# Patient Record
Sex: Male | Born: 1940
Health system: Southern US, Community
[De-identification: ages and names within clinical notes are randomized; demographics above are authoritative.]

## PROBLEM LIST (undated history)

## (undated) DIAGNOSIS — I2510 Atherosclerotic heart disease of native coronary artery without angina pectoris: Secondary | ICD-10-CM

## (undated) DIAGNOSIS — I8390 Asymptomatic varicose veins of unspecified lower extremity: Principal | ICD-10-CM

## (undated) DIAGNOSIS — N189 Chronic kidney disease, unspecified: Secondary | ICD-10-CM

## (undated) DIAGNOSIS — H9190 Unspecified hearing loss, unspecified ear: Secondary | ICD-10-CM

## (undated) DIAGNOSIS — M1990 Unspecified osteoarthritis, unspecified site: Secondary | ICD-10-CM

## (undated) DIAGNOSIS — K219 Gastro-esophageal reflux disease without esophagitis: Secondary | ICD-10-CM

## (undated) DIAGNOSIS — E785 Hyperlipidemia, unspecified: Secondary | ICD-10-CM

## (undated) DIAGNOSIS — I10 Essential (primary) hypertension: Secondary | ICD-10-CM

## (undated) DIAGNOSIS — B029 Zoster without complications: Secondary | ICD-10-CM

## (undated) DIAGNOSIS — I4891 Unspecified atrial fibrillation: Secondary | ICD-10-CM

## (undated) DIAGNOSIS — H269 Unspecified cataract: Secondary | ICD-10-CM

## (undated) DIAGNOSIS — C61 Malignant neoplasm of prostate: Secondary | ICD-10-CM

## (undated) DIAGNOSIS — Z87442 Personal history of urinary calculi: Secondary | ICD-10-CM

## (undated) DIAGNOSIS — Z951 Presence of aortocoronary bypass graft: Secondary | ICD-10-CM

## (undated) DIAGNOSIS — H409 Unspecified glaucoma: Secondary | ICD-10-CM

## (undated) DIAGNOSIS — Z972 Presence of dental prosthetic device (complete) (partial): Secondary | ICD-10-CM

## (undated) HISTORY — PX: COLONOSCOPY: SHX174

## (undated) HISTORY — PX: OTHER SURGICAL HISTORY: SHX169

## (undated) HISTORY — PX: TONSILLECTOMY: SUR1361

## (undated) HISTORY — PX: CATARACT EXTRACTION: SUR2

## (undated) HISTORY — PX: WISDOM TOOTH EXTRACTION: SHX21

## (undated) HISTORY — PX: PROSTATECTOMY: SHX69

## (undated) HISTORY — DX: Presence of aortocoronary bypass graft: Z95.1

## (undated) HISTORY — DX: Asymptomatic varicose veins of unspecified lower extremity: I83.90

---

## 1997-12-05 ENCOUNTER — Encounter: Admission: RE | Admit: 1997-12-05 | Discharge: 1998-03-05 | Payer: Self-pay | Admitting: *Deleted

## 1997-12-08 ENCOUNTER — Encounter: Admission: RE | Admit: 1997-12-08 | Discharge: 1998-03-08 | Payer: Self-pay | Admitting: *Deleted

## 1998-09-29 ENCOUNTER — Other Ambulatory Visit: Admission: RE | Admit: 1998-09-29 | Discharge: 1998-09-29 | Payer: Self-pay | Admitting: Urology

## 1998-11-27 ENCOUNTER — Encounter: Payer: Self-pay | Admitting: Urology

## 1998-11-29 ENCOUNTER — Inpatient Hospital Stay (HOSPITAL_COMMUNITY): Admission: RE | Admit: 1998-11-29 | Discharge: 1998-12-02 | Payer: Self-pay | Admitting: Urology

## 1999-11-16 ENCOUNTER — Encounter: Payer: Self-pay | Admitting: Family Medicine

## 1999-11-16 ENCOUNTER — Encounter: Admission: RE | Admit: 1999-11-16 | Discharge: 1999-11-16 | Payer: Self-pay | Admitting: Family Medicine

## 2001-07-28 ENCOUNTER — Ambulatory Visit (HOSPITAL_COMMUNITY): Admission: RE | Admit: 2001-07-28 | Discharge: 2001-07-29 | Payer: Self-pay | Admitting: Ophthalmology

## 2001-07-28 ENCOUNTER — Encounter: Payer: Self-pay | Admitting: Ophthalmology

## 2001-09-07 ENCOUNTER — Encounter: Payer: Self-pay | Admitting: Family Medicine

## 2001-09-07 ENCOUNTER — Encounter: Admission: RE | Admit: 2001-09-07 | Discharge: 2001-09-07 | Payer: Self-pay | Admitting: Family Medicine

## 2001-10-07 ENCOUNTER — Ambulatory Visit (HOSPITAL_BASED_OUTPATIENT_CLINIC_OR_DEPARTMENT_OTHER): Admission: RE | Admit: 2001-10-07 | Discharge: 2001-10-07 | Payer: Self-pay | Admitting: *Deleted

## 2003-12-29 ENCOUNTER — Encounter: Admission: RE | Admit: 2003-12-29 | Discharge: 2003-12-29 | Payer: Self-pay | Admitting: Occupational Medicine

## 2004-01-20 ENCOUNTER — Ambulatory Visit (HOSPITAL_COMMUNITY): Admission: RE | Admit: 2004-01-20 | Discharge: 2004-01-20 | Payer: Self-pay | Admitting: Ophthalmology

## 2005-05-24 ENCOUNTER — Encounter: Admission: RE | Admit: 2005-05-24 | Discharge: 2005-05-24 | Payer: Self-pay | Admitting: Family Medicine

## 2005-06-13 ENCOUNTER — Emergency Department (HOSPITAL_COMMUNITY): Admission: EM | Admit: 2005-06-13 | Discharge: 2005-06-14 | Payer: Self-pay | Admitting: Emergency Medicine

## 2013-09-30 ENCOUNTER — Encounter (HOSPITAL_COMMUNITY): Payer: Self-pay | Admitting: Emergency Medicine

## 2013-09-30 ENCOUNTER — Emergency Department (HOSPITAL_COMMUNITY): Payer: Medicare Other

## 2013-09-30 ENCOUNTER — Observation Stay (HOSPITAL_COMMUNITY)
Admission: EM | Admit: 2013-09-30 | Discharge: 2013-10-01 | Disposition: A | Discharge status: Home / Self Care | Payer: Medicare Other | Attending: Internal Medicine | Admitting: Internal Medicine

## 2013-09-30 DIAGNOSIS — B029 Zoster without complications: Secondary | ICD-10-CM | POA: Insufficient documentation

## 2013-09-30 DIAGNOSIS — K219 Gastro-esophageal reflux disease without esophagitis: Secondary | ICD-10-CM | POA: Diagnosis present

## 2013-09-30 DIAGNOSIS — Z7982 Long term (current) use of aspirin: Secondary | ICD-10-CM | POA: Insufficient documentation

## 2013-09-30 DIAGNOSIS — H409 Unspecified glaucoma: Secondary | ICD-10-CM | POA: Insufficient documentation

## 2013-09-30 DIAGNOSIS — H269 Unspecified cataract: Secondary | ICD-10-CM | POA: Diagnosis not present

## 2013-09-30 DIAGNOSIS — Z79899 Other long term (current) drug therapy: Secondary | ICD-10-CM | POA: Diagnosis not present

## 2013-09-30 DIAGNOSIS — E785 Hyperlipidemia, unspecified: Secondary | ICD-10-CM | POA: Diagnosis not present

## 2013-09-30 DIAGNOSIS — I10 Essential (primary) hypertension: Secondary | ICD-10-CM | POA: Diagnosis present

## 2013-09-30 DIAGNOSIS — R55 Syncope and collapse: Principal | ICD-10-CM | POA: Diagnosis present

## 2013-09-30 DIAGNOSIS — IMO0002 Reserved for concepts with insufficient information to code with codable children: Secondary | ICD-10-CM | POA: Insufficient documentation

## 2013-09-30 HISTORY — DX: Zoster without complications: B02.9

## 2013-09-30 HISTORY — DX: Unspecified cataract: H26.9

## 2013-09-30 HISTORY — DX: Hyperlipidemia, unspecified: E78.5

## 2013-09-30 HISTORY — DX: Gastro-esophageal reflux disease without esophagitis: K21.9

## 2013-09-30 HISTORY — DX: Essential (primary) hypertension: I10

## 2013-09-30 HISTORY — DX: Unspecified glaucoma: H40.9

## 2013-09-30 LAB — CBC WITH DIFFERENTIAL/PLATELET
BASOS ABS: 0 10*3/uL (ref 0.0–0.1)
Basophils Relative: 0 % (ref 0–1)
EOS ABS: 0.2 10*3/uL (ref 0.0–0.7)
EOS PCT: 1 % (ref 0–5)
HEMATOCRIT: 44.5 % (ref 39.0–52.0)
HEMOGLOBIN: 15.7 g/dL (ref 13.0–17.0)
LYMPHS PCT: 8 % — AB (ref 12–46)
Lymphs Abs: 1.1 10*3/uL (ref 0.7–4.0)
MCH: 33.1 pg (ref 26.0–34.0)
MCHC: 35.3 g/dL (ref 30.0–36.0)
MCV: 93.9 fL (ref 78.0–100.0)
MONO ABS: 0.8 10*3/uL (ref 0.1–1.0)
MONOS PCT: 6 % (ref 3–12)
NEUTROS ABS: 10.9 10*3/uL — AB (ref 1.7–7.7)
NEUTROS PCT: 85 % — AB (ref 43–77)
Platelets: 226 10*3/uL (ref 150–400)
RBC: 4.74 MIL/uL (ref 4.22–5.81)
RDW: 12.5 % (ref 11.5–15.5)
WBC: 12.9 10*3/uL — ABNORMAL HIGH (ref 4.0–10.5)

## 2013-09-30 LAB — COMPREHENSIVE METABOLIC PANEL
ALK PHOS: 74 U/L (ref 39–117)
ALT: 16 U/L (ref 0–53)
ANION GAP: 15 (ref 5–15)
AST: 27 U/L (ref 0–37)
Albumin: 4 g/dL (ref 3.5–5.2)
BILIRUBIN TOTAL: 0.6 mg/dL (ref 0.3–1.2)
BUN: 27 mg/dL — ABNORMAL HIGH (ref 6–23)
CALCIUM: 9.4 mg/dL (ref 8.4–10.5)
CHLORIDE: 105 meq/L (ref 96–112)
CO2: 22 mEq/L (ref 19–32)
CREATININE: 0.9 mg/dL (ref 0.50–1.35)
GFR calc Af Amer: >90 mL/min (ref >90)
GFR calc non Af Amer: 82 mL/min — ABNORMAL LOW (ref >90)
Glucose, Bld: 106 mg/dL — ABNORMAL HIGH (ref 70–99)
Potassium: 4.1 mEq/L (ref 3.7–5.3)
SODIUM: 142 meq/L (ref 137–147)
Total Protein: 7.1 g/dL (ref 6.0–8.3)

## 2013-09-30 LAB — URINE MICROSCOPIC-ADD ON

## 2013-09-30 LAB — URINALYSIS, ROUTINE W REFLEX MICROSCOPIC
GLUCOSE, UA: NEGATIVE mg/dL
Hgb urine dipstick: NEGATIVE
Ketones, ur: 15 mg/dL — AB
LEUKOCYTES UA: NEGATIVE
Nitrite: NEGATIVE
PH: 6 (ref 5.0–8.0)
Protein, ur: 100 mg/dL — AB
SPECIFIC GRAVITY, URINE: 1.022 (ref 1.005–1.030)
Urobilinogen, UA: 1 mg/dL (ref 0.0–1.0)

## 2013-09-30 LAB — PROTIME-INR
INR: 0.97 (ref 0.00–1.49)
Prothrombin Time: 12.9 seconds (ref 11.6–15.2)

## 2013-09-30 LAB — TROPONIN I

## 2013-09-30 MED ORDER — DORZOLAMIDE HCL-TIMOLOL MAL 2-0.5 % OP SOLN
1.0000 [drp] | Freq: Every day | OPHTHALMIC | Status: DC
  Administered 2013-10-01: 1 [drp] via OPHTHALMIC
  Filled 2013-09-30: qty 10

## 2013-09-30 MED ORDER — PANTOPRAZOLE SODIUM 40 MG PO TBEC
40.0000 mg | DELAYED_RELEASE_TABLET | Freq: Every day | ORAL | Status: DC
  Administered 2013-10-01: 40 mg via ORAL

## 2013-09-30 MED ORDER — ACETAMINOPHEN 650 MG RE SUPP: 650.0000 mg | Freq: Four times a day (QID) | RECTAL | Status: DC | PRN

## 2013-09-30 MED ORDER — ACETAMINOPHEN 325 MG PO TABS
650.0000 mg | ORAL_TABLET | Freq: Four times a day (QID) | ORAL | Status: DC | PRN
  Administered 2013-10-01: 650 mg via ORAL
  Filled 2013-09-30: qty 2

## 2013-09-30 MED ORDER — SIMVASTATIN 20 MG PO TABS
20.0000 mg | ORAL_TABLET | Freq: Every day | ORAL | Status: DC
  Administered 2013-09-30: 20 mg via ORAL
  Filled 2013-09-30 (×2): qty 1

## 2013-09-30 MED ORDER — ALUM & MAG HYDROXIDE-SIMETH 200-200-20 MG/5ML PO SUSP: 30.0000 mL | Freq: Four times a day (QID) | ORAL | Status: DC | PRN

## 2013-09-30 MED ORDER — ASPIRIN EC 81 MG PO TBEC
81.0000 mg | DELAYED_RELEASE_TABLET | Freq: Every day | ORAL | Status: DC
  Administered 2013-10-01: 81 mg via ORAL
  Filled 2013-09-30: qty 1

## 2013-09-30 MED ORDER — SODIUM CHLORIDE 0.9 % IJ SOLN
3.0000 mL | Freq: Two times a day (BID) | INTRAMUSCULAR | Status: DC
Start: 2013-09-30 — End: 2013-10-01
  Administered 2013-10-01: 3 mL via INTRAVENOUS

## 2013-09-30 MED ORDER — ONDANSETRON HCL 4 MG/2ML IJ SOLN: 4.0000 mg | Freq: Four times a day (QID) | INTRAMUSCULAR | Status: DC | PRN

## 2013-09-30 MED ORDER — SODIUM CHLORIDE 0.9 % IV SOLN
INTRAVENOUS | Status: DC
  Administered 2013-09-30 – 2013-10-01 (×2): via INTRAVENOUS

## 2013-09-30 MED ORDER — ONDANSETRON HCL 4 MG PO TABS: 4.0000 mg | ORAL_TABLET | Freq: Four times a day (QID) | ORAL | Status: DC | PRN

## 2013-09-30 MED ORDER — SODIUM CHLORIDE 0.9 % IV BOLUS (SEPSIS): 500.0000 mL | Freq: Once | INTRAVENOUS | Status: DC

## 2013-09-30 MED ORDER — ACETAMINOPHEN 325 MG PO TABS
650.0000 mg | ORAL_TABLET | Freq: Once | ORAL | Status: AC
  Administered 2013-09-30: 650 mg via ORAL
  Filled 2013-09-30: qty 2

## 2013-09-30 MED ORDER — OXYBUTYNIN CHLORIDE ER 5 MG PO TB24
5.0000 mg | ORAL_TABLET | Freq: Every day | ORAL | Status: DC
  Administered 2013-09-30: 5 mg via ORAL
  Filled 2013-09-30 (×2): qty 1

## 2013-09-30 MED ORDER — ENOXAPARIN SODIUM 40 MG/0.4ML ~~LOC~~ SOLN
40.0000 mg | SUBCUTANEOUS | Status: DC
  Administered 2013-09-30: 40 mg via SUBCUTANEOUS
  Filled 2013-09-30 (×2): qty 0.4

## 2013-09-30 MED ORDER — PREDNISOLONE ACETATE 1 % OP SUSP
1.0000 [drp] | Freq: Every day | OPHTHALMIC | Status: DC
  Administered 2013-10-01: 1 [drp] via OPHTHALMIC
  Filled 2013-09-30: qty 1

## 2013-09-30 MED ORDER — ONDANSETRON HCL 4 MG/2ML IJ SOLN: 4.0000 mg | Freq: Three times a day (TID) | INTRAMUSCULAR | Status: DC | PRN

## 2013-09-30 NOTE — ED Notes (Signed)
PA at bedside.

## 2013-09-30 NOTE — ED Notes (Signed)
Pt presents with report of 2 syncopal episodes prior to arrival.  Pt reports he was standing at a graveside service, became lightheaded, diaphoretic and passed out.  Pt sat down after episode, but then had another syncopal episode.  Pt denies any shortness of breath or chest discomfort before either episode.

## 2013-09-30 NOTE — H&P (Signed)
[facility] History and Physical  [name] [name] RKY:[id number] DOB: 25-Dec-1940 DOA: 09/30/2013  Referring physician: EDP PCP: Serita Grammes   Chief Complaint: passed out  HPI: [name] [name] is a 73 y.o. male  With h/o HTN presents with syncopal episode x2 today. Was at a funeral of a close friend in the heat. Was standing for about 20 minutes, started feeling hot and light headed. Lost consciousness for a brief period, regained consciousness, then had another episode soon after.  No CP, palpitations, vomiting, diarrhea, fevers, chills dysuria. Appetite good.  Workup in ED unremarkable except BUN of 27. Orthostatics, UA, CT brain unremarkable. Feels slightly weak currently.   Review of Systems:  Systems reviewed. As above, otherwise negative.  Past Medical History  Diagnosis Date  . Hypertension   . Hyperlipidemia   . Shingles   . GERD (gastroesophageal reflux disease)   . Cataracts, bilateral   . Glaucoma    Past Surgical History  Procedure Laterality Date  . Cataract extraction    . Prostatectomy     Social History:  reports that he has never smoked. He does not have any smokeless tobacco history on file. He reports that he does not drink alcohol. His drug history is not on file.  No Known Allergies  Family History  Problem Relation Age of Onset  . Ovarian cancer Mother      Prior to Admission medications   Medication Sig Start Date End Date Taking? Authorizing Provider  acyclovir (ZOVIRAX) 800 MG tablet Take 400 mg by mouth 2 (two) times daily.   Yes Historical Provider, MD  amLODipine (NORVASC) 10 MG tablet Take 10 mg by mouth daily.   Yes Historical Provider, MD  aspirin EC 81 MG tablet Take 81 mg by mouth daily.   Yes Historical Provider, MD  dorzolamide-timolol (COSOPT) 22.3-6.8 MG/ML ophthalmic solution Place 1 drop into the left eye daily.   Yes Historical Provider, MD  esomeprazole (NEXIUM) 20 MG capsule Take 20 mg by mouth daily at 12 noon.   Yes  Historical Provider, MD  lisinopril (PRINIVIL,ZESTRIL) 40 MG tablet Take 40 mg by mouth daily.   Yes Historical Provider, MD  Multiple Vitamins-Minerals (MULTIVITAMIN WITH MINERALS) tablet Take 1 tablet by mouth daily.   Yes Historical Provider, MD  oxybutynin (DITROPAN-XL) 5 MG 24 hr tablet Take 5 mg by mouth at bedtime.   Yes Historical Provider, MD  prednisoLONE acetate (PRED FORTE) 1 % ophthalmic suspension Place 1 drop into the left eye daily.   Yes Historical Provider, MD  simvastatin (ZOCOR) 40 MG tablet Take 20 mg by mouth daily.   Yes Historical Provider, MD   Physical Exam: Filed Vitals:   09/30/13 1340 09/30/13 1430 09/30/13 1500 09/30/13 1600  BP: 122/81 119/83 133/92 125/85  Pulse: 62 63 72 65  Temp:      TempSrc:      Resp: 18 24 24 20   SpO2: 95% 90% 90% 95%    Wt Readings from Last 3 Encounters:  No data found for Wt  BP 135/85  Pulse 71  Temp(Src) 98 F (36.7 C) (Oral)  Resp 19  Ht 6' (1.829 m)  Wt 81.6 kg (179 lb 14.3 oz)  BMI 24.39 kg/m2  SpO2 98%  General Appearance:    Alert, cooperative, no distress, appears stated age  Head:    Normocephalic, without obvious abnormality, atraumatic  Eyes:    PERRL, conjunctiva/corneas clear, EOM's intact,          Nose:  Nares normal, septum midline, mucosa normal, no drainage   or sinus tenderness  Throat:   Lips, mucosa, and tongue normal; teeth and gums normal  Neck:   Supple, symmetrical, trachea midline, no adenopathy;       thyroid:  No enlargement/tenderness/nodules; no carotid   bruit or JVD  Back:     Symmetric, no curvature, ROM normal, no CVA tenderness  Lungs:     Clear to auscultation bilaterally, respirations unlabored  Chest wall:    No tenderness or deformity  Heart:    Regular rate and rhythm, S1 and S2 normal, no murmur, rub   or gallop  Abdomen:     Soft, non-tender, bowel sounds active all four quadrants,    no masses, no organomegaly  Genitalia:   deferred  Rectal:   deferred  Extremities:    Extremities normal, atraumatic, no cyanosis or edema  Pulses:   2+ and symmetric all extremities  Skin:   Skin color, texture, turgor normal, no rashes or lesions  Lymph nodes:   Cervical, supraclavicular, and axillary nodes normal  Neurologic:   CNII-XII intact. Normal strength, sensation and reflexes      throughout           Psychiatric: occasionally tearful Labs on Admission:  Basic Metabolic Panel:  Recent Labs Lab 09/30/13 1439  NA 142  K 4.1  CL 105  CO2 22  GLUCOSE 106*  BUN 27*  CREATININE 0.90  CALCIUM 9.4   Liver Function Tests:  Recent Labs Lab 09/30/13 1439  AST 27  ALT 16  ALKPHOS 74  BILITOT 0.6  PROT 7.1  ALBUMIN 4.0   No results found for this basename: LIPASE, AMYLASE,  in the last 168 hours No results found for this basename: AMMONIA,  in the last 168 hours CBC:  Recent Labs Lab 09/30/13 1439  WBC 12.9*  NEUTROABS 10.9*  HGB 15.7  HCT 44.5  MCV 93.9  PLT 226   Cardiac Enzymes:  Recent Labs Lab 09/30/13 1439  TROPONINI <0.30    BNP (last 3 results) No results found for this basename: PROBNP,  in the last 8760 hours CBG: No results found for this basename: GLUCAP,  in the last 168 hours  Radiological Exams on Admission: Dg Chest 2 View  09/30/2013   CLINICAL DATA:  Syncope x2.  EXAM: CHEST  2 VIEW  COMPARISON:  CT chest 07/24/2006.  PA and lateral chest 01/19/2004  FINDINGS: Asymmetric elevation of the left hemidiaphragm relative to the right is noted. Mild basilar atelectasis is seen. Lungs are otherwise clear. No pneumothorax or pleural effusion. Heart size normal.  IMPRESSION: No acute disease.   Electronically Signed   By: Inge Rise M.D.   On: 09/30/2013 13:21   Ct Head Wo Contrast  09/30/2013   CLINICAL DATA:  Syncope.  EXAM: CT HEAD WITHOUT CONTRAST  CT CERVICAL SPINE WITHOUT CONTRAST  TECHNIQUE: Multidetector CT imaging of the head and cervical spine was performed following the standard protocol without intravenous  contrast. Multiplanar CT image reconstructions of the cervical spine were also generated.  COMPARISON:  MRI 05/24/2005.  FINDINGS: CT HEAD FINDINGS  No intra-axial or extra-axial pathologic fluid or blood collection identified. No mass lesion. No hydrocephalus. Diffuse mild atrophy. White matter changes suggesting chronic ischemia noted. Left ocular prosthesis. No acute sinus disease. Mastoids are clear. No acute bony abnormality.  CT CERVICAL SPINE FINDINGS  No soft tissue swelling. Carotid atherosclerotic vascular disease. Biapical pulmonary pleural parenchymal thickening consistent with scarring.  Diffuse severe degenerative change. No evidence of fracture dislocation. Minimal C3 on C4 and C5 on C6 anterior subluxation noted. This is most likely degenerative. Ligamentous injury cannot be excluded.  IMPRESSION: 1. No acute intracranial abnormality. 2. Severe cervical spine degenerative change. No evidence of fracture dislocation. Minimal C3 on C4 and C5 on C6 anterior subluxation. This is most likely degenerative. Ligamentous injury cannot be excluded.   Electronically Signed   By: Marcello Moores  Register   On: 09/30/2013 13:25   Ct Cervical Spine Wo Contrast  09/30/2013   CLINICAL DATA:  Syncope.  EXAM: CT HEAD WITHOUT CONTRAST  CT CERVICAL SPINE WITHOUT CONTRAST  TECHNIQUE: Multidetector CT imaging of the head and cervical spine was performed following the standard protocol without intravenous contrast. Multiplanar CT image reconstructions of the cervical spine were also generated.  COMPARISON:  MRI 05/24/2005.  FINDINGS: CT HEAD FINDINGS  No intra-axial or extra-axial pathologic fluid or blood collection identified. No mass lesion. No hydrocephalus. Diffuse mild atrophy. White matter changes suggesting chronic ischemia noted. Left ocular prosthesis. No acute sinus disease. Mastoids are clear. No acute bony abnormality.  CT CERVICAL SPINE FINDINGS  No soft tissue swelling. Carotid atherosclerotic vascular disease.  Biapical pulmonary pleural parenchymal thickening consistent with scarring. Diffuse severe degenerative change. No evidence of fracture dislocation. Minimal C3 on C4 and C5 on C6 anterior subluxation noted. This is most likely degenerative. Ligamentous injury cannot be excluded.  IMPRESSION: 1. No acute intracranial abnormality. 2. Severe cervical spine degenerative change. No evidence of fracture dislocation. Minimal C3 on C4 and C5 on C6 anterior subluxation. This is most likely degenerative. Ligamentous injury cannot be excluded.   Electronically Signed   By: Marcello Moores  Register   On: 09/30/2013 13:25    EKG: Sinus rhythm LAD, consider left anterior fascicular block  Assessment/Plan Principal Problem:   Syncope: likely heat related vs vasovagal. Observation overnight on tele. IVF. Hold antihypertensives. Active Problems:   Essential hypertension, benign   GERD (gastroesophageal reflux disease)  Code Status: full  Time spent: 60 min  [facility] Pager [phone]

## 2013-09-30 NOTE — ED Notes (Signed)
MD at bedside. Admitting  

## 2013-09-30 NOTE — ED Provider Notes (Signed)
CSN: [id number]     Arrival date & time 09/30/13  1229 History   First MD Initiated Contact with Patient 09/30/13 1249     (Consider location/radiation/quality/duration/timing/severity/associated sxs/prior Treatment)  HPI  [name] [name] is a 73 y.o. male past medical history significant for hypertension hyperlipidemia complaining of 2 syncopal events at approximately 11:30 AM this morning. Patient was in a great side funeral service, he began to fill lightheaded and woozy he leaned against a great stone and when he was going from a sitting to standing position of the presyncopal sensation. He sat on top of the great stone and when he went to stand up again he fell backwards over the great stone hitting his head. There was no tonic-clonic movements, no loss of bowel or bladder control. Patient was helped to a CT scan he syncopized again. As per patient's wife he was dazed for a moment and then return to normal quickly. He has no history of seizure disorder. Patient thinks that he passed out because it was very hot at the surface. States he was in his normal state of health and had been eating and drinking well over the course of last day. He denies any fever, chills, chest pain, shortness of breath, abdominal pain, vomiting, change in bowel or bladder habits. Denies history of CHF or cardiac issues.  Past Medical History  Diagnosis Date  . Hypertension   . Hyperlipidemia   . Shingles    History reviewed. No pertinent past surgical history. History reviewed. No pertinent family history. History  Substance Use Topics  . Smoking status: Never Smoker   . Smokeless tobacco: Not on file  . Alcohol Use: No    Review of Systems  10 systems reviewed and found to be negative, except as noted in the HPI.   Allergies  Review of patient's allergies indicates no known allergies.  Home Medications   Prior to Admission medications   Medication Sig Start Date End Date Taking? Authorizing Provider   acyclovir (ZOVIRAX) 800 MG tablet Take 400 mg by mouth 2 (two) times daily.   Yes Historical Provider, MD  amLODipine (NORVASC) 10 MG tablet Take 10 mg by mouth daily.   Yes Historical Provider, MD  aspirin EC 81 MG tablet Take 81 mg by mouth daily.   Yes Historical Provider, MD  dorzolamide-timolol (COSOPT) 22.3-6.8 MG/ML ophthalmic solution Place 1 drop into the left eye daily.   Yes Historical Provider, MD  esomeprazole (NEXIUM) 20 MG capsule Take 20 mg by mouth daily at 12 noon.   Yes Historical Provider, MD  lisinopril (PRINIVIL,ZESTRIL) 40 MG tablet Take 40 mg by mouth daily.   Yes Historical Provider, MD  Multiple Vitamins-Minerals (MULTIVITAMIN WITH MINERALS) tablet Take 1 tablet by mouth daily.   Yes Historical Provider, MD  oxybutynin (DITROPAN-XL) 5 MG 24 hr tablet Take 5 mg by mouth at bedtime.   Yes Historical Provider, MD  prednisoLONE acetate (PRED FORTE) 1 % ophthalmic suspension Place 1 drop into the left eye daily.   Yes Historical Provider, MD  simvastatin (ZOCOR) 40 MG tablet Take 20 mg by mouth daily.   Yes Historical Provider, MD   BP 133/92  Pulse 72  Temp(Src) 97.6 F (36.4 C) (Oral)  Resp 24  SpO2 90% Physical Exam  Nursing note and vitals reviewed. Constitutional: He is oriented to person, place, and time. He appears well-developed and well-nourished. No distress.  HENT:  Head: Normocephalic and atraumatic.  Mouth/Throat: Oropharynx is clear and moist.  Faint  abrasion to right forehead.   No hemotympanum, battle signs or raccoon's eyes  No crepitance or tenderness to palpation along the orbital rim.  EOMI intact with no pain or diplopia  No abnormal otorrhea or rhinorrhea. Nasal septum midline.  No intraoral trauma.   Eyes: Conjunctivae and EOM are normal. Pupils are equal, round, and reactive to light.  Neck: Normal range of motion. Neck supple.  No midline C-spine  tenderness to palpation or step-offs appreciated. Patient has full range of motion  without pain.   Cardiovascular: Normal rate, regular rhythm and intact distal pulses.   No carotid bruits appreciated  Pulmonary/Chest: Effort normal and breath sounds normal. No stridor. No respiratory distress. He has no wheezes. He has no rales. He exhibits no tenderness.  No TTP or crepitance  Abdominal: Soft. Bowel sounds are normal. He exhibits no distension and no mass. There is no tenderness. There is no rebound and no guarding.  Musculoskeletal: Normal range of motion. He exhibits no edema and no tenderness.  Right Shoulder with no deformity. FROM to shoulder and elbow. No TTP of rotator cuff musculature. Drop arm negative. Neurovascularly intact  Pelvis stable. No deformity or TTP of major joints.     Neurological: He is alert and oriented to person, place, and time.  Strength 5/5 x4 extremities   Distal sensation intact  Skin: Skin is warm.  Psychiatric: He has a normal mood and affect.    ED Course  Procedures (including critical care time) Labs Review Labs Reviewed  CBC WITH DIFFERENTIAL - Abnormal; Notable for the following:    WBC 12.9 (*)    Neutrophils Relative % 85 (*)    Neutro Abs 10.9 (*)    Lymphocytes Relative 8 (*)    All other components within normal limits  COMPREHENSIVE METABOLIC PANEL - Abnormal; Notable for the following:    Glucose, Bld 106 (*)    BUN 27 (*)    GFR calc non Af Amer 82 (*)    All other components within normal limits  URINALYSIS, ROUTINE W REFLEX MICROSCOPIC - Abnormal; Notable for the following:    Color, Urine AMBER (*)    APPearance CLOUDY (*)    Bilirubin Urine SMALL (*)    Ketones, ur 15 (*)    Protein, ur 100 (*)    All other components within normal limits  URINE MICROSCOPIC-ADD ON - Abnormal; Notable for the following:    Bacteria, UA FEW (*)    All other components within normal limits  PROTIME-INR  TROPONIN I  POCT CBG (FASTING - GLUCOSE)-MANUAL ENTRY    Imaging Review Dg Chest 2 View  09/30/2013   CLINICAL  DATA:  Syncope x2.  EXAM: CHEST  2 VIEW  COMPARISON:  CT chest 07/24/2006.  PA and lateral chest 01/19/2004  FINDINGS: Asymmetric elevation of the left hemidiaphragm relative to the right is noted. Mild basilar atelectasis is seen. Lungs are otherwise clear. No pneumothorax or pleural effusion. Heart size normal.  IMPRESSION: No acute disease.   Electronically Signed   By: Inge Rise M.D.   On: 09/30/2013 13:21   Ct Head Wo Contrast  09/30/2013   CLINICAL DATA:  Syncope.  EXAM: CT HEAD WITHOUT CONTRAST  CT CERVICAL SPINE WITHOUT CONTRAST  TECHNIQUE: Multidetector CT imaging of the head and cervical spine was performed following the standard protocol without intravenous contrast. Multiplanar CT image reconstructions of the cervical spine were also generated.  COMPARISON:  MRI 05/24/2005.  FINDINGS: CT HEAD FINDINGS  No  intra-axial or extra-axial pathologic fluid or blood collection identified. No mass lesion. No hydrocephalus. Diffuse mild atrophy. White matter changes suggesting chronic ischemia noted. Left ocular prosthesis. No acute sinus disease. Mastoids are clear. No acute bony abnormality.  CT CERVICAL SPINE FINDINGS  No soft tissue swelling. Carotid atherosclerotic vascular disease. Biapical pulmonary pleural parenchymal thickening consistent with scarring. Diffuse severe degenerative change. No evidence of fracture dislocation. Minimal C3 on C4 and C5 on C6 anterior subluxation noted. This is most likely degenerative. Ligamentous injury cannot be excluded.  IMPRESSION: 1. No acute intracranial abnormality. 2. Severe cervical spine degenerative change. No evidence of fracture dislocation. Minimal C3 on C4 and C5 on C6 anterior subluxation. This is most likely degenerative. Ligamentous injury cannot be excluded.   Electronically Signed   By: Marcello Moores  Register   On: 09/30/2013 13:25   Ct Cervical Spine Wo Contrast  09/30/2013   CLINICAL DATA:  Syncope.  EXAM: CT HEAD WITHOUT CONTRAST  CT CERVICAL  SPINE WITHOUT CONTRAST  TECHNIQUE: Multidetector CT imaging of the head and cervical spine was performed following the standard protocol without intravenous contrast. Multiplanar CT image reconstructions of the cervical spine were also generated.  COMPARISON:  MRI 05/24/2005.  FINDINGS: CT HEAD FINDINGS  No intra-axial or extra-axial pathologic fluid or blood collection identified. No mass lesion. No hydrocephalus. Diffuse mild atrophy. White matter changes suggesting chronic ischemia noted. Left ocular prosthesis. No acute sinus disease. Mastoids are clear. No acute bony abnormality.  CT CERVICAL SPINE FINDINGS  No soft tissue swelling. Carotid atherosclerotic vascular disease. Biapical pulmonary pleural parenchymal thickening consistent with scarring. Diffuse severe degenerative change. No evidence of fracture dislocation. Minimal C3 on C4 and C5 on C6 anterior subluxation noted. This is most likely degenerative. Ligamentous injury cannot be excluded.  IMPRESSION: 1. No acute intracranial abnormality. 2. Severe cervical spine degenerative change. No evidence of fracture dislocation. Minimal C3 on C4 and C5 on C6 anterior subluxation. This is most likely degenerative. Ligamentous injury cannot be excluded.   Electronically Signed   By: Marcello Moores  Register   On: 09/30/2013 13:25     EKG Interpretation   Date/Time:  Thursday September 30 2013 12:41:33 EDT Ventricular Rate:  59 PR Interval:  193 QRS Duration: 102 QT Interval:  417 QTC Calculation: 413 R Axis:   -52 Text Interpretation:  Sinus rhythm LAD, consider left anterior fascicular  block compared to prior, axis slightly more left Confirmed by [facility]  MD,  TREY ([id number]) on 09/30/2013 3:53:54 PM      MDM   Final diagnoses:  Syncope and collapse    Filed Vitals:   09/30/13 1244 09/30/13 1340 09/30/13 1430 09/30/13 1500  BP: 113/73 122/81 119/83 133/92  Pulse: 60 62 63 72  Temp: 97.6 F (36.4 C)     TempSrc: Oral     Resp: 18 18 24 24    SpO2: 97% 95% 90% 90%    Medications  sodium chloride 0.9 % bolus 500 mL (not administered)  ondansetron (ZOFRAN) injection 4 mg (not administered)  acetaminophen (TYLENOL) tablet 650 mg (650 mg Oral Given 09/30/13 1508)    [name] [name] is a 73 y.o. male presenting with 2 syncopal events earlier in the day. One occurred while the patient was sitting down. Patient denies any cardiac history. EKG with left anterior fascicular block. Negative troponin, blood work with no gross abnormality.  Chest x-ray, head CT without acute abnormality. Cervical spine CT shows severe C-spine degenerative changes. He states that ligamentous injury cannot be  excluded however, patient has no midline tenderness palpation I doubt this is clinically relevant. During the patient's age, 2 episodes of syncope, one while sitting it is reasonable to admit him for observation. Discussed case with attending who is personally seen and evaluated the patient and agrees with plan. Case discussed with [facility] hospital[facility] Dr. Conley Canal who will admit to a telemetry bed.   Monico Blitz, PA-C 09/30/13 1620

## 2013-09-30 NOTE — ED Notes (Signed)
Denies chest pain 

## 2013-09-30 NOTE — ED Provider Notes (Signed)
Medical screening examination/treatment/procedure(s) were conducted as a shared visit with non-physician practitioner(s) and myself.  I personally evaluated the patient during the encounter.   EKG Interpretation   Date/Time:  Thursday September 30 2013 12:41:33 EDT Ventricular Rate:  59 PR Interval:  193 QRS Duration: 102 QT Interval:  417 QTC Calculation: 413 R Axis:   -52 Text Interpretation:  Sinus rhythm LAD, consider left anterior fascicular  block compared to prior, axis slightly more left Confirmed by [facility]  MD,  TREY ([id number]) on 09/30/2013 3:53:40 PM      73 year old male presenting with syncope. He was outside at a funeral, when he began feeling lightheaded. He went to stand next to a grave marker, but several minutes later he remained dizzy and actually passed out. He fell over the great but does not appear to have sustained any significant injuries. A few minutes later, while sitting in a chair, he passed out again for a brief period of time.  On exam, well appearing, nontoxic, not distressed, normal respiratory effort, normal perfusion, heart sounds normal, no murmurs rubs or gallops, lungs clear to auscultation bilaterally.  Plan admit for syncope workup.  Clinical Impression: 1. Syncope and collapse       Houston Siren III, MD 09/30/13 [phone]

## 2013-10-01 DIAGNOSIS — I10 Essential (primary) hypertension: Secondary | ICD-10-CM

## 2013-10-01 LAB — CBC
HEMATOCRIT: 37.9 % — AB (ref 39.0–52.0)
Hemoglobin: 13.4 g/dL (ref 13.0–17.0)
MCH: 32.8 pg (ref 26.0–34.0)
MCHC: 35.4 g/dL (ref 30.0–36.0)
MCV: 92.7 fL (ref 78.0–100.0)
PLATELETS: 207 10*3/uL (ref 150–400)
RBC: 4.09 MIL/uL — ABNORMAL LOW (ref 4.22–5.81)
RDW: 12.5 % (ref 11.5–15.5)
WBC: 7.3 10*3/uL (ref 4.0–10.5)

## 2013-10-01 LAB — GLUCOSE, CAPILLARY: Glucose-Capillary: 103 mg/dL — ABNORMAL HIGH (ref 70–99)

## 2013-10-01 MED ORDER — LISINOPRIL 40 MG PO TABS
40.0000 mg | ORAL_TABLET | Freq: Every day | ORAL | Status: DC
  Administered 2013-10-01: 40 mg via ORAL
  Filled 2013-10-01: qty 1

## 2013-10-01 NOTE — Progress Notes (Signed)
Utilization review completed.  

## 2013-10-01 NOTE — Discharge Summary (Signed)
Physician Discharge Summary  [name] [name] MGN:[id number] DOB: 07-06-1940 DOA: 09/30/2013  PCP: [name], MD  Admit date: 09/30/2013 Discharge date: 10/01/2013  Time spent: 35  minutes  Recommendations for Outpatient Follow-up:  1.  may need resumption of norvasc  Discharge Diagnoses:  Principal Problem:   Syncope Active Problems:   Essential hypertension, benign   GERD (gastroesophageal reflux disease)   Discharge Condition: improved  Diet recommendation: cardiac  Filed Weights   09/30/13 1718  Weight: 81.6 kg (179 lb 14.3 oz)    History of present illness:  [name] [name] is a 73 y.o. male  With h/o HTN presents with syncopal episode x2 today. Was at a funeral of a close friend in the heat. Was standing for about 20 minutes, started feeling hot and light headed. Lost consciousness for a brief period, regained consciousness, then had another episode soon after. No CP, palpitations, vomiting, diarrhea, fevers, chills dysuria. Appetite good. Workup in ED unremarkable except BUN of 27. Orthostatics, UA, CT brain unremarkable. Feels slightly weak currently   Hospital Course:  Syncope: likely heat related  -patient improved after hydration -tele ok  Procedures:    Consultations:    Discharge Exam: Filed Vitals:   10/01/13 0547  BP: 131/79  Pulse: 92  Temp: 98 F (36.7 C)  Resp: 20    General: up walking around, asking to go home Cardiovascular: rrr Respiratory: clear  Discharge Instructions You were cared for by a hospitalist during your hospital stay. If you have any questions about your discharge medications or the care you received while you were in the hospital after you are discharged, you can call the unit and asked to speak with the hospitalist on call if the hospitalist that took care of you is not available. Once you are discharged, your primary care physician will handle any further medical issues. Please note that NO REFILLS for any discharge  medications will be authorized once you are discharged, as it is imperative that you return to your primary care physician (or establish a relationship with a primary care physician if you do not have one) for your aftercare needs so that they can reassess your need for medications and monitor your lab values.  Discharge Instructions   Diet - low sodium heart healthy    Complete by:  As directed      Discharge instructions    Complete by:  As directed   Avoid being in the sun Drink plenty of fluids Hold norvasc until BP check by PCP-     Increase activity slowly    Complete by:  As directed           Current Discharge Medication List    CONTINUE these medications which have NOT CHANGED   Details  acyclovir (ZOVIRAX) 800 MG tablet Take 400 mg by mouth 2 (two) times daily.    aspirin EC 81 MG tablet Take 81 mg by mouth daily.    dorzolamide-timolol (COSOPT) 22.3-6.8 MG/ML ophthalmic solution Place 1 drop into the left eye daily.    esomeprazole (NEXIUM) 20 MG capsule Take 20 mg by mouth daily at 12 noon.    lisinopril (PRINIVIL,ZESTRIL) 40 MG tablet Take 40 mg by mouth daily.    Multiple Vitamins-Minerals (MULTIVITAMIN WITH MINERALS) tablet Take 1 tablet by mouth daily.    oxybutynin (DITROPAN-XL) 5 MG 24 hr tablet Take 5 mg by mouth at bedtime.    prednisoLONE acetate (PRED FORTE) 1 % ophthalmic suspension Place 1 drop into the left  eye daily.    simvastatin (ZOCOR) 40 MG tablet Take 20 mg by mouth daily.      STOP taking these medications     amLODipine (NORVASC) 10 MG tablet        No Known Allergies Follow-up Information   Follow up with SHAW,KIMBERLEE, MD In 1 week. (for BP check- may need resumption of norvasc)    Specialty:  Family Medicine   Contact information:   [address] [place] [address] [phone]        The results of significant diagnostics from this hospitalization (including imaging, microbiology, ancillary and  laboratory) are listed below for reference.    Significant Diagnostic Studies: Dg Chest 2 View  09/30/2013   CLINICAL DATA:  Syncope x2.  EXAM: CHEST  2 VIEW  COMPARISON:  CT chest 07/24/2006.  PA and lateral chest 01/19/2004  FINDINGS: Asymmetric elevation of the left hemidiaphragm relative to the right is noted. Mild basilar atelectasis is seen. Lungs are otherwise clear. No pneumothorax or pleural effusion. Heart size normal.  IMPRESSION: No acute disease.   Electronically Signed   By: Inge Rise M.D.   On: 09/30/2013 13:21   Ct Head Wo Contrast  09/30/2013   CLINICAL DATA:  Syncope.  EXAM: CT HEAD WITHOUT CONTRAST  CT CERVICAL SPINE WITHOUT CONTRAST  TECHNIQUE: Multidetector CT imaging of the head and cervical spine was performed following the standard protocol without intravenous contrast. Multiplanar CT image reconstructions of the cervical spine were also generated.  COMPARISON:  MRI 05/24/2005.  FINDINGS: CT HEAD FINDINGS  No intra-axial or extra-axial pathologic fluid or blood collection identified. No mass lesion. No hydrocephalus. Diffuse mild atrophy. White matter changes suggesting chronic ischemia noted. Left ocular prosthesis. No acute sinus disease. Mastoids are clear. No acute bony abnormality.  CT CERVICAL SPINE FINDINGS  No soft tissue swelling. Carotid atherosclerotic vascular disease. Biapical pulmonary pleural parenchymal thickening consistent with scarring. Diffuse severe degenerative change. No evidence of fracture dislocation. Minimal C3 on C4 and C5 on C6 anterior subluxation noted. This is most likely degenerative. Ligamentous injury cannot be excluded.  IMPRESSION: 1. No acute intracranial abnormality. 2. Severe cervical spine degenerative change. No evidence of fracture dislocation. Minimal C3 on C4 and C5 on C6 anterior subluxation. This is most likely degenerative. Ligamentous injury cannot be excluded.   Electronically Signed   By: Marcello Moores  Register   On: 09/30/2013 13:25    Ct Cervical Spine Wo Contrast  09/30/2013   CLINICAL DATA:  Syncope.  EXAM: CT HEAD WITHOUT CONTRAST  CT CERVICAL SPINE WITHOUT CONTRAST  TECHNIQUE: Multidetector CT imaging of the head and cervical spine was performed following the standard protocol without intravenous contrast. Multiplanar CT image reconstructions of the cervical spine were also generated.  COMPARISON:  MRI 05/24/2005.  FINDINGS: CT HEAD FINDINGS  No intra-axial or extra-axial pathologic fluid or blood collection identified. No mass lesion. No hydrocephalus. Diffuse mild atrophy. White matter changes suggesting chronic ischemia noted. Left ocular prosthesis. No acute sinus disease. Mastoids are clear. No acute bony abnormality.  CT CERVICAL SPINE FINDINGS  No soft tissue swelling. Carotid atherosclerotic vascular disease. Biapical pulmonary pleural parenchymal thickening consistent with scarring. Diffuse severe degenerative change. No evidence of fracture dislocation. Minimal C3 on C4 and C5 on C6 anterior subluxation noted. This is most likely degenerative. Ligamentous injury cannot be excluded.  IMPRESSION: 1. No acute intracranial abnormality. 2. Severe cervical spine degenerative change. No evidence of fracture dislocation. Minimal C3 on C4 and C5  on C6 anterior subluxation. This is most likely degenerative. Ligamentous injury cannot be excluded.   Electronically Signed   By: Marcello Moores  Register   On: 09/30/2013 13:25    Microbiology: No results found for this or any previous visit (from the past 240 hour(s)).   Labs: Basic Metabolic Panel:  Recent Labs Lab 09/30/13 1439  NA 142  K 4.1  CL 105  CO2 22  GLUCOSE 106*  BUN 27*  CREATININE 0.90  CALCIUM 9.4   Liver Function Tests:  Recent Labs Lab 09/30/13 1439  AST 27  ALT 16  ALKPHOS 74  BILITOT 0.6  PROT 7.1  ALBUMIN 4.0   No results found for this basename: LIPASE, AMYLASE,  in the last 168 hours No results found for this basename: AMMONIA,  in the last 168  hours CBC:  Recent Labs Lab 09/30/13 1439 10/01/13 0500  WBC 12.9* 7.3  NEUTROABS 10.9*  --   HGB 15.7 13.4  HCT 44.5 37.9*  MCV 93.9 92.7  PLT 226 207   Cardiac Enzymes:  Recent Labs Lab 09/30/13 1439  TROPONINI <0.30   BNP: BNP (last 3 results) No results found for this basename: PROBNP,  in the last 8760 hours CBG:  Recent Labs Lab 09/30/13 1324  GLUCAP 103*       Signed:  Charmel Pronovost  [facility] 10/01/2013, 1:31 PM

## 2013-10-04 NOTE — ED Provider Notes (Signed)
Medical screening examination/treatment/procedure(s) were conducted as a shared visit with non-physician practitioner(s) and myself.  I personally evaluated the patient during the encounter.   EKG Interpretation   Date/Time:  Thursday September 30 2013 12:41:33 EDT Ventricular Rate:  59 PR Interval:  193 QRS Duration: 102 QT Interval:  417 QTC Calculation: 413 R Axis:   -52 Text Interpretation:  Sinus rhythm LAD, consider left anterior fascicular  block compared to prior, axis slightly more left Confirmed by [facility]  MD,  TREY ([id number]) on 09/30/2013 3:53:54 PM        Jenny Reichmann Elba Barman III, MD 10/04/13 1517

## 2013-11-28 ENCOUNTER — Encounter: Payer: Self-pay | Admitting: *Deleted

## 2014-09-23 ENCOUNTER — Encounter (HOSPITAL_COMMUNITY): Payer: Self-pay | Admitting: *Deleted

## 2014-09-23 ENCOUNTER — Inpatient Hospital Stay (HOSPITAL_COMMUNITY)
Admission: EM | Admit: 2014-09-23 | Discharge: 2014-10-04 | DRG: 217 | Disposition: A | Discharge status: Home / Self Care | Payer: Medicare Other | Attending: Thoracic Surgery (Cardiothoracic Vascular Surgery) | Admitting: Thoracic Surgery (Cardiothoracic Vascular Surgery)

## 2014-09-23 ENCOUNTER — Emergency Department (HOSPITAL_COMMUNITY): Payer: Medicare Other

## 2014-09-23 ENCOUNTER — Encounter (HOSPITAL_COMMUNITY)
Admission: EM | Disposition: A | Discharge status: Home / Self Care | Payer: Self-pay | Source: Home / Self Care | Attending: Thoracic Surgery (Cardiothoracic Vascular Surgery)

## 2014-09-23 DIAGNOSIS — I200 Unstable angina: Secondary | ICD-10-CM | POA: Diagnosis not present

## 2014-09-23 DIAGNOSIS — Z87891 Personal history of nicotine dependence: Secondary | ICD-10-CM

## 2014-09-23 DIAGNOSIS — D696 Thrombocytopenia, unspecified: Secondary | ICD-10-CM | POA: Diagnosis not present

## 2014-09-23 DIAGNOSIS — I4891 Unspecified atrial fibrillation: Secondary | ICD-10-CM | POA: Diagnosis present

## 2014-09-23 DIAGNOSIS — K089 Disorder of teeth and supporting structures, unspecified: Secondary | ICD-10-CM

## 2014-09-23 DIAGNOSIS — I471 Supraventricular tachycardia: Secondary | ICD-10-CM | POA: Diagnosis not present

## 2014-09-23 DIAGNOSIS — I25110 Atherosclerotic heart disease of native coronary artery with unstable angina pectoris: Secondary | ICD-10-CM

## 2014-09-23 DIAGNOSIS — K5900 Constipation, unspecified: Secondary | ICD-10-CM | POA: Diagnosis present

## 2014-09-23 DIAGNOSIS — I214 Non-ST elevation (NSTEMI) myocardial infarction: Secondary | ICD-10-CM | POA: Diagnosis not present

## 2014-09-23 DIAGNOSIS — Z7982 Long term (current) use of aspirin: Secondary | ICD-10-CM

## 2014-09-23 DIAGNOSIS — R001 Bradycardia, unspecified: Secondary | ICD-10-CM

## 2014-09-23 DIAGNOSIS — E785 Hyperlipidemia, unspecified: Secondary | ICD-10-CM | POA: Diagnosis present

## 2014-09-23 DIAGNOSIS — K219 Gastro-esophageal reflux disease without esophagitis: Secondary | ICD-10-CM | POA: Diagnosis present

## 2014-09-23 DIAGNOSIS — I10 Essential (primary) hypertension: Secondary | ICD-10-CM | POA: Diagnosis present

## 2014-09-23 DIAGNOSIS — Z951 Presence of aortocoronary bypass graft: Secondary | ICD-10-CM

## 2014-09-23 DIAGNOSIS — I350 Nonrheumatic aortic (valve) stenosis: Secondary | ICD-10-CM | POA: Diagnosis present

## 2014-09-23 DIAGNOSIS — R079 Chest pain, unspecified: Secondary | ICD-10-CM

## 2014-09-23 DIAGNOSIS — K0530 Chronic periodontitis, unspecified: Secondary | ICD-10-CM | POA: Diagnosis present

## 2014-09-23 DIAGNOSIS — R011 Cardiac murmur, unspecified: Secondary | ICD-10-CM

## 2014-09-23 DIAGNOSIS — H409 Unspecified glaucoma: Secondary | ICD-10-CM | POA: Diagnosis present

## 2014-09-23 DIAGNOSIS — J9811 Atelectasis: Secondary | ICD-10-CM | POA: Diagnosis not present

## 2014-09-23 DIAGNOSIS — D62 Acute posthemorrhagic anemia: Secondary | ICD-10-CM | POA: Diagnosis not present

## 2014-09-23 DIAGNOSIS — Z79899 Other long term (current) drug therapy: Secondary | ICD-10-CM

## 2014-09-23 HISTORY — PX: CARDIAC CATHETERIZATION: SHX172

## 2014-09-23 LAB — CBC
HEMATOCRIT: 45.3 % (ref 39.0–52.0)
Hemoglobin: 15.9 g/dL (ref 13.0–17.0)
MCH: 32.3 pg (ref 26.0–34.0)
MCHC: 35.1 g/dL (ref 30.0–36.0)
MCV: 91.9 fL (ref 78.0–100.0)
Platelets: 210 10*3/uL (ref 150–400)
RBC: 4.93 MIL/uL (ref 4.22–5.81)
RDW: 12.8 % (ref 11.5–15.5)
WBC: 7.5 10*3/uL (ref 4.0–10.5)

## 2014-09-23 LAB — SURGICAL PCR SCREEN
MRSA, PCR: NEGATIVE
Staphylococcus aureus: NEGATIVE

## 2014-09-23 LAB — BASIC METABOLIC PANEL
ANION GAP: 8 (ref 5–15)
BUN: 18 mg/dL (ref 6–20)
CHLORIDE: 108 mmol/L (ref 101–111)
CO2: 21 mmol/L — AB (ref 22–32)
Calcium: 9.4 mg/dL (ref 8.9–10.3)
Creatinine, Ser: 0.9 mg/dL (ref 0.61–1.24)
GFR calc Af Amer: >60 mL/min (ref >60)
GLUCOSE: 110 mg/dL — AB (ref 65–99)
POTASSIUM: 4 mmol/L (ref 3.5–5.1)
Sodium: 137 mmol/L (ref 135–145)

## 2014-09-23 LAB — TROPONIN I
TROPONIN I: 0.57 ng/mL — AB (ref <0.031)
Troponin I: 0.43 ng/mL — ABNORMAL HIGH (ref <0.031)

## 2014-09-23 LAB — I-STAT TROPONIN, ED: Troponin i, poc: 0.06 ng/mL (ref 0.00–0.08)

## 2014-09-23 SURGERY — LEFT HEART CATH AND CORONARY ANGIOGRAPHY
Anesthesia: LOCAL

## 2014-09-23 MED ORDER — NITROGLYCERIN 0.4 MG SL SUBL
0.4000 mg | SUBLINGUAL_TABLET | SUBLINGUAL | Status: DC | PRN
  Administered 2014-09-27 (×3): 0.4 mg via SUBLINGUAL
  Filled 2014-09-23: qty 1

## 2014-09-23 MED ORDER — PREDNISOLONE ACETATE 1 % OP SUSP
1.0000 [drp] | Freq: Every day | OPHTHALMIC | Status: DC
  Administered 2014-09-23 – 2014-10-04 (×11): 1 [drp] via OPHTHALMIC
  Filled 2014-09-23 (×3): qty 1

## 2014-09-23 MED ORDER — HEPARIN (PORCINE) IN NACL 100-0.45 UNIT/ML-% IJ SOLN
1150.0000 [IU]/h | INTRAMUSCULAR | Status: DC
  Administered 2014-09-23: 1050 [IU]/h via INTRAVENOUS
  Administered 2014-09-24: 1250 [IU]/h via INTRAVENOUS
  Administered 2014-09-25: 1100 [IU]/h via INTRAVENOUS
  Administered 2014-09-26 – 2014-09-27 (×2): 1150 [IU]/h via INTRAVENOUS
  Filled 2014-09-23 (×7): qty 250

## 2014-09-23 MED ORDER — SODIUM CHLORIDE 0.9 % IV SOLN: INTRAVENOUS | Status: DC

## 2014-09-23 MED ORDER — SODIUM CHLORIDE 0.9 % IJ SOLN
3.0000 mL | Freq: Two times a day (BID) | INTRAMUSCULAR | Status: DC
  Administered 2014-09-23 – 2014-09-27 (×7): 3 mL via INTRAVENOUS

## 2014-09-23 MED ORDER — ATROPINE SULFATE 0.1 MG/ML IJ SOLN
INTRAMUSCULAR | Status: DC | PRN
  Administered 2014-09-23: 1 mg via INTRAVENOUS

## 2014-09-23 MED ORDER — SODIUM CHLORIDE 0.9 % IV SOLN: 250.0000 mL | INTRAVENOUS | Status: DC | PRN

## 2014-09-23 MED ORDER — ATROPINE SULFATE 0.1 MG/ML IJ SOLN
INTRAMUSCULAR | Status: AC
  Filled 2014-09-23: qty 10

## 2014-09-23 MED ORDER — DOPAMINE-DEXTROSE 3.2-5 MG/ML-% IV SOLN
INTRAVENOUS | Status: AC
  Filled 2014-09-23: qty 250

## 2014-09-23 MED ORDER — ASPIRIN EC 81 MG PO TBEC
81.0000 mg | DELAYED_RELEASE_TABLET | Freq: Every day | ORAL | Status: DC
  Administered 2014-09-24 – 2014-09-27 (×4): 81 mg via ORAL
  Filled 2014-09-23 (×8): qty 1

## 2014-09-23 MED ORDER — ONDANSETRON HCL 4 MG/2ML IJ SOLN: 4.0000 mg | Freq: Four times a day (QID) | INTRAMUSCULAR | Status: DC | PRN

## 2014-09-23 MED ORDER — ACYCLOVIR 400 MG PO TABS
400.0000 mg | ORAL_TABLET | Freq: Two times a day (BID) | ORAL | Status: DC
  Administered 2014-09-23 – 2014-10-04 (×20): 400 mg via ORAL
  Filled 2014-09-23 (×24): qty 1

## 2014-09-23 MED ORDER — LIDOCAINE HCL (PF) 1 % IJ SOLN
INTRAMUSCULAR | Status: AC
  Filled 2014-09-23: qty 30

## 2014-09-23 MED ORDER — LIDOCAINE HCL (PF) 1 % IJ SOLN
INTRAMUSCULAR | Status: DC | PRN
  Administered 2014-09-23: 16:00:00

## 2014-09-23 MED ORDER — TIMOLOL MALEATE 0.5 % OP SOLN
1.0000 [drp] | Freq: Every day | OPHTHALMIC | Status: DC
Start: 2014-09-24 — End: 2014-10-04
  Administered 2014-09-24 – 2014-10-04 (×10): 1 [drp] via OPHTHALMIC
  Filled 2014-09-23 (×3): qty 5

## 2014-09-23 MED ORDER — HEPARIN SODIUM (PORCINE) 1000 UNIT/ML IJ SOLN
INTRAMUSCULAR | Status: DC | PRN
  Administered 2014-09-23: 5000 [IU] via INTRAVENOUS

## 2014-09-23 MED ORDER — SODIUM CHLORIDE 0.9 % WEIGHT BASED INFUSION
3.0000 mL/kg/h | INTRAVENOUS | Status: AC
  Administered 2014-09-23: 3 mL/kg/h via INTRAVENOUS

## 2014-09-23 MED ORDER — SODIUM CHLORIDE 0.9 % IV SOLN
INTRAVENOUS | Status: DC | PRN
  Administered 2014-09-23: 250 mL via INTRAVENOUS

## 2014-09-23 MED ORDER — SODIUM CHLORIDE 0.9 % IJ SOLN
3.0000 mL | Freq: Two times a day (BID) | INTRAMUSCULAR | Status: DC
  Administered 2014-09-23 – 2014-09-25 (×5): 3 mL via INTRAVENOUS

## 2014-09-23 MED ORDER — ACETAMINOPHEN 325 MG PO TABS: 650.0000 mg | ORAL_TABLET | ORAL | Status: DC | PRN

## 2014-09-23 MED ORDER — OXYBUTYNIN CHLORIDE ER 5 MG PO TB24
5.0000 mg | ORAL_TABLET | Freq: Every day | ORAL | Status: DC
  Administered 2014-09-23 – 2014-10-03 (×10): 5 mg via ORAL
  Filled 2014-09-23 (×15): qty 1

## 2014-09-23 MED ORDER — DOPAMINE-DEXTROSE 3.2-5 MG/ML-% IV SOLN
INTRAVENOUS | Status: DC | PRN
  Administered 2014-09-23: 5 ug/kg/min via INTRAVENOUS

## 2014-09-23 MED ORDER — PANTOPRAZOLE SODIUM 40 MG PO TBEC
40.0000 mg | DELAYED_RELEASE_TABLET | Freq: Every day | ORAL | Status: DC
  Administered 2014-09-23 – 2014-09-27 (×5): 40 mg via ORAL
  Filled 2014-09-23 (×5): qty 1

## 2014-09-23 MED ORDER — SIMVASTATIN 20 MG PO TABS: 20.0000 mg | ORAL_TABLET | Freq: Every day | ORAL | Status: DC

## 2014-09-23 MED ORDER — FENTANYL CITRATE (PF) 100 MCG/2ML IJ SOLN
INTRAMUSCULAR | Status: AC
  Filled 2014-09-23: qty 4

## 2014-09-23 MED ORDER — SODIUM CHLORIDE 0.9 % IJ SOLN
3.0000 mL | INTRAMUSCULAR | Status: DC | PRN
  Administered 2014-09-26: 3 mL via INTRAVENOUS
  Filled 2014-09-23: qty 3

## 2014-09-23 MED ORDER — RADIAL COCKTAIL (HEPARIN/VERAPAMIL/LIDOCAINE/NITRO)
Status: DC | PRN
  Administered 2014-09-23: 20 mL via INTRA_ARTERIAL

## 2014-09-23 MED ORDER — ZOLPIDEM TARTRATE 5 MG PO TABS
5.0000 mg | ORAL_TABLET | Freq: Once | ORAL | Status: AC
  Administered 2014-09-23: 5 mg via ORAL
  Filled 2014-09-23: qty 1

## 2014-09-23 MED ORDER — VERAPAMIL HCL 2.5 MG/ML IV SOLN
INTRAVENOUS | Status: AC
  Filled 2014-09-23: qty 2

## 2014-09-23 MED ORDER — SODIUM CHLORIDE 0.9 % IJ SOLN: 3.0000 mL | INTRAMUSCULAR | Status: DC | PRN

## 2014-09-23 MED ORDER — FENTANYL CITRATE (PF) 100 MCG/2ML IJ SOLN
INTRAMUSCULAR | Status: DC | PRN
  Administered 2014-09-23: 25 ug via INTRAVENOUS

## 2014-09-23 MED ORDER — MORPHINE SULFATE (PF) 2 MG/ML IV SOLN: 1.0000 mg | INTRAVENOUS | Status: DC | PRN

## 2014-09-23 MED ORDER — MIDAZOLAM HCL 2 MG/2ML IJ SOLN
INTRAMUSCULAR | Status: AC
  Filled 2014-09-23: qty 4

## 2014-09-23 MED ORDER — ASPIRIN 81 MG PO CHEW: 81.0000 mg | CHEWABLE_TABLET | Freq: Every day | ORAL | Status: DC

## 2014-09-23 MED ORDER — HEPARIN (PORCINE) IN NACL 2-0.9 UNIT/ML-% IJ SOLN
INTRAMUSCULAR | Status: AC
  Filled 2014-09-23: qty 1000

## 2014-09-23 MED ORDER — ADULT MULTIVITAMIN W/MINERALS CH
1.0000 | ORAL_TABLET | Freq: Every day | ORAL | Status: DC
  Administered 2014-09-24 – 2014-09-27 (×4): 1 via ORAL
  Filled 2014-09-23 (×6): qty 1

## 2014-09-23 MED ORDER — NITROGLYCERIN 1 MG/10 ML FOR IR/CATH LAB
INTRA_ARTERIAL | Status: AC
  Filled 2014-09-23: qty 10

## 2014-09-23 MED ORDER — HEPARIN SODIUM (PORCINE) 1000 UNIT/ML IJ SOLN
INTRAMUSCULAR | Status: AC
  Filled 2014-09-23: qty 1

## 2014-09-23 MED ORDER — AMLODIPINE BESYLATE 10 MG PO TABS
10.0000 mg | ORAL_TABLET | Freq: Every day | ORAL | Status: DC
  Administered 2014-09-23 – 2014-09-27 (×5): 10 mg via ORAL
  Filled 2014-09-23 (×7): qty 1

## 2014-09-23 MED ORDER — ACETAMINOPHEN 325 MG PO TABS
650.0000 mg | ORAL_TABLET | ORAL | Status: DC | PRN
  Administered 2014-09-23: 650 mg via ORAL
  Filled 2014-09-23: qty 2

## 2014-09-23 MED ORDER — MELOXICAM 15 MG PO TABS
15.0000 mg | ORAL_TABLET | Freq: Every day | ORAL | Status: DC
  Administered 2014-09-24 – 2014-09-27 (×4): 15 mg via ORAL
  Filled 2014-09-23 (×6): qty 1

## 2014-09-23 MED ORDER — IOHEXOL 350 MG/ML SOLN
INTRAVENOUS | Status: DC | PRN
  Administered 2014-09-23: 90 mL via INTRA_ARTERIAL

## 2014-09-23 MED ORDER — ASPIRIN 81 MG PO CHEW: 81.0000 mg | CHEWABLE_TABLET | ORAL | Status: DC

## 2014-09-23 MED ORDER — MIDAZOLAM HCL 2 MG/2ML IJ SOLN
INTRAMUSCULAR | Status: DC | PRN
  Administered 2014-09-23: 1 mg via INTRAVENOUS

## 2014-09-23 MED ORDER — ATORVASTATIN CALCIUM 80 MG PO TABS
80.0000 mg | ORAL_TABLET | Freq: Every day | ORAL | Status: DC
  Administered 2014-09-23 – 2014-10-03 (×10): 80 mg via ORAL
  Filled 2014-09-23 (×13): qty 1

## 2014-09-23 SURGICAL SUPPLY — 11 items
CATH INFINITI 5FR ANG PIGTAIL (CATHETERS) ×2 IMPLANT
CATH INFINITI 5FR JL4 (CATHETERS) ×2 IMPLANT
CATH INFINITI JR4 5F (CATHETERS) ×2 IMPLANT
CATH OPTITORQUE TIG 4.0 5F (CATHETERS) ×2 IMPLANT
DEVICE RAD COMP TR BAND LRG (VASCULAR PRODUCTS) ×2 IMPLANT
GLIDESHEATH SLEND A-KIT 6F 22G (SHEATH) ×2 IMPLANT
KIT HEART LEFT (KITS) ×2 IMPLANT
PACK CARDIAC CATHETERIZATION (CUSTOM PROCEDURE TRAY) ×2 IMPLANT
TRANSDUCER W/STOPCOCK (MISCELLANEOUS) ×4 IMPLANT
TUBING CIL FLEX 10 FLL-RA (TUBING) ×2 IMPLANT
WIRE SAFE-T 1.5MM-J .035X260CM (WIRE) ×2 IMPLANT

## 2014-09-23 NOTE — Interval H&P Note (Signed)
History and Physical Interval Note:  09/23/2014 3:25 PM  [name] [name]  has presented today for surgery, with the diagnosis of Unstable Angina. The various methods of treatment have been discussed with the patient and family. After consideration of risks, benefits and other options for treatment, the patient has consented to  Procedure(s): Left Heart Cath and Coronary Angiography (N/A) with Possible PCI as a surgical intervention .  The patient's history has been reviewed, patient examined, no change in status, stable for surgery.  I have reviewed the patient's chart and labs.  Questions were answered to the patient's satisfaction.     Cath Lab Visit (complete for each Cath Lab visit)  Clinical Evaluation Leading to the Procedure:   ACS: Yes.    Non-ACS:    Anginal Classification: CCS IV  Anti-ischemic medical therapy: No Therapy  Non-Invasive Test Results: No non-invasive testing performed  Prior CABG: No previous CABG   TIMI SCORE  Patient Information:  TIMI Score is 3  UA/NSTEMI and intermediate-risk features (e.g., TIMI score 3?4) for short-term risk of death or nonfatal MI  Revascularization of the presumed culprit artery   A (9)  Indication: 10; Score: 9  HARDING, Leonie Green, M.D., M.S. Interventional Cardiologist   Pager # [phone]        [facility], DAVID W

## 2014-09-23 NOTE — ED Notes (Signed)
Pt transported to xray 

## 2014-09-23 NOTE — H&P (Signed)
Patient ID: [name] [name] MRN: [id number], DOB/AGE: 09-26-1940   Admit date: 09/23/2014   Primary Physician: [name], MD Primary Cardiologist: Dr. Sallyanne Kuster  Pt. Profile:  pleasant 74 year old Caucasian male with past medical history of hypertension and hyperlipidemia presented with intermittent chest pain with exertion alleviated by rest  Problem List  Past Medical History  Diagnosis Date  . Hypertension   . Hyperlipidemia   . Shingles   . GERD (gastroesophageal reflux disease)   . Cataracts, bilateral   . Glaucoma     Past Surgical History  Procedure Laterality Date  . Cataract extraction    . Prostatectomy       Allergies  No Known Allergies  HPI  The patient is a pleasant 74 year old Caucasian male with past medical history of hypertension and hyperlipidemia. He denies any past cardiac history and has not seen a cardiologist before. He has no family history of cardiac problem either. He used to smoke for 15-20 years, however quit in 1983. He was last seen in the hospital September 2015 when he had a syncopal episode while attending a friend's funeral on a hot summer day. He has not had any syncopal episode or presyncope since. 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 On Monday, he started having intermittent chest discomfort occurring 1-2 minute each time. 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 He also endorsed associated symptom of shortness of breath. He denies any recent fever, chill, orthopnea or paroxysmal nocturnal dyspnea. He has some lower extremity edema, but has attributed to standing up all day long. Since Monday, his chest discomfort has been increasing frequency. He denies any exacerbating factors such as deep inspiration, body rotation or palpation. The only alleviating factor seems to be resting.   He was moving a 20 pound car bumper in the morning of 09/23/2014 when he had recurrent  left-sided chest discomfort radiating down left arm. He got back into his truck and rested for 5-10 minutes before the chest discomfort went away. Later he presented to [facility] for further evaluation. Of note, he has normal CBC and BMP. First troponin is negative. Chest x-ray is negative. EKG showed normal sinus rhythm without significant ST-T wave changes.  Home Medications  Prior to Admission medications   Medication Sig Start Date End Date Taking? Authorizing Provider  acyclovir (ZOVIRAX) 800 MG tablet Take 400 mg by mouth 2 (two) times daily.    Historical Provider, MD  aspirin EC 81 MG tablet Take 81 mg by mouth daily.    Historical Provider, MD  dorzolamide-timolol (COSOPT) 22.3-6.8 MG/ML ophthalmic solution Place 1 drop into the left eye daily.    Historical Provider, MD  esomeprazole (NEXIUM) 20 MG capsule Take 20 mg by mouth daily at 12 noon.    Historical Provider, MD  lisinopril (PRINIVIL,ZESTRIL) 40 MG tablet Take 40 mg by mouth daily.    Historical Provider, MD  Multiple Vitamins-Minerals (MULTIVITAMIN WITH MINERALS) tablet Take 1 tablet by mouth daily.    Historical Provider, MD  oxybutynin (DITROPAN-XL) 5 MG 24 hr tablet Take 5 mg by mouth at bedtime.    Historical Provider, MD  prednisoLONE acetate (PRED FORTE) 1 % ophthalmic suspension Place 1 drop into the left eye daily.    Historical Provider, MD  simvastatin (ZOCOR) 40 MG tablet Take 20 mg by mouth daily.    Historical Provider, MD    Family History  Family History  Problem Relation Age of  Onset  . Ovarian cancer Mother     Social History  Social History   Social History  . Marital Status: Married    Spouse Name: N/A  . Number of Children: N/A  . Years of Education: N/A   Occupational History  . Not on file.   Social History Main Topics  . Smoking status: Never Smoker   . Smokeless tobacco: Not on file  . Alcohol Use: 0.0 oz/week    0 Standard drinks or equivalent per week  . Drug Use: No  .  Sexual Activity: Yes   Other Topics Concern  . Not on file   Social History Narrative     Review of Systems General:  No chills, fever, night sweats or weight changes.  Cardiovascular:  No orthopnea, palpitations, paroxysmal nocturnal dyspnea. +chest pain, SOB and intermittent LE edema Dermatological: No rash, lesions/masses Respiratory: No cough Urologic: No hematuria, dysuria Abdominal:   No nausea, vomiting, diarrhea, bright red blood per rectum, melena, or hematemesis Neurologic:  No visual changes, wkns, changes in mental status. All other systems reviewed and are otherwise negative except as noted above.  Physical Exam  Blood pressure 148/93, pulse 58, temperature 98.1 F (36.7 C), temperature source Oral, resp. rate 20, height 6\' 2"  (1.88 m), weight 185 lb (83.915 kg), SpO2 93 %.  General: Pleasant, NAD Psych: Normal affect. Neuro: Alert and oriented X 3. Moves all extremities spontaneously. HEENT: Normal  Neck: Supple without bruits or JVD. Lungs:  Resp regular and unlabored, CTA. Heart: RRR no s3, s4. 2/6 systolic murmur at RUSB Abdomen: Soft, non-tender, non-distended, BS + x 4.  Extremities: No clubbing, cyanosis or edema. DP/PT/Radials 2+ and equal bilaterally.  Labs  Troponin [facility] of Care Test)  Recent Labs  09/23/14 1205  TROPIPOC 0.06   No results for input(s): CKTOTAL, CKMB, TROPONINI in the last 72 hours. Lab Results  Component Value Date   WBC 7.5 09/23/2014   HGB 15.9 09/23/2014   HCT 45.3 09/23/2014   MCV 91.9 09/23/2014   PLT 210 09/23/2014     Recent Labs Lab 09/23/14 1200  NA 137  K 4.0  CL 108  CO2 21*  BUN 18  CREATININE 0.90  CALCIUM 9.4  GLUCOSE 110*   No results found for: CHOL, HDL, LDLCALC, TRIG No results found for: DDIMER   Radiology/Studies  Dg Chest 2 View  09/23/2014   CLINICAL DATA:  Shortness of breath and left-sided chest pain. Left arm pain.  EXAM: CHEST  2 VIEW  COMPARISON:  09/30/2013  FINDINGS:  Cardiomediastinal silhouette is normal. Mediastinal contours appear intact. The aorta is torturous and contains atherosclerotic calcifications of the arch. There is stable eventration of the left hemidiaphragm. Bilateral fullness of the hilar regions is likely related to prominence of the main pulmonary arteries.  There is no evidence of focal airspace consolidation, pleural effusion or pneumothorax.  Osseous structures are without acute abnormality. Soft tissues are grossly normal.  IMPRESSION: No active cardiopulmonary disease.  Bilateral hilar fullness likely secondary to prominent main pulmonary arteries.   Electronically Signed   By: Fidela Salisbury M.D.   On: 09/23/2014 12:11    ECG  Normal sinus rhythm without significant ST-T wave changes.   ASSESSMENT AND PLAN  1. Chest pain concerning for unstable angina alleviated with rest  - Cardiac risk factors: age, HTN, HLD, h/o smoking  - will discuss with MD for possible cardiac cath vs myoview  Addendum: cath today. Risk and benefit of procedure explained  to the patient who display clear understanding and agree to proceed. Discussed with patient possible procedural risk include bleeding, vascular injury, renal injury, arrythmia, MI, stroke and loss of limb or life.  2. HTN 3. HLD 4. LE pitting edema: lung clear, ?venous insufficiency.  5. AS by physical exam 2/6 murmur: obtain echo  Hilbert Corrigan, PA-C 09/23/2014, 1:55 PM  I have seen and examined the patient along with Almyra Deforest, PA-C.  I have reviewed the chart, notes and new data.  I agree with PA's note.  Key new complaints: symptoms strongly suggest accelerated exertional angina,recent onset Key examination changes: early to mid peaking aortic stenosis murmur with brisk distal pulses; needs evaluation by echo, but recent duration of chest discomfort and rapid symptom progression would make AS an unlikely etiology for his symptoms Key new findings / data: ECG and cardiac  biomarkers are not high risk  PLAN: Discussed conservative approach (stress test) versus invasive evaluation with angiography. He prefers the latter and I think that is reasonable with his rapid symptom progression. This procedure has been fully reviewed with the patient and written informed consent has been obtained for diagnostic angiography as well as possible PCI/stent.   Sanda Klein, MD, [place] [phone] 09/23/2014, 2:47 PM

## 2014-09-23 NOTE — ED Notes (Signed)
Pt returned from xray and placed on monitor.

## 2014-09-23 NOTE — Progress Notes (Signed)
ANTICOAGULATION CONSULT NOTE - Initial Consult  Pharmacy Consult for heparin Indication: ACS / STEMI  No Known Allergies  Patient Measurements: Height: 6\' 2"  (188 cm) Weight: 187 lb 9.8 oz (85.1 kg) IBW/kg (Calculated) : 82.2  Vital Signs: Temp: 98 F (36.7 C) (09/02 1900) Temp Source: Oral (09/02 1636) BP: 135/83 mmHg (09/02 1900) Pulse Rate: 66 (09/02 1900)  Labs:  Recent Labs  09/23/14 1200 09/23/14 1720  HGB 15.9  --   HCT 45.3  --   PLT 210  --   CREATININE 0.90  --   TROPONINI  --  0.43*    Estimated Creatinine Clearance: 82.5 mL/min (by C-G formula based on Cr of 0.9).   Medical History: Past Medical History  Diagnosis Date  . Hypertension   . Hyperlipidemia   . Shingles   . GERD (gastroesophageal reflux disease)   . Cataracts, bilateral   . Glaucoma     Assessment: 74 yo M presents on 9/2 with intermittent chest pain alleviated by rest. PMH includes HTN and HLD. He is s/p cath with multivessel CAD and for potential CABG and possible AVR Pharmacy consulted to start heparin 6 hours post TR band removal (sheath removed about 4pm and TR band removed at about 9:30pm).   Goal of Therapy:  Heparin level 0.3-0.7 units/ml Monitor platelets by anticoagulation protocol: Yes   Plan:  -No heparin bolus -Start heparin gtt at 1050 units/hr -Heparin level in 8 hours and daily wth CBC daily  Hildred Laser, Pharm D 09/23/2014 9:49 PM

## 2014-09-23 NOTE — ED Notes (Signed)
Pt reports intermittent dull pains to left arm x 1 week. This am had sob and left side chest pains. Denies n/v. No acute distress noted at triage, ekg done.

## 2014-09-23 NOTE — ED Provider Notes (Signed)
CSN: [id number]     Arrival date & time 09/23/14  1122 History   First MD Initiated Contact with Patient 09/23/14 1143     Chief Complaint  Patient presents with  . Chest Pain  . Shortness of Breath  . 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  He rested in the car and felt improvement, then took an ASA and felt better and now is sx free.  Sx were actue in onset with exertion as they have been all week long - he has never had cardiac testing, has not had tobacco since mid 80's.  Patient is a 74 y.o. male presenting with chest pain, shortness of breath, and arm pain. The history is provided by the patient.  Chest Pain Associated symptoms: shortness of breath   Shortness of Breath Associated symptoms: chest pain   Arm Pain Associated symptoms include chest pain and shortness of breath.    Past Medical History  Diagnosis Date  . Hypertension   . Hyperlipidemia   . Shingles   . GERD (gastroesophageal reflux disease)   . Cataracts, bilateral   . Glaucoma    Past Surgical History  Procedure Laterality Date  . Cataract extraction    . Prostatectomy     Family History  Problem Relation Age of Onset  . Ovarian cancer Mother    Social History  Substance Use Topics  . Smoking status: Never Smoker   . Smokeless tobacco: None  . Alcohol Use: 0.0 oz/week    0 Standard drinks or equivalent per week    Review of Systems  Respiratory: Positive for shortness of breath.   Cardiovascular: Positive for chest pain.  All other systems reviewed and are negative.     Allergies  Review of patient's allergies indicates no known allergies.  Home Medications   Prior to Admission medications   Medication Sig Start Date  End Date Taking? Authorizing Provider  acyclovir (ZOVIRAX) 800 MG tablet Take 400 mg by mouth 2 (two) times daily.   Yes Historical Provider, MD  amLODipine (NORVASC) 10 MG tablet Take 10 mg by mouth daily.   Yes Historical Provider, MD  aspirin EC 81 MG tablet Take 81 mg by mouth daily.   Yes Historical Provider, MD  lansoprazole (PREVACID) 30 MG capsule Take 30 mg by mouth daily at 12 noon.   Yes Historical Provider, MD  lisinopril (PRINIVIL,ZESTRIL) 40 MG tablet Take 40 mg by mouth daily.   Yes Historical Provider, MD  meloxicam (MOBIC) 15 MG tablet Take 15 mg by mouth daily.   Yes Historical Provider, MD  Multiple Vitamins-Minerals (MULTIVITAMIN WITH MINERALS) tablet Take 1 tablet by mouth daily.   Yes Historical Provider, MD  oxybutynin (DITROPAN-XL) 5 MG 24 hr tablet Take 5 mg by mouth at bedtime.   Yes Historical Provider, MD  prednisoLONE acetate (PRED FORTE) 1 % ophthalmic suspension Place 1 drop into the left eye daily.   Yes Historical Provider, MD  sildenafil (REVATIO) 20 MG tablet Take 20 mg by mouth daily as needed.   Yes Historical Provider, MD  simvastatin (ZOCOR) 40 MG tablet Take 20 mg by mouth daily.   Yes Historical Provider, MD  timolol (BETIMOL) 0.5 % ophthalmic solution Place 1 drop into the left eye daily.  Yes Historical Provider, MD   BP 119/79 mmHg  Pulse 57  Temp(Src) 98.3 F (36.8 C) (Oral)  Resp 26  Ht 6\' 2"  (1.88 m)  Wt 187 lb 9.8 oz (85.1 kg)  BMI 24.08 kg/m2  SpO2 97% Physical Exam  Constitutional: He appears well-developed and well-nourished. No distress.  HENT:  Head: Normocephalic and atraumatic.  Mouth/Throat: Oropharynx is clear and moist. No oropharyngeal exudate.  Eyes: Conjunctivae and EOM are normal. Pupils are equal, round, and reactive to light. Right eye exhibits no discharge. Left eye exhibits no discharge. No scleral icterus.  Neck: Normal range of motion. Neck supple. No JVD present. No thyromegaly present.  Cardiovascular: Normal rate,  regular rhythm, normal heart sounds and intact distal pulses.  Exam reveals no gallop and no friction rub.   No murmur heard. Pulmonary/Chest: Effort normal and breath sounds normal. No respiratory distress. He has no wheezes. He has no rales.  Abdominal: Soft. Bowel sounds are normal. He exhibits no distension and no mass. There is no tenderness.  Musculoskeletal: Normal range of motion. He exhibits no edema or tenderness.  Lymphadenopathy:    He has no cervical adenopathy.  Neurological: He is alert. Coordination normal.  Skin: Skin is warm and dry. No rash noted. No erythema.  Psychiatric: He has a normal mood and affect. His behavior is normal.  Nursing note and vitals reviewed.   ED Course  Procedures (including critical care time) Labs Review Labs Reviewed  BASIC METABOLIC PANEL - Abnormal; Notable for the following:    CO2 21 (*)    Glucose, Bld 110 (*)    All other components within normal limits  TROPONIN I - Abnormal; Notable for the following:    Troponin I 0.43 (*)    All other components within normal limits  TROPONIN I - Abnormal; Notable for the following:    Troponin I 0.57 (*)    All other components within normal limits  TROPONIN I - Abnormal; Notable for the following:    Troponin I 0.47 (*)    All other components within normal limits  BASIC METABOLIC PANEL - Abnormal; Notable for the following:    Glucose, Bld 103 (*)    Calcium 8.6 (*)    All other components within normal limits  HEPARIN LEVEL (UNFRACTIONATED) - Abnormal; Notable for the following:    Heparin Unfractionated 0.23 (*)    All other components within normal limits  SURGICAL PCR SCREEN  CBC  CBC  HEPARIN LEVEL (UNFRACTIONATED)  HEPARIN LEVEL (UNFRACTIONATED)  HEPARIN LEVEL (UNFRACTIONATED)  CBC  I-STAT TROPOININ, ED    Imaging Review Dg Chest 2 View  09/23/2014   CLINICAL DATA:  Shortness of breath and left-sided chest pain. Left arm pain.  EXAM: CHEST  2 VIEW  COMPARISON:  09/30/2013   FINDINGS: Cardiomediastinal silhouette is normal. Mediastinal contours appear intact. The aorta is torturous and contains atherosclerotic calcifications of the arch. There is stable eventration of the left hemidiaphragm. Bilateral fullness of the hilar regions is likely related to prominence of the main pulmonary arteries.  There is no evidence of focal airspace consolidation, pleural effusion or pneumothorax.  Osseous structures are without acute abnormality. Soft tissues are grossly normal.  IMPRESSION: No active cardiopulmonary disease.  Bilateral hilar fullness likely secondary to prominent main pulmonary arteries.   Electronically Signed   By: Fidela Salisbury M.D.   On: 09/23/2014 12:11   I have personally reviewed and evaluated these images and lab results as part of my medical  decision-making.   EKG Interpretation   Date/Time:  Friday September 23 2014 11:24:22 EDT Ventricular Rate:  70 PR Interval:  184 QRS Duration: 94 QT Interval:  376 QTC Calculation: 406 R Axis:   -20 Text Interpretation:  Normal sinus rhythm Normal ECG since last tracing no  significant change Confirmed by Gayl Ivanoff  MD, Osiel Stick ([id number]) on 09/23/2014  11:58:40 AM      MDM   Final diagnoses:  Unstable angina   Exam is unremarkable at this time, CXR and labs withotu acute findings thus far - VS with htn and no other findings - his history sounds likely angina and coming to [facility] with his progressive sx.  Will d/w Cardiology as pt need provocative testing.  The pt has a high HEART SCORE of 5, d/w cardiology  Cardiology to see the pt for admission.  Has already had ASA today.  CRITICAL CARE Performed by: Johnna Acosta Total critical care time: 35 Critical care time was exclusive of separately billable procedures and treating other patients. Critical care was necessary to treat or prevent imminent or life-threatening deterioration. Critical care was time spent personally by me on the following activities:  development of treatment plan with patient and/or surrogate as well as nursing, discussions with consultants, evaluation of patient's response to treatment, examination of patient, obtaining history from patient or surrogate, ordering and performing treatments and interventions, ordering and review of laboratory studies, ordering and review of radiographic studies, pulse oximetry and re-evaluation of patient's condition.   Noemi Chapel, MD 09/24/14 [phone]

## 2014-09-24 ENCOUNTER — Observation Stay (HOSPITAL_COMMUNITY): Payer: Medicare Other

## 2014-09-24 DIAGNOSIS — I350 Nonrheumatic aortic (valve) stenosis: Secondary | ICD-10-CM | POA: Diagnosis present

## 2014-09-24 DIAGNOSIS — D62 Acute posthemorrhagic anemia: Secondary | ICD-10-CM | POA: Diagnosis not present

## 2014-09-24 DIAGNOSIS — I359 Nonrheumatic aortic valve disorder, unspecified: Secondary | ICD-10-CM

## 2014-09-24 DIAGNOSIS — I214 Non-ST elevation (NSTEMI) myocardial infarction: Secondary | ICD-10-CM | POA: Diagnosis present

## 2014-09-24 DIAGNOSIS — K0530 Chronic periodontitis, unspecified: Secondary | ICD-10-CM | POA: Diagnosis present

## 2014-09-24 DIAGNOSIS — Z954 Presence of other heart-valve replacement: Secondary | ICD-10-CM | POA: Diagnosis not present

## 2014-09-24 DIAGNOSIS — K5900 Constipation, unspecified: Secondary | ICD-10-CM | POA: Diagnosis present

## 2014-09-24 DIAGNOSIS — I200 Unstable angina: Secondary | ICD-10-CM | POA: Diagnosis present

## 2014-09-24 DIAGNOSIS — Z01818 Encounter for other preprocedural examination: Secondary | ICD-10-CM | POA: Diagnosis not present

## 2014-09-24 DIAGNOSIS — I491 Atrial premature depolarization: Secondary | ICD-10-CM | POA: Diagnosis not present

## 2014-09-24 DIAGNOSIS — D696 Thrombocytopenia, unspecified: Secondary | ICD-10-CM | POA: Diagnosis not present

## 2014-09-24 DIAGNOSIS — Z01810 Encounter for preprocedural cardiovascular examination: Secondary | ICD-10-CM | POA: Diagnosis not present

## 2014-09-24 DIAGNOSIS — E785 Hyperlipidemia, unspecified: Secondary | ICD-10-CM | POA: Diagnosis present

## 2014-09-24 DIAGNOSIS — H409 Unspecified glaucoma: Secondary | ICD-10-CM | POA: Diagnosis present

## 2014-09-24 DIAGNOSIS — Z951 Presence of aortocoronary bypass graft: Secondary | ICD-10-CM | POA: Diagnosis not present

## 2014-09-24 DIAGNOSIS — Z79899 Other long term (current) drug therapy: Secondary | ICD-10-CM | POA: Diagnosis not present

## 2014-09-24 DIAGNOSIS — I10 Essential (primary) hypertension: Secondary | ICD-10-CM | POA: Diagnosis present

## 2014-09-24 DIAGNOSIS — Z7982 Long term (current) use of aspirin: Secondary | ICD-10-CM | POA: Diagnosis not present

## 2014-09-24 DIAGNOSIS — I4891 Unspecified atrial fibrillation: Secondary | ICD-10-CM | POA: Diagnosis present

## 2014-09-24 DIAGNOSIS — I2510 Atherosclerotic heart disease of native coronary artery without angina pectoris: Secondary | ICD-10-CM | POA: Diagnosis not present

## 2014-09-24 DIAGNOSIS — I471 Supraventricular tachycardia: Secondary | ICD-10-CM | POA: Diagnosis not present

## 2014-09-24 DIAGNOSIS — Z87891 Personal history of nicotine dependence: Secondary | ICD-10-CM | POA: Diagnosis not present

## 2014-09-24 DIAGNOSIS — K219 Gastro-esophageal reflux disease without esophagitis: Secondary | ICD-10-CM | POA: Diagnosis present

## 2014-09-24 DIAGNOSIS — I25110 Atherosclerotic heart disease of native coronary artery with unstable angina pectoris: Secondary | ICD-10-CM | POA: Diagnosis present

## 2014-09-24 DIAGNOSIS — R001 Bradycardia, unspecified: Secondary | ICD-10-CM | POA: Diagnosis not present

## 2014-09-24 DIAGNOSIS — J9811 Atelectasis: Secondary | ICD-10-CM | POA: Diagnosis not present

## 2014-09-24 LAB — BASIC METABOLIC PANEL
ANION GAP: 7 (ref 5–15)
BUN: 13 mg/dL (ref 6–20)
CALCIUM: 8.6 mg/dL — AB (ref 8.9–10.3)
CHLORIDE: 108 mmol/L (ref 101–111)
CO2: 23 mmol/L (ref 22–32)
CREATININE: 0.83 mg/dL (ref 0.61–1.24)
GFR calc non Af Amer: >60 mL/min (ref >60)
Glucose, Bld: 103 mg/dL — ABNORMAL HIGH (ref 65–99)
Potassium: 3.5 mmol/L (ref 3.5–5.1)
SODIUM: 138 mmol/L (ref 135–145)

## 2014-09-24 LAB — GLUCOSE, CAPILLARY: Glucose-Capillary: 97 mg/dL (ref 65–99)

## 2014-09-24 LAB — TROPONIN I: Troponin I: 0.47 ng/mL — ABNORMAL HIGH (ref <0.031)

## 2014-09-24 LAB — HEPARIN LEVEL (UNFRACTIONATED)
HEPARIN UNFRACTIONATED: 0.23 [IU]/mL — AB (ref 0.30–0.70)
Heparin Unfractionated: 0.44 IU/mL (ref 0.30–0.70)
Heparin Unfractionated: 0.64 IU/mL (ref 0.30–0.70)

## 2014-09-24 LAB — CBC
HCT: 39.8 % (ref 39.0–52.0)
HEMOGLOBIN: 13.7 g/dL (ref 13.0–17.0)
MCH: 31.9 pg (ref 26.0–34.0)
MCHC: 34.4 g/dL (ref 30.0–36.0)
MCV: 92.8 fL (ref 78.0–100.0)
PLATELETS: 198 10*3/uL (ref 150–400)
RBC: 4.29 MIL/uL (ref 4.22–5.81)
RDW: 12.8 % (ref 11.5–15.5)
WBC: 8.5 10*3/uL (ref 4.0–10.5)

## 2014-09-24 MED ORDER — METOPROLOL TARTRATE 12.5 MG HALF TABLET
12.5000 mg | ORAL_TABLET | Freq: Two times a day (BID) | ORAL | Status: DC
  Administered 2014-09-24 – 2014-09-27 (×8): 12.5 mg via ORAL
  Filled 2014-09-24 (×12): qty 1

## 2014-09-24 NOTE — Progress Notes (Signed)
[place] for heparin Indication: ACS / STEMI  No Known Allergies  Patient Measurements: Height: 6\' 2"  (188 cm) Weight: 187 lb 9.8 oz (85.1 kg) IBW/kg (Calculated) : 82.2  Vital Signs: Temp: 98.1 F (36.7 C) (09/03 0449) Temp Source: Oral (09/03 0449) BP: 122/82 mmHg (09/03 0400) Pulse Rate: 58 (09/03 0400)  Labs:  Recent Labs  09/23/14 1200 09/23/14 1720 09/23/14 2206 09/24/14 0443 09/24/14 0624  HGB 15.9  --   --  13.7  --   HCT 45.3  --   --  39.8  --   PLT 210  --   --  198  --   HEPARINUNFRC  --   --   --   --  0.23*  CREATININE 0.90  --   --  0.83  --   TROPONINI  --  0.43* 0.57* 0.47*  --     Estimated Creatinine Clearance: 89.4 mL/min (by C-G formula based on Cr of 0.83).  Assessment: 74 yo male with 3 vessel CAD s/p cath, awaiting possible CABG, for heparin Goal of Therapy:  Heparin level 0.3-0.7 units/ml Monitor platelets by anticoagulation protocol: Yes   Plan:  Increase Heparin 1250 units/hr Check heparin level in 8 hours.   Phillis Knack, PharmD, BCPS   09/24/2014 7:06 AM

## 2014-09-24 NOTE — Progress Notes (Signed)
ANTICOAGULATION CONSULT NOTE Pharmacy Consult for heparin Indication: ACS / STEMI  No Known Allergies  Patient Measurements: Height: 6\' 2"  (188 cm) Weight: 187 lb 9.8 oz (85.1 kg) IBW/kg (Calculated) : 82.2  Vital Signs: Temp: 98.3 F (36.8 C) (09/03 1921) Temp Source: Oral (09/03 1921) BP: 137/96 mmHg (09/03 2000) Pulse Rate: 63 (09/03 2100)  Labs:  Recent Labs  09/23/14 1200 09/23/14 1720 09/23/14 2206 09/24/14 0443 09/24/14 0624 09/24/14 1324 09/24/14 2209  HGB 15.9  --   --  13.7  --   --   --   HCT 45.3  --   --  39.8  --   --   --   PLT 210  --   --  198  --   --   --   HEPARINUNFRC  --   --   --   --  0.23* 0.44 0.64  CREATININE 0.90  --   --  0.83  --   --   --   TROPONINI  --  0.43* 0.57* 0.47*  --   --   --     Estimated Creatinine Clearance: 89.4 mL/min (by C-G formula based on Cr of 0.83).  Assessment: 74 yo male with 3 vessel CAD s/p cath, awaiting possible CABG, for heparin Goal of Therapy:  Heparin level 0.3-0.7 units/ml Monitor platelets by anticoagulation protocol: Yes   Plan:  .Continue Heparin at current rate  Follow-up am labs.   Phillis Knack, PharmD, BCPS   09/24/2014 11:41 PM

## 2014-09-24 NOTE — Progress Notes (Signed)
[id number] Cardiac Rehab Completed pre-op surgery education with pt. I gave pt going for heart surgery booklet and pt care guide. Instructed him in use of IS. Placed pre-op surgery video on for him to watch. We discussed sternal precautions, walking post-op and use of IS. He states that his wife can provide 24/7 care for him at discharge.  Deon Pilling, RN 09/24/2014 3:07 PM

## 2014-09-24 NOTE — Progress Notes (Signed)
Echocardiogram 2D Echocardiogram has been performed.  Tresa Res 09/24/2014, 2:30 PM

## 2014-09-24 NOTE — Progress Notes (Signed)
[place] for heparin Indication: ACS / STEMI  No Known Allergies  Patient Measurements: Height: 6\' 2"  (188 cm) Weight: 187 lb 9.8 oz (85.1 kg) IBW/kg (Calculated) : 82.2  Vital Signs: Temp: 98.2 F (36.8 C) (09/03 1117) Temp Source: Oral (09/03 1117) BP: 131/79 mmHg (09/03 1200) Pulse Rate: 57 (09/03 1200)  Labs:  Recent Labs  09/23/14 1200 09/23/14 1720 09/23/14 2206 09/24/14 0443 09/24/14 0624 09/24/14 1324  HGB 15.9  --   --  13.7  --   --   HCT 45.3  --   --  39.8  --   --   PLT 210  --   --  198  --   --   HEPARINUNFRC  --   --   --   --  0.23* 0.44  CREATININE 0.90  --   --  0.83  --   --   TROPONINI  --  0.43* 0.57* 0.47*  --   --     Estimated Creatinine Clearance: 89.4 mL/min (by C-G formula based on Cr of 0.83).  Assessment: 74 yo male with 3 vessel CAD s/p cath, awaiting possible CABG, for heparin. HL is now therapeutic at 0.44 after rate increase this am. CBC wnl and stable with no reported overt s/s bleeding.   Goal of Therapy:  Heparin level 0.3-0.7 units/ml Monitor platelets by anticoagulation protocol: Yes   Plan:  Continue Heparin gtt at 1250 units/hr Check heparin level in 8 hours to confirm Daily HL/CBC Monitor for s/s bleeding F/u plans for CABG  Darryn Kydd K. Velva Harman, PharmD, BCPS, CPP Clinical Pharmacist Pager: [phone] Phone: [phone] 09/24/2014 3:23 PM

## 2014-09-24 NOTE — Progress Notes (Signed)
    Subjective:  Denies CP or dyspnea, had twinges of chest pain last evening.   Objective:  Filed Vitals:   09/24/14 0051 09/24/14 0400 09/24/14 0449 09/24/14 0709  BP: 139/83 122/82  149/93  Pulse: 62 58  62  Temp:   98.1 F (36.7 C) 98 F (36.7 C)  TempSrc:   Oral Oral  Resp: 25 19  18   Height:      Weight:      SpO2: 93% 94%  93%    Intake/Output from previous day:  Intake/Output Summary (Last 24 hours) at 09/24/14 0830 Last data filed at 09/24/14 8250  Gross per 24 hour  Intake 789.12 ml  Output   1375 ml  Net -585.88 ml    Physical Exam: Physical exam: Well-developed well-nourished in no acute distress.  Skin is warm and dry.  HEENT is normal.  Neck is supple.  Chest is clear to auscultation with normal expansion.  Cardiovascular exam is regular rate and rhythm. 3/6 systolic murmur LSB Abdominal exam nontender or distended. No masses palpated. Extremities show no edema. neuro grossly intact    Lab Results: Basic Metabolic Panel:  Recent Labs  09/23/14 1200 09/24/14 0443  NA 137 138  K 4.0 3.5  CL 108 108  CO2 21* 23  GLUCOSE 110* 103*  BUN 18 13  CREATININE 0.90 0.83  CALCIUM 9.4 8.6*   CBC:  Recent Labs  09/23/14 1200 09/24/14 0443  WBC 7.5 8.5  HGB 15.9 13.7  HCT 45.3 39.8  MCV 91.9 92.8  PLT 210 198   Cardiac Enzymes:  Recent Labs  09/23/14 1720 09/23/14 2206 09/24/14 0443  TROPONINI 0.43* 0.57* 0.47*     Assessment/Plan:  1 non-ST elevation myocardial infarction-continue aspirin, heparin and statin. Add low-dose metoprolol. Cardiac catheterization revealed severe coronary disease. Plan surgical consult for coronary artery bypass graft. 2 aortic stenosis murmur-echocardiogram to further assess. May need aortic valve replacement at time of surgery. 3 hypertension-continue present medications.  Kirk Ruths 09/24/2014, 8:30 AM

## 2014-09-24 NOTE — Consult Note (Signed)
Reason for Consult:CAD with unstable coronary syndrome Referring Physician: Dr. [name] is an 74 y.o. male.  HPI: 74 yo man presents with a chief complaint of chest pain.  [name] [name] is a 74 yo man with no prior history of CAD. He has a history of hypertension, hyperlipidemia, syncope (x 1 about a year ago), GERD, and a hear murmur. He has been having chest pressure and pain in his upper left arm with exertion for the past week. This was relieved with rest after a few minutes. He would feel SOB when it occurred. It was becoming more frequent, and then  yesterday he had a severe episode after lifting ~ 20 pounds. This lasted longer and was more severe. It also resolved spontaneously. He told his wife about and she brought him to the ED.   On arrival he was pain free. Initial troponin was negative, but later bumped to 0.57. ECG did not show ST-T wave changes. He was admitted and had cardiac catheterization. It revealed tight LAD and distal RCA stenoses. There were 2 ramus branches that both had ostial disease. There was moderate disease in OM1. He has had a couple of "twinges of pain" since admission, but nothing lasting more than a few seconds.   Past Medical History  Diagnosis Date  . Hypertension   . Hyperlipidemia   . Shingles   . GERD (gastroesophageal reflux disease)   . Cataracts, bilateral   . Glaucoma     Past Surgical History  Procedure Laterality Date  . Cataract extraction    . Prostatectomy      Family History  Problem Relation Age of Onset  . Ovarian cancer Mother     Social History:  reports that he has never smoked. He does not have any smokeless tobacco history on file. He reports that he drinks alcohol. He reports that he does not use illicit drugs.  Allergies: No Known Allergies  Medications:  Prior to Admission:  Prescriptions prior to admission  Medication Sig Dispense Refill Last Dose  . acyclovir (ZOVIRAX) 800 MG tablet Take 400 mg by  mouth 2 (two) times daily.   09/23/2014 at Unknown time  . amLODipine (NORVASC) 10 MG tablet Take 10 mg by mouth daily.   09/22/2014 at Unknown time  . aspirin EC 81 MG tablet Take 81 mg by mouth daily.   09/23/2014 at Unknown time  . lansoprazole (PREVACID) 30 MG capsule Take 30 mg by mouth daily at 12 noon.   09/22/2014 at Unknown time  . lisinopril (PRINIVIL,ZESTRIL) 40 MG tablet Take 40 mg by mouth daily.   09/22/2014 at Unknown time  . meloxicam (MOBIC) 15 MG tablet Take 15 mg by mouth daily.   09/22/2014 at Unknown time  . Multiple Vitamins-Minerals (MULTIVITAMIN WITH MINERALS) tablet Take 1 tablet by mouth daily.   09/22/2014 at Unknown time  . oxybutynin (DITROPAN-XL) 5 MG 24 hr tablet Take 5 mg by mouth at bedtime.   09/22/2014 at Unknown time  . prednisoLONE acetate (PRED FORTE) 1 % ophthalmic suspension Place 1 drop into the left eye daily.   09/22/2014 at Unknown time  . sildenafil (REVATIO) 20 MG tablet Take 20 mg by mouth daily as needed.   09/22/2014 at Unknown time  . simvastatin (ZOCOR) 40 MG tablet Take 20 mg by mouth daily.   09/22/2014 at Unknown time  . timolol (BETIMOL) 0.5 % ophthalmic solution Place 1 drop into the left eye daily.   09/23/2014 at Unknown time  Results for orders placed or performed during the hospital encounter of 09/23/14 (from the past 48 hour(s))  Basic metabolic panel     Status: Abnormal   Collection Time: 09/23/14 12:00 PM  Result Value Ref Range   Sodium 137 135 - 145 mmol/L   Potassium 4.0 3.5 - 5.1 mmol/L   Chloride 108 101 - 111 mmol/L   CO2 21 (L) 22 - 32 mmol/L   Glucose, Bld 110 (H) 65 - 99 mg/dL   BUN 18 6 - 20 mg/dL   Creatinine, Ser 0.90 0.61 - 1.24 mg/dL   Calcium 9.4 8.9 - 10.3 mg/dL   GFR calc non Af Amer >60 >60 mL/min   GFR calc Af Amer >60 >60 mL/min    Comment: (NOTE) The eGFR has been calculated using the CKD EPI equation. This calculation has not been validated in all clinical situations. eGFR's persistently <60 mL/min signify possible  Chronic Kidney Disease.    Anion gap 8 5 - 15  CBC     Status: None   Collection Time: 09/23/14 12:00 PM  Result Value Ref Range   WBC 7.5 4.0 - 10.5 K/uL   RBC 4.93 4.22 - 5.81 MIL/uL   Hemoglobin 15.9 13.0 - 17.0 g/dL   HCT 45.3 39.0 - 52.0 %   MCV 91.9 78.0 - 100.0 fL   MCH 32.3 26.0 - 34.0 pg   MCHC 35.1 30.0 - 36.0 g/dL   RDW 12.8 11.5 - 15.5 %   Platelets 210 150 - 400 K/uL  I-stat troponin, ED     Status: None   Collection Time: 09/23/14 12:05 PM  Result Value Ref Range   Troponin i, poc 0.06 0.00 - 0.08 ng/mL   Comment 3            Comment: Due to the release kinetics of cTnI, a negative result within the first hours of the onset of symptoms does not rule out myocardial infarction with certainty. If myocardial infarction is still suspected, repeat the test at appropriate intervals.   Surgical pcr screen     Status: None   Collection Time: 09/23/14  4:42 PM  Result Value Ref Range   MRSA, PCR NEGATIVE NEGATIVE   Staphylococcus aureus NEGATIVE NEGATIVE    Comment:        The Xpert SA Assay (FDA approved for NASAL specimens in patients over 11 years of age), is one component of a comprehensive surveillance program.  Test performance has been validated by [facility] for patients greater than or equal to 75 year old. It is not intended to diagnose infection nor to guide or monitor treatment.   Troponin I     Status: Abnormal   Collection Time: 09/23/14  5:20 PM  Result Value Ref Range   Troponin I 0.43 (H) <0.031 ng/mL    Comment:        PERSISTENTLY INCREASED TROPONIN VALUES IN THE RANGE OF 0.04-0.49 ng/mL CAN BE SEEN IN:       -UNSTABLE ANGINA       -CONGESTIVE HEART FAILURE       -MYOCARDITIS       -CHEST TRAUMA       -ARRYHTHMIAS       -LATE PRESENTING MYOCARDIAL INFARCTION       -COPD   CLINICAL FOLLOW-UP RECOMMENDED.   Troponin I     Status: Abnormal   Collection Time: 09/23/14 10:06 PM  Result Value Ref Range   Troponin I 0.57 (HH) <0.031  ng/mL  Comment:        POSSIBLE MYOCARDIAL ISCHEMIA. SERIAL TESTING RECOMMENDED. CRITICAL RESULT CALLED TO, READ BACK BY AND VERIFIED WITH: YIMBU C,RN 09/23/14 2303 WAYK   Troponin I     Status: Abnormal   Collection Time: 09/24/14  4:43 AM  Result Value Ref Range   Troponin I 0.47 (H) <0.031 ng/mL    Comment:        PERSISTENTLY INCREASED TROPONIN VALUES IN THE RANGE OF 0.04-0.49 ng/mL CAN BE SEEN IN:       -UNSTABLE ANGINA       -CONGESTIVE HEART FAILURE       -MYOCARDITIS       -CHEST TRAUMA       -ARRYHTHMIAS       -LATE PRESENTING MYOCARDIAL INFARCTION       -COPD   CLINICAL FOLLOW-UP RECOMMENDED.   Basic metabolic panel     Status: Abnormal   Collection Time: 09/24/14  4:43 AM  Result Value Ref Range   Sodium 138 135 - 145 mmol/L   Potassium 3.5 3.5 - 5.1 mmol/L   Chloride 108 101 - 111 mmol/L   CO2 23 22 - 32 mmol/L   Glucose, Bld 103 (H) 65 - 99 mg/dL   BUN 13 6 - 20 mg/dL   Creatinine, Ser 0.83 0.61 - 1.24 mg/dL   Calcium 8.6 (L) 8.9 - 10.3 mg/dL   GFR calc non Af Amer >60 >60 mL/min   GFR calc Af Amer >60 >60 mL/min    Comment: (NOTE) The eGFR has been calculated using the CKD EPI equation. This calculation has not been validated in all clinical situations. eGFR's persistently <60 mL/min signify possible Chronic Kidney Disease.    Anion gap 7 5 - 15  CBC     Status: None   Collection Time: 09/24/14  4:43 AM  Result Value Ref Range   WBC 8.5 4.0 - 10.5 K/uL   RBC 4.29 4.22 - 5.81 MIL/uL   Hemoglobin 13.7 13.0 - 17.0 g/dL   HCT 39.8 39.0 - 52.0 %   MCV 92.8 78.0 - 100.0 fL   MCH 31.9 26.0 - 34.0 pg   MCHC 34.4 30.0 - 36.0 g/dL   RDW 12.8 11.5 - 15.5 %   Platelets 198 150 - 400 K/uL  Heparin level (unfractionated)     Status: Abnormal   Collection Time: 09/24/14  6:24 AM  Result Value Ref Range   Heparin Unfractionated 0.23 (L) 0.30 - 0.70 IU/mL    Comment:        IF HEPARIN RESULTS ARE BELOW EXPECTED VALUES, AND PATIENT DOSAGE HAS BEEN  CONFIRMED, SUGGEST FOLLOW UP TESTING OF ANTITHROMBIN III LEVELS.     Dg Chest 2 View  09/23/2014   CLINICAL DATA:  Shortness of breath and left-sided chest pain. Left arm pain.  EXAM: CHEST  2 VIEW  COMPARISON:  09/30/2013  FINDINGS: Cardiomediastinal silhouette is normal. Mediastinal contours appear intact. The aorta is torturous and contains atherosclerotic calcifications of the arch. There is stable eventration of the left hemidiaphragm. Bilateral fullness of the hilar regions is likely related to prominence of the main pulmonary arteries.  There is no evidence of focal airspace consolidation, pleural effusion or pneumothorax.  Osseous structures are without acute abnormality. Soft tissues are grossly normal.  IMPRESSION: No active cardiopulmonary disease.  Bilateral hilar fullness likely secondary to prominent main pulmonary arteries.   Electronically Signed   By: Fidela Salisbury M.D.   On: 09/23/2014 12:11  Review of Systems  Constitutional: Positive for malaise/fatigue. Negative for fever and chills.  HENT:       Tooth loss, gum disease  Eyes: Negative.   Respiratory: Positive for shortness of breath. Negative for cough and wheezing.   Cardiovascular: Positive for chest pain and leg swelling. Negative for orthopnea and claudication.  Gastrointestinal: Positive for heartburn. Negative for nausea, vomiting and abdominal pain.  Genitourinary: Negative for dysuria, urgency and frequency.  Neurological: Positive for loss of consciousness (x1 about a year ago). Negative for dizziness.  Endo/Heme/Allergies: Does not bruise/bleed easily.  All other systems reviewed and are negative.  Blood pressure 131/79, pulse 57, temperature 98.2 F (36.8 C), temperature source Oral, resp. rate 20, height _0  (1.88 m), weight 187 lb 9.8 oz (85.1 kg), SpO2 95 %. Physical Exam  Vitals reviewed. Constitutional: He is oriented to person, place, and time. He appears well-developed and well-nourished. No  distress.  HENT:  Head: Normocephalic and atraumatic.  Mouth/Throat: No oropharyngeal exudate.  Poor dentition  Eyes: Conjunctivae and EOM are normal. No scleral icterus.  Neck: Neck supple. No thyromegaly present.  Bruits v transmitted murmur B  Cardiovascular: Normal rate and regular rhythm.   Murmur (3/6 crescendo/ decrescendo murmur throughout precordium, centered on RUSB) heard. Respiratory: Effort normal and breath sounds normal. He has no wheezes. He has no rales.  GI: Soft. Bowel sounds are normal. He exhibits no distension. There is no tenderness.  Musculoskeletal: He exhibits no edema.  Lymphadenopathy:    He has no cervical adenopathy.  Neurological: He is alert and oriented to person, place, and time. No cranial nerve deficit. He exhibits normal muscle tone.  No motor deficit  Skin: Skin is warm and dry.  Psychiatric: He has a normal mood and affect.   CARDIAC CATHETERIZATION Conclusion     Severe multivessel disease:  Prox LAD to Mid LAD lesion, 95% stenosed. Ramus lesion, 95% stenosed. Ost 1st Diag lesion, 90% stenosed.  Dist RCA lesion, 90% stenosed.  1st Mrg lesion, 60% stenosed. Dist LAD lesion, 40% stenosed.  There is mild left ventricular systolic dysfunction. There does appear to be a distal LAD distribution wall motion abnormality   Severe multivessel disease involving the ostial-proximal LAD and what appears to be either a bifurcating diagonal or ramus and diagonal branch. There is also focal distal RCA disease.  Based on the location of the LAD lesion, there is not adequate landing zone for a stent prior to the Left Main. Additionally, with involvement of 2 major branches this would be a very high-risk PCI. Overall best recommendation would be to pursue CABG.  The patient also has a significant aortic valve murmur with gradient noted by catheterization. Recommend echocardiogram to better assess left ventricular outflow/aortic valve stenosis. Pending the  severity of aortic valve disease, would consider potentially Performing Aortic Valve Replacement in Addition to CABG.    Assessment/Plan: 74 yo man with classic unstable coronary syndrome who has significant CAD involving the proximal LAD, the distal RCA and 2 RI branches one of which is a large vessel. He needs CABG for revascularization for symptom relief and survival benefit.   He also has a prominent murmur c/w AS. His gradient at cath was noted to be "~20-30 mmHg." He has an echo scheduled for today. I suspect he will need an AVR as well. If so, a tissue valve would be appropriate given his age.  I discussed the prposed operation to [name] [name]. We discussed the general nature of  the procedure, the need for general anesthesia, the use of cardiopulmonary bypass, and the incisions to be used. I discussed the expected hospital stay, overall recovery and short and long term outcomes. I reviewed the indications, risks, benefits and alternatives. They understand the risks include, but are not limited to death, stroke, MI, DVT/PE, bleeding, possible need for transfusion, infections, cardiac arrhythmias, CHB requiring pacemaker, and other organ system dysfunction including respiratory, renal, or GI complications. He accepts the risks and agrees to proceed.  He has poor dentition and has had dental work done about a year ago for gum disease. He has not seen a dentist since then. He does not have any current pain, bleeding or loose teeth.   [name] 09/24/2014, 2:09 PM

## 2014-09-25 ENCOUNTER — Inpatient Hospital Stay (HOSPITAL_COMMUNITY): Payer: Medicare Other

## 2014-09-25 ENCOUNTER — Other Ambulatory Visit (HOSPITAL_COMMUNITY): Payer: Medicare Other

## 2014-09-25 LAB — CBC
HCT: 42.7 % (ref 39.0–52.0)
Hemoglobin: 14.5 g/dL (ref 13.0–17.0)
MCH: 31.8 pg (ref 26.0–34.0)
MCHC: 34 g/dL (ref 30.0–36.0)
MCV: 93.6 fL (ref 78.0–100.0)
PLATELETS: 197 10*3/uL (ref 150–400)
RBC: 4.56 MIL/uL (ref 4.22–5.81)
RDW: 12.8 % (ref 11.5–15.5)
WBC: 7.7 10*3/uL (ref 4.0–10.5)

## 2014-09-25 LAB — HEPARIN LEVEL (UNFRACTIONATED)
HEPARIN UNFRACTIONATED: 0.95 [IU]/mL — AB (ref 0.30–0.70)
HEPARIN UNFRACTIONATED: 0.65 [IU]/mL (ref 0.30–0.70)
HEPARIN UNFRACTIONATED: 0.31 [IU]/mL (ref 0.30–0.70)

## 2014-09-25 NOTE — Progress Notes (Signed)
2 Days Post-Op Procedure(s) (LRB): Left Heart Cath and Coronary Angiography (N/A) Subjective: No complaints anxious  Objective: Vital signs in last 24 hours: Temp:  [97.3 F (36.3 C)-98.3 F (36.8 C)] 97.6 F (36.4 C) (09/04 1500) Pulse Rate:  [55-67] 58 (09/04 1600) Cardiac Rhythm:  [-] Normal sinus rhythm (09/04 1500) Resp:  [18-26] 26 (09/04 1600) BP: (129-165)/(69-102) 136/90 mmHg (09/04 1500) SpO2:  [93 %-98 %] 97 % (09/04 1600) Weight:  [185 lb 10 oz (84.2 kg)] 185 lb 10 oz (84.2 kg) (09/04 0455)  Hemodynamic parameters for last 24 hours:    Intake/Output from previous day: 09/03 0701 - 09/04 0700 In: 1049.6 [P.O.:840; I.V.:209.6] Out: 2425 [Urine:2425] Intake/Output this shift: Total I/O In: 669 [P.O.:570; I.V.:99] Out: 650 [Urine:650]  General appearance: alert, cooperative and no distress Neurologic: intact  Lab Results:  Recent Labs  09/24/14 0443 09/25/14 0333  WBC 8.5 7.7  HGB 13.7 14.5  HCT 39.8 42.7  PLT 198 197   BMET:  Recent Labs  09/23/14 1200 09/24/14 0443  NA 137 138  K 4.0 3.5  CL 108 108  CO2 21* 23  GLUCOSE 110* 103*  BUN 18 13  CREATININE 0.90 0.83  CALCIUM 9.4 8.6*    PT/INR: No results for input(s): LABPROT, INR in the last 72 hours. ABG No results found for: PHART, HCO3, TCO2, ACIDBASEDEF, O2SAT CBG (last 3)   Recent Labs  09/24/14 1924  GLUCAP 97    Assessment/Plan: S/P Procedure(s) (LRB): Left Heart Cath and Coronary Angiography (N/A) -  Reviewed echo with Dr. Stanford Breed. We agree that the valve appearance and gradient suggest at least moderate AS and he would benefit from AVR at time of CABG.  I ordered an orthopantogram which has been done but not read.   I think the wisest course of action is to have him seen by a dentist prior to AVR, so will arrange for that asap. Hopefully will just need a check and not any extractions. If so will probably be able to proceed with surgery on Wednesday   LOS: 2 days     [name] 09/25/2014

## 2014-09-25 NOTE — Progress Notes (Signed)
    Subjective:  Denies CP or dyspnea  Objective:  Filed Vitals:   09/25/14 0329 09/25/14 0400 09/25/14 0455 09/25/14 0752  BP: 141/96   165/102  Pulse: 58 55  63  Temp: 97.3 F (36.3 C)   97.8 F (36.6 C)  TempSrc: Oral   Oral  Resp: 20 19  18   Height:      Weight:   185 lb 10 oz (84.2 kg)   SpO2: 95% 94%  97%    Intake/Output from previous day:  Intake/Output Summary (Last 24 hours) at 09/25/14 0843 Last data filed at 09/25/14 0800  Gross per 24 hour  Intake 1199.63 ml  Output   2175 ml  Net -975.37 ml    Physical Exam: Physical exam: Well-developed well-nourished in no acute distress.  Skin is warm and dry.  HEENT is normal.  Neck is supple.  Chest is clear to auscultation with normal expansion.  Cardiovascular exam is regular rate and rhythm. 3/6 systolic murmur LSB Abdominal exam nontender or distended. No masses palpated. Extremities show no edema. Radial cath site with no hematoma neuro grossly intact    Lab Results: Basic Metabolic Panel:  Recent Labs  09/23/14 1200 09/24/14 0443  NA 137 138  K 4.0 3.5  CL 108 108  CO2 21* 23  GLUCOSE 110* 103*  BUN 18 13  CREATININE 0.90 0.83  CALCIUM 9.4 8.6*   CBC:  Recent Labs  09/24/14 0443 09/25/14 0333  WBC 8.5 7.7  HGB 13.7 14.5  HCT 39.8 42.7  MCV 92.8 93.6  PLT 198 197   Cardiac Enzymes:  Recent Labs  09/23/14 1720 09/23/14 2206 09/24/14 0443  TROPONINI 0.43* 0.57* 0.47*     Assessment/Plan:  1 non-ST elevation myocardial infarction-continue aspirin, heparin, metoprolol and statin. Cardiac catheterization revealed severe coronary disease. Dr [name] has evaluated and plans CABG/AVR. 2 aortic stenosis murmur-mean gradient 18 mmHg (approaching moderate); will need AVR at time of CABG; may need dental consult preoperatively 3 hypertension-continue present medications.  Kirk Ruths 09/25/2014, 8:43 AM

## 2014-09-25 NOTE — Progress Notes (Addendum)
[place] for heparin Indication: ACS / STEMI  No Known Allergies  Patient Measurements: Height: 6\' 2"  (188 cm) Weight: 185 lb 10 oz (84.2 kg) IBW/kg (Calculated) : 82.2  Vital Signs: Temp: 97.6 F (36.4 C) (09/04 1200) Temp Source: Oral (09/04 1200) BP: 151/90 mmHg (09/04 1200) Pulse Rate: 56 (09/04 1200)  Labs:  Recent Labs  09/23/14 1200 09/23/14 1720 09/23/14 2206 09/24/14 0443  09/24/14 2209 09/25/14 0333 09/25/14 0338 09/25/14 1321  HGB 15.9  --   --  13.7  --   --  14.5  --   --   HCT 45.3  --   --  39.8  --   --  42.7  --   --   PLT 210  --   --  198  --   --  197  --   --   HEPARINUNFRC  --   --   --   --   < > 0.64  --  0.95* 0.65  CREATININE 0.90  --   --  0.83  --   --   --   --   --   TROPONINI  --  0.43* 0.57* 0.47*  --   --   --   --   --   < > = values in this interval not displayed.  Estimated Creatinine Clearance: 89.4 mL/min (by C-G formula based on Cr of 0.83).  Assessment: 74 yo male with 3 vessel CAD s/p cath, awaiting possible CABG, for heparin. HL is now therapeutic at 0.65 after rate decrease this am. CBC wnl and stable with no reported overt s/s bleeding.   Goal of Therapy:  Heparin level 0.3-0.7 units/ml Monitor platelets by anticoagulation protocol: Yes   Plan:  Continue Heparin gtt at 1100 units/hr Check heparin level in 8 hours to confirm Daily HL/CBC Monitor for s/s bleeding F/u plans for CABG  Erika K. Velva Harman, PharmD, BCPS, CPP Clinical Pharmacist Pager: [phone] Phone: [phone] 09/25/2014 2:23 PM    ADDENDUM Heparin level therapeutic this evening at 0.31 units/mL. No bleeding issues.  Plan: -increase heparin to 1150 units/hr to ensure level stays therapeutic. -daily HL and CBC  Greysen Devino D. Jonnette Nuon, PharmD, BCPS Clinical Pharmacist Pager: [phone] 09/25/2014 10:16 PM

## 2014-09-25 NOTE — Progress Notes (Signed)
ANTICOAGULATION CONSULT NOTE - Follow Up Consult  Pharmacy Consult for Heparin  Indication: chest pain/ACS  No Known Allergies  Patient Measurements: Height: 6\' 2"  (188 cm) Weight: 185 lb 10 oz (84.2 kg) IBW/kg (Calculated) : 82.2  Vital Signs: Temp: 97.3 F (36.3 C) (09/04 0329) Temp Source: Oral (09/04 0329) BP: 141/96 mmHg (09/04 0329) Pulse Rate: 55 (09/04 0400)  Labs:  Recent Labs  09/23/14 1200 09/23/14 1720 09/23/14 2206 09/24/14 0443  09/24/14 1324 09/24/14 2209 09/25/14 0333 09/25/14 0338  HGB 15.9  --   --  13.7  --   --   --  14.5  --   HCT 45.3  --   --  39.8  --   --   --  42.7  --   PLT 210  --   --  198  --   --   --  197  --   HEPARINUNFRC  --   --   --   --   < > 0.44 0.64  --  0.95*  CREATININE 0.90  --   --  0.83  --   --   --   --   --   TROPONINI  --  0.43* 0.57* 0.47*  --   --   --   --   --   < > = values in this interval not displayed.  Estimated Creatinine Clearance: 89.4 mL/min (by C-G formula based on Cr of 0.83).    Assessment: Supra-therapeutic heparin level, no issues per RN  Goal of Therapy:  Heparin level 0.3-0.7 units/ml Monitor platelets by anticoagulation protocol: Yes   Plan:  -Decrease heparin to 1100 units/hr -1300 HL -Daily CBC/HL -Monitor for bleeding  Narda Bonds 09/25/2014,5:03 AM

## 2014-09-26 ENCOUNTER — Other Ambulatory Visit (HOSPITAL_COMMUNITY): Payer: Medicare Other

## 2014-09-26 LAB — CBC
HEMATOCRIT: 43 % (ref 39.0–52.0)
HEMOGLOBIN: 14.3 g/dL (ref 13.0–17.0)
MCH: 31.1 pg (ref 26.0–34.0)
MCHC: 33.3 g/dL (ref 30.0–36.0)
MCV: 93.5 fL (ref 78.0–100.0)
Platelets: 208 10*3/uL (ref 150–400)
RBC: 4.6 MIL/uL (ref 4.22–5.81)
RDW: 12.8 % (ref 11.5–15.5)
WBC: 7 10*3/uL (ref 4.0–10.5)

## 2014-09-26 LAB — HEPARIN LEVEL (UNFRACTIONATED): HEPARIN UNFRACTIONATED: 0.36 [IU]/mL (ref 0.30–0.70)

## 2014-09-26 NOTE — Progress Notes (Signed)
[facility] for heparin Indication: ACS / STEMI  No Known Allergies  Patient Measurements: Height: 6\' 2"  (188 cm) Weight: 185 lb 10 oz (84.2 kg) IBW/kg (Calculated) : 82.2  Vital Signs: Temp: 98.3 F (36.8 C) (09/05 0747) Temp Source: Oral (09/05 0747) BP: 150/94 mmHg (09/05 0747) Pulse Rate: 57 (09/05 0747)  Labs:  Recent Labs  09/23/14 1200 09/23/14 1720 09/23/14 2206 09/24/14 0443  09/25/14 0333  09/25/14 1321 09/25/14 2044 09/26/14 0225  HGB 15.9  --   --  13.7  --  14.5  --   --   --  14.3  HCT 45.3  --   --  39.8  --  42.7  --   --   --  43.0  PLT 210  --   --  198  --  197  --   --   --  208  HEPARINUNFRC  --   --   --   --   < >  --   < > 0.65 0.31 0.36  CREATININE 0.90  --   --  0.83  --   --   --   --   --   --   TROPONINI  --  0.43* 0.57* 0.47*  --   --   --   --   --   --   < > = values in this interval not displayed.  Estimated Creatinine Clearance: 89.4 mL/min (by C-G formula based on Cr of 0.83).  Assessment: 74 yo male with 3 vessel CAD s/p cath for CABG/AVR on Wednesday. He is on heparin and heparin level is  at goal (HL=0.36) on 1150 units/hr.  Goal of Therapy:  Heparin level 0.3-0.7 units/ml Monitor platelets by anticoagulation protocol: Yes   Plan:  Continue Heparin gtt at 1150units/hr Daily heparin level and CBC  Hildred Laser, Pharm D 09/26/2014 8:34 AM

## 2014-09-26 NOTE — Progress Notes (Signed)
    Subjective:  Denies CP or dyspnea  Objective:  Filed Vitals:   09/26/14 0000 09/26/14 0351 09/26/14 0400 09/26/14 0747  BP:  137/88  150/94  Pulse: 53 56 52 57  Temp:  97.7 F (36.5 C)  98.3 F (36.8 C)  TempSrc:  Oral  Oral  Resp: 25 17 10 14   Height:      Weight:      SpO2: 96% 97% 93% 98%    Intake/Output from previous day:  Intake/Output Summary (Last 24 hours) at 09/26/14 0754 Last data filed at 09/26/14 0700  Gross per 24 hour  Intake 988.38 ml  Output   2050 ml  Net -1061.62 ml    Physical Exam: Physical exam: Well-developed well-nourished in no acute distress.  Skin is warm and dry.  HEENT is normal.  Neck is supple.  Chest is clear to auscultation with normal expansion.  Cardiovascular exam is regular rate and rhythm. 3/6 systolic murmur LSB Abdominal exam nontender or distended. No masses palpated. Extremities show no edema. Radial cath site with no hematoma neuro grossly intact    Lab Results: Basic Metabolic Panel:  Recent Labs  09/23/14 1200 09/24/14 0443  NA 137 138  K 4.0 3.5  CL 108 108  CO2 21* 23  GLUCOSE 110* 103*  BUN 18 13  CREATININE 0.90 0.83  CALCIUM 9.4 8.6*   CBC:  Recent Labs  09/25/14 0333 09/26/14 0225  WBC 7.7 7.0  HGB 14.5 14.3  HCT 42.7 43.0  MCV 93.6 93.5  PLT 197 208   Cardiac Enzymes:  Recent Labs  09/23/14 1720 09/23/14 2206 09/24/14 0443  TROPONINI 0.43* 0.57* 0.47*     Assessment/Plan:  1 non-ST elevation myocardial infarction-continue aspirin, heparin, metoprolol and statin. Cardiac catheterization revealed severe coronary disease. Dr [name] has evaluated and plans CABG/AVR potentially Wed. 2 aortic stenosis murmur-mean gradient 18 mmHg (approaching moderate); will need AVR at time of CABG. 3 hypertension-continue present medications. Transfer to telemetry Kirk Ruths 09/26/2014, 7:54 AM

## 2014-09-26 NOTE — Care Management Important Message (Signed)
Important Message  Patient Details  Name: [name] [name] MRN: [id number] Date of Birth: [birth year]   Medicare Important Message Given:  Yes-second notification given    Loann Quill 09/26/2014, 8:43 AM

## 2014-09-27 ENCOUNTER — Encounter (HOSPITAL_COMMUNITY): Payer: Self-pay | Admitting: Cardiology

## 2014-09-27 ENCOUNTER — Ambulatory Visit (HOSPITAL_COMMUNITY): Payer: Medicare Other

## 2014-09-27 DIAGNOSIS — I2510 Atherosclerotic heart disease of native coronary artery without angina pectoris: Secondary | ICD-10-CM

## 2014-09-27 DIAGNOSIS — Z01810 Encounter for preprocedural cardiovascular examination: Secondary | ICD-10-CM

## 2014-09-27 DIAGNOSIS — Z01818 Encounter for other preprocedural examination: Secondary | ICD-10-CM

## 2014-09-27 DIAGNOSIS — I350 Nonrheumatic aortic (valve) stenosis: Secondary | ICD-10-CM

## 2014-09-27 LAB — ABO/RH: ABO/RH(D): O POS

## 2014-09-27 LAB — URINALYSIS, ROUTINE W REFLEX MICROSCOPIC
Glucose, UA: NEGATIVE mg/dL
Hgb urine dipstick: NEGATIVE
Ketones, ur: NEGATIVE mg/dL
Leukocytes, UA: NEGATIVE
NITRITE: NEGATIVE
PH: 6.5 (ref 5.0–8.0)
Protein, ur: NEGATIVE mg/dL
SPECIFIC GRAVITY, URINE: 1.019 (ref 1.005–1.030)
Urobilinogen, UA: 1 mg/dL (ref 0.0–1.0)

## 2014-09-27 LAB — CBC
HCT: 44.1 % (ref 39.0–52.0)
Hemoglobin: 15.1 g/dL (ref 13.0–17.0)
MCH: 31.9 pg (ref 26.0–34.0)
MCHC: 34.2 g/dL (ref 30.0–36.0)
MCV: 93 fL (ref 78.0–100.0)
PLATELETS: 212 10*3/uL (ref 150–400)
RBC: 4.74 MIL/uL (ref 4.22–5.81)
RDW: 12.7 % (ref 11.5–15.5)
WBC: 7.6 10*3/uL (ref 4.0–10.5)

## 2014-09-27 LAB — TYPE AND SCREEN
ABO/RH(D): O POS
ANTIBODY SCREEN: NEGATIVE

## 2014-09-27 LAB — PROTIME-INR
INR: 1.07 (ref 0.00–1.49)
PROTHROMBIN TIME: 14.1 s (ref 11.6–15.2)

## 2014-09-27 LAB — HEPARIN LEVEL (UNFRACTIONATED): HEPARIN UNFRACTIONATED: 0.42 [IU]/mL (ref 0.30–0.70)

## 2014-09-27 MED ORDER — PHENYLEPHRINE HCL 10 MG/ML IJ SOLN
30.0000 ug/min | INTRAVENOUS | Status: AC
  Administered 2014-09-28: 20 ug/min via INTRAVENOUS
  Filled 2014-09-27: qty 2

## 2014-09-27 MED ORDER — DEXTROSE 5 % IV SOLN
750.0000 mg | INTRAVENOUS | Status: DC
  Filled 2014-09-27: qty 750

## 2014-09-27 MED ORDER — DOPAMINE-DEXTROSE 3.2-5 MG/ML-% IV SOLN
0.0000 ug/kg/min | INTRAVENOUS | Status: AC
  Administered 2014-09-28: 3 ug/kg/min via INTRAVENOUS
  Filled 2014-09-27: qty 250

## 2014-09-27 MED ORDER — CHLORHEXIDINE GLUCONATE 4 % EX LIQD
60.0000 mL | Freq: Once | CUTANEOUS | Status: AC
  Administered 2014-09-28: 4 via TOPICAL

## 2014-09-27 MED ORDER — SODIUM CHLORIDE 0.9 % IV SOLN
INTRAVENOUS | Status: DC
  Filled 2014-09-27: qty 30

## 2014-09-27 MED ORDER — VANCOMYCIN HCL 10 G IV SOLR
1500.0000 mg | INTRAVENOUS | Status: AC
  Administered 2014-09-28: 1500 mg via INTRAVENOUS
  Filled 2014-09-27: qty 1500

## 2014-09-27 MED ORDER — SODIUM CHLORIDE 0.9 % IV SOLN
INTRAVENOUS | Status: AC
  Administered 2014-09-28: 69.8 mL/h via INTRAVENOUS
  Filled 2014-09-27: qty 40

## 2014-09-27 MED ORDER — TEMAZEPAM 15 MG PO CAPS: 15.0000 mg | ORAL_CAPSULE | Freq: Once | ORAL | Status: DC | PRN

## 2014-09-27 MED ORDER — PLASMA-LYTE 148 IV SOLN
INTRAVENOUS | Status: AC
  Administered 2014-09-28: 500 mL
  Filled 2014-09-27: qty 2.5

## 2014-09-27 MED ORDER — SODIUM CHLORIDE 0.9 % IV SOLN
INTRAVENOUS | Status: AC
  Administered 2014-09-28: 1 [IU]/h via INTRAVENOUS
  Filled 2014-09-27: qty 2.5

## 2014-09-27 MED ORDER — POTASSIUM CHLORIDE 2 MEQ/ML IV SOLN
80.0000 meq | INTRAVENOUS | Status: DC
  Filled 2014-09-27: qty 40

## 2014-09-27 MED ORDER — DIAZEPAM 5 MG PO TABS
5.0000 mg | ORAL_TABLET | Freq: Once | ORAL | Status: AC
  Administered 2014-09-28: 5 mg via ORAL
  Filled 2014-09-27: qty 1

## 2014-09-27 MED ORDER — CHLORHEXIDINE GLUCONATE 4 % EX LIQD
60.0000 mL | Freq: Once | CUTANEOUS | Status: AC
  Administered 2014-09-27: 4 via TOPICAL
  Filled 2014-09-27: qty 60

## 2014-09-27 MED ORDER — DEXMEDETOMIDINE HCL IN NACL 400 MCG/100ML IV SOLN
0.1000 ug/kg/h | INTRAVENOUS | Status: AC
  Administered 2014-09-28: .2 ug/kg/h via INTRAVENOUS
  Filled 2014-09-27: qty 100

## 2014-09-27 MED ORDER — ALPRAZOLAM 0.25 MG PO TABS: 0.2500 mg | ORAL_TABLET | ORAL | Status: DC | PRN

## 2014-09-27 MED ORDER — CHLORHEXIDINE GLUCONATE 0.12 % MT SOLN
15.0000 mL | Freq: Two times a day (BID) | OROMUCOSAL | Status: DC
  Administered 2014-09-27 (×2): 15 mL via OROMUCOSAL
  Filled 2014-09-27 (×2): qty 15

## 2014-09-27 MED ORDER — DEXTROSE 5 % IV SOLN
1.5000 g | INTRAVENOUS | Status: AC
  Administered 2014-09-28: .75 g via INTRAVENOUS
  Administered 2014-09-28: 1.5 g via INTRAVENOUS
  Filled 2014-09-27: qty 1.5

## 2014-09-27 MED ORDER — MAGNESIUM SULFATE 50 % IJ SOLN
40.0000 meq | INTRAMUSCULAR | Status: DC
  Filled 2014-09-27: qty 10

## 2014-09-27 MED ORDER — NITROGLYCERIN IN D5W 200-5 MCG/ML-% IV SOLN
2.0000 ug/min | INTRAVENOUS | Status: AC
  Administered 2014-09-28: 10 ug/min via INTRAVENOUS
  Filled 2014-09-27: qty 250

## 2014-09-27 MED ORDER — EPINEPHRINE HCL 1 MG/ML IJ SOLN
0.0000 ug/min | INTRAVENOUS | Status: DC
  Filled 2014-09-27: qty 4

## 2014-09-27 MED ORDER — METOPROLOL TARTRATE 12.5 MG HALF TABLET
12.5000 mg | ORAL_TABLET | Freq: Once | ORAL | Status: AC
  Administered 2014-09-28: 12.5 mg via ORAL
  Filled 2014-09-27: qty 1

## 2014-09-27 NOTE — Care Management Note (Addendum)
Case Management Note Marvetta Gibbons RN, BSN Unit 2W-Case Manager [phone]  Patient Details  Name: [name] [name] MRN: [id number] Date of Birth: 07-May-1940  Subjective/Objective:      Pt admitted with NSTEMI- per CATH found MVD-plan for CABG/AVR on 09/28/14              Action/Plan: PTA pt lived at home- NCM to follow post op for d/c needs/planning  Expected Discharge Date:                 Expected Discharge Plan:  [place]  In-House Referral:     Discharge planning Services  CM Consult  Post Acute Care Choice:    Choice offered to:     DME Arranged:    DME Agency:     HH Arranged:    [facility] Agency:     Status of Service:  In process, will continue to follow  Medicare Important Message Given:  Yes-second notification given Date Medicare IM Given:    Medicare IM give by:    Date Additional Medicare IM Given:    Additional Medicare Important Message give by:     If discussed at [place] of Stay Meetings, dates discussed:    Additional Comments:  Dawayne Patricia, RN 09/27/2014, 11:45 AM

## 2014-09-27 NOTE — Progress Notes (Signed)
    Subjective:  Denies dyspnea; CP earlier relieved with NTG  Objective:  Filed Vitals:   09/26/14 1513 09/26/14 2129 09/27/14 0521 09/27/14 0752  BP: 140/90 136/82 150/86 129/86  Pulse: 61 59 60 64  Temp: 98.1 F (36.7 C) 98.1 F (36.7 C) 98 F (36.7 C)   TempSrc: Oral Oral Oral   Resp: 22 20 20 18   Height:      Weight:      SpO2: 98% 95% 99% 99%    Intake/Output from previous day:  Intake/Output Summary (Last 24 hours) at 09/27/14 0756 Last data filed at 09/27/14 0523  Gross per 24 hour  Intake    296 ml  Output    500 ml  Net   -204 ml    Physical Exam: Physical exam: Well-developed well-nourished in no acute distress.  Skin is warm and dry.  HEENT is normal.  Neck is supple.  Chest is clear to auscultation with normal expansion.  Cardiovascular exam is regular rate and rhythm. 3/6 systolic murmur LSB Abdominal exam nontender or distended. No masses palpated. Extremities show no edema. Radial cath site with no hematoma neuro grossly intact    Lab Results: CBC:  Recent Labs  09/26/14 0225 09/27/14 0424  WBC 7.0 7.6  HGB 14.3 15.1  HCT 43.0 44.1  MCV 93.5 93.0  PLT 208 212    Assessment/Plan:  1 non-ST elevation myocardial infarction-continue aspirin, heparin, metoprolol and statin. Cardiac catheterization revealed severe coronary disease. Dr [name] has evaluated and plans CABG/AVR potentially Wed. Note pt with CP earlier relieved with NTG; if recurrent pain, will add IV NTG. 2 aortic stenosis murmur-mean gradient 18 mmHg (approaching moderate); will need AVR at time of CABG. 3 hypertension-continue present medications.  Kirk Ruths 09/27/2014, 7:56 AM

## 2014-09-27 NOTE — Progress Notes (Signed)
VASCULAR LAB PRELIMINARY  PRELIMINARY  PRELIMINARY  PRELIMINARY  Pre-op Cardiac Surgery  Carotid Findings:  Bilateral:  1-39% ICA stenosis.  Vertebral artery flow is antegrade.     Upper Extremity Right Left  Brachial Pressures 135 Triphasic 144 Triphasic  Radial Waveforms Triphasic Triphasic  Ulnar Waveforms Triphasic Triphasic  Palmar Arch (Allen's Test) Normal Normal   Findings:   Doppler waveforms remained normal bilaterally with both radial and ulnar compressions   Lower  Extremity Right Left  Dorsalis Pedis    Anterior Tibial    Posterior Tibial    Ankle/Brachial Indices      Findings:  Pedal pulses were palpable bilaterally at rest   Adrian Specht, RVS 09/27/2014, 11:50 AM

## 2014-09-27 NOTE — Progress Notes (Signed)
ANTICOAGULATION CONSULT NOTE Pharmacy Consult for heparin Indication: ACS / STEMI  No Known Allergies  Patient Measurements: Height: 6\' 2"  (188 cm) Weight: 185 lb 10 oz (84.2 kg) IBW/kg (Calculated) : 82.2  Vital Signs: Temp: 98 F (36.7 C) (09/06 0521) Temp Source: Oral (09/06 0521) BP: 129/86 mmHg (09/06 0752) Pulse Rate: 64 (09/06 0752)  Labs:  Recent Labs  09/25/14 0333  09/25/14 2044 09/26/14 0225 09/27/14 0424  HGB 14.5  --   --  14.3 15.1  HCT 42.7  --   --  43.0 44.1  PLT 197  --   --  208 212  HEPARINUNFRC  --   < > 0.31 0.36 0.42  < > = values in this interval not displayed.  Estimated Creatinine Clearance: 89.4 mL/min (by C-G formula based on Cr of 0.83).  Assessment: 74 yo male with 3 vessel CAD s/p cath last week, planning for potential CABG/AVR tomorrow. He is on heparin and heparin level is at goal on 1150 units/hr. CBC is stable.  Goal of Therapy:  Heparin level 0.3-0.7 units/ml Monitor platelets by anticoagulation protocol: Yes   Plan:  Continue Heparin gtt at 1150units/hr Daily heparin level and CBC    Hughes Better, PharmD, BCPS Clinical Pharmacist Pager: [phone] 09/27/2014 8:55 AM

## 2014-09-27 NOTE — Progress Notes (Signed)
Utilization review completed.  

## 2014-09-27 NOTE — Progress Notes (Signed)
0815 Since pt had CP earlier and had to get NTG x 3, we will hold ambulation and follow up after surgery. Pre op ed done Saturday. Graylon Good RN BSN 09/27/2014 8:15 AM

## 2014-09-27 NOTE — Consult Note (Signed)
DENTAL CONSULTATION  Date of Consultation:  09/27/2014 Patient Name:   [name] [name] Date of Birth:   Jul 29, 1940 Medical Record Number: [id number]  VITALS: BP 129/86 mmHg  Pulse 64  Temp(Src) 98 F (36.7 C) (Oral)  Resp 18  Ht 6\' 2"  (1.88 m)  Wt 185 lb 10 oz (84.2 kg)  BMI 23.82 kg/m2  SpO2 99%  CHIEF COMPLAINT: Patient referred by [name] for a dental consultation.  HPI: [name] [name] is a 74 year old male recently diagnosed with aortic valve stenosis and coronary artery disease. Patient with anticipated aortic valve replacement and coronary artery bypass graft procedure. Patient is now seen as part of a medically necessary pre-heart valve surgery dental protocol examination.  The patient currently denies acute toothaches, swellings, or abscesses. Patient was last seen by a Dentist approximately one year ago for an exam and cleaning. Patient saw Dr. Gerlene Burdock at that time. Patient usually sees the dentist on a yearly basis by report. Patient denies having any partial dentures.  PROBLEM LIST: Patient Active Problem List   Diagnosis Date Noted  . Unstable angina pectoris 09/23/2014  . Cardiac murmur, previously undiagnosed 09/23/2014  . 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 Unstable angina 09/23/2014  . Syncope and collapse 09/30/2013  . Syncope 09/30/2013  . Essential hypertension, benign 09/30/2013  . GERD (gastroesophageal reflux disease) 09/30/2013    PMH: Past Medical History  Diagnosis Date  . Hypertension   . Hyperlipidemia   . Shingles   . GERD (gastroesophageal reflux disease)   . Cataracts, bilateral   . Glaucoma     PSH: Past Surgical History  Procedure Laterality Date  . Cataract extraction    . Prostatectomy    . Cardiac catheterization N/A 09/23/2014    Procedure: Left Heart Cath and Coronary Angiography;  Surgeon: [name], MD;  Location: [facility];  Service: Cardiovascular;   Laterality: N/A;    ALLERGIES: No Known Allergies  MEDICATIONS: Current Facility-Administered Medications  Medication Dose Route Frequency Provider Last Rate Last Dose  . 0.9 %  sodium chloride infusion  250 mL Intravenous PRN [name], MD      . acetaminophen (TYLENOL) tablet 650 mg  650 mg Oral Q4H PRN Almyra Deforest, PA   650 mg at 09/23/14 1702  . acyclovir (ZOVIRAX) tablet 400 mg  400 mg Oral BID Almyra Deforest, PA   400 mg at 09/27/14 0953  . ALPRAZolam Duanne Moron) tablet 0.25-0.5 mg  0.25-0.5 mg Oral Q4H PRN [name], MD      . amLODipine (NORVASC) tablet 10 mg  10 mg Oral Daily Almyra Deforest, [place]   10 mg at 09/27/14 4970  . aspirin EC tablet 81 mg  81 mg Oral Daily Almyra Deforest, [place]   81 mg at 09/27/14 2637  . atorvastatin (LIPITOR) tablet 80 mg  80 mg Oral q1800 [name], MD   80 mg at 09/26/14 1729  . heparin ADULT infusion 100 units/mL (25000 units/250 mL)  1,150 Units/hr Intravenous Continuous Lauren D Bajbus, RPH 11.5 mL/hr at 09/26/14 1622 1,150 Units/hr at 09/26/14 1622  . meloxicam (MOBIC) tablet 15 mg  15 mg Oral Daily Almyra Deforest, [place]   15 mg at 09/27/14 0953  . metoprolol tartrate (LOPRESSOR) tablet 12.5 mg  12.5 mg Oral BID Lelon Perla, MD   12.5 mg at 09/27/14 8588  . morphine 2 MG/ML injection 1 mg  1 mg Intravenous Q1H PRN Shanon Brow  Loren Racer, MD      . multivitamin with minerals tablet 1 tablet  1 tablet Oral Daily Almyra Deforest, [place]   1 tablet at 09/27/14 [id number]  . nitroGLYCERIN (NITROSTAT) SL tablet 0.4 mg  0.4 mg Sublingual Q5 Min x 3 PRN Almyra Deforest, PA   0.4 mg at 09/27/14 0751  . ondansetron (ZOFRAN) injection 4 mg  4 mg Intravenous Q6H PRN Almyra Deforest, PA      . oxybutynin (DITROPAN-XL) 24 hr tablet 5 mg  5 mg Oral QHS Almyra Deforest, PA   5 mg at 09/26/14 2308  . pantoprazole (PROTONIX) EC tablet 40 mg  40 mg Oral Daily Almyra Deforest, [place]   40 mg at 09/27/14 7654  . prednisoLONE acetate (PRED FORTE) 1 % ophthalmic suspension 1 drop  1 drop Left Eye Daily [place], [place]   1 drop at 09/27/14  0954  . sodium chloride 0.9 % injection 3 mL  3 mL Intravenous Q12H [name], MD   3 mL at 09/27/14 1000  . sodium chloride 0.9 % injection 3 mL  3 mL Intravenous PRN [name], MD      . timolol (TIMOPTIC) 0.5 % ophthalmic solution 1 drop  1 drop Left Eye Daily [place], [place]   1 drop at 09/27/14 6503    LABS: Lab Results  Component Value Date   WBC 7.6 09/27/2014   HGB 15.1 09/27/2014   HCT 44.1 09/27/2014   MCV 93.0 09/27/2014   PLT 212 09/27/2014      Component Value Date/Time   NA 138 09/24/2014 0443   K 3.5 09/24/2014 0443   CL 108 09/24/2014 0443   CO2 23 09/24/2014 0443   GLUCOSE 103* 09/24/2014 0443   BUN 13 09/24/2014 0443   CREATININE 0.83 09/24/2014 0443   CALCIUM 8.6* 09/24/2014 0443   GFRNONAA >60 09/24/2014 0443   GFRAA >60 09/24/2014 0443   Lab Results  Component Value Date   INR 1.07 09/27/2014   INR 0.97 09/30/2013   No results found for: PTT  SOCIAL HISTORY: Social History   Social History  . Marital Status: Married    Spouse Name: N/A  . Number of Children: N/A  . Years of Education: N/A   Occupational History  . Not on file.   Social History Main Topics  . Smoking status: Never Smoker   . Smokeless tobacco: Not on file  . Alcohol Use: 0.0 oz/week    0 Standard drinks or equivalent per week  . Drug Use: No  . Sexual Activity: Yes   Other Topics Concern  . Not on file   Social History Narrative    FAMILY HISTORY: Family History  Problem Relation Age of Onset  . Ovarian cancer Mother     REVIEW OF SYSTEMS: Reviewed from chart for this admission.  DENTAL HISTORY: CHIEF COMPLAINT: Patient referred by [name] for a dental consultation.  HPI: [name] [name] is a 74 year old male recently diagnosed with aortic valve stenosis and coronary artery disease. Patient with anticipated aortic valve replacement and coronary artery bypass graft procedure. Patient is now seen as part of a medically necessary pre-heart  valve surgery dental protocol examination.  The patient currently denies acute toothaches, swellings, or abscesses. Patient was last seen by a Dentist approximately one year ago for an exam and cleaning. Patient saw Dr. Gerlene Burdock at that time. Patient usually sees the dentist on a yearly basis by report. Patient denies having any partial dentures.  DENTAL EXAMINATION: GENERAL: The patient is a well-developed, well-nourished male in no acute distress. HEAD AND NECK: There is no palpable submandibular lymphadenopathy. The patient denies acute TMJ symptoms. INTRAORAL EXAM: Patient has normal saliva. Patient has bilateral mandibular lingual tori. There is no evidence of oral abscess formation. DENTITION: Patient with multiple missing teeth numbers 1, 2, 3, 5, 14, 15, 16, 18, 19, 20, 29, 30, 31, and 32. There is evidence of mandibular tooth attrition. Multiple diastemas are noted. PERIODONTAL: The patient has chronic periodontitis with plaque and calculus accumulations, generalized gingival recession, and no significant tooth mobility.. DENTAL CARIES/SUBOPTIMAL RESTORATIONS: No obvious dental caries are noted. ENDODONTIC: Patient currently denies acute pulpitis symptoms. There is no evidence of periapical pathology. Patient has had previous root canal therapy associated with tooth #10. CROWN AND BRIDGE: Patient has multiple crown restorations that appear to be acceptable. PROSTHODONTIC: Patient denies having any partial dentures OCCLUSION: Patient has a poor occlusal scheme secondary to multiple missing teeth, supra-eruption and drifting of the unopposed teeth into the edentulous areas, multiple diastemas, mandibular tooth attrition, and class III malocclusion.  RADIOGRAPHIC INTERPRETATION: An orthopantogram was taken on 09/25/2014. There are multiple missing teeth. There is incipient to moderate bone loss. Radiographic calculus is noted. Multiple diastemas are noted. There is supra-eruption and drifting  of the unopposed teeth into the edentulous areas. There are no obvious periapical radiolucencies. Tooth #10 has a previous root canal therapy. Multiple crown restorations are noted on tooth numbers 6, 7, 8, 9, and 10. Maxillary sinuses are noted and appear to be well aerated. There is evidence of mandibular tooth attrition associated with tooth numbers 22, 26, 27, and 28.  ASSESSMENTS: 1. Aortic stenosis 2. Coronary artery disease 3. Pre-heart valve surgery dental protocol 4. Chronic periodontitis with bone loss 5. Gingival recession 6. Accretions 7. Mandibular anterior incipient tooth mobility 8. Mandibular occlusal and incisal attrition 9. Multiple missing teeth 10. Multiple diastemas 11. Supra-eruption and drifting of the unopposed teeth into the edentulous areas 12. Class III malocclusion 13. No history of partial dentures 14. Bilateral mandibular lingual tori   PLAN/RECOMMENDATIONS: 1. I discussed the risks, benefits, and complications of various treatment options with the patient in relationship to his medical and dental conditions, anticipated heart valve surgery, and risk for endocarditis. We discussed various treatment options to include no treatment, multiple extractions with alveoloplasty, pre-prosthetic surgery as indicated, periodontal therapy, dental restorations, root canal therapy, crown and bridge therapy, implant therapy, and replacement of missing teeth as indicated. The patient currently wishes to defer any dental treatment at this time. Patient is aware of the chronic periodontitis with mandibular anterior incipient tooth mobility by wishes to defer dental extraction of tooth numbers 24 and 25 this time. Patient indicates that he will follow-up with Dr. Gerlene Burdock for continued periodontal therapy once he is medically stable in 3-6 months. Patient understands that he will need antibiotic premedication prior to invasive dental procedures due to anticipated aortic valve  replacement.   2. Discussion of findings with medical team and coordination of future medical and dental care as needed. The patient is currently cleared for heart valve surgery.   Lenn Cal, DDS

## 2014-09-27 NOTE — Progress Notes (Signed)
4 Days Post-Op Procedure(s) (LRB): Left Heart Cath and Coronary Angiography (N/A) Subjective: No complaints. Denies CP  Objective: Vital signs in last 24 hours: Temp:  [97.8 F (36.6 C)-98.1 F (36.7 C)] 98 F (36.7 C) (09/06 0521) Pulse Rate:  [55-64] 64 (09/06 0752) Cardiac Rhythm:  [-] Sinus bradycardia (09/06 0720) Resp:  [18-23] 18 (09/06 0752) BP: (129-170)/(82-106) 129/86 mmHg (09/06 0752) SpO2:  [95 %-99 %] 99 % (09/06 0752)  Hemodynamic parameters for last 24 hours:    Intake/Output from previous day: 09/05 0701 - 09/06 0700 In: 296 [P.O.:250; I.V.:46] Out: 500 [Urine:500] Intake/Output this shift: Total I/O In: 240 [P.O.:240] Out: 650 [Urine:650]  General appearance: alert, cooperative and no distress Neurologic: intact Heart: regular rate and rhythm and murmur unchanged  Lab Results:  Recent Labs  09/26/14 0225 09/27/14 0424  WBC 7.0 7.6  HGB 14.3 15.1  HCT 43.0 44.1  PLT 208 212   BMET: No results for input(s): NA, K, CL, CO2, GLUCOSE, BUN, CREATININE, CALCIUM in the last 72 hours.  PT/INR: No results for input(s): LABPROT, INR in the last 72 hours. ABG No results found for: PHART, HCO3, TCO2, ACIDBASEDEF, O2SAT CBG (last 3)   Recent Labs  09/24/14 1924  GLUCAP 97    Assessment/Plan: S/P Procedure(s) (LRB): Left Heart Cath and Coronary Angiography (N/A) -  For AVR CABG in AM I spoke with Dr. Enrique Sack this AM- he will do a dental evaluation today Hopefully there is nothing that will cause [place] to have to postpone surgery  All questions about surgery answered   LOS: 4 days    [name] 09/27/2014

## 2014-09-27 NOTE — Anesthesia Preprocedure Evaluation (Addendum)
Anesthesia Evaluation  Patient identified by MRN, date of birth, ID band Patient awake    Reviewed: Allergy & Precautions, NPO status , Patient's Chart, lab work & pertinent test results  History of Anesthesia Complications Negative for: history of anesthetic complications  Airway Mallampati: II  TM Distance: >3 FB Neck ROM: Full    Dental  (+) Dental Advisory Given   Pulmonary neg pulmonary ROS,    breath sounds clear to auscultation       Cardiovascular hypertension, Pt. on medications + angina with exertion + CAD (severe 3v CAD)  + Valvular Problems/Murmurs AS  Rhythm:Regular Rate:Normal  9/16 ECHO: EF 55-60%, mild AS with AVA 2.12 cm2, Mean gradient (S): 17 mm Hg. Peak gradient (S): 28 mm Hg.     Neuro/Psych negative neurological ROS     GI/Hepatic Neg liver ROS, GERD  Medicated,  Endo/Other  negative endocrine ROS  Renal/GU negative Renal ROS     Musculoskeletal   Abdominal   Peds  Hematology negative hematology ROS (+)   Anesthesia Other Findings   Reproductive/Obstetrics                          Anesthesia Physical Anesthesia Plan  ASA: III  Anesthesia Plan: General   Post-op Pain Management:    Induction: Intravenous  Airway Management Planned: Oral ETT  Additional Equipment: Arterial line, CVP, PA Cath, 3D TEE and Ultrasound Guidance Line Placement  Intra-op Plan:   Post-operative Plan: Post-operative intubation/ventilation  Informed Consent: I have reviewed the patients History and Physical, chart, labs and discussed the procedure including the risks, benefits and alternatives for the proposed anesthesia with the patient or authorized representative who has indicated his/her understanding and acceptance.   Dental advisory given  Plan Discussed with: CRNA and Surgeon  Anesthesia Plan Comments: (Plan routine monitors, A line, PA cath, GETA with TEE and post op  ventilation)        Anesthesia Quick Evaluation

## 2014-09-27 NOTE — Progress Notes (Deleted)
Patient Name: [name] [name] Date of Encounter: 09/27/2014   SUBJECTIVE  He woke up with a left sided chest pain, described as dull achy that radiated to left arm. Somewhat similar to presentation but less severe 3-4/10. Given SL nitro x 3 ( given 3rd when I was in room). Currently 1/10 pain. BP stable. No palpitation, nausea or palpitation. Stable SOB.   CURRENT MEDS . acyclovir  400 mg Oral BID  . amLODipine  10 mg Oral Daily  . aspirin EC  81 mg Oral Daily  . atorvastatin  80 mg Oral q1800  . meloxicam  15 mg Oral Daily  . metoprolol tartrate  12.5 mg Oral BID  . multivitamin with minerals  1 tablet Oral Daily  . oxybutynin  5 mg Oral QHS  . pantoprazole  40 mg Oral Daily  . prednisoLONE acetate  1 drop Left Eye Daily  . sodium chloride  3 mL Intravenous Q12H  . timolol  1 drop Left Eye Daily    OBJECTIVE  Filed Vitals:   09/26/14 1200 09/26/14 1513 09/26/14 2129 09/27/14 0521  BP: 154/98 140/90 136/82 150/86  Pulse: 55 61 59 60  Temp:  98.1 F (36.7 C) 98.1 F (36.7 C) 98 F (36.7 C)  TempSrc:  Oral Oral Oral  Resp: 23 22 20 20   Height:      Weight:      SpO2: 96% 98% 95% 99%    Intake/Output Summary (Last 24 hours) at 09/27/14 0754 Last data filed at 09/27/14 0523  Gross per 24 hour  Intake    296 ml  Output    500 ml  Net   -204 ml   Filed Weights   09/23/14 1140 09/23/14 1636 09/25/14 0455  Weight: 185 lb (83.915 kg) 187 lb 9.8 oz (85.1 kg) 185 lb 10 oz (84.2 kg)    PHYSICAL EXAM  General: Pleasant, NAD. Neuro: Alert and oriented X 3. Moves all extremities spontaneously. Psych: Normal affect. HEENT:  Normal  Neck: Supple without bruits or JVD. Lungs:  Resp regular and unlabored, CTA. Heart: RRR no s3, s4. 3/6 systolic murmurs. Abdomen: Soft, non-tender, non-distended, BS + x 4.  Extremities: No clubbing, cyanosis or edema. DP/PT/Radials 2+ and equal bilaterally. Right radial cath site without hematoma.   Accessory Clinical  Findings  CBC  Recent Labs  09/26/14 0225 09/27/14 0424  WBC 7.0 7.6  HGB 14.3 15.1  HCT 43.0 44.1  MCV 93.5 93.0  PLT 208 212    TELE  Sinus bradycardia at rate of 50s  Radiology/Studies  Dg Orthopantogram  09/25/2014   CLINICAL DATA:  Dental disease  EXAM: ORTHOPANTOGRAM/PANORAMIC  COMPARISON:  None.  FINDINGS: Patient has 9 upper teeth centrally without evidence of abscess. Patient has eighth teeth in the lower mandible centrally. One LEFT lower molar. No periapical abscess.  IMPRESSION: Central upper and lower teeth without evidence of abscess.   Electronically Signed   By: Suzy Bouchard M.D.   On: 09/25/2014 16:52   Dg Chest 2 View  09/23/2014   CLINICAL DATA:  Shortness of breath and left-sided chest pain. Left arm pain.  EXAM: CHEST  2 VIEW  COMPARISON:  09/30/2013  FINDINGS: Cardiomediastinal silhouette is normal. Mediastinal contours appear intact. The aorta is torturous and contains atherosclerotic calcifications of the arch. There is stable eventration of the left hemidiaphragm. Bilateral fullness of the hilar regions is likely related to prominence of the main pulmonary arteries.  There is no evidence of focal airspace  consolidation, pleural effusion or pneumothorax.  Osseous structures are without acute abnormality. Soft tissues are grossly normal.  IMPRESSION: No active cardiopulmonary disease.  Bilateral hilar fullness likely secondary to prominent main pulmonary arteries.   Electronically Signed   By: Fidela Salisbury M.D.   On: 09/23/2014 12:11    ASSESSMENT AND PLAN   1 NSTEMI - Cath revealed severe coronary disease. Dr [name] has evaluated and plans CABG/AVR potentially Wed. - Continue aspirin, heparin, metoprolol and statin.  - Woke up with a CP, now improved after SL nitro X 3. Will continue to monitor for now. If symptoms persist, start IV nitro and get EKG.   2 Aortic stenosis murmur -Mean gradient 18 mmHg (approaching moderate) -AVR at time of  CABG.  3 hypertension - Stable  Signed, Matas Burrows PA-C

## 2014-09-28 ENCOUNTER — Inpatient Hospital Stay (HOSPITAL_COMMUNITY): Payer: Medicare Other | Admitting: Certified Registered"

## 2014-09-28 ENCOUNTER — Inpatient Hospital Stay (HOSPITAL_COMMUNITY): Payer: Medicare Other

## 2014-09-28 ENCOUNTER — Encounter (HOSPITAL_COMMUNITY)
Admission: EM | Disposition: A | Discharge status: Home / Self Care | Payer: Medicare Other | Source: Home / Self Care | Attending: Thoracic Surgery (Cardiothoracic Vascular Surgery)

## 2014-09-28 ENCOUNTER — Encounter (HOSPITAL_COMMUNITY): Payer: Self-pay | Admitting: Certified Registered Nurse Anesthetist

## 2014-09-28 DIAGNOSIS — Z951 Presence of aortocoronary bypass graft: Secondary | ICD-10-CM

## 2014-09-28 DIAGNOSIS — I350 Nonrheumatic aortic (valve) stenosis: Secondary | ICD-10-CM

## 2014-09-28 HISTORY — PX: TEE WITHOUT CARDIOVERSION: SHX5443

## 2014-09-28 HISTORY — PX: AORTIC VALVE REPLACEMENT: SHX41

## 2014-09-28 HISTORY — PX: CORONARY ARTERY BYPASS GRAFT: SHX141

## 2014-09-28 LAB — POCT I-STAT 3, ART BLOOD GAS (G3+)
ACID-BASE DEFICIT: 4 mmol/L — AB (ref 0.0–2.0)
Acid-base deficit: 2 mmol/L (ref 0.0–2.0)
Acid-base deficit: 4 mmol/L — ABNORMAL HIGH (ref 0.0–2.0)
Acid-base deficit: 5 mmol/L — ABNORMAL HIGH (ref 0.0–2.0)
BICARBONATE: 24.4 meq/L — AB (ref 20.0–24.0)
BICARBONATE: 21.4 meq/L (ref 20.0–24.0)
BICARBONATE: 20.5 meq/L (ref 20.0–24.0)
Bicarbonate: 22.7 mEq/L (ref 20.0–24.0)
Bicarbonate: 21 mEq/L (ref 20.0–24.0)
O2 SAT: 100 %
O2 SAT: 100 %
O2 SAT: 96 %
O2 SAT: 97 %
O2 SAT: 94 %
PCO2 ART: 38.8 mmHg (ref 35.0–45.0)
PCO2 ART: 39.3 mmHg (ref 35.0–45.0)
PCO2 ART: 38 mmHg (ref 35.0–45.0)
PCO2 ART: 36.3 mmHg (ref 35.0–45.0)
PH ART: 7.37 (ref 7.350–7.450)
PO2 ART: 469 mmHg — AB (ref 80.0–100.0)
PO2 ART: 80 mmHg (ref 80.0–100.0)
PO2 ART: 95 mmHg (ref 80.0–100.0)
Patient temperature: 36.5
Patient temperature: 36.7
TCO2: 26 mmol/L (ref 0–100)
TCO2: 24 mmol/L (ref 0–100)
TCO2: 23 mmol/L (ref 0–100)
TCO2: 22 mmol/L (ref 0–100)
TCO2: 22 mmol/L (ref 0–100)
pCO2 arterial: 37.5 mmHg (ref 35.0–45.0)
pH, Arterial: 7.408 (ref 7.350–7.450)
pH, Arterial: 7.356 (ref 7.350–7.450)
pH, Arterial: 7.348 — ABNORMAL LOW (ref 7.350–7.450)
pH, Arterial: 7.358 (ref 7.350–7.450)
pO2, Arterial: 333 mmHg — ABNORMAL HIGH (ref 80.0–100.0)
pO2, Arterial: 71 mmHg — ABNORMAL LOW (ref 80.0–100.0)

## 2014-09-28 LAB — POCT I-STAT, CHEM 8
BUN: 20 mg/dL (ref 6–20)
BUN: 21 mg/dL — ABNORMAL HIGH (ref 6–20)
BUN: 16 mg/dL (ref 6–20)
BUN: 16 mg/dL (ref 6–20)
BUN: 18 mg/dL (ref 6–20)
BUN: 16 mg/dL (ref 6–20)
BUN: 16 mg/dL (ref 6–20)
CALCIUM ION: 0.82 mmol/L — AB (ref 1.13–1.30)
CALCIUM ION: 0.91 mmol/L — AB (ref 1.13–1.30)
CALCIUM ION: 0.99 mmol/L — AB (ref 1.13–1.30)
CALCIUM ION: 0.96 mmol/L — AB (ref 1.13–1.30)
CALCIUM ION: 1.16 mmol/L (ref 1.13–1.30)
CHLORIDE: 105 mmol/L (ref 101–111)
CHLORIDE: 100 mmol/L — AB (ref 101–111)
CHLORIDE: 107 mmol/L (ref 101–111)
CREATININE: 0.6 mg/dL — AB (ref 0.61–1.24)
CREATININE: 0.5 mg/dL — AB (ref 0.61–1.24)
CREATININE: 0.6 mg/dL — AB (ref 0.61–1.24)
CREATININE: 0.6 mg/dL — AB (ref 0.61–1.24)
Calcium, Ion: 1.15 mmol/L (ref 1.13–1.30)
Calcium, Ion: 1.18 mmol/L (ref 1.13–1.30)
Chloride: 108 mmol/L (ref 101–111)
Chloride: 113 mmol/L — ABNORMAL HIGH (ref 101–111)
Chloride: 109 mmol/L (ref 101–111)
Chloride: 104 mmol/L (ref 101–111)
Creatinine, Ser: 0.7 mg/dL (ref 0.61–1.24)
Creatinine, Ser: 0.3 mg/dL — ABNORMAL LOW (ref 0.61–1.24)
Creatinine, Ser: 0.6 mg/dL — ABNORMAL LOW (ref 0.61–1.24)
GLUCOSE: 123 mg/dL — AB (ref 65–99)
GLUCOSE: 145 mg/dL — AB (ref 65–99)
GLUCOSE: 161 mg/dL — AB (ref 65–99)
GLUCOSE: 160 mg/dL — AB (ref 65–99)
GLUCOSE: 128 mg/dL — AB (ref 65–99)
Glucose, Bld: 104 mg/dL — ABNORMAL HIGH (ref 65–99)
Glucose, Bld: 100 mg/dL — ABNORMAL HIGH (ref 65–99)
HCT: 31 % — ABNORMAL LOW (ref 39.0–52.0)
HCT: 33 % — ABNORMAL LOW (ref 39.0–52.0)
HCT: 30 % — ABNORMAL LOW (ref 39.0–52.0)
HCT: 32 % — ABNORMAL LOW (ref 39.0–52.0)
HEMATOCRIT: 42 % (ref 39.0–52.0)
HEMATOCRIT: 39 % (ref 39.0–52.0)
HEMATOCRIT: 28 % — AB (ref 39.0–52.0)
HEMOGLOBIN: 14.3 g/dL (ref 13.0–17.0)
HEMOGLOBIN: 10.5 g/dL — AB (ref 13.0–17.0)
HEMOGLOBIN: 11.2 g/dL — AB (ref 13.0–17.0)
Hemoglobin: 13.3 g/dL (ref 13.0–17.0)
Hemoglobin: 9.5 g/dL — ABNORMAL LOW (ref 13.0–17.0)
Hemoglobin: 10.2 g/dL — ABNORMAL LOW (ref 13.0–17.0)
Hemoglobin: 10.9 g/dL — ABNORMAL LOW (ref 13.0–17.0)
POTASSIUM: 3.8 mmol/L (ref 3.5–5.1)
POTASSIUM: 3.9 mmol/L (ref 3.5–5.1)
POTASSIUM: 4.1 mmol/L (ref 3.5–5.1)
POTASSIUM: 3.9 mmol/L (ref 3.5–5.1)
Potassium: 4.2 mmol/L (ref 3.5–5.1)
Potassium: 5.3 mmol/L — ABNORMAL HIGH (ref 3.5–5.1)
Potassium: 3.6 mmol/L (ref 3.5–5.1)
SODIUM: 137 mmol/L (ref 135–145)
SODIUM: 137 mmol/L (ref 135–145)
SODIUM: 137 mmol/L (ref 135–145)
Sodium: 137 mmol/L (ref 135–145)
Sodium: 126 mmol/L — ABNORMAL LOW (ref 135–145)
Sodium: 130 mmol/L — ABNORMAL LOW (ref 135–145)
Sodium: 138 mmol/L (ref 135–145)
TCO2: 27 mmol/L (ref 0–100)
TCO2: 22 mmol/L (ref 0–100)
TCO2: 24 mmol/L (ref 0–100)
TCO2: 29 mmol/L (ref 0–100)
TCO2: 25 mmol/L (ref 0–100)
TCO2: 22 mmol/L (ref 0–100)
TCO2: 20 mmol/L (ref 0–100)

## 2014-09-28 LAB — BLOOD GAS, ARTERIAL
Acid-base deficit: 1.3 mmol/L (ref 0.0–2.0)
Bicarbonate: 22.7 mEq/L (ref 20.0–24.0)
DRAWN BY: 345601
FIO2: 21
O2 Saturation: 93.9 %
PCO2 ART: 36.8 mmHg (ref 35.0–45.0)
PH ART: 7.407 (ref 7.350–7.450)
PO2 ART: 72.2 mmHg — AB (ref 80.0–100.0)
Patient temperature: 98.6
TCO2: 23.8 mmol/L (ref 0–100)

## 2014-09-28 LAB — BASIC METABOLIC PANEL
ANION GAP: 7 (ref 5–15)
BUN: 19 mg/dL (ref 6–20)
CALCIUM: 8.8 mg/dL — AB (ref 8.9–10.3)
CO2: 26 mmol/L (ref 22–32)
Chloride: 103 mmol/L (ref 101–111)
Creatinine, Ser: 1.05 mg/dL (ref 0.61–1.24)
GFR calc Af Amer: >60 mL/min (ref >60)
GFR calc non Af Amer: >60 mL/min (ref >60)
GLUCOSE: 126 mg/dL — AB (ref 65–99)
Potassium: 3.6 mmol/L (ref 3.5–5.1)
Sodium: 136 mmol/L (ref 135–145)

## 2014-09-28 LAB — POCT I-STAT 4, (NA,K, GLUC, HGB,HCT)
Glucose, Bld: 131 mg/dL — ABNORMAL HIGH (ref 65–99)
HCT: 38 % — ABNORMAL LOW (ref 39.0–52.0)
HEMOGLOBIN: 12.9 g/dL — AB (ref 13.0–17.0)
Potassium: 3.5 mmol/L (ref 3.5–5.1)
Sodium: 141 mmol/L (ref 135–145)

## 2014-09-28 LAB — CBC
HEMATOCRIT: 42.7 % (ref 39.0–52.0)
HEMATOCRIT: 33.8 % — AB (ref 39.0–52.0)
HEMOGLOBIN: 11.6 g/dL — AB (ref 13.0–17.0)
Hemoglobin: 14.6 g/dL (ref 13.0–17.0)
MCH: 31.7 pg (ref 26.0–34.0)
MCH: 31.4 pg (ref 26.0–34.0)
MCHC: 34.2 g/dL (ref 30.0–36.0)
MCHC: 34.3 g/dL (ref 30.0–36.0)
MCV: 92.8 fL (ref 78.0–100.0)
MCV: 91.4 fL (ref 78.0–100.0)
PLATELETS: 202 10*3/uL (ref 150–400)
Platelets: 101 10*3/uL — ABNORMAL LOW (ref 150–400)
RBC: 4.6 MIL/uL (ref 4.22–5.81)
RBC: 3.7 MIL/uL — AB (ref 4.22–5.81)
RDW: 12.9 % (ref 11.5–15.5)
RDW: 12.6 % (ref 11.5–15.5)
WBC: 7.8 10*3/uL (ref 4.0–10.5)
WBC: 14 10*3/uL — AB (ref 4.0–10.5)

## 2014-09-28 LAB — APTT
APTT: 77 s — AB (ref 24–37)
APTT: 39 s — AB (ref 24–37)

## 2014-09-28 LAB — CREATININE, SERUM: CREATININE: 0.76 mg/dL (ref 0.61–1.24)

## 2014-09-28 LAB — HEPARIN LEVEL (UNFRACTIONATED): Heparin Unfractionated: 0.34 IU/mL (ref 0.30–0.70)

## 2014-09-28 LAB — PROTIME-INR
INR: 1.58 — ABNORMAL HIGH (ref 0.00–1.49)
Prothrombin Time: 18.9 seconds — ABNORMAL HIGH (ref 11.6–15.2)

## 2014-09-28 LAB — PLATELET COUNT: Platelets: 136 10*3/uL — ABNORMAL LOW (ref 150–400)

## 2014-09-28 LAB — MAGNESIUM: Magnesium: 2.9 mg/dL — ABNORMAL HIGH (ref 1.7–2.4)

## 2014-09-28 LAB — HEMOGLOBIN A1C
Hgb A1c MFr Bld: 5.8 % — ABNORMAL HIGH (ref 4.8–5.6)
Mean Plasma Glucose: 120 mg/dL

## 2014-09-28 LAB — HEMOGLOBIN AND HEMATOCRIT, BLOOD
HEMATOCRIT: 35.7 % — AB (ref 39.0–52.0)
HEMOGLOBIN: 12.3 g/dL — AB (ref 13.0–17.0)

## 2014-09-28 SURGERY — CORONARY ARTERY BYPASS GRAFTING (CABG)
Anesthesia: General | Site: Chest

## 2014-09-28 MED ORDER — DOPAMINE-DEXTROSE 3.2-5 MG/ML-% IV SOLN
3.0000 ug/kg/min | INTRAVENOUS | Status: DC
  Administered 2014-09-28: 3 ug/kg/min via INTRAVENOUS

## 2014-09-28 MED ORDER — MORPHINE SULFATE (PF) 2 MG/ML IV SOLN
2.0000 mg | INTRAVENOUS | Status: DC | PRN
  Administered 2014-09-28 – 2014-09-29 (×5): 2 mg via INTRAVENOUS
  Filled 2014-09-28 (×5): qty 1

## 2014-09-28 MED ORDER — ROCURONIUM BROMIDE 100 MG/10ML IV SOLN
INTRAVENOUS | Status: DC | PRN
  Administered 2014-09-28 (×2): 50 mg via INTRAVENOUS

## 2014-09-28 MED ORDER — METOPROLOL TARTRATE 25 MG/10 ML ORAL SUSPENSION
12.5000 mg | Freq: Two times a day (BID) | ORAL | Status: DC
  Filled 2014-09-28 (×3): qty 5

## 2014-09-28 MED ORDER — LACTATED RINGERS IV SOLN
INTRAVENOUS | Status: DC | PRN
  Administered 2014-09-28: 08:00:00 via INTRAVENOUS

## 2014-09-28 MED ORDER — VECURONIUM BROMIDE 10 MG IV SOLR
INTRAVENOUS | Status: AC
  Filled 2014-09-28: qty 10

## 2014-09-28 MED ORDER — MORPHINE SULFATE (PF) 2 MG/ML IV SOLN: 1.0000 mg | INTRAVENOUS | Status: AC | PRN

## 2014-09-28 MED ORDER — LACTATED RINGERS IV SOLN
INTRAVENOUS | Status: DC | PRN
  Administered 2014-09-28 (×2): via INTRAVENOUS

## 2014-09-28 MED ORDER — POTASSIUM CHLORIDE 10 MEQ/50ML IV SOLN
10.0000 meq | INTRAVENOUS | Status: AC
  Administered 2014-09-28 (×3): 10 meq via INTRAVENOUS

## 2014-09-28 MED ORDER — FENTANYL CITRATE (PF) 250 MCG/5ML IJ SOLN
INTRAMUSCULAR | Status: AC
  Filled 2014-09-28: qty 5

## 2014-09-28 MED ORDER — SUCCINYLCHOLINE CHLORIDE 20 MG/ML IJ SOLN
INTRAMUSCULAR | Status: AC
  Filled 2014-09-28: qty 2

## 2014-09-28 MED ORDER — FENTANYL CITRATE (PF) 100 MCG/2ML IJ SOLN
INTRAMUSCULAR | Status: DC | PRN
  Administered 2014-09-28: 750 ug via INTRAVENOUS
  Administered 2014-09-28: 200 ug via INTRAVENOUS
  Administered 2014-09-28: 250 ug via INTRAVENOUS
  Administered 2014-09-28: 50 ug via INTRAVENOUS
  Administered 2014-09-28: 250 ug via INTRAVENOUS

## 2014-09-28 MED ORDER — ACETAMINOPHEN 160 MG/5ML PO SOLN
1000.0000 mg | Freq: Four times a day (QID) | ORAL | Status: AC
  Filled 2014-09-28: qty 40

## 2014-09-28 MED ORDER — ONDANSETRON HCL 4 MG/2ML IJ SOLN
4.0000 mg | Freq: Four times a day (QID) | INTRAMUSCULAR | Status: DC | PRN
  Administered 2014-09-29: 4 mg via INTRAVENOUS
  Filled 2014-09-28: qty 2

## 2014-09-28 MED ORDER — PROTAMINE SULFATE 10 MG/ML IV SOLN
INTRAVENOUS | Status: DC | PRN
  Administered 2014-09-28: 250 mg via INTRAVENOUS

## 2014-09-28 MED ORDER — PROPOFOL 10 MG/ML IV BOLUS
INTRAVENOUS | Status: AC
  Filled 2014-09-28: qty 20

## 2014-09-28 MED ORDER — ASPIRIN EC 325 MG PO TBEC
325.0000 mg | DELAYED_RELEASE_TABLET | Freq: Every day | ORAL | Status: DC
  Administered 2014-09-29 – 2014-10-04 (×6): 325 mg via ORAL
  Filled 2014-09-28 (×6): qty 1

## 2014-09-28 MED ORDER — GLYCOPYRROLATE 0.2 MG/ML IJ SOLN
INTRAMUSCULAR | Status: DC | PRN
  Administered 2014-09-28: 0.4 mg via INTRAVENOUS

## 2014-09-28 MED ORDER — LACTATED RINGERS IV SOLN: 500.0000 mL | Freq: Once | INTRAVENOUS | Status: DC | PRN

## 2014-09-28 MED ORDER — ANTISEPTIC ORAL RINSE SOLUTION (CORINZ)
7.0000 mL | Freq: Four times a day (QID) | OROMUCOSAL | Status: DC
  Administered 2014-09-29 – 2014-09-30 (×4): 7 mL via OROMUCOSAL

## 2014-09-28 MED ORDER — LIDOCAINE HCL (CARDIAC) 20 MG/ML IV SOLN
INTRAVENOUS | Status: AC
  Filled 2014-09-28: qty 5

## 2014-09-28 MED ORDER — SODIUM CHLORIDE 0.45 % IV SOLN
INTRAVENOUS | Status: DC | PRN
  Administered 2014-09-28: 15:00:00 via INTRAVENOUS

## 2014-09-28 MED ORDER — FAMOTIDINE IN NACL 20-0.9 MG/50ML-% IV SOLN
20.0000 mg | Freq: Two times a day (BID) | INTRAVENOUS | Status: AC
  Administered 2014-09-28: 20 mg via INTRAVENOUS

## 2014-09-28 MED ORDER — ACETAMINOPHEN 500 MG PO TABS
1000.0000 mg | ORAL_TABLET | Freq: Four times a day (QID) | ORAL | Status: AC
  Administered 2014-09-29 – 2014-10-03 (×19): 1000 mg via ORAL
  Filled 2014-09-28 (×20): qty 2

## 2014-09-28 MED ORDER — 0.9 % SODIUM CHLORIDE (POUR BTL) OPTIME
TOPICAL | Status: DC | PRN
  Administered 2014-09-28: 5000 mL

## 2014-09-28 MED ORDER — ARTIFICIAL TEARS OP OINT
TOPICAL_OINTMENT | OPHTHALMIC | Status: DC | PRN
Start: 2014-09-28 — End: 2014-09-28
  Administered 2014-09-28: 1 via OPHTHALMIC

## 2014-09-28 MED ORDER — TRAMADOL HCL 50 MG PO TABS
50.0000 mg | ORAL_TABLET | ORAL | Status: DC | PRN
  Administered 2014-09-30: 100 mg via ORAL
  Administered 2014-10-04 (×3): 50 mg via ORAL
  Filled 2014-09-28: qty 2
  Filled 2014-09-28: qty 1
  Filled 2014-09-28: qty 2
  Filled 2014-09-28: qty 1

## 2014-09-28 MED ORDER — METOPROLOL TARTRATE 1 MG/ML IV SOLN: 2.5000 mg | INTRAVENOUS | Status: DC | PRN

## 2014-09-28 MED ORDER — SODIUM CHLORIDE 0.9 % IJ SOLN
3.0000 mL | Freq: Two times a day (BID) | INTRAMUSCULAR | Status: DC
Start: 2014-09-29 — End: 2014-09-30
  Administered 2014-09-29 – 2014-09-30 (×3): 3 mL via INTRAVENOUS

## 2014-09-28 MED ORDER — LACTATED RINGERS IV SOLN
INTRAVENOUS | Status: DC
  Administered 2014-09-28: 15:00:00 via INTRAVENOUS

## 2014-09-28 MED ORDER — VECURONIUM BROMIDE 10 MG IV SOLR
INTRAVENOUS | Status: DC | PRN
  Administered 2014-09-28 (×4): 5 mg via INTRAVENOUS

## 2014-09-28 MED ORDER — PLASMA-LYTE 148 IV SOLN
INTRAVENOUS | Status: DC
  Filled 2014-09-28: qty 2.5

## 2014-09-28 MED ORDER — ROCURONIUM BROMIDE 50 MG/5ML IV SOLN
INTRAVENOUS | Status: AC
  Filled 2014-09-28: qty 1

## 2014-09-28 MED ORDER — MIDAZOLAM HCL 5 MG/5ML IJ SOLN
INTRAMUSCULAR | Status: DC | PRN
  Administered 2014-09-28: 1 mg via INTRAVENOUS
  Administered 2014-09-28: 3 mg via INTRAVENOUS
  Administered 2014-09-28: 2 mg via INTRAVENOUS
  Administered 2014-09-28: 4 mg via INTRAVENOUS

## 2014-09-28 MED ORDER — SODIUM CHLORIDE 0.9 % IV SOLN
INTRAVENOUS | Status: DC
  Administered 2014-09-28: 10 mL via INTRAVENOUS

## 2014-09-28 MED ORDER — DEXMEDETOMIDINE HCL IN NACL 200 MCG/50ML IV SOLN: 0.0000 ug/kg/h | INTRAVENOUS | Status: DC

## 2014-09-28 MED ORDER — CEFUROXIME SODIUM 1.5 G IJ SOLR
1.5000 g | Freq: Two times a day (BID) | INTRAMUSCULAR | Status: AC
  Administered 2014-09-28 – 2014-09-30 (×4): 1.5 g via INTRAVENOUS
  Filled 2014-09-28 (×4): qty 1.5

## 2014-09-28 MED ORDER — MAGNESIUM SULFATE 4 GM/100ML IV SOLN
4.0000 g | Freq: Once | INTRAVENOUS | Status: AC
  Administered 2014-09-28: 4 g via INTRAVENOUS
  Filled 2014-09-28: qty 100

## 2014-09-28 MED ORDER — MIDAZOLAM HCL 2 MG/2ML IJ SOLN: 2.0000 mg | INTRAMUSCULAR | Status: DC | PRN

## 2014-09-28 MED ORDER — ACETAMINOPHEN 160 MG/5ML PO SOLN: 650.0000 mg | Freq: Once | ORAL | Status: AC

## 2014-09-28 MED ORDER — ACETAMINOPHEN 650 MG RE SUPP
650.0000 mg | Freq: Once | RECTAL | Status: AC
  Administered 2014-09-28: 650 mg via RECTAL

## 2014-09-28 MED ORDER — ASPIRIN 81 MG PO CHEW: 324.0000 mg | CHEWABLE_TABLET | Freq: Every day | ORAL | Status: DC

## 2014-09-28 MED ORDER — PROPOFOL 10 MG/ML IV BOLUS
INTRAVENOUS | Status: DC | PRN
  Administered 2014-09-28: 20 mg via INTRAVENOUS

## 2014-09-28 MED ORDER — SODIUM CHLORIDE 0.9 % IV SOLN: 250.0000 mL | INTRAVENOUS | Status: DC

## 2014-09-28 MED ORDER — PHENYLEPHRINE HCL 10 MG/ML IJ SOLN
0.0000 ug/min | INTRAVENOUS | Status: DC
  Administered 2014-09-28: 35 ug/min via INTRAVENOUS
  Administered 2014-09-28: 10 ug/min via INTRAVENOUS
  Filled 2014-09-28: qty 2

## 2014-09-28 MED ORDER — VANCOMYCIN HCL IN DEXTROSE 1-5 GM/200ML-% IV SOLN
1000.0000 mg | Freq: Once | INTRAVENOUS | Status: AC
Start: 2014-09-28 — End: 2014-09-28
  Administered 2014-09-28: 1000 mg via INTRAVENOUS
  Filled 2014-09-28: qty 200

## 2014-09-28 MED ORDER — ALBUMIN HUMAN 5 % IV SOLN
250.0000 mL | INTRAVENOUS | Status: AC | PRN
  Administered 2014-09-28 (×3): 250 mL via INTRAVENOUS
  Filled 2014-09-28: qty 250

## 2014-09-28 MED ORDER — DOCUSATE SODIUM 100 MG PO CAPS
200.0000 mg | ORAL_CAPSULE | Freq: Every day | ORAL | Status: DC
  Administered 2014-09-29 – 2014-10-02 (×4): 200 mg via ORAL
  Filled 2014-09-28 (×6): qty 2

## 2014-09-28 MED ORDER — HEMOSTATIC AGENTS (NO CHARGE) OPTIME
TOPICAL | Status: DC | PRN
  Administered 2014-09-28: 1 via TOPICAL

## 2014-09-28 MED ORDER — POTASSIUM CHLORIDE 10 MEQ/50ML IV SOLN
10.0000 meq | Freq: Once | INTRAVENOUS | Status: AC
  Administered 2014-09-28: 10 meq via INTRAVENOUS

## 2014-09-28 MED ORDER — INSULIN REGULAR BOLUS VIA INFUSION
0.0000 [IU] | Freq: Three times a day (TID) | INTRAVENOUS | Status: DC
  Filled 2014-09-28: qty 10

## 2014-09-28 MED ORDER — DEXMEDETOMIDINE HCL IN NACL 400 MCG/100ML IV SOLN
0.1000 ug/kg/h | INTRAVENOUS | Status: DC
  Filled 2014-09-28: qty 100

## 2014-09-28 MED ORDER — BISACODYL 10 MG RE SUPP: 10.0000 mg | Freq: Every day | RECTAL | Status: DC

## 2014-09-28 MED ORDER — GLYCOPYRROLATE 0.2 MG/ML IJ SOLN
INTRAMUSCULAR | Status: AC
  Filled 2014-09-28: qty 2

## 2014-09-28 MED ORDER — ALBUMIN HUMAN 5 % IV SOLN
INTRAVENOUS | Status: DC | PRN
  Administered 2014-09-28 (×5): via INTRAVENOUS

## 2014-09-28 MED ORDER — HEPARIN SODIUM (PORCINE) 1000 UNIT/ML IJ SOLN
INTRAMUSCULAR | Status: AC
  Filled 2014-09-28: qty 1

## 2014-09-28 MED ORDER — BISACODYL 5 MG PO TBEC
10.0000 mg | DELAYED_RELEASE_TABLET | Freq: Every day | ORAL | Status: DC
  Administered 2014-09-29 – 2014-10-01 (×3): 10 mg via ORAL
  Filled 2014-09-28 (×3): qty 2

## 2014-09-28 MED ORDER — CHLORHEXIDINE GLUCONATE 0.12% ORAL RINSE (MEDLINE KIT)
15.0000 mL | Freq: Two times a day (BID) | OROMUCOSAL | Status: DC
  Administered 2014-09-28: 15 mL via OROMUCOSAL

## 2014-09-28 MED ORDER — METOPROLOL TARTRATE 12.5 MG HALF TABLET
12.5000 mg | ORAL_TABLET | Freq: Two times a day (BID) | ORAL | Status: DC
  Filled 2014-09-28 (×3): qty 1

## 2014-09-28 MED ORDER — ARTIFICIAL TEARS OP OINT
TOPICAL_OINTMENT | OPHTHALMIC | Status: AC
  Filled 2014-09-28: qty 10.5

## 2014-09-28 MED ORDER — SODIUM CHLORIDE 0.9 % IJ SOLN: 3.0000 mL | INTRAMUSCULAR | Status: DC | PRN

## 2014-09-28 MED ORDER — OXYCODONE HCL 5 MG PO TABS
5.0000 mg | ORAL_TABLET | ORAL | Status: DC | PRN
  Administered 2014-09-29 – 2014-10-01 (×3): 10 mg via ORAL
  Filled 2014-09-28 (×4): qty 2

## 2014-09-28 MED ORDER — SODIUM CHLORIDE 0.9 % IJ SOLN
INTRAMUSCULAR | Status: AC
  Filled 2014-09-28: qty 10

## 2014-09-28 MED ORDER — PANTOPRAZOLE SODIUM 40 MG PO TBEC
40.0000 mg | DELAYED_RELEASE_TABLET | Freq: Every day | ORAL | Status: DC
  Administered 2014-09-30 – 2014-10-04 (×5): 40 mg via ORAL
  Filled 2014-09-28 (×5): qty 1

## 2014-09-28 MED ORDER — GELATIN ABSORBABLE MT POWD
OROMUCOSAL | Status: DC | PRN
  Administered 2014-09-28 (×3): 4 mL via TOPICAL

## 2014-09-28 MED ORDER — MIDAZOLAM HCL 10 MG/2ML IJ SOLN
INTRAMUSCULAR | Status: AC
  Filled 2014-09-28: qty 4

## 2014-09-28 MED ORDER — EPHEDRINE SULFATE 50 MG/ML IJ SOLN
INTRAMUSCULAR | Status: AC
  Filled 2014-09-28: qty 2

## 2014-09-28 MED ORDER — HEPARIN SODIUM (PORCINE) 1000 UNIT/ML IJ SOLN
INTRAMUSCULAR | Status: DC | PRN
  Administered 2014-09-28: 5000 [IU] via INTRAVENOUS
  Administered 2014-09-28: 28000 [IU] via INTRAVENOUS

## 2014-09-28 MED ORDER — NITROGLYCERIN IN D5W 200-5 MCG/ML-% IV SOLN: 0.0000 ug/min | INTRAVENOUS | Status: DC

## 2014-09-28 MED ORDER — SODIUM CHLORIDE 0.9 % IV SOLN
INTRAVENOUS | Status: DC
  Administered 2014-09-28: 1.7 [IU]/h via INTRAVENOUS
  Administered 2014-09-28: 3.6 [IU]/h via INTRAVENOUS
  Filled 2014-09-28: qty 2.5

## 2014-09-28 SURGICAL SUPPLY — 125 items
ADAPTER CARDIO PERF ANTE/RETRO (ADAPTER) ×4 IMPLANT
APPLICATOR COTTON TIP 6IN STRL (MISCELLANEOUS) IMPLANT
ARTERIAL PRESSURE LINE (MISCELLANEOUS) ×8 IMPLANT
BAG DECANTER FOR FLEXI CONT (MISCELLANEOUS) ×4 IMPLANT
BANDAGE ELASTIC 4 VELCRO ST LF (GAUZE/BANDAGES/DRESSINGS) ×4 IMPLANT
BANDAGE ELASTIC 6 VELCRO ST LF (GAUZE/BANDAGES/DRESSINGS) ×4 IMPLANT
BASKET HEART  (ORDER IN 25'S) (MISCELLANEOUS) ×1
BASKET HEART (ORDER IN 25'S) (MISCELLANEOUS) ×1
BASKET HEART (ORDER IN 25S) (MISCELLANEOUS) ×2 IMPLANT
BENZOIN TINCTURE PRP APPL 2/3 (GAUZE/BANDAGES/DRESSINGS) ×8 IMPLANT
BLADE STERNUM SYSTEM 6 (BLADE) ×4 IMPLANT
BLADE SURG 11 STRL SS (BLADE) ×4 IMPLANT
BLADE SURG 15 STRL LF DISP TIS (BLADE) ×4 IMPLANT
BLADE SURG 15 STRL SS (BLADE) ×4
BNDG GAUZE ELAST 4 BULKY (GAUZE/BANDAGES/DRESSINGS) ×4 IMPLANT
CANISTER SUCTION 2500CC (MISCELLANEOUS) ×4 IMPLANT
CANNULA EZ GLIDE AORTIC 21FR (CANNULA) ×4 IMPLANT
CANNULA GUNDRY RCSP 15FR (MISCELLANEOUS) ×4 IMPLANT
CATH CPB KIT HENDRICKSON (MISCELLANEOUS) ×4 IMPLANT
CATH HEART VENT LEFT (CATHETERS) ×2 IMPLANT
CATH ROBINSON RED A/P 18FR (CATHETERS) ×8 IMPLANT
CATH THORACIC 36FR (CATHETERS) ×4 IMPLANT
CATH THORACIC 36FR RT ANG (CATHETERS) ×8 IMPLANT
CLIP FOGARTY SPRING 6M (CLIP) IMPLANT
CLIP TI MEDIUM 24 (CLIP) IMPLANT
CLIP TI WIDE RED SMALL 24 (CLIP) ×4 IMPLANT
CLOSURE STERI-STRIP 1/2X4 (GAUZE/BANDAGES/DRESSINGS) ×1
CLSR STERI-STRIP ANTIMIC 1/2X4 (GAUZE/BANDAGES/DRESSINGS) ×3 IMPLANT
CONT SPEC 4OZ CLIKSEAL STRL BL (MISCELLANEOUS) ×4 IMPLANT
COVER MAYO STAND STRL (DRAPES) ×4 IMPLANT
CRADLE DONUT ADULT HEAD (MISCELLANEOUS) ×4 IMPLANT
DRAPE CARDIOVASCULAR INCISE (DRAPES) ×2
DRAPE SLUSH/WARMER DISC (DRAPES) ×4 IMPLANT
DRAPE SRG 135X102X78XABS (DRAPES) ×2 IMPLANT
DRSG COVADERM 4X14 (GAUZE/BANDAGES/DRESSINGS) ×4 IMPLANT
DRSG COVADERM 4X8 (GAUZE/BANDAGES/DRESSINGS) ×4 IMPLANT
DRSG KUZMA FLUFF (GAUZE/BANDAGES/DRESSINGS) ×4 IMPLANT
ELECT REM PT RETURN 9FT ADLT (ELECTROSURGICAL) ×8
ELECTRODE REM PT RTRN 9FT ADLT (ELECTROSURGICAL) ×4 IMPLANT
GAUZE SPONGE 4X4 12PLY STRL (GAUZE/BANDAGES/DRESSINGS) ×8 IMPLANT
GLOVE BIO SURGEON STRL SZ8 (GLOVE) ×4 IMPLANT
GLOVE BIOGEL PI IND STRL 6.5 (GLOVE) ×10 IMPLANT
GLOVE BIOGEL PI INDICATOR 6.5 (GLOVE) ×10
GLOVE SURG SIGNA 7.5 PF LTX (GLOVE) ×12 IMPLANT
GOWN STRL REUS W/ TWL LRG LVL3 (GOWN DISPOSABLE) ×8 IMPLANT
GOWN STRL REUS W/ TWL XL LVL3 (GOWN DISPOSABLE) ×4 IMPLANT
GOWN STRL REUS W/TWL LRG LVL3 (GOWN DISPOSABLE) ×8
GOWN STRL REUS W/TWL XL LVL3 (GOWN DISPOSABLE) ×4
HEMOSTAT POWDER SURGIFOAM 1G (HEMOSTASIS) ×12 IMPLANT
HEMOSTAT SURGICEL 2X14 (HEMOSTASIS) ×4 IMPLANT
INSERT FOGARTY XLG (MISCELLANEOUS) IMPLANT
KIT BASIN OR (CUSTOM PROCEDURE TRAY) ×4 IMPLANT
KIT ROOM TURNOVER OR (KITS) ×4 IMPLANT
KIT SUCTION CATH 14FR (SUCTIONS) ×8 IMPLANT
KIT VASOVIEW W/TROCAR VH 2000 (KITS) ×4 IMPLANT
LINE VENT (MISCELLANEOUS) ×4 IMPLANT
MARKER GRAFT CORONARY BYPASS (MISCELLANEOUS) ×12 IMPLANT
NEEDLE 25GX 5/8IN NON SAFETY (NEEDLE) ×4 IMPLANT
NS IRRIG 1000ML POUR BTL (IV SOLUTION) ×20 IMPLANT
PACK OPEN HEART (CUSTOM PROCEDURE TRAY) ×4 IMPLANT
PAD ARMBOARD 7.5X6 YLW CONV (MISCELLANEOUS) ×8 IMPLANT
PAD ELECT DEFIB RADIOL ZOLL (MISCELLANEOUS) ×4 IMPLANT
PENCIL BUTTON HOLSTER BLD 10FT (ELECTRODE) ×4 IMPLANT
PUNCH AORTIC ROTATE  4.5MM 8IN (MISCELLANEOUS) ×4 IMPLANT
PUNCH AORTIC ROTATE 4.0MM (MISCELLANEOUS) IMPLANT
PUNCH AORTIC ROTATE 4.5MM 8IN (MISCELLANEOUS) IMPLANT
PUNCH AORTIC ROTATE 5MM 8IN (MISCELLANEOUS) IMPLANT
SET CARDIOPLEGIA MPS 5001102 (MISCELLANEOUS) ×4 IMPLANT
SPONGE GAUZE 4X4 12PLY STER LF (GAUZE/BANDAGES/DRESSINGS) ×8 IMPLANT
SUT BONE WAX W31G (SUTURE) ×4 IMPLANT
SUT ETHIBON 2 0 V 52N 30 (SUTURE) ×8 IMPLANT
SUT ETHIBON EXCEL 2-0 V-5 (SUTURE) IMPLANT
SUT ETHIBOND 2 0 SH (SUTURE) ×10
SUT ETHIBOND 2 0 SH 36X2 (SUTURE) ×10 IMPLANT
SUT ETHIBOND 2 0 V4 (SUTURE) IMPLANT
SUT ETHIBOND 2 0V4 GREEN (SUTURE) IMPLANT
SUT ETHIBOND 4 0 RB 1 (SUTURE) IMPLANT
SUT ETHIBOND V-5 VALVE (SUTURE) IMPLANT
SUT MNCRL AB 4-0 PS2 18 (SUTURE) ×4 IMPLANT
SUT PROLENE 3 0 SH 1 (SUTURE) IMPLANT
SUT PROLENE 3 0 SH DA (SUTURE) ×4 IMPLANT
SUT PROLENE 4 0 RB 1 (SUTURE) ×14
SUT PROLENE 4 0 SH DA (SUTURE) IMPLANT
SUT PROLENE 4-0 RB1 .5 CRCL 36 (SUTURE) ×14 IMPLANT
SUT PROLENE 5 0 C 1 36 (SUTURE) ×8 IMPLANT
SUT PROLENE 6 0 C 1 30 (SUTURE) ×24 IMPLANT
SUT PROLENE 7 0 BV 1 (SUTURE) ×8 IMPLANT
SUT PROLENE 7 0 BV1 MDA (SUTURE) ×12 IMPLANT
SUT PROLENE 8 0 BV175 6 (SUTURE) ×8 IMPLANT
SUT SILK  1 MH (SUTURE) ×8
SUT SILK 1 MH (SUTURE) ×8 IMPLANT
SUT SILK 1 TIES 10X30 (SUTURE) ×4 IMPLANT
SUT SILK 2 0 SH CR/8 (SUTURE) ×8 IMPLANT
SUT SILK 2 0 TIES 10X30 (SUTURE) ×4 IMPLANT
SUT SILK 2 0 TIES 17X18 (SUTURE) ×2
SUT SILK 2-0 18XBRD TIE BLK (SUTURE) ×2 IMPLANT
SUT SILK 3 0 SH CR/8 (SUTURE) ×4 IMPLANT
SUT SILK 4 0 TIE 10X30 (SUTURE) ×8 IMPLANT
SUT STEEL 6MS V (SUTURE) ×4 IMPLANT
SUT STEEL STERNAL CCS#1 18IN (SUTURE) IMPLANT
SUT STEEL SZ 6 DBL 3X14 BALL (SUTURE) ×4 IMPLANT
SUT TEM PAC WIRE 2 0 SH (SUTURE) ×16 IMPLANT
SUT VIC AB 1 CTX 36 (SUTURE) ×4
SUT VIC AB 1 CTX36XBRD ANBCTR (SUTURE) ×4 IMPLANT
SUT VIC AB 2-0 CT1 27 (SUTURE) ×2
SUT VIC AB 2-0 CT1 TAPERPNT 27 (SUTURE) ×2 IMPLANT
SUT VIC AB 2-0 CTX 27 (SUTURE) ×8 IMPLANT
SUT VIC AB 3-0 SH 27 (SUTURE)
SUT VIC AB 3-0 SH 27X BRD (SUTURE) IMPLANT
SUT VIC AB 3-0 X1 27 (SUTURE) ×8 IMPLANT
SUT VICRYL 4-0 PS2 18IN ABS (SUTURE) IMPLANT
SUTURE E-PAK OPEN HEART (SUTURE) ×4 IMPLANT
SYR 10ML KIT SKIN ADHESIVE (MISCELLANEOUS) IMPLANT
SYSTEM SAHARA CHEST DRAIN ATS (WOUND CARE) ×4 IMPLANT
TAPE CLOTH SURG 4X10 WHT LF (GAUZE/BANDAGES/DRESSINGS) ×4 IMPLANT
TAPE PAPER 2X10 WHT MICROPORE (GAUZE/BANDAGES/DRESSINGS) ×4 IMPLANT
TOWEL OR 17X24 6PK STRL BLUE (TOWEL DISPOSABLE) ×8 IMPLANT
TOWEL OR 17X26 10 PK STRL BLUE (TOWEL DISPOSABLE) ×8 IMPLANT
TRAY FOLEY IC TEMP SENS 16FR (CATHETERS) ×4 IMPLANT
TUBE FEEDING 8FR 16IN STR KANG (MISCELLANEOUS) ×4 IMPLANT
TUBING INSUFFLATION (TUBING) ×4 IMPLANT
UNDERPAD 30X30 INCONTINENT (UNDERPADS AND DIAPERS) ×4 IMPLANT
VALVE MAGNA EASE AORTIC 25MM (Prosthesis & Implant Heart) ×4 IMPLANT
VENT LEFT HEART 12002 (CATHETERS) ×4
WATER STERILE IRR 1000ML POUR (IV SOLUTION) ×8 IMPLANT

## 2014-09-28 NOTE — Interval H&P Note (Signed)
History and Physical Interval Note:  Dental exam OK  09/28/2014 8:08 AM  [name] [name]  has presented today for surgery, with the diagnosis of AS CAD  The various methods of treatment have been discussed with the patient and family. After consideration of risks, benefits and other options for treatment, the patient has consented to  Procedure(s): CORONARY ARTERY BYPASS GRAFTING (CABG) (N/A) AORTIC VALVE REPLACEMENT (AVR) (N/A) TRANSESOPHAGEAL ECHOCARDIOGRAM (TEE) (N/A) as a surgical intervention .  The patient's history has been reviewed, patient examined, no change in status, stable for surgery.  I have reviewed the patient's chart and labs.  Questions were answered to the patient's satisfaction.     [name]

## 2014-09-28 NOTE — Brief Op Note (Addendum)
09/23/2014 - 09/28/2014  1:04 PM  PATIENT:  [name] [name]  74 y.o. male  PRE-OPERATIVE DIAGNOSIS:  Aortic Stenosis, Coronary Artery Disease  POST-OPERATIVE DIAGNOSIS:  Aortic Stenosis, Coronary Artery Disease  PROCEDURE:   AORTIC VALVE REPLACEMENT (25 mm [facility] Ease pericardial tissue valve) Serial # [id number] CORONARY ARTERY BYPASS GRAFTING x 4   LIMA - LAD  Sequential SVG - Ramus-OM1  SVG  - PD ENDOSCOPIC GREATER SAPHENOUS VEIN HARVEST RIGHT THIGH    SURGEON:  [name], MD  ASSISTANT: Suzzanne Cloud, PA-C  ANESTHESIA:   general  EBL: see anesthesia and perfusion records  PATIENT CONDITION:  ICU - intubated and hemodynamically stable.  PRE-OPERATIVE WEIGHT: 82.9 kg   Aortic Valve Etiology   Aortic Insufficiency:  Trivial/Trace  Aortic Valve Disease:  Yes.  Aortic Stenosis:  Yes. Smallest Aortic Valve Area: 0.93 cm2; Highest Mean Gradient: 17 mmHg.  Etiology (Choose at least one and up to  5 etiologies):  Degenerative - Calcified  Aortic Valve  Procedure Performed:  Replacement: Yes.  Bioprosthetic Valve. Implant Model Number:[id number], Size:25, Unique Device Identifier:[id number]  Repair/Reconstruction: No.   Aortic Annular Enlargement: No.

## 2014-09-28 NOTE — Anesthesia Procedure Notes (Signed)
Procedure Name: Intubation Date/Time: 09/28/2014 7:44 AM Performed by: Rogers Blocker Pre-anesthesia Checklist: Patient identified, Timeout performed, Emergency Drugs available, Suction available and Patient being monitored Patient Re-evaluated:Patient Re-evaluated prior to inductionOxygen Delivery Method: Circle system utilized Preoxygenation: Pre-oxygenation with 100% oxygen Intubation Type: IV induction Ventilation: Mask ventilation without difficulty Laryngoscope Size: Mac and 4 Grade View: Grade II Tube type: Oral Number of attempts: 1 Airway Equipment and Method: Stylet Secured at: 21 cm Tube secured with: Tape Dental Injury: Teeth and Oropharynx as per pre-operative assessment  Comments: Intubation by Harless Nakayama, SRNA

## 2014-09-28 NOTE — Procedures (Signed)
Extubation Procedure Note  Patient Details:   Name: [name] [name] DOB: 1940/11/05 MRN: [id number]   Airway Documentation:  Airway (Active)  Secured at (cm) 21 cm 09/28/2014  3:00 PM  Measured From Lips 09/28/2014  3:00 PM  Secured Location Right 09/28/2014  3:00 PM  Secured By Pink Tape 09/28/2014  3:00 PM    Evaluation  O2 sats: stable throughout Complications: No apparent complications Patient did tolerate procedure well. Bilateral Breath Sounds: Clear Suctioning: Airway Yes  Vital signs stable, positive cuff leak  [place] R Darvis Croft 09/28/2014, 6:51 PM

## 2014-09-28 NOTE — Progress Notes (Signed)
CT surgery p.m. Rounds  Status post aVR CABG Extubated breathing comfortably Hemodynamics stable, a pacing Minimal chest tube drainage Postop laboratory values satisfactory

## 2014-09-28 NOTE — Transfer of Care (Signed)
Immediate Anesthesia Transfer of Care Note  Patient: [name] [name]  Procedure(s) Performed: Procedure(s): CORONARY ARTERY BYPASS GRAFTING (CABG) x4 using left internal mammory artery and right saphenous leg vein harvested endoscopically. (N/A) AORTIC VALVE REPLACEMENT (AVR) (N/A) TRANSESOPHAGEAL ECHOCARDIOGRAM (TEE) (N/A)  Patient Location: SICU  Anesthesia Type:General  Level of Consciousness: sedated, unresponsive and Patient remains intubated per anesthesia plan  Airway & Oxygen Therapy: Patient remains intubated per anesthesia plan and Patient placed on Ventilator (see vital sign flow sheet for setting)  Post-op Assessment: Report given to RN and Post -op Vital signs reviewed and stable  Post vital signs: Reviewed and stable  Last Vitals:  Filed Vitals:   09/28/14 0544  BP: 129/75  Pulse: 58  Temp: 37.1 C  Resp: 18    Complications: No apparent anesthesia complications

## 2014-09-28 NOTE — Progress Notes (Signed)
  Echocardiogram Echocardiogram Transesophageal has been performed.  Donata Clay 09/28/2014, 10:37 AM

## 2014-09-28 NOTE — H&P (View-Only) (Signed)
Reason for Consult:CAD with unstable coronary syndrome Referring Physician: Dr. [name] is an 74 y.o. male.  HPI: 74 yo man presents with a chief complaint of chest pain.  [name] [name] is a 74 yo man with no prior history of CAD. He has a history of hypertension, hyperlipidemia, syncope (x 1 about a year ago), GERD, and a hear murmur. He has been having chest pressure and pain in his upper left arm with exertion for the past week. This was relieved with rest after a few minutes. He would feel SOB when it occurred. It was becoming more frequent, and then  yesterday he had a severe episode after lifting ~ 20 pounds. This lasted longer and was more severe. It also resolved spontaneously. He told his wife about and she brought him to the ED.   On arrival he was pain free. Initial troponin was negative, but later bumped to 0.57. ECG did not show ST-T wave changes. He was admitted and had cardiac catheterization. It revealed tight LAD and distal RCA stenoses. There were 2 ramus branches that both had ostial disease. There was moderate disease in OM1. He has had a couple of "twinges of pain" since admission, but nothing lasting more than a few seconds.   Past Medical History  Diagnosis Date  . Hypertension   . Hyperlipidemia   . Shingles   . GERD (gastroesophageal reflux disease)   . Cataracts, bilateral   . Glaucoma     Past Surgical History  Procedure Laterality Date  . Cataract extraction    . Prostatectomy      Family History  Problem Relation Age of Onset  . Ovarian cancer Mother     Social History:  reports that he has never smoked. He does not have any smokeless tobacco history on file. He reports that he drinks alcohol. He reports that he does not use illicit drugs.  Allergies: No Known Allergies  Medications:  Prior to Admission:  Prescriptions prior to admission  Medication Sig Dispense Refill Last Dose  . acyclovir (ZOVIRAX) 800 MG tablet Take 400 mg by  mouth 2 (two) times daily.   09/23/2014 at Unknown time  . amLODipine (NORVASC) 10 MG tablet Take 10 mg by mouth daily.   09/22/2014 at Unknown time  . aspirin EC 81 MG tablet Take 81 mg by mouth daily.   09/23/2014 at Unknown time  . lansoprazole (PREVACID) 30 MG capsule Take 30 mg by mouth daily at 12 noon.   09/22/2014 at Unknown time  . lisinopril (PRINIVIL,ZESTRIL) 40 MG tablet Take 40 mg by mouth daily.   09/22/2014 at Unknown time  . meloxicam (MOBIC) 15 MG tablet Take 15 mg by mouth daily.   09/22/2014 at Unknown time  . Multiple Vitamins-Minerals (MULTIVITAMIN WITH MINERALS) tablet Take 1 tablet by mouth daily.   09/22/2014 at Unknown time  . oxybutynin (DITROPAN-XL) 5 MG 24 hr tablet Take 5 mg by mouth at bedtime.   09/22/2014 at Unknown time  . prednisoLONE acetate (PRED FORTE) 1 % ophthalmic suspension Place 1 drop into the left eye daily.   09/22/2014 at Unknown time  . sildenafil (REVATIO) 20 MG tablet Take 20 mg by mouth daily as needed.   09/22/2014 at Unknown time  . simvastatin (ZOCOR) 40 MG tablet Take 20 mg by mouth daily.   09/22/2014 at Unknown time  . timolol (BETIMOL) 0.5 % ophthalmic solution Place 1 drop into the left eye daily.   09/23/2014 at Unknown time  Results for orders placed or performed during the hospital encounter of 09/23/14 (from the past 48 hour(s))  Basic metabolic panel     Status: Abnormal   Collection Time: 09/23/14 12:00 PM  Result Value Ref Range   Sodium 137 135 - 145 mmol/L   Potassium 4.0 3.5 - 5.1 mmol/L   Chloride 108 101 - 111 mmol/L   CO2 21 (L) 22 - 32 mmol/L   Glucose, Bld 110 (H) 65 - 99 mg/dL   BUN 18 6 - 20 mg/dL   Creatinine, Ser 0.90 0.61 - 1.24 mg/dL   Calcium 9.4 8.9 - 10.3 mg/dL   GFR calc non Af Amer >60 >60 mL/min   GFR calc Af Amer >60 >60 mL/min    Comment: (NOTE) The eGFR has been calculated using the CKD EPI equation. This calculation has not been validated in all clinical situations. eGFR's persistently <60 mL/min signify possible  Chronic Kidney Disease.    Anion gap 8 5 - 15  CBC     Status: None   Collection Time: 09/23/14 12:00 PM  Result Value Ref Range   WBC 7.5 4.0 - 10.5 K/uL   RBC 4.93 4.22 - 5.81 MIL/uL   Hemoglobin 15.9 13.0 - 17.0 g/dL   HCT 45.3 39.0 - 52.0 %   MCV 91.9 78.0 - 100.0 fL   MCH 32.3 26.0 - 34.0 pg   MCHC 35.1 30.0 - 36.0 g/dL   RDW 12.8 11.5 - 15.5 %   Platelets 210 150 - 400 K/uL  I-stat troponin, ED     Status: None   Collection Time: 09/23/14 12:05 PM  Result Value Ref Range   Troponin i, poc 0.06 0.00 - 0.08 ng/mL   Comment 3            Comment: Due to the release kinetics of cTnI, a negative result within the first hours of the onset of symptoms does not rule out myocardial infarction with certainty. If myocardial infarction is still suspected, repeat the test at appropriate intervals.   Surgical pcr screen     Status: None   Collection Time: 09/23/14  4:42 PM  Result Value Ref Range   MRSA, PCR NEGATIVE NEGATIVE   Staphylococcus aureus NEGATIVE NEGATIVE    Comment:        The Xpert SA Assay (FDA approved for NASAL specimens in patients over 52 years of age), is one component of a comprehensive surveillance program.  Test performance has been validated by [facility] for patients greater than or equal to 70 year old. It is not intended to diagnose infection nor to guide or monitor treatment.   Troponin I     Status: Abnormal   Collection Time: 09/23/14  5:20 PM  Result Value Ref Range   Troponin I 0.43 (H) <0.031 ng/mL    Comment:        PERSISTENTLY INCREASED TROPONIN VALUES IN THE RANGE OF 0.04-0.49 ng/mL CAN BE SEEN IN:       -UNSTABLE ANGINA       -CONGESTIVE HEART FAILURE       -MYOCARDITIS       -CHEST TRAUMA       -ARRYHTHMIAS       -LATE PRESENTING MYOCARDIAL INFARCTION       -COPD   CLINICAL FOLLOW-UP RECOMMENDED.   Troponin I     Status: Abnormal   Collection Time: 09/23/14 10:06 PM  Result Value Ref Range   Troponin I 0.57 (HH) <0.031  ng/mL  Comment:        POSSIBLE MYOCARDIAL ISCHEMIA. SERIAL TESTING RECOMMENDED. CRITICAL RESULT CALLED TO, READ BACK BY AND VERIFIED WITH: YIMBU C,RN 09/23/14 2303 WAYK   Troponin I     Status: Abnormal   Collection Time: 09/24/14  4:43 AM  Result Value Ref Range   Troponin I 0.47 (H) <0.031 ng/mL    Comment:        PERSISTENTLY INCREASED TROPONIN VALUES IN THE RANGE OF 0.04-0.49 ng/mL CAN BE SEEN IN:       -UNSTABLE ANGINA       -CONGESTIVE HEART FAILURE       -MYOCARDITIS       -CHEST TRAUMA       -ARRYHTHMIAS       -LATE PRESENTING MYOCARDIAL INFARCTION       -COPD   CLINICAL FOLLOW-UP RECOMMENDED.   Basic metabolic panel     Status: Abnormal   Collection Time: 09/24/14  4:43 AM  Result Value Ref Range   Sodium 138 135 - 145 mmol/L   Potassium 3.5 3.5 - 5.1 mmol/L   Chloride 108 101 - 111 mmol/L   CO2 23 22 - 32 mmol/L   Glucose, Bld 103 (H) 65 - 99 mg/dL   BUN 13 6 - 20 mg/dL   Creatinine, Ser 0.83 0.61 - 1.24 mg/dL   Calcium 8.6 (L) 8.9 - 10.3 mg/dL   GFR calc non Af Amer >60 >60 mL/min   GFR calc Af Amer >60 >60 mL/min    Comment: (NOTE) The eGFR has been calculated using the CKD EPI equation. This calculation has not been validated in all clinical situations. eGFR's persistently <60 mL/min signify possible Chronic Kidney Disease.    Anion gap 7 5 - 15  CBC     Status: None   Collection Time: 09/24/14  4:43 AM  Result Value Ref Range   WBC 8.5 4.0 - 10.5 K/uL   RBC 4.29 4.22 - 5.81 MIL/uL   Hemoglobin 13.7 13.0 - 17.0 g/dL   HCT 39.8 39.0 - 52.0 %   MCV 92.8 78.0 - 100.0 fL   MCH 31.9 26.0 - 34.0 pg   MCHC 34.4 30.0 - 36.0 g/dL   RDW 12.8 11.5 - 15.5 %   Platelets 198 150 - 400 K/uL  Heparin level (unfractionated)     Status: Abnormal   Collection Time: 09/24/14  6:24 AM  Result Value Ref Range   Heparin Unfractionated 0.23 (L) 0.30 - 0.70 IU/mL    Comment:        IF HEPARIN RESULTS ARE BELOW EXPECTED VALUES, AND PATIENT DOSAGE HAS BEEN  CONFIRMED, SUGGEST FOLLOW UP TESTING OF ANTITHROMBIN III LEVELS.     Dg Chest 2 View  09/23/2014   CLINICAL DATA:  Shortness of breath and left-sided chest pain. Left arm pain.  EXAM: CHEST  2 VIEW  COMPARISON:  09/30/2013  FINDINGS: Cardiomediastinal silhouette is normal. Mediastinal contours appear intact. The aorta is torturous and contains atherosclerotic calcifications of the arch. There is stable eventration of the left hemidiaphragm. Bilateral fullness of the hilar regions is likely related to prominence of the main pulmonary arteries.  There is no evidence of focal airspace consolidation, pleural effusion or pneumothorax.  Osseous structures are without acute abnormality. Soft tissues are grossly normal.  IMPRESSION: No active cardiopulmonary disease.  Bilateral hilar fullness likely secondary to prominent main pulmonary arteries.   Electronically Signed   By: Fidela Salisbury M.D.   On: 09/23/2014 12:11  Review of Systems  Constitutional: Positive for malaise/fatigue. Negative for fever and chills.  HENT:       Tooth loss, gum disease  Eyes: Negative.   Respiratory: Positive for shortness of breath. Negative for cough and wheezing.   Cardiovascular: Positive for chest pain and leg swelling. Negative for orthopnea and claudication.  Gastrointestinal: Positive for heartburn. Negative for nausea, vomiting and abdominal pain.  Genitourinary: Negative for dysuria, urgency and frequency.  Neurological: Positive for loss of consciousness (x1 about a year ago). Negative for dizziness.  Endo/Heme/Allergies: Does not bruise/bleed easily.  All other systems reviewed and are negative.  Blood pressure 131/79, pulse 57, temperature 98.2 F (36.8 C), temperature source Oral, resp. rate 20, height _0  (1.88 m), weight 187 lb 9.8 oz (85.1 kg), SpO2 95 %. Physical Exam  Vitals reviewed. Constitutional: He is oriented to person, place, and time. He appears well-developed and well-nourished. No  distress.  HENT:  Head: Normocephalic and atraumatic.  Mouth/Throat: No oropharyngeal exudate.  Poor dentition  Eyes: Conjunctivae and EOM are normal. No scleral icterus.  Neck: Neck supple. No thyromegaly present.  Bruits v transmitted murmur B  Cardiovascular: Normal rate and regular rhythm.   Murmur (3/6 crescendo/ decrescendo murmur throughout precordium, centered on RUSB) heard. Respiratory: Effort normal and breath sounds normal. He has no wheezes. He has no rales.  GI: Soft. Bowel sounds are normal. He exhibits no distension. There is no tenderness.  Musculoskeletal: He exhibits no edema.  Lymphadenopathy:    He has no cervical adenopathy.  Neurological: He is alert and oriented to person, place, and time. No cranial nerve deficit. He exhibits normal muscle tone.  No motor deficit  Skin: Skin is warm and dry.  Psychiatric: He has a normal mood and affect.   CARDIAC CATHETERIZATION Conclusion     Severe multivessel disease:  Prox LAD to Mid LAD lesion, 95% stenosed. Ramus lesion, 95% stenosed. Ost 1st Diag lesion, 90% stenosed.  Dist RCA lesion, 90% stenosed.  1st Mrg lesion, 60% stenosed. Dist LAD lesion, 40% stenosed.  There is mild left ventricular systolic dysfunction. There does appear to be a distal LAD distribution wall motion abnormality   Severe multivessel disease involving the ostial-proximal LAD and what appears to be either a bifurcating diagonal or ramus and diagonal branch. There is also focal distal RCA disease.  Based on the location of the LAD lesion, there is not adequate landing zone for a stent prior to the Left Main. Additionally, with involvement of 2 major branches this would be a very high-risk PCI. Overall best recommendation would be to pursue CABG.  The patient also has a significant aortic valve murmur with gradient noted by catheterization. Recommend echocardiogram to better assess left ventricular outflow/aortic valve stenosis. Pending the  severity of aortic valve disease, would consider potentially Performing Aortic Valve Replacement in Addition to CABG.    Assessment/Plan: 74 yo man with classic unstable coronary syndrome who has significant CAD involving the proximal LAD, the distal RCA and 2 RI branches one of which is a large vessel. He needs CABG for revascularization for symptom relief and survival benefit.   He also has a prominent murmur c/w AS. His gradient at cath was noted to be "~20-30 mmHg." He has an echo scheduled for today. I suspect he will need an AVR as well. If so, a tissue valve would be appropriate given his age.  I discussed the prposed operation to Mr and Mrs Eidson. We discussed the general nature of  the procedure, the need for general anesthesia, the use of cardiopulmonary bypass, and the incisions to be used. I discussed the expected hospital stay, overall recovery and short and long term outcomes. I reviewed the indications, risks, benefits and alternatives. They understand the risks include, but are not limited to death, stroke, MI, DVT/PE, bleeding, possible need for transfusion, infections, cardiac arrhythmias, CHB requiring pacemaker, and other organ system dysfunction including respiratory, renal, or GI complications. He accepts the risks and agrees to proceed.  He has poor dentition and has had dental work done about a year ago for gum disease. He has not seen a dentist since then. He does not have any current pain, bleeding or loose teeth.   [name] [name] 09/24/2014, 2:09 PM

## 2014-09-29 ENCOUNTER — Encounter (HOSPITAL_COMMUNITY): Payer: Self-pay | Admitting: Thoracic Surgery (Cardiothoracic Vascular Surgery)

## 2014-09-29 ENCOUNTER — Inpatient Hospital Stay (HOSPITAL_COMMUNITY): Payer: Medicare Other

## 2014-09-29 DIAGNOSIS — Z951 Presence of aortocoronary bypass graft: Secondary | ICD-10-CM

## 2014-09-29 DIAGNOSIS — R001 Bradycardia, unspecified: Secondary | ICD-10-CM

## 2014-09-29 LAB — BASIC METABOLIC PANEL
ANION GAP: 8 (ref 5–15)
BUN: 16 mg/dL (ref 6–20)
CHLORIDE: 109 mmol/L (ref 101–111)
CO2: 19 mmol/L — ABNORMAL LOW (ref 22–32)
Calcium: 7.5 mg/dL — ABNORMAL LOW (ref 8.9–10.3)
Creatinine, Ser: 0.8 mg/dL (ref 0.61–1.24)
GFR calc non Af Amer: >60 mL/min (ref >60)
Glucose, Bld: 110 mg/dL — ABNORMAL HIGH (ref 65–99)
POTASSIUM: 4.1 mmol/L (ref 3.5–5.1)
SODIUM: 136 mmol/L (ref 135–145)

## 2014-09-29 LAB — CBC
HCT: 37 % — ABNORMAL LOW (ref 39.0–52.0)
HCT: 30.8 % — ABNORMAL LOW (ref 39.0–52.0)
HEMATOCRIT: 29.7 % — AB (ref 39.0–52.0)
HEMOGLOBIN: 10.7 g/dL — AB (ref 13.0–17.0)
Hemoglobin: 13.3 g/dL (ref 13.0–17.0)
Hemoglobin: 10 g/dL — ABNORMAL LOW (ref 13.0–17.0)
MCH: 32.7 pg (ref 26.0–34.0)
MCH: 31.8 pg (ref 26.0–34.0)
MCH: 31.5 pg (ref 26.0–34.0)
MCHC: 35.9 g/dL (ref 30.0–36.0)
MCHC: 34.7 g/dL (ref 30.0–36.0)
MCHC: 33.7 g/dL (ref 30.0–36.0)
MCV: 90.9 fL (ref 78.0–100.0)
MCV: 91.7 fL (ref 78.0–100.0)
MCV: 93.7 fL (ref 78.0–100.0)
PLATELETS: 100 10*3/uL — AB (ref 150–400)
PLATELETS: 86 10*3/uL — AB (ref 150–400)
Platelets: 97 10*3/uL — ABNORMAL LOW (ref 150–400)
RBC: 4.07 MIL/uL — ABNORMAL LOW (ref 4.22–5.81)
RBC: 3.36 MIL/uL — AB (ref 4.22–5.81)
RBC: 3.17 MIL/uL — AB (ref 4.22–5.81)
RDW: 12.6 % (ref 11.5–15.5)
RDW: 12.7 % (ref 11.5–15.5)
RDW: 13.3 % (ref 11.5–15.5)
WBC: 18.3 10*3/uL — AB (ref 4.0–10.5)
WBC: 13.5 10*3/uL — AB (ref 4.0–10.5)
WBC: 13.3 10*3/uL — AB (ref 4.0–10.5)

## 2014-09-29 LAB — GLUCOSE, CAPILLARY
GLUCOSE-CAPILLARY: 100 mg/dL — AB (ref 65–99)
GLUCOSE-CAPILLARY: 115 mg/dL — AB (ref 65–99)
GLUCOSE-CAPILLARY: 143 mg/dL — AB (ref 65–99)
GLUCOSE-CAPILLARY: 81 mg/dL (ref 65–99)
GLUCOSE-CAPILLARY: 85 mg/dL (ref 65–99)
GLUCOSE-CAPILLARY: 91 mg/dL (ref 65–99)
GLUCOSE-CAPILLARY: 94 mg/dL (ref 65–99)
Glucose-Capillary: 112 mg/dL — ABNORMAL HIGH (ref 65–99)
Glucose-Capillary: 116 mg/dL — ABNORMAL HIGH (ref 65–99)
Glucose-Capillary: 112 mg/dL — ABNORMAL HIGH (ref 65–99)
Glucose-Capillary: 103 mg/dL — ABNORMAL HIGH (ref 65–99)
Glucose-Capillary: 102 mg/dL — ABNORMAL HIGH (ref 65–99)
Glucose-Capillary: 103 mg/dL — ABNORMAL HIGH (ref 65–99)
Glucose-Capillary: 121 mg/dL — ABNORMAL HIGH (ref 65–99)
Glucose-Capillary: 158 mg/dL — ABNORMAL HIGH (ref 65–99)

## 2014-09-29 LAB — CREATININE, SERUM: Creatinine, Ser: 1.06 mg/dL (ref 0.61–1.24)

## 2014-09-29 LAB — HEMOGLOBIN A1C
Hgb A1c MFr Bld: 5.6 % (ref 4.8–5.6)
Mean Plasma Glucose: 114 mg/dL

## 2014-09-29 LAB — MAGNESIUM
MAGNESIUM: 2.5 mg/dL — AB (ref 1.7–2.4)
MAGNESIUM: 2.5 mg/dL — AB (ref 1.7–2.4)

## 2014-09-29 MED ORDER — INSULIN ASPART 100 UNIT/ML ~~LOC~~ SOLN
0.0000 [IU] | SUBCUTANEOUS | Status: DC
  Administered 2014-09-29: 2 [IU] via SUBCUTANEOUS

## 2014-09-29 MED ORDER — POTASSIUM CHLORIDE 10 MEQ/50ML IV SOLN
10.0000 meq | INTRAVENOUS | Status: AC
  Administered 2014-09-29 (×2): 10 meq via INTRAVENOUS
  Filled 2014-09-29: qty 50

## 2014-09-29 MED ORDER — ALBUMIN HUMAN 5 % IV SOLN
12.5000 g | Freq: Once | INTRAVENOUS | Status: AC
  Administered 2014-09-29: 12.5 g via INTRAVENOUS
  Filled 2014-09-29: qty 250

## 2014-09-29 MED ORDER — CHLORHEXIDINE GLUCONATE 0.12 % MT SOLN
15.0000 mL | Freq: Two times a day (BID) | OROMUCOSAL | Status: DC
  Administered 2014-09-29 – 2014-09-30 (×3): 15 mL via OROMUCOSAL
  Filled 2014-09-29 (×3): qty 15

## 2014-09-29 MED ORDER — FUROSEMIDE 10 MG/ML IJ SOLN
40.0000 mg | Freq: Once | INTRAMUSCULAR | Status: AC
  Administered 2014-09-29: 40 mg via INTRAVENOUS
  Filled 2014-09-29: qty 4

## 2014-09-29 MED ORDER — INSULIN ASPART 100 UNIT/ML ~~LOC~~ SOLN
0.0000 [IU] | Freq: Three times a day (TID) | SUBCUTANEOUS | Status: DC
  Administered 2014-09-29 – 2014-09-30 (×3): 2 [IU] via SUBCUTANEOUS

## 2014-09-29 MED ORDER — INSULIN ASPART 100 UNIT/ML ~~LOC~~ SOLN: 0.0000 [IU] | SUBCUTANEOUS | Status: DC

## 2014-09-29 MED FILL — Lidocaine HCl IV Inj 20 MG/ML: INTRAVENOUS | Qty: 5 | Status: AC

## 2014-09-29 MED FILL — Heparin Sodium (Porcine) Inj 1000 Unit/ML: INTRAMUSCULAR | Qty: 20 | Status: AC

## 2014-09-29 MED FILL — Mannitol IV Soln 20%: INTRAVENOUS | Qty: 500 | Status: AC

## 2014-09-29 MED FILL — Electrolyte-R (PH 7.4) Solution: INTRAVENOUS | Qty: 3000 | Status: AC

## 2014-09-29 MED FILL — Sodium Chloride IV Soln 0.9%: INTRAVENOUS | Qty: 3000 | Status: AC

## 2014-09-29 MED FILL — Sodium Bicarbonate IV Soln 8.4%: INTRAVENOUS | Qty: 50 | Status: AC

## 2014-09-29 NOTE — Progress Notes (Signed)
[facility] BRIEF SICU PROGRESS NOTE  1 Day Post-Op  S/P Procedure(s) (LRB): CORONARY ARTERY BYPASS GRAFTING (CABG) x4 using left internal mammory artery and right saphenous leg vein harvested endoscopically. (N/A) AORTIC VALVE REPLACEMENT (AVR) (N/A) TRANSESOPHAGEAL ECHOCARDIOGRAM (TEE) (N/A)   Stable day AV paced rhythm w/ stable BP O2 sats 95% on RA UOP 30-50 mL/hr  Plan: Continue current plan  Rexene Alberts 09/29/2014 7:57 PM

## 2014-09-29 NOTE — Care Management Note (Signed)
Case Management Note  Patient Details  Name: [name] [name] MRN: [id number] Date of Birth: 1940/05/05  Subjective/Objective:   Post op CABG x4 on 09-28-14.                   Action/Plan:   Expected Discharge Date:  09/30/14               Expected Discharge Plan:  [place]  In-House Referral:     Discharge planning Services  CM Consult  Post Acute Care Choice:    Choice offered to:     DME Arranged:    DME Agency:     HH Arranged:    HH Agency:     Status of Service:  In process, will continue to follow  Medicare Important Message Given:  Yes-second notification given Date Medicare IM Given:    Medicare IM give by:    Date Additional Medicare IM Given:    Additional Medicare Important Message give by:     If discussed at [place] of Stay Meetings, dates discussed:    Additional Comments:  Vergie Living, RN 09/29/2014, 8:26 AM

## 2014-09-29 NOTE — Progress Notes (Signed)
1 Day Post-Op Procedure(s) (LRB): CORONARY ARTERY BYPASS GRAFTING (CABG) x4 using left internal mammory artery and right saphenous leg vein harvested endoscopically. (N/A) AORTIC VALVE REPLACEMENT (AVR) (N/A) TRANSESOPHAGEAL ECHOCARDIOGRAM (TEE) (N/A) Subjective: Some incisional pain, no nausea  Objective: Vital signs in last 24 hours: Temp:  [95 F (35 C)-99.9 F (37.7 C)] 99.7 F (37.6 C) (09/08 0715) Pulse Rate:  [52-90] 86 (09/08 0715) Cardiac Rhythm:  [-] Atrial paced (09/08 0400) Resp:  [10-49] 19 (09/08 0715) BP: (87-144)/(60-90) 93/65 mmHg (09/08 0700) SpO2:  [96 %-100 %] 97 % (09/08 0715) Arterial Line BP: (90-130)/(55-89) 114/58 mmHg (09/08 0715) FiO2 (%):  [40 %-50 %] 40 % (09/07 1817) Weight:  [196 lb 3.4 oz (89 kg)] 196 lb 3.4 oz (89 kg) (09/08 0627)  Hemodynamic parameters for last 24 hours: PAP: (21-32)/(10-23) 24/13 mmHg CO:  [3.8 L/min-5.8 L/min] 4.7 L/min CI:  [1.8 L/min/m2-2.8 L/min/m2] 2.3 L/min/m2  Intake/Output from previous day: 09/07 0701 - 09/08 0700 In: 6647.4 [I.V.:3317.4; Blood:700; NG/GT:30; IV Piggyback:2600] Out: 5638 [Urine:4030; Emesis/NG output:50; Blood:1400; Chest Tube:600] Intake/Output this shift:    General appearance: alert and no distress Neurologic: intact Heart: regular rate and rhythm Lungs: diminished breath sounds bibasilar Abdomen: normal findings: soft, non-tender  Lab Results:  Recent Labs  09/28/14 2212 09/29/14 0356  WBC 14.0* 13.5*  HGB 11.6* 10.7*  HCT 33.8* 30.8*  PLT 101* 97*   BMET:  Recent Labs  09/28/14 0328  09/28/14 2159 09/28/14 2212 09/29/14 0356  NA 136  < > 138  --  136  K 3.6  < > 3.9  --  4.1  CL 103  < > 107  --  109  CO2 26  --   --   --  19*  GLUCOSE 126*  < > 128*  --  110*  BUN 19  < > 16  --  16  CREATININE 1.05  < > 0.60* 0.76 0.80  CALCIUM 8.8*  --   --   --  7.5*  < > = values in this interval not displayed.  PT/INR:  Recent Labs  09/28/14 1500  LABPROT 18.9*  INR 1.58*    ABG    Component Value Date/Time   PHART 7.358 09/28/2014 1957   HCO3 20.5 09/28/2014 1957   TCO2 20 09/28/2014 2159   ACIDBASEDEF 5.0* 09/28/2014 1957   O2SAT 94.0 09/28/2014 1957   CBG (last 3)   Recent Labs  09/29/14 0100 09/29/14 0208 09/29/14 0359  GLUCAP 81 100* 115*    Assessment/Plan: S/P Procedure(s) (LRB): CORONARY ARTERY BYPASS GRAFTING (CABG) x4 using left internal mammory artery and right saphenous leg vein harvested endoscopically. (N/A) AORTIC VALVE REPLACEMENT (AVR) (N/A) TRANSESOPHAGEAL ECHOCARDIOGRAM (TEE) (N/A) POD # 1  Doing well  CV- bradycardic/ junctional under pacer- dc metoprolol for now  Good cardiac index- dc swan  Tissue valve- no need for coumadin unless he has a fib  RESP- IS   RENAL- creatinine and lytes OK, diurese  ENDO- CBG OK off insulin drip. Transition to Q4 CBG/SSI  Anemia secondary to ABL- follow  Thrombocytopenia- mild, follow no clinical issues at this time  SCD for DVT prophylaxis, will not start enoxaparin     LOS: 6 days    [name] [name] 09/29/2014

## 2014-09-29 NOTE — Progress Notes (Signed)
    Subjective:  Denies dyspnea; chest sore   Objective:  Filed Vitals:   09/29/14 0630 09/29/14 0645 09/29/14 0700 09/29/14 0715  BP:   93/65   Pulse: 86 87 87 86  Temp: 99.7 F (37.6 C) 99.9 F (37.7 C) 99.9 F (37.7 C) 99.7 F (37.6 C)  TempSrc:      Resp: 22 49 26 19  Height:      Weight:      SpO2: 97% 97% 97% 97%    Intake/Output from previous day:  Intake/Output Summary (Last 24 hours) at 09/29/14 0845 Last data filed at 09/29/14 0700  Gross per 24 hour  Intake 6647.35 ml  Output   6080 ml  Net 567.35 ml    Physical Exam: Physical exam: Well-developed well-nourished in no acute distress.  Skin is warm and dry.  HEENT is normal.  Neck is supple.  Chest is clear to auscultation with normal expansion. S/p sternotomy Cardiovascular exam is regular rate and rhythm. 2/6 systolic murmur LSB; no diastolic murmur Abdominal exam nontender or distended. No masses palpated. Extremities show no edema. neuro grossly intact    Lab Results: Basic Metabolic Panel:  Recent Labs  09/28/14 0328  09/28/14 2159 09/28/14 2212 09/29/14 0356  NA 136  < > 138  --  136  K 3.6  < > 3.9  --  4.1  CL 103  < > 107  --  109  CO2 26  --   --   --  19*  GLUCOSE 126*  < > 128*  --  110*  BUN 19  < > 16  --  16  CREATININE 1.05  < > 0.60* 0.76 0.80  CALCIUM 8.8*  --   --   --  7.5*  MG  --   --   --  2.9* 2.5*  < > = values in this interval not displayed. CBC:  Recent Labs  09/28/14 2212 09/29/14 0356  WBC 14.0* 13.5*  HGB 11.6* 10.7*  HCT 33.8* 30.8*  MCV 91.4 91.7  PLT 101* 97*     Assessment/Plan:  1 CAD/sp CABG-continue ASA and statin 2 S/P AVR-will arrange baseline outpatient echo following DC 3 Bradycardia-underlying junctional rhythm; hold metoprolol and follow. 4 Post operative volume excess-gentle diuresis.   Kirk Ruths 09/29/2014, 8:45 AM

## 2014-09-29 NOTE — Op Note (Signed)
NAMEMarland Kitchen  [name] [name], [name] [name] NO.:  [id number]  MEDICAL RECORD NO.:  [id number]  LOCATION:  2S11C                        FACILITY:  [facility]  PHYSICIAN:  Revonda Standard. [name], M.D.DATE OF BIRTH:  1940/05/01  DATE OF PROCEDURE:  09/28/2014 DATE OF DISCHARGE:                              OPERATIVE REPORT   PREOPERATIVE DIAGNOSIS:  Severe three-vessel coronary artery disease with moderate aortic stenosis.  POSTOPERATIVE DIAGNOSIS:  Severe three-vessel coronary artery disease with moderate aortic stenosis.  PROCEDURE:   Median sternotomy, extracorporeal circulation Coronary artery bypass grafting x4   Left internal mammary artery to left anterior descending  Saphenous vein graft to posterior descending  Sequential saphenous vein graft to ramus intermedius and obtuse marginal 1 Aortic valve replacement with 25 mm [facility] Ease bovine pericardial valve (model #[id number], serial [id number]) Endoscopic vein harvest right leg.  SURGEON:  Revonda Standard. [name], M.D.  ASSISTANT:  Suzzanne Cloud, P.A.  ANESTHESIA:  General.  FINDINGS:  Transesophageal echocardiography revealed left ventricular hypertrophy with preserved systolic function.  There was moderately severe aortic stenosis with a valve area of 1.2 cm2.  Good quality coronary targets.  Saphenous vein with varicosities which required excision and end-to-end reanastomosis of [name] vein.  Mammary good quality.  Postbypass transesophageal echocardiography revealed good function of [name] prosthetic valve with no perivalvular leak.  No change in left ventricular function.  CLINICAL NOTE:  [name] [name] is a 74 year old man who presented with an unstable coronary syndrome.  He underwent cardiac catheterization where he was found to have a tight proximal LAD stenosis, a tight distal right coronary stenosis.  There was ostial stenosis of [name] ramus intermedius as well as [name] smaller branch adjacent to it and there also was  moderate disease in [name] first obtuse marginal.  He had a murmur consistent with aortic stenosis.  [name] gradient at catheterization was noted to be 20 to 30 mmHg.  [name] [name] was advised to have coronary bypass grafting.  An echocardiogram was done to better assess [name] aortic valve.  That echocardiogram showed significant impairment of motion of 2 of [name] 3 leaflets with a valve area estimated within range of mild aortic stenosis. However, after reviewing [name] films with Dr. Stanford Breed, it was thought that [name] [name] is at high risk for rapid progression of his aortic stenosis and [name] best option would be to replace [name] aortic valve at [name] time of bypass surgery.  This was discussed in detail with [name] [name].  He understood all [name] issues involved.  He accepted [name] risks and agreed to proceed.  DESCRIPTION OF PROCEDURE:  [name] [name] was brought to [name] preoperative holding area on September 28, 2014.  Anesthesia placed a Swan-Ganz catheter and an arterial blood pressure monitoring line.  He was taken to [name] operating room, anesthetized, and intubated.  A Foley catheter was placed.  Intravenous antibiotics were administered.  Transesophageal echocardiography was performed by Dr. Annye Asa.  Findings were as noted above. [name] aortic stenosis was moderately severe and appeared more significant than on [name] preoperative study.   [name] chest, abdomen, and legs were prepped and draped in [name] usual sterile fashion.  An incision was made in [name] medial aspect  of [name] right leg at [name] level of [name] knee.  [name] greater saphenous vein was identified.  It was harvested endoscopically from [name] groin to [name] upper calf.  Overall, it was a good quality vessel. Once [name] vein was removed from [name] leg, it was noted that there were 2 areas of varicosities that were not suitable for grafting.  Simultaneously with [name] vein harvest, a median sternotomy was performed.  [name] left internal mammary artery was harvested  using standard technique.  This was a good quality vessel. 5000 units of heparin was administered during [name] vessel harvest. [name] remainder of [name] full heparin dose was given prior to opening [name] pericardium.  After [name] vein had been harvested and inspected, there were 2 rather severe varicosities that were areas not suitable for use for bypass grafting.  Both of these were resected and end-to-end anastomoses of [name] vein was performed with running 7-0 Prolene sutures.  [name] remainder of [name] vein was good quality, albeit large in caliber.  After confirming adequate anticoagulation with ACT measurement, [name] aorta was cannulated via concentric 2-0 Ethibond pledgeted pursestring sutures.  A dual-stage venous cannula was placed via a pursestring suture in right atrial appendage.  Cardiopulmonary bypass was initiated.  Flows were maintained per protocol.  [name] [name] was cooled to 28 degrees Celsius.  [name] coronary arteries were inspected and anastomotic sites were chosen.  [name] conduits were inspected and cut to length.  A foam pad was placed in [name] pericardium to insulate [name] heart.  A temperature probe was placed in myocardial septum.  A left ventricular vent was placed via a pursestring suture in [name] right superior pulmonary vein.  A coronary sinus cardioplegia cannula was placed via pursestring suture in [name] right atrium.  An antegrade cardioplegia cannula was placed in [name] ascending aorta. CO2 was insufflated into [name] operative field.  There was some plaque laterally in [name] aorta on [name] right side just distal to where [name] aorta was cannulated.  This did not interfere with clamp placement.  [name] aorta was crossclamped.  [name] left ventricle was emptied via [name] vents. Cardiac arrest then was achieved with a combination of cold antegrade and retrograde blood cardioplegia.  An initial 500 mL of cardioplegia was given antegrade.  There was a rapid diastolic arrest.  An additional 500 mL of  cardioplegia was given retrograde.  There was septal cooling to 9 degrees Celsius.  Additional cardioplegia was administered at [name] completion of each graft and 20-minute intervals during [name] valve replacement.  A reversed saphenous vein graft was placed end-to-side to [name] posterior descending branch of [name] right coronary.  This was just distal to a tight stenosis.  It was a 2 mm good quality target site.  [name] vein was anastomosed end-to-side with a running 7-0 Prolene suture.  All anastomoses were probed proximally and distally at their completion to ensure patency.  Cardioplegia was administered [name] vein graft to assess flow and hemostasis.  Next, a reversed saphenous vein graft was placed sequentially to [name] ramus intermedius and OM-1.  [name] ramus intermedius was a 2 mm good quality target.  OM-1 was a 1.5 mm good quality target.  A side-to-side anastomosis was performed to [name] ramus with a running 7-0 Prolene suture and an end-to-side was performed to [name] first obtuse marginal. Cardioplegia was administered down [name] graft.  There was good flow and good hemostasis.  [name] left internal mammary artery was brought through a window in [name] pericardium.  [name] distal end was  beveled.  It was anastomosed end-to- side to [name] distal LAD.  [name] LAD had some plaque just proximal to [name] anastomosis, but a probe did pass retrograde past that spot.  [name] probe also passed distally to [name] apex.  [name] mammary was a 2 mm good quality conduit.  An end-to-side anastomosis was performed with a running 8-0 Prolene suture.  At [name] completion of [name] mammary to LAD anastomosis, [name] bulldog clamp was briefly removed to inspect for hemostasis. Rapid septal rewarming was noted.  [name] bulldog clamp was replaced and [name] mammary pedicle was tacked to [name] epicardial surface of [name] heart with 6-0 Prolene sutures.  Additional retrograde cardioplegia was administered.  An aortotomy then was performed.  [name] aortic valve  was inspected.  It was a tricuspid valve. There was heavy calcification in all 3 leaflets, but very minimal annular calcification.  [name] leaflets were relatively immobile. [name] leaflets were excised.  [name] annulus required minimal debridement. Care was taken to contain all calcific debris.  [name] annulus was copiously irrigated with iced saline.  [name] annulus sized for a 25 mm [facility] Ease pericardial tissue valve.  2-0 Ethibond horizontal mattress sutures with subannular pledgets were placed circumferentially around [name] annulus.  [name] valve was rinsed per manufacturer's recommendations.  [name] sutures were passed through [name] sewing ring of [name] valve, which was then lowered into place.  [name] valve seated nicely.  [name] sutures were sequentially tied.  [name] annulus was probed with a fine tip right-angle clamp and no gaps were noted.  There was no impingement on [name] right or left main coronary ostia.  Rewarming was begun.  [name] aortotomy then was closed in 2 layers using running 4-0 Prolene suture.  [name] first layer was a horizontal mattress suture.  At [name] completion of this layer, before tying [name] suture, [name] [name] was placed in Trendelenburg position and de-airing maneuvers were performed. After de-airing [name] heart [name] second layer of [name] aortotomy was closed with a running 4-0 Prolene suture.  [name] cardioplegia cannula was removed from [name] ascending aorta.  [name] vein grafts were cut to length.  [name] proximal vein graft anastomoses were performed to 4.5 mm punch aortotomies with running 6-0 Prolene sutures. At [name] completion of [name] final proximal anastomosis, [name] [name] was placed in Trendelenburg position.  Lidocaine was administered.  A warm dose of retrograde cardioplegia was administered.  [name] bulldog clamp was then removed from [name] left mammary artery.  Additional de-airing maneuvers were performed and [name] aortic crossclamp was removed.  [name] total crossclamp time was 122 minutes.  [name] [name]  initially fibrillated and required a single defibrillation with 10 joules.  He then was in heart block and subsequently sinus bradycardia.  While rewarming was completed, all proximal and distal anastomoses and [name] aortotomy were inspected for hemostasis.  [name] retrograde cannula and left ventricular vent were removed.  A dopamine infusion was initiated at 3 mcg/kg/minute and DDD pacing was initiated at 90 beats per minute. When [name] [name] had rewarmed to a core temperature of 37 degrees Celsius, he was weaned from cardiopulmonary bypass on [name] first attempt without difficulty.  [name] total bypass time was 172 minutes.  Postbypass TEE showed some mild septal dyskinesis due to pacing, but otherwise preserved left ventricular function.  There was no perivalvular leak and good function of [name] prosthetic valve.  A test dose of protamine was administered and was well tolerated.  [name] atrial and aortic cannulae were removed.  [name] remainder of [name] protamine  was administered without incident.  [name] chest irrigated with warm saline.  Hemostasis was achieved.  [name] pericardium was reapproximated over [name] ascending aorta and base of [name] heart with interrupted 3-0 silk sutures.  Left pleural and mediastinal tubes were placed through separate subcostal incisions.  [name] sternum was closed with a combination of single and double heavy gauge stainless steel wires.  [name] [name] tolerated [name] sternal closure well without any hemodynamic changes.  [name] pectoralis fascia, subcutaneous tissue, and skin were closed in standard fashion.  All sponge, needle, and instrument counts were correct at [name] end of [name] procedure.  [name] [name] was taken from [name] operating room to [name] Surgical Intensive Care Unit intubated and in good condition.     Revonda Standard [name], M.D.     SCH/MEDQ  D:  09/28/2014  T:  09/29/2014  Job:  [id number]

## 2014-09-29 NOTE — Care Management Note (Signed)
Case Management Note  Patient Details  Name: [name] [name] MRN: [id number] Date of Birth: July 23, 1940  Subjective/Objective:    Lives at home with spouse prior to admission.  Independent.  Plan for discharge is home with wife who will be with him 24/7                 Action/Plan:   Expected Discharge Date:  09/30/14               Expected Discharge Plan:  [place]  In-House Referral:     Discharge planning Services  CM Consult  Post Acute Care Choice:    Choice offered to:     DME Arranged:    DME Agency:     HH Arranged:    [place] Agency:     Status of Service:  In process, will continue to follow  Medicare Important Message Given:  Yes-second notification given Date Medicare IM Given:    Medicare IM give by:    Date Additional Medicare IM Given:    Additional Medicare Important Message give by:     If discussed at [place] of Stay Meetings, dates discussed:    Additional Comments:  Vergie Living, RN 09/29/2014, 11:33 AM

## 2014-09-29 NOTE — Anesthesia Postprocedure Evaluation (Signed)
  Anesthesia Post-op Note  Patient: [name] [name]  Procedure(s) Performed: Procedure(s): CORONARY ARTERY BYPASS GRAFTING (CABG) x4 using left internal mammory artery and right saphenous leg vein harvested endoscopically. (N/A) AORTIC VALVE REPLACEMENT (AVR) (N/A) TRANSESOPHAGEAL ECHOCARDIOGRAM (TEE) (N/A)  Patient Location: SICU  Anesthesia Type:General  Level of Consciousness: awake, alert , oriented, patient cooperative and responds to stimulation  Airway and Oxygen Therapy: Patient Spontanous Breathing and Patient connected to nasal cannula oxygen  Post-op Pain: mild  Post-op Assessment: Post-op Vital signs reviewed, Patient's Cardiovascular Status Stable, Respiratory Function Stable, Patent Airway, No signs of Nausea or vomiting, Adequate PO intake and Pain level controlled              Post-op Vital Signs: Reviewed and stable  Last Vitals:  Filed Vitals:   09/29/14 2100  BP: 104/62  Pulse: 78  Temp:   Resp: 27    Complications: No apparent anesthesia complications

## 2014-09-30 ENCOUNTER — Inpatient Hospital Stay (HOSPITAL_COMMUNITY): Payer: Medicare Other

## 2014-09-30 LAB — GLUCOSE, CAPILLARY
GLUCOSE-CAPILLARY: 128 mg/dL — AB (ref 65–99)
GLUCOSE-CAPILLARY: 126 mg/dL — AB (ref 65–99)
Glucose-Capillary: 132 mg/dL — ABNORMAL HIGH (ref 65–99)
Glucose-Capillary: 138 mg/dL — ABNORMAL HIGH (ref 65–99)

## 2014-09-30 LAB — CBC
HCT: 28.2 % — ABNORMAL LOW (ref 39.0–52.0)
Hemoglobin: 9.7 g/dL — ABNORMAL LOW (ref 13.0–17.0)
MCH: 32.3 pg (ref 26.0–34.0)
MCHC: 34.4 g/dL (ref 30.0–36.0)
MCV: 94 fL (ref 78.0–100.0)
PLATELETS: 75 10*3/uL — AB (ref 150–400)
RBC: 3 MIL/uL — AB (ref 4.22–5.81)
RDW: 13.4 % (ref 11.5–15.5)
WBC: 12.5 10*3/uL — ABNORMAL HIGH (ref 4.0–10.5)

## 2014-09-30 LAB — BASIC METABOLIC PANEL
Anion gap: 6 (ref 5–15)
BUN: 29 mg/dL — AB (ref 6–20)
CALCIUM: 8 mg/dL — AB (ref 8.9–10.3)
CHLORIDE: 104 mmol/L (ref 101–111)
CO2: 23 mmol/L (ref 22–32)
CREATININE: 1.02 mg/dL (ref 0.61–1.24)
GFR calc non Af Amer: >60 mL/min (ref >60)
GLUCOSE: 129 mg/dL — AB (ref 65–99)
Potassium: 4 mmol/L (ref 3.5–5.1)
Sodium: 133 mmol/L — ABNORMAL LOW (ref 135–145)

## 2014-09-30 LAB — POCT I-STAT, CHEM 8
BUN: 23 mg/dL — ABNORMAL HIGH (ref 6–20)
CHLORIDE: 104 mmol/L (ref 101–111)
Calcium, Ion: 1.13 mmol/L (ref 1.13–1.30)
Creatinine, Ser: 0.9 mg/dL (ref 0.61–1.24)
GLUCOSE: 152 mg/dL — AB (ref 65–99)
HCT: 28 % — ABNORMAL LOW (ref 39.0–52.0)
Hemoglobin: 9.5 g/dL — ABNORMAL LOW (ref 13.0–17.0)
POTASSIUM: 4.3 mmol/L (ref 3.5–5.1)
SODIUM: 136 mmol/L (ref 135–145)
TCO2: 21 mmol/L (ref 0–100)

## 2014-09-30 MED ORDER — ZOLPIDEM TARTRATE 5 MG PO TABS: 5.0000 mg | ORAL_TABLET | Freq: Every evening | ORAL | Status: DC | PRN

## 2014-09-30 MED ORDER — POTASSIUM CHLORIDE CRYS ER 20 MEQ PO TBCR
20.0000 meq | EXTENDED_RELEASE_TABLET | Freq: Two times a day (BID) | ORAL | Status: DC
  Administered 2014-09-30 – 2014-10-04 (×9): 20 meq via ORAL
  Filled 2014-09-30 (×9): qty 1

## 2014-09-30 MED ORDER — MAGNESIUM HYDROXIDE 400 MG/5ML PO SUSP: 30.0000 mL | Freq: Every day | ORAL | Status: DC | PRN

## 2014-09-30 MED ORDER — SODIUM CHLORIDE 0.9 % IJ SOLN: 3.0000 mL | INTRAMUSCULAR | Status: DC | PRN

## 2014-09-30 MED ORDER — SODIUM CHLORIDE 0.9 % IJ SOLN
3.0000 mL | Freq: Two times a day (BID) | INTRAMUSCULAR | Status: DC
  Administered 2014-09-30 – 2014-10-04 (×8): 3 mL via INTRAVENOUS

## 2014-09-30 MED ORDER — SODIUM CHLORIDE 0.9 % IV SOLN: 250.0000 mL | INTRAVENOUS | Status: DC | PRN

## 2014-09-30 MED ORDER — ALUM & MAG HYDROXIDE-SIMETH 200-200-20 MG/5ML PO SUSP: 15.0000 mL | ORAL | Status: DC | PRN

## 2014-09-30 MED ORDER — MOVING RIGHT ALONG BOOK
Freq: Once | Status: AC
  Administered 2014-09-30: 13:00:00
  Filled 2014-09-30: qty 1

## 2014-09-30 MED ORDER — GUAIFENESIN-DM 100-10 MG/5ML PO SYRP: 15.0000 mL | ORAL_SOLUTION | ORAL | Status: DC | PRN

## 2014-09-30 MED ORDER — ALPRAZOLAM 0.25 MG PO TABS: 0.2500 mg | ORAL_TABLET | Freq: Four times a day (QID) | ORAL | Status: DC | PRN

## 2014-09-30 MED ORDER — FUROSEMIDE 40 MG PO TABS
40.0000 mg | ORAL_TABLET | Freq: Every day | ORAL | Status: DC
  Administered 2014-09-30 – 2014-10-04 (×5): 40 mg via ORAL
  Filled 2014-09-30 (×5): qty 1

## 2014-09-30 MED FILL — Potassium Chloride Inj 2 mEq/ML: INTRAVENOUS | Qty: 40 | Status: AC

## 2014-09-30 MED FILL — Magnesium Sulfate Inj 50%: INTRAMUSCULAR | Qty: 10 | Status: AC

## 2014-09-30 MED FILL — Heparin Sodium (Porcine) Inj 1000 Unit/ML: INTRAMUSCULAR | Qty: 30 | Status: AC

## 2014-09-30 NOTE — Progress Notes (Signed)
CARDIAC REHAB PHASE I   PRE:  Rate/Rhythm: 73 SR  BP:  Supine:   Sitting: 103/69  Standing:    SaO2: 90-91%RA  MODE:  Ambulation: 150 ft   POST:  Rate/Rhythm: 81 SR  BP:  Supine: 123/71  Sitting:   Standing:    SaO2: 91-92%RA 1335-1357 Pt ready to go to bed. Walked 150 ft on RA with rolling walker and asst x 1 with slow steady gait. To bed with assistance. Family in room. Encouraged him to walk with staff tonight.  Pacer intact.   Graylon Good, RN BSN  09/30/2014 1:53 PM

## 2014-09-30 NOTE — Progress Notes (Signed)
2 Days Post-Op Procedure(s) (LRB): CORONARY ARTERY BYPASS GRAFTING (CABG) x4 using left internal mammory artery and right saphenous leg vein harvested endoscopically. (N/A) AORTIC VALVE REPLACEMENT (AVR) (N/A) TRANSESOPHAGEAL ECHOCARDIOGRAM (TEE) (N/A) Subjective: Up in chair Transient nausea earlier Pain well controlled  Objective: Vital signs in last 24 hours: Temp:  [98.2 F (36.8 C)-99.7 F (37.6 C)] 98.5 F (36.9 C) (09/09 0400) Pulse Rate:  [68-87] 80 (09/09 0700) Cardiac Rhythm:  [-] A-V Sequential paced (09/09 0400) Resp:  [13-28] 15 (09/09 0700) BP: (85-128)/(59-81) 110/69 mmHg (09/09 0700) SpO2:  [92 %-100 %] 97 % (09/09 0700) Arterial Line BP: (120-129)/(58-63) 128/63 mmHg (09/08 0845) Weight:  [194 lb 7.1 oz (88.2 kg)] 194 lb 7.1 oz (88.2 kg) (09/09 0630)  Hemodynamic parameters for last 24 hours: PAP: (27-30)/(13-17) 30/16 mmHg CO:  [6.8 L/min] 6.8 L/min CI:  [3.2 L/min/m2] 3.2 L/min/m2  Intake/Output from previous day: 09/08 0701 - 09/09 0700 In: 1060 [P.O.:360; I.V.:300; IV Piggyback:400] Out: 2315 [Urine:2025; Chest Tube:290] Intake/Output this shift:    General appearance: alert and no distress Neurologic: intact Heart: regular rate and rhythm Lungs: diminished breath sounds bibasilar Abdomen: normal findings: soft, non-tender  Lab Results:  Recent Labs  09/29/14 1643 09/30/14 0521  WBC 13.3* 12.5*  HGB 10.0* 9.7*  HCT 29.7* 28.2*  PLT 86* 75*   BMET:  Recent Labs  09/29/14 0356 09/29/14 1643 09/30/14 0521  NA 136  --  133*  K 4.1  --  4.0  CL 109  --  104  CO2 19*  --  23  GLUCOSE 110*  --  129*  BUN 16  --  29*  CREATININE 0.80 1.06 1.02  CALCIUM 7.5*  --  8.0*    PT/INR:  Recent Labs  09/28/14 1500  LABPROT 18.9*  INR 1.58*   ABG    Component Value Date/Time   PHART 7.358 09/28/2014 1957   HCO3 20.5 09/28/2014 1957   TCO2 20 09/28/2014 2159   ACIDBASEDEF 5.0* 09/28/2014 1957   O2SAT 94.0 09/28/2014 1957   CBG (last  3)   Recent Labs  09/29/14 1156 09/29/14 1641 09/29/14 2222  GLUCAP 121* 143* 158*    Assessment/Plan: S/P Procedure(s) (LRB): CORONARY ARTERY BYPASS GRAFTING (CABG) x4 using left internal mammory artery and right saphenous leg vein harvested endoscopically. (N/A) AORTIC VALVE REPLACEMENT (AVR) (N/A) TRANSESOPHAGEAL ECHOCARDIOGRAM (TEE) (N/A) Plan for transfer to step-down: see transfer orders   POD # 2 AVR/CABG  CV- in SR- change pacer to VVI at 56  Hold off on beta blocker another day  ASA, atorvastatin  RESP- some basilar atelectasis on CXR- IS  RENAL- creatinine and lytes OK  Still volume overloaded, diurese  ENDO- CBG well controlled- change to AC/HS  Thrombocytopenia- platelet count down again today- not on heparin/ lovenox, follow  SCD for DVT prophylaxis   LOS: 7 days    [name] 09/30/2014

## 2014-10-01 ENCOUNTER — Inpatient Hospital Stay (HOSPITAL_COMMUNITY): Payer: Medicare Other

## 2014-10-01 LAB — BASIC METABOLIC PANEL
Anion gap: 6 (ref 5–15)
BUN: 27 mg/dL — ABNORMAL HIGH (ref 6–20)
CHLORIDE: 104 mmol/L (ref 101–111)
CO2: 23 mmol/L (ref 22–32)
CREATININE: 0.83 mg/dL (ref 0.61–1.24)
Calcium: 8 mg/dL — ABNORMAL LOW (ref 8.9–10.3)
GFR calc non Af Amer: >60 mL/min (ref >60)
Glucose, Bld: 149 mg/dL — ABNORMAL HIGH (ref 65–99)
POTASSIUM: 3.8 mmol/L (ref 3.5–5.1)
SODIUM: 133 mmol/L — AB (ref 135–145)

## 2014-10-01 LAB — CBC
HCT: 27.3 % — ABNORMAL LOW (ref 39.0–52.0)
HEMOGLOBIN: 9.5 g/dL — AB (ref 13.0–17.0)
MCH: 32.5 pg (ref 26.0–34.0)
MCHC: 34.8 g/dL (ref 30.0–36.0)
MCV: 93.5 fL (ref 78.0–100.0)
Platelets: 91 10*3/uL — ABNORMAL LOW (ref 150–400)
RBC: 2.92 MIL/uL — AB (ref 4.22–5.81)
RDW: 13.4 % (ref 11.5–15.5)
WBC: 13.2 10*3/uL — AB (ref 4.0–10.5)

## 2014-10-01 LAB — GLUCOSE, CAPILLARY
GLUCOSE-CAPILLARY: 114 mg/dL — AB (ref 65–99)
GLUCOSE-CAPILLARY: 129 mg/dL — AB (ref 65–99)
GLUCOSE-CAPILLARY: 145 mg/dL — AB (ref 65–99)

## 2014-10-01 MED ORDER — METOPROLOL TARTRATE 1 MG/ML IV SOLN: 5.0000 mg | INTRAVENOUS | Status: DC | PRN

## 2014-10-01 MED ORDER — METOPROLOL TARTRATE 12.5 MG HALF TABLET
12.5000 mg | ORAL_TABLET | Freq: Two times a day (BID) | ORAL | Status: DC
  Administered 2014-10-01 (×2): 12.5 mg via ORAL
  Filled 2014-10-01 (×3): qty 1

## 2014-10-01 NOTE — Care Management Note (Addendum)
Case Management Note  Patient Details  Name: [name] [name] MRN: [id number] Date of Birth: 31-Dec-1940  Subjective/Objective:                  CAD s/p CABG S/P AVR  Action/Plan: CM spoke to pt and family at the bedside who states that pt independent at home prior to admission and anticipate pt being independent at home after. Pt's spouse at the bedside who shared that she would be able to be with the pt 24/7. Pt denies DME needs but state that they have a BSC and walker at home if needed. No further discharge needs communicated at this time and CM will monitor for additional d/c needs.  Expected Discharge Date: 10/03/14             Expected Discharge Plan:  Home/Self Care  In-House Referral:     Discharge planning Services  CM Consult  Post Acute Care Choice:    Choice offered to:     DME Arranged:    DME Agency:     HH Arranged:    HH Agency:     Status of Service:  In process, will continue to follow  Medicare Important Message Given:  Yes-third notification given Date Medicare IM Given:    Medicare IM give by:    Date Additional Medicare IM Given:    Additional Medicare Important Message give by:     If discussed at [place] of Stay Meetings, dates discussed:    Additional Comments:  Guido Sander, RN 10/01/2014, 3:28 PM

## 2014-10-01 NOTE — Progress Notes (Signed)
Patient was in and out of Afib to NSR. HR would jump from 130's Afib back to 80's NSR. Patient Asymptomatic. VSS. PA notified. Ordered to start beta blocker 12.5 metoprolol. Metoprolol given. Will continue to monitor.  Domingo Dimes RN

## 2014-10-01 NOTE — Progress Notes (Addendum)
      [address]       ,[place] [address]             [phone]      3 Days Post-Op Procedure(s) (LRB): CORONARY ARTERY BYPASS GRAFTING (CABG) x4 using left internal mammory artery and right saphenous leg vein harvested endoscopically. (N/A) AORTIC VALVE REPLACEMENT (AVR) (N/A) TRANSESOPHAGEAL ECHOCARDIOGRAM (TEE) (N/A)   Subjective:  [name] [name] has no complaints.  He states he feels pretty good.  He is ambulating without significant difficulty.  No BM  Objective: Vital signs in last 24 hours: Temp:  [98.2 F (36.8 C)-98.6 F (37 C)] 98.5 F (36.9 C) (09/10 0440) Pulse Rate:  [71-86] 82 (09/10 0440) Cardiac Rhythm:  [-] Normal sinus rhythm (09/10 0700) Resp:  [18-27] 19 (09/10 0440) BP: (112-134)/(70-83) 130/83 mmHg (09/10 0440) SpO2:  [93 %-97 %] 95 % (09/10 0440) Weight:  [194 lb 3.2 oz (88.089 kg)] 194 lb 3.2 oz (88.089 kg) (09/10 0440)  Intake/Output from previous day: 09/09 0701 - 09/10 0700 In: 800 [P.O.:720; I.V.:30; IV Piggyback:50] Out: 820 [Urine:820]  General appearance: alert, cooperative and no distress Heart: regular rate and rhythm Lungs: clear to auscultation bilaterally Abdomen: soft, non-tender; bowel sounds normal; no masses,  no organomegaly Extremities: edema trace Wound: clean and dry  Lab Results:  Recent Labs  09/30/14 0521 10/01/14 0314  WBC 12.5* 13.2*  HGB 9.7* 9.5*  HCT 28.2* 27.3*  PLT 75* 91*   BMET:  Recent Labs  09/30/14 0521 10/01/14 0314  NA 133* 133*  K 4.0 3.8  CL 104 104  CO2 23 23  GLUCOSE 129* 149*  BUN 29* 27*  CREATININE 1.02 0.83  CALCIUM 8.0* 8.0*    PT/INR:  Recent Labs  09/28/14 1500  LABPROT 18.9*  INR 1.58*   ABG    Component Value Date/Time   PHART 7.358 09/28/2014 1957   HCO3 20.5 09/28/2014 1957   TCO2 21 09/29/2014 1643   ACIDBASEDEF 5.0* 09/28/2014 1957   O2SAT 94.0 09/28/2014 1957   CBG (last 3)   Recent Labs  09/30/14 2120 10/01/14 0603 10/01/14 0618    GLUCAP 126* 129* 114*    Assessment/Plan: S/P Procedure(s) (LRB): CORONARY ARTERY BYPASS GRAFTING (CABG) x4 using left internal mammory artery and right saphenous leg vein harvested endoscopically. (N/A) AORTIC VALVE REPLACEMENT (AVR) (N/A) TRANSESOPHAGEAL ECHOCARDIOGRAM (TEE) (N/A)  1. CV- maintaining NSR rate in the 80-90s- will turn pacer off back up- will start low dose Beta Blocker tomorrow if maintains rhythm today 2. Pulm- no acute issues, continue IS, CXR with small effusion on left 3. Renal-creatinine WNL, remains hypervolemic on Lasix 40 mg daily 4. GI- LOC constipation, will add Lactulose prn 5. CBGs controlled- will d/c SSIP 6. Dispo- patient stable, turn pacer off back up, continue diuretic, lactulose prn for constipation   LOS: 8 days    BARRETT, ERIN 10/01/2014   Chart reviewed, patient examined, agree with above.

## 2014-10-01 NOTE — Care Management Important Message (Signed)
Important Message  Patient Details  Name: [name] [name] MRN: [id number] Date of Birth: Mar 07, 1940   Medicare Important Message Given:  Yes-third notification given    Guido Sander, RN 10/01/2014, 3:28 PM

## 2014-10-01 NOTE — Progress Notes (Signed)
Patient just walked independently with wife, instructed to walk with family or nursing staff 3 times per day while in the hospital.  Education completed re: sternal precautions, signs and symptoms of infection, activity and exercise progression, nutrition tips, and referral to phase II cardiac rehab.  Wife was present for education as well.   Progressing well.   [phone]

## 2014-10-02 DIAGNOSIS — I471 Supraventricular tachycardia: Secondary | ICD-10-CM

## 2014-10-02 MED ORDER — METOPROLOL TARTRATE 25 MG PO TABS
25.0000 mg | ORAL_TABLET | Freq: Two times a day (BID) | ORAL | Status: DC
  Administered 2014-10-02 (×2): 25 mg via ORAL
  Filled 2014-10-02 (×4): qty 1

## 2014-10-02 NOTE — Discharge Summary (Addendum)
Physician Discharge Summary  Patient ID: [name] [name] MRN: [id number] DOB/AGE: 1940/09/17 74 y.o.  Admit date: 09/23/2014 Discharge date: 10/04/2014  Admission Diagnoses:  Patient Active Problem List   Diagnosis Date Noted  . Bradycardia 09/29/2014  . Unstable angina pectoris 09/23/2014  . Cardiac murmur, previously undiagnosed 09/23/2014  . 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 Unstable angina 09/23/2014  . Syncope and collapse 09/30/2013  . Syncope 09/30/2013  . Essential hypertension, benign 09/30/2013  . GERD (gastroesophageal reflux disease) 09/30/2013    Discharge Diagnoses:   Patient Active Problem List   Diagnosis Date Noted  . Bradycardia 09/29/2014  . S/P CABG (coronary artery bypass graft)   . S/P CABG x 4 09/28/2014  . Unstable angina pectoris 09/23/2014  . Cardiac murmur, previously undiagnosed 09/23/2014  . 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 Unstable angina 09/23/2014  . Syncope and collapse 09/30/2013  . Syncope 09/30/2013  . Essential hypertension, benign 09/30/2013  . GERD (gastroesophageal reflux disease) 09/30/2013   Discharged Condition: good  History of Present Illness:  [name] [name] is a 74 yo white male with known history of HTN, Hyperlipidemia, H/O heart murmur with syncopal episode x 1 yr.  He developed chest pressure and pain in his left upper arm.  This had been occurring with exertion for the past week.  His symptoms were relieved with rest after a few minutes.  There was associated shortness of breath.  His symptoms have become more severe and on presentation he had a severe episode of after heavy lifting.  This episode resolved spontaneously but the patient's wife thought he should come to the ED.  On arrival the patient was chest pain free.  His initial Troponin level was negative, but bumped up to 0.57.  EKG did not have any significant  EKG changes.  It was felt the patient should be admitted for further care.  He underwent cardiac catheterization which showed multivessel CAD.  Echocardiogram was also done and confirmed the presence of Aortic Stenosis.  It was felt he would require surgical intervention for his coronary disease and Aortic Valve disease.  Dental consult was obtained and the patient was cleared by Dr. Enrique Sack.  [name] evaluated the patient and was in agreement he would require CABG and AVR.  The risks and benefits of the procedure were explained to the patient and he was agreeable to proceed.  Hospital Course:   He was taken to the operating room on 09/28/2014.  He underwent CABG x 4 utilizing LIMA to LAD, SVG to  PDA, and a sequential SVG to ramus intermediate and OM1.  He also underwent AVR utilizing a 25 mm [facility] Ease bovine pericardial tissue valve.  He tolerated the procedure and was taken to the SICU in stable condition.  The patient was extubated the evening of surgery.  During his stay in the SICU the patient had bradycardia and junctional rhythm.  He required temporary back up pacing.  He later converted to NSR.  He developed some thrombocytopenia and was not placed on Lovenox or Heparin for DVT prophylaxis.  These have been slowly trending up and his latest value 91K.  He was felt medically stable for transfer to the step down unit on POD #2.  The patient has been weaned off back up pacemaker.  However, he developed PAT vs A. Flutter.  He was started on a Beta Blocker which helped with this.  We will titrate the dose as tolerated.  He is ambulating independently.  His pacing wires have been removed without difficulty.  He is felt medically stable for discharge today 10/04/2014.   Significant Diagnostic Studies: cardiac graphics:   Echocardiogram: - Left ventricle: The cavity size was normal. Wall thickness was increased in a pattern of moderate LVH. Systolic function was normal. The estimated  ejection fraction was in the range of 55% to 60%. Doppler parameters are consistent with abnormal left ventricular relaxation (grade 1 diastolic dysfunction). - Aortic valve: AV is thickened, calcified with restricted motion Peak and mean gradients through the valve are 27 and 18 mm Hg respectively consistent with mild aortic stenosis. 2D images suggest that AS should be at least moderate. - Mitral valve: There was mild regurgitation. - Left atrium: The atrium was mildly dilated.   Angiography:    Severe multivessel disease:  Prox LAD to Mid LAD lesion, 95% stenosed. Ramus lesion, 95% stenosed. Ost 1st Diag lesion, 90% stenosed.  Dist RCA lesion, 90% stenosed.  1st Mrg lesion, 60% stenosed. Dist LAD lesion, 40% stenosed.  There is mild left ventricular systolic dysfunction. There does appear to be a distal LAD distribution wall motion abnormality  Treatments: surgery:   Median sternotomy, extracorporeal circulation, coronary artery bypass grafting x4 (left internal mammary artery to left anterior descending, saphenous vein graft to posterior descending, sequential saphenous vein graft to ramus intermedius and obtuse marginal 1), aortic valve replacement with 25 mm [facility] Ease bovine pericardial valve (model #[id number], serial [id number]), endoscopic vein harvest, right leg.  Disposition: 01-Home or Self Care   Discharge Medications:  The patient has been discharged on:   1.Beta Blocker:  Yes [ x  ]                              No   [   ]                              If No, reason:  2.Ace Inhibitor/ARB: Yes [x   ]                                     No  [    ]                                     If No, reason:  3.Statin:   Yes [ x  ]                  No  [   ]                  If No, reason:  4.Ecasa:  Yes  [ x  ]                  No   [   ]                  If No, reason:    Medication List    STOP taking these medications        amLODipine 10 MG  tablet  Commonly known as:  NORVASC      TAKE these medications  acyclovir 800 MG tablet  Commonly known as:  ZOVIRAX  Take 400 mg by mouth 2 (two) times daily.     aspirin 325 MG EC tablet  Take 1 tablet (325 mg total) by mouth daily.     furosemide 40 MG tablet  Commonly known as:  LASIX  Take 1 tablet (40 mg total) by mouth daily. For 1 week     lansoprazole 30 MG capsule  Commonly known as:  PREVACID  Take 30 mg by mouth daily at 12 noon.     lisinopril 5 MG tablet  Commonly known as:  PRINIVIL,ZESTRIL  Take 1 tablet (5 mg total) by mouth daily.     meloxicam 15 MG tablet  Commonly known as:  MOBIC  Take 15 mg by mouth daily.     metoprolol 50 MG tablet  Commonly known as:  LOPRESSOR  Take 1 tablet (50 mg total) by mouth 2 (two) times daily.     multivitamin with minerals tablet  Take 1 tablet by mouth daily.     oxybutynin 5 MG 24 hr tablet  Commonly known as:  DITROPAN-XL  Take 5 mg by mouth at bedtime.     oxyCODONE 5 MG immediate release tablet  Commonly known as:  Oxy IR/ROXICODONE  Take 1-2 tablets (5-10 mg total) by mouth every 3 (three) hours as needed for severe pain.     potassium chloride SA 20 MEQ tablet  Commonly known as:  K-DUR,KLOR-CON  Take 1 tablet (20 mEq total) by mouth 2 (two) times daily. For 7 days     prednisoLONE acetate 1 % ophthalmic suspension  Commonly known as:  PRED FORTE  Place 1 drop into the left eye daily.     sildenafil 20 MG tablet  Commonly known as:  REVATIO  Take 20 mg by mouth daily as needed.     simvastatin 40 MG tablet  Commonly known as:  ZOCOR  Take 20 mg by mouth daily.     timolol 0.5 % ophthalmic solution  Commonly known as:  BETIMOL  Place 1 drop into the left eye daily.     traMADol 50 MG tablet  Commonly known as:  ULTRAM  Take 1-2 tablets (50-100 mg total) by mouth every 4 (four) hours as needed for moderate pain.        Discharge Instructions    Amb Referral to Cardiac  Rehabilitation    Complete by:  As directed   Congestive Heart Failure: If diagnosis is Heart Failure, patient MUST meet each of the CMS criteria: 1. Left Ventricular Ejection Fraction </= 35% 2. NYHA class II-IV symptoms despite being on optimal heart failure therapy for at least 6 weeks. 3. Stable = have not had a recent (<6 weeks) or planned (<6 months) major cardiovascular hospitalization or procedure  Program Details: - Physician supervised classes - 1-3 classes per week over a 12-18 week period, generally for a total of 36 sessions  Physician Certification: I certify that the above Cardiac Rehabilitation treatment is medically necessary and is medically approved by me for treatment of this patient. The patient is willing and cooperative, able to ambulate and medically stable to participate in exercise rehabilitation. The participant's progress and Individualized Treatment Plan will be reviewed by the Medical Director, Cardiac Rehab staff and as indicated by the Referring/Ordering Physician.  Diagnosis:  CABG          Follow-up Information    Follow up with [name], MD On 10/25/2014.  Specialty:  Cardiothoracic Surgery   Why:  Appointment is at 2:00   Contact information:   [address] [place] [address] [phone]       Follow up with [facility] On 10/25/2014.   Why:  please get CXR 30 min prior to your appointment with [name], located on first floor of [facility]   Contact information:   [facility]       Follow up with Erlene Quan, PA-C On 10/18/2014.   Specialties:  Cardiology, Radiology   Why:  @8 :30   Contact information:   [address] [place] [address] [phone]       Follow up with [facility]. Go on 11/01/2014.   Specialty:  Cardiology   Why:  @11 :30 for echocardiogram   Contact information:   [address] [place] [phone]      Signed: Ellwood Handler 10/04/2014, 12:18 PM

## 2014-10-02 NOTE — Discharge Instructions (Signed)
Aortic Valve Replacement, Care After °Refer to this sheet in the next few weeks. These instructions provide you with information on caring for yourself after your procedure. Your health care provider may also give you specific instructions. Your treatment has been planned according to current medical practices, but problems sometimes occur. Call your health care provider if you have any problems or questions after your procedure. °HOME CARE INSTRUCTIONS  °· Take medicines only as directed by your health care provider. °· If your health care provider has prescribed elastic stockings, wear them as directed. °· Take frequent naps or rest often throughout the day. °· Avoid lifting over 10 lbs (4.5 kg) or pushing or pulling things with your arms for 6-8 weeks or as directed by your health care provider. °· Avoid driving or airplane travel for 4-6 weeks after surgery or as directed by your health care provider. If you are riding in a car for an extended period, stop every 1-2 hours to stretch your legs. Keep a record of your medicines and medical history with you when traveling. °· Do not drive or operate heavy machinery while taking pain medicine. (narcotics). °· Do not cross your legs. °· Do not use any tobacco products including cigarettes, chewing tobacco, or electronic cigarettes. If you need help quitting, ask your health care provider. °· Do not take baths, swim, or use a hot tub until your health care provider approves. Take showers once your health care provider approves. Pat incisions dry. Do not rub incisions with a washcloth or towel. °· Avoid climbing stairs and using the handrail to pull yourself up for the first 2-3 weeks after surgery. °· Return to work as directed by your health care provider. °· Drink enough fluid to keep your urine clear or pale yellow. °· Do not strain to have a bowel movement. Eat high-fiber foods if you become constipated. You may also take a medicine to help you have a bowel  movement (laxative) as directed by your health care provider. °· Resume sexual activity as directed by your health care provider. Men should not use medicines for erectile dysfunction until their doctor says it is okay. °· If you had a certain type of heart condition in the past, you may need to take antibiotic medicine before having dental work or surgery. Let your dentist and health care providers know if you had one or more of the following: °· Previous endocarditis. °· An artificial (prosthetic) heart valve. °· Congenital heart disease. °SEEK MEDICAL CARE IF: °· You develop a skin rash.   °· You experience sudden changes in your weight. °· You have a fever. °SEEK IMMEDIATE MEDICAL CARE IF:  °· You develop chest pain that is not coming from your incision. °· You have drainage (pus), redness, swelling, or pain at your incision site.   °· You develop shortness of breath or have difficulty breathing.   °· You have increased bleeding from your incision site.   °· You develop light-headedness.   °MAKE SURE YOU:  °· Understand these directions. °· Will watch your condition. °· Will get help right away if you are not doing well or get worse. °Document Released: 07/26/2004 Document Revised: 05/24/2013 Document Reviewed: 10/22/2011 °[employer]® Patient Information ©2015 [employer]. This information is not intended to replace advice given to you by your health care provider. Make sure you discuss any questions you have with your health care provider. ° °Endoscopic Saphenous Vein Harvesting °Care After °Refer to this sheet in the next few weeks. These instructions provide you with information   on caring for yourself after your procedure. Your health care provider may also give you more specific instructions. Your treatment has been planned according to current medical practices, but problems sometimes occur. Call your health care provider if you have any problems or questions after your procedure. °HOME CARE  INSTRUCTIONS °Medicine °· Take whatever pain medicine your surgeon prescribes. Follow the directions carefully. Do not take over-the-counter pain medicine unless your surgeon says it is okay. Some pain medicine can cause bleeding problems for several weeks after surgery. °· Follow your surgeon's instructions about driving. You will probably not be permitted to drive after heart surgery. °· Take any medicines your surgeon prescribes. Any medicines you took before your heart surgery should be checked with your health care provider before you start taking them again. °Wound care °· If your surgeon has prescribed an elastic bandage or stocking, ask how long you should wear it. °· Check the area around your surgical cuts (incisions) whenever your bandages (dressings) are changed. Look for any redness or swelling. °· You will need to return to have the stitches (sutures) or staples taken out. Ask your surgeon when to do that. °· Ask your surgeon when you can shower or bathe. °Activity °· Try to keep your legs raised when you are sitting. °· Do any exercises your health care providers have given you. These may include deep breathing exercises, coughing, walking, or other exercises. °SEEK MEDICAL CARE IF: °· You have any questions about your medicines. °· You have more leg pain, especially if your pain medicine stops working. °· New or growing bruises develop on your leg. °· Your leg swells, feels tight, or becomes red. °· You have numbness in your leg. °SEEK IMMEDIATE MEDICAL CARE IF: °· Your pain gets much worse. °· Blood or fluid leaks from any of the incisions. °· Your incisions become warm, swollen, or red. °· You have chest pain. °· You have trouble breathing. °· You have a fever. °· You have more pain near your leg incision. °MAKE SURE YOU: °· Understand these instructions. °· Will watch your condition. °· Will get help right away if you are not doing well or get worse. °Document Released: 09/19/2010 Document  Revised: 01/12/2013 Document Reviewed: 09/19/2010 °ExitCare® Patient Information ©2015 [employer]. This information is not intended to replace advice given to you by your health care provider. Make sure you discuss any questions you have with your health care provider. ° °

## 2014-10-02 NOTE — Progress Notes (Addendum)
      [address]       [place] [address]             [phone]      4 Days Post-Op Procedure(s) (LRB): CORONARY ARTERY BYPASS GRAFTING (CABG) x4 using left internal mammory artery and right saphenous leg vein harvested endoscopically. (N/A) AORTIC VALVE REPLACEMENT (AVR) (N/A) TRANSESOPHAGEAL ECHOCARDIOGRAM (TEE) (N/A)   Subjective:  [name] [name] has no complaints this morning.  He was hoping to be discharged today.  Objective: Vital signs in last 24 hours: Temp:  [98.4 F (36.9 C)-99.8 F (37.7 C)] 98.7 F (37.1 C) (09/11 0434) Pulse Rate:  [83-101] 83 (09/11 0434) Cardiac Rhythm:  [-] Normal sinus rhythm (09/11 0700) Resp:  [18-19] 18 (09/11 0434) BP: (127-130)/(78-84) 128/78 mmHg (09/11 0434) SpO2:  [94 %-95 %] 95 % (09/11 0434) Weight:  [191 lb 5.8 oz (86.8 kg)] 191 lb 5.8 oz (86.8 kg) (09/11 0434)  Intake/Output from previous day: 09/10 0701 - 09/11 0700 In: 360 [P.O.:360] Out: 1325 [Urine:1325]  General appearance: alert, cooperative and no distress Heart: regular rate and rhythm Lungs: clear to auscultation bilaterally Abdomen: soft, non-tender; bowel sounds normal; no masses,  no organomegaly Extremities: edema 1+ pitting Wound: clean and dry  Lab Results:  Recent Labs  09/30/14 0521 10/01/14 0314  WBC 12.5* 13.2*  HGB 9.7* 9.5*  HCT 28.2* 27.3*  PLT 75* 91*   BMET:  Recent Labs  09/30/14 0521 10/01/14 0314  NA 133* 133*  K 4.0 3.8  CL 104 104  CO2 23 23  GLUCOSE 129* 149*  BUN 29* 27*  CREATININE 1.02 0.83  CALCIUM 8.0* 8.0*    PT/INR: No results for input(s): LABPROT, INR in the last 72 hours. ABG    Component Value Date/Time   PHART 7.358 09/28/2014 1957   HCO3 20.5 09/28/2014 1957   TCO2 21 09/29/2014 1643   ACIDBASEDEF 5.0* 09/28/2014 1957   O2SAT 94.0 09/28/2014 1957   CBG (last 3)   Recent Labs  10/01/14 0603 10/01/14 0618 10/01/14 2122  GLUCAP 129* 114* 145*    Assessment/Plan: S/P Procedure(s)  (LRB): CORONARY ARTERY BYPASS GRAFTING (CABG) x4 using left internal mammory artery and right saphenous leg vein harvested endoscopically. (N/A) AORTIC VALVE REPLACEMENT (AVR) (N/A) TRANSESOPHAGEAL ECHOCARDIOGRAM (TEE) (N/A)  1. CV- Short bursts of A. Fib, currently NSR on exam- will increase Lopressor to 25 mg BID- will discuss further management with Dr. Laverta Baltimore, possibly start oral Amiodarone, ?need for anticoagulation 2. Pulm- no acute issues, continue IS 3. Renal- creatinine WNL, + hypervolemia- continue Lasix, will recheck BMET, electrolytes 4. GI- constipation resolved 5. Dispo- patient with short bursts of A.Fib, Cardiology to see, will increase Beta Blocker, may need Amiodarone/Anticoagulation   LOS: 9 days    [name] [name] 10/02/2014   Chart reviewed, patient examined, agree with above. He was seen by Dr. Marlou Porch and rec is to continue beta blocker without amio or coumadin. I agree that his strips mostly look like PAC's or short bursts of PAT. Lopressor increased.

## 2014-10-02 NOTE — Progress Notes (Signed)
Patient Name: [name] [name] Date of Encounter: 10/02/2014     Principal Problem:   Unstable angina pectoris Active Problems:   Cardiac murmur, previously undiagnosed   Coronary artery disease involving native heart with unstable angina pectoris   Unstable angina   S/P CABG x 4   Bradycardia   S/P CABG (coronary artery bypass graft)    SUBJECTIVE  No complaints. Seen up walking around. Ready to go home.   CURRENT MEDS . acetaminophen  1,000 mg Oral 4 times per day   Or  . acetaminophen (TYLENOL) oral liquid 160 mg/5 mL  1,000 mg Per Tube 4 times per day  . acyclovir  400 mg Oral BID  . aspirin EC  325 mg Oral Daily   Or  . aspirin  324 mg Per Tube Daily  . atorvastatin  80 mg Oral q1800  . bisacodyl  10 mg Oral Daily   Or  . bisacodyl  10 mg Rectal Daily  . docusate sodium  200 mg Oral Daily  . furosemide  40 mg Oral Daily  . metoprolol tartrate  25 mg Oral BID  . oxybutynin  5 mg Oral QHS  . pantoprazole  40 mg Oral Daily  . potassium chloride  20 mEq Oral BID  . prednisoLONE acetate  1 drop Left Eye Daily  . sodium chloride  3 mL Intravenous Q12H  . timolol  1 drop Left Eye Daily    OBJECTIVE  Filed Vitals:   10/01/14 1406 10/01/14 1545 10/01/14 2018 10/02/14 0434  BP:  130/84 127/80 128/78  Pulse: 88 101 91 83  Temp: 98.4 F (36.9 C)  99.8 F (37.7 C) 98.7 F (37.1 C)  TempSrc: Oral  Oral Oral  Resp: 19  18 18   Height:      Weight:    191 lb 5.8 oz (86.8 kg)  SpO2: 94%  95% 95%    Intake/Output Summary (Last 24 hours) at 10/02/14 0838 Last data filed at 10/02/14 0437  Gross per 24 hour  Intake    360 ml  Output   1325 ml  Net   -965 ml   Filed Weights   09/30/14 0630 10/01/14 0440 10/02/14 0434  Weight: 194 lb 7.1 oz (88.2 kg) 194 lb 3.2 oz (88.089 kg) 191 lb 5.8 oz (86.8 kg)    PHYSICAL EXAM  General: Pleasant, NAD. Neuro: Alert and oriented X 3. Moves all extremities spontaneously. Psych: Normal affect. HEENT:  Normal  Neck:  Supple without bruits or JVD. Lungs:  Resp regular and unlabored, CTA. Heart: RRR no s3, s4, or murmurs. Abdomen: Soft, non-tender, non-distended, BS + x 4.  Extremities: No clubbing, cyanosis or edema. DP/PT/Radials 2+ and equal bilaterally.  Accessory Clinical Findings  CBC  Recent Labs  09/30/14 0521 10/01/14 0314  WBC 12.5* 13.2*  HGB 9.7* 9.5*  HCT 28.2* 27.3*  MCV 94.0 93.5  PLT 75* 91*   Basic Metabolic Panel  Recent Labs  09/29/14 1643 09/30/14 0521 10/01/14 0314  NA 136 133* 133*  K 4.3 4.0 3.8  CL 104 104 104  CO2  --  23 23  GLUCOSE 152* 129* 149*  BUN 23* 29* 27*  CREATININE 0.90  1.06 1.02 0.83  CALCIUM  --  8.0* 8.0*  MG 2.5*  --   --     TELE  NSR with short bursts of PAT vs Aflutter.   Radiology/Studies  Dg Orthopantogram  09/25/2014   CLINICAL DATA:  Dental disease  EXAM:  ORTHOPANTOGRAM/PANORAMIC  COMPARISON:  None.  FINDINGS: Patient has 9 upper teeth centrally without evidence of abscess. Patient has eighth teeth in the lower mandible centrally. One LEFT lower molar. No periapical abscess.  IMPRESSION: Central upper and lower teeth without evidence of abscess.   Electronically Signed   By: Suzy Bouchard M.D.   On: 09/25/2014 16:52   Dg Chest 2 View  10/01/2014   CLINICAL DATA:  Status post CABG.  EXAM: CHEST  2 VIEW  COMPARISON:  09/30/2014.  FINDINGS: Improved platelike atelectasis at the LEFT base. Small LEFT effusion. Slight vascular congestion. Cardiomegaly. Prior CABG. Swan-Ganz introducer removed. No pneumothorax.  IMPRESSION: Slight improvement aeration.   Electronically Signed   By: Staci Righter M.D.   On: 10/01/2014 11:05   Dg Chest 2 View  09/23/2014   CLINICAL DATA:  Shortness of breath and left-sided chest pain. Left arm pain.  EXAM: CHEST  2 VIEW  COMPARISON:  09/30/2013  FINDINGS: Cardiomediastinal silhouette is normal. Mediastinal contours appear intact. The aorta is torturous and contains atherosclerotic calcifications of the  arch. There is stable eventration of the left hemidiaphragm. Bilateral fullness of the hilar regions is likely related to prominence of the main pulmonary arteries.  There is no evidence of focal airspace consolidation, pleural effusion or pneumothorax.  Osseous structures are without acute abnormality. Soft tissues are grossly normal.  IMPRESSION: No active cardiopulmonary disease.  Bilateral hilar fullness likely secondary to prominent main pulmonary arteries.   Electronically Signed   By: Fidela Salisbury M.D.   On: 09/23/2014 12:11   Dg Chest Port 1 View  09/30/2014   CLINICAL DATA:  CABG.  EXAM: PORTABLE CHEST - 1 VIEW  COMPARISON:  09/29/2014.  FINDINGS: Right IJ sheath patch that interim removal of Swan-Ganz catheter, left chest tube, mediastinal drainage catheter. Right IJ sheath in stable position. Prior CABG. Prior cardiac valve replacement. Stable cardiomegaly. Very mild increase in interstitial prominence, very mild CHF cannot be completely excluded. Low lung volumes with bibasilar atelectasis. Small left pleural effusion. No pneumothorax.  IMPRESSION: 1. Interim removal of Swan-Ganz catheter, left chest tube, mediastinal drainage catheter. Right IJ sheath in stable position. 2. Prior CABG. Prior cardiac valve replacement. Stable cardiomegaly. Very mild increase in interstitial prominence. Very mild congestive heart failure cannot be completely excluded. 3. Low lung volumes with bibasilar atelectasis. Small left pleural effusion.   Electronically Signed   By: Marcello Moores  Register   On: 09/30/2014 07:53   Dg Chest Port 1 View  09/29/2014   CLINICAL DATA:  Status post CABG and aortic valve replacement.  EXAM: PORTABLE CHEST - 1 VIEW  COMPARISON:  09/28/2014  FINDINGS: There has been interval extubation. Median sternotomy wires and postsurgical changes from CABG and aortic valve replacement are stable. Swan-Ganz catheter, left chest tube and mediastinal drain are stable.  Cardiomediastinal silhouette is  normal. Mediastinal contours appear intact.  There is no evidence of pneumothorax. There are probably bilateral small pleural effusions. The left hemidiaphragm is obscured, which may be seen with left pleural effusion and associated atelectasis, or left lower lobe consolidation.  Osseous structures are without acute abnormality. Soft tissues are grossly normal.  IMPRESSION: Status postextubation.  Low lung volumes with probable bilateral pleural effusions.  Left lower lobe atelectasis versus airspace consolidation.   Electronically Signed   By: Fidela Salisbury M.D.   On: 09/29/2014 07:57   Dg Chest Port 1 View  09/28/2014   CLINICAL DATA:  Follow-up CABG with aortic valve replacement.  EXAM: PORTABLE  CHEST - 1 VIEW  COMPARISON:  09/23/2014  FINDINGS: Interval median sternotomy with CABG and aortic valve replacement. Endotracheal tube has its tip 2.5 cm above the carina. Nasogastric tube enters the abdomen. Swan-Ganz catheter placed from a right internal jugular approach has its tip in the main pulmonary artery. Left chest tubes in place. Mediastinal drain is in place. No pneumothorax. There are changes of volume loss in both lower lobes, left worse than right.  IMPRESSION: Lines and tubes well position. Lower lobe volume loss left worse than right.   Electronically Signed   By: Nelson Chimes M.D.   On: 09/28/2014 16:05    ASSESSMENT AND PLAN  BABY STAIRS is a 74 y.o. male with a history of HTN, HLD, prior tobacco abuse and no prior cardiac disease who presented to [facility] on 09/23/14 with exertional chest pain.   1. CAD -- s/p LHC on 09/23/14 with severe multivessel CAD and AS and referred for CABG and AVR. -- s/p CABG x4V and AVR on 09/29/14 -- Continue ASA, statin and BB.   2. Mod AS- S/P AVR with tissue valve-will need baseline outpatient echo following DC  3. Question Post op afib- I am not convinced this is atrial fibrillation. He has short bursts of PAT vs. Aflutter. These bursts are very short,  intermittent and asymptomatic. It has improved after BB was increased last night to 25mg  BID.  -- Cardiology asked for input on Amiodarone/Anticoagulation. Would hold off on both of these for now. Will hopefully improved on increased BB and recovery from surgery.  4. Post of CHF- 2D ECHO with normal LVEF and G1DD. -- He now appears euvolemic after diruesis. Continue lasix 40mg  po qd.  Judy Pimple PA-C  Pager [phone]   Personally seen and examined. Agree with above. Likely short burst of PAT. Seemed to be improved with beta blocker. Continue metoprolol. No sustained episodes of what appears to be AFIB. If this does occur, will need to consider anticoaulation. No amiodarone necessary if Bb is helping.   Candee Furbish, MD

## 2014-10-03 LAB — GLUCOSE, CAPILLARY
GLUCOSE-CAPILLARY: 118 mg/dL — AB (ref 65–99)
GLUCOSE-CAPILLARY: 120 mg/dL — AB (ref 65–99)

## 2014-10-03 LAB — BASIC METABOLIC PANEL
Anion gap: 8 (ref 5–15)
BUN: 18 mg/dL (ref 6–20)
CALCIUM: 8.6 mg/dL — AB (ref 8.9–10.3)
CHLORIDE: 103 mmol/L (ref 101–111)
CO2: 25 mmol/L (ref 22–32)
CREATININE: 0.87 mg/dL (ref 0.61–1.24)
Glucose, Bld: 125 mg/dL — ABNORMAL HIGH (ref 65–99)
Potassium: 4.2 mmol/L (ref 3.5–5.1)
SODIUM: 136 mmol/L (ref 135–145)

## 2014-10-03 LAB — MAGNESIUM: MAGNESIUM: 2.1 mg/dL (ref 1.7–2.4)

## 2014-10-03 MED ORDER — METOPROLOL TARTRATE 50 MG PO TABS
50.0000 mg | ORAL_TABLET | Freq: Two times a day (BID) | ORAL | Status: DC
  Administered 2014-10-03 – 2014-10-04 (×3): 50 mg via ORAL
  Filled 2014-10-03 (×3): qty 1

## 2014-10-03 NOTE — Progress Notes (Signed)
Patient Name: [name] [name] Date of Encounter: 10/03/2014  Principal Problem:   Unstable angina pectoris Active Problems:   Cardiac murmur, previously undiagnosed   Coronary artery disease involving native heart with unstable angina pectoris   Unstable angina   S/P CABG x 4   Bradycardia   S/P CABG (coronary artery bypass graft)  SUBJECTIVE  Ambulated without difficulty. Feeling well. No chest pain, sob or palpitations. EPW pulled per protocol today. Wants to go home.  CURRENT MEDS . acetaminophen  1,000 mg Oral 4 times per day   Or  . acetaminophen (TYLENOL) oral liquid 160 mg/5 mL  1,000 mg Per Tube 4 times per day  . acyclovir  400 mg Oral BID  . aspirin EC  325 mg Oral Daily   Or  . aspirin  324 mg Per Tube Daily  . atorvastatin  80 mg Oral q1800  . bisacodyl  10 mg Oral Daily   Or  . bisacodyl  10 mg Rectal Daily  . docusate sodium  200 mg Oral Daily  . furosemide  40 mg Oral Daily  . metoprolol tartrate  50 mg Oral BID  . oxybutynin  5 mg Oral QHS  . pantoprazole  40 mg Oral Daily  . potassium chloride  20 mEq Oral BID  . prednisoLONE acetate  1 drop Left Eye Daily  . sodium chloride  3 mL Intravenous Q12H  . timolol  1 drop Left Eye Daily    OBJECTIVE  Filed Vitals:   10/03/14 0945 10/03/14 0956 10/03/14 1053 10/03/14 1100  BP: 134/80 128/79 115/79 141/82  Pulse:   87 87  Temp:      TempSrc:      Resp:      Height:      Weight:      SpO2:        Intake/Output Summary (Last 24 hours) at 10/03/14 1351 Last data filed at 10/03/14 1243  Gross per 24 hour  Intake    720 ml  Output    600 ml  Net    120 ml   Filed Weights   10/01/14 0440 10/02/14 0434 10/03/14 0401  Weight: 194 lb 3.2 oz (88.089 kg) 191 lb 5.8 oz (86.8 kg) 190 lb 4.1 oz (86.3 kg)    PHYSICAL EXAM  General: Pleasant, NAD. Neuro: Alert and oriented X 3. Moves all extremities spontaneously. Psych: Normal affect. HEENT:  Normal  Neck: Supple without bruits or JVD. Lungs:   Resp regular and unlabored, CTA. Heart: RRR no s3, s4, or murmurs. Abdomen: Soft, non-tender, non-distended, BS + x 4.  Extremities: No clubbing, cyanosis or edema. DP/PT/Radials 2+ and equal bilaterally.  Accessory Clinical Findings  CBC  Recent Labs  10/01/14 0314  WBC 13.2*  HGB 9.5*  HCT 27.3*  MCV 93.5  PLT 91*   Basic Metabolic Panel  Recent Labs  10/01/14 0314 10/03/14 0319  NA 133* 136  K 3.8 4.2  CL 104 103  CO2 23 25  GLUCOSE 149* 125*  BUN 27* 18  CREATININE 0.83 0.87  CALCIUM 8.0* 8.6*  MG  --  2.1   TELE  NSR at rate of 80s. PAT. Sinus tachy early morning  Radiology/Studies  Dg Orthopantogram  09/25/2014   CLINICAL DATA:  Dental disease  EXAM: ORTHOPANTOGRAM/PANORAMIC  COMPARISON:  None.  FINDINGS: Patient has 9 upper teeth centrally without evidence of abscess. Patient has eighth teeth in the lower mandible centrally. One LEFT lower molar. No periapical abscess.  IMPRESSION: Central upper and lower teeth without evidence of abscess.   Electronically Signed   By: Suzy Bouchard M.D.   On: 09/25/2014 16:52   Dg Chest 2 View  10/01/2014   CLINICAL DATA:  Status post CABG.  EXAM: CHEST  2 VIEW  COMPARISON:  09/30/2014.  FINDINGS: Improved platelike atelectasis at the LEFT base. Small LEFT effusion. Slight vascular congestion. Cardiomegaly. Prior CABG. Swan-Ganz introducer removed. No pneumothorax.  IMPRESSION: Slight improvement aeration.   Electronically Signed   By: Staci Righter M.D.   On: 10/01/2014 11:05   Dg Chest 2 View  09/23/2014   CLINICAL DATA:  Shortness of breath and left-sided chest pain. Left arm pain.  EXAM: CHEST  2 VIEW  COMPARISON:  09/30/2013  FINDINGS: Cardiomediastinal silhouette is normal. Mediastinal contours appear intact. The aorta is torturous and contains atherosclerotic calcifications of the arch. There is stable eventration of the left hemidiaphragm. Bilateral fullness of the hilar regions is likely related to prominence of the  main pulmonary arteries.  There is no evidence of focal airspace consolidation, pleural effusion or pneumothorax.  Osseous structures are without acute abnormality. Soft tissues are grossly normal.  IMPRESSION: No active cardiopulmonary disease.  Bilateral hilar fullness likely secondary to prominent main pulmonary arteries.   Electronically Signed   By: Fidela Salisbury M.D.   On: 09/23/2014 12:11   Dg Chest Port 1 View  09/30/2014   CLINICAL DATA:  CABG.  EXAM: PORTABLE CHEST - 1 VIEW  COMPARISON:  09/29/2014.  FINDINGS: Right IJ sheath patch that interim removal of Swan-Ganz catheter, left chest tube, mediastinal drainage catheter. Right IJ sheath in stable position. Prior CABG. Prior cardiac valve replacement. Stable cardiomegaly. Very mild increase in interstitial prominence, very mild CHF cannot be completely excluded. Low lung volumes with bibasilar atelectasis. Small left pleural effusion. No pneumothorax.  IMPRESSION: 1. Interim removal of Swan-Ganz catheter, left chest tube, mediastinal drainage catheter. Right IJ sheath in stable position. 2. Prior CABG. Prior cardiac valve replacement. Stable cardiomegaly. Very mild increase in interstitial prominence. Very mild congestive heart failure cannot be completely excluded. 3. Low lung volumes with bibasilar atelectasis. Small left pleural effusion.   Electronically Signed   By: Marcello Moores  Register   On: 09/30/2014 07:53   Dg Chest Port 1 View  09/29/2014   CLINICAL DATA:  Status post CABG and aortic valve replacement.  EXAM: PORTABLE CHEST - 1 VIEW  COMPARISON:  09/28/2014  FINDINGS: There has been interval extubation. Median sternotomy wires and postsurgical changes from CABG and aortic valve replacement are stable. Swan-Ganz catheter, left chest tube and mediastinal drain are stable.  Cardiomediastinal silhouette is normal. Mediastinal contours appear intact.  There is no evidence of pneumothorax. There are probably bilateral small pleural effusions. The  left hemidiaphragm is obscured, which may be seen with left pleural effusion and associated atelectasis, or left lower lobe consolidation.  Osseous structures are without acute abnormality. Soft tissues are grossly normal.  IMPRESSION: Status postextubation.  Low lung volumes with probable bilateral pleural effusions.  Left lower lobe atelectasis versus airspace consolidation.   Electronically Signed   By: Fidela Salisbury M.D.   On: 09/29/2014 07:57   Dg Chest Port 1 View  09/28/2014   CLINICAL DATA:  Follow-up CABG with aortic valve replacement.  EXAM: PORTABLE CHEST - 1 VIEW  COMPARISON:  09/23/2014  FINDINGS: Interval median sternotomy with CABG and aortic valve replacement. Endotracheal tube has its tip 2.5 cm above the carina. Nasogastric tube enters the abdomen.  Swan-Ganz catheter placed from a right internal jugular approach has its tip in the main pulmonary artery. Left chest tubes in place. Mediastinal drain is in place. No pneumothorax. There are changes of volume loss in both lower lobes, left worse than right.  IMPRESSION: Lines and tubes well position. Lower lobe volume loss left worse than right.   Electronically Signed   By: Nelson Chimes M.D.   On: 09/28/2014 16:05    ASSESSMENT AND PLAN   DARIO YONO is a 74 y.o. male with a history of HTN, HLD, prior tobacco abuse and no prior cardiac disease who presented to [facility] on 09/23/14 with exertional chest pain.   1. CAD -- s/p LHC on 09/23/14 with severe multivessel CAD and AS and referred for CABG and AVR. -- s/p CABG x4V and AVR on 09/29/14 -- Continue ASA, statin and BB.   2. Mod AS- S/P AVR with tissue valve-will need baseline outpatient echo following DC  3. Question Post op afib-  - likely short burst of PAT. This morning he was in sinus tachy - lopressor increased further to 50mg  BID. Currently in sinus rhythm at rate stable at 70-80s.  - no indication of anticoagulation or antiarrhythmic at this time.    4. Post of CHF- 2D  ECHO with normal LVEF and G1DD. -- He now appears euvolemic after diruesis. Continue lasix 40mg  po qd.  Signed, Camie Hauss PA-C

## 2014-10-03 NOTE — Care Management Note (Addendum)
Case Management Note Marvetta Gibbons RN, BSN Unit 2W-Case Manager [phone]  Patient Details  Name: [name] [name] MRN: [id number] Date of Birth: [birth year]  Subjective/Objective:      Pt admitted with NSTEMI- per CATH found MVD-plan for CABG/AVR on 09/28/14              Action/Plan: PTA pt lived at home- NCM to follow post op for d/c needs/planning  Expected Discharge Date:                 Expected Discharge Plan:  Home/Self Care  In-House Referral:     Discharge planning Services  CM Consult  Post Acute Care Choice:    Choice offered to:     DME Arranged:    DME Agency:     HH Arranged:    [place] Agency:     Status of Service:  In process, will continue to follow  Medicare Important Message Given:  Yes-third notification given Date Medicare IM Given:    Medicare IM give by:    Date Additional Medicare IM Given:    Additional Medicare Important Message give by:     If discussed at [place] of Stay Meetings, dates discussed:  10/04/14  Additional Comments:  10/03/14- Marvetta Gibbons RN, BSN - per cardiac rehab- pt would now like a RW for home- the walker he has apparently does not have wheels- have left a sticky note for MD to place DME order- NCM to follow - once order placed DME can be delivered to room prior to discharge.   10/02/14- CM spoke to pt and family at the bedside who states that pt independent at home prior to admission and anticipate pt being independent at home after. Pt's spouse at the bedside who shared that she would be able to be with the pt 24/7. Pt denies DME needs but state that they have a BSC and walker at home if needed. No further discharge needs communicated at this time and CM will monitor for additional d/c needs.-- per Marcy Panning CM note    Dawayne Patricia, RN 10/03/2014, 10:12 AM

## 2014-10-03 NOTE — Progress Notes (Signed)
Utilization review completed.  

## 2014-10-03 NOTE — Progress Notes (Signed)
CARDIAC REHAB PHASE I   PRE:  Rate/Rhythm: 40 SR with PVCs    BP: sitting 141/82    SaO2: 96 RA  MODE:  Ambulation: 550 ft   POST:  Rate/Rhythm: 91 SR    BP: sitting 142/96     SaO2: 98 RA  Tolerated well with RW, independent. HR maintained NSR throughout walk. To recliner. This was second walk today. He would like RW for home. [phone]   Josephina Shih [place] CES, ACSM 10/03/2014 11:52 AM

## 2014-10-03 NOTE — Progress Notes (Signed)
Nursing note Patient EPW pulled per protocol and as ordered all ends intact pt reminded to lie supine approximately one hour.bp 128/79  Will monitor patient. Treven Holtman, Bettina Gavia RN

## 2014-10-03 NOTE — Progress Notes (Addendum)
      [address]       [place] [address]             [phone]      5 Days Post-Op Procedure(s) (LRB): CORONARY ARTERY BYPASS GRAFTING (CABG) x4 using left internal mammory artery and right saphenous leg vein harvested endoscopically. (N/A) AORTIC VALVE REPLACEMENT (AVR) (N/A) TRANSESOPHAGEAL ECHOCARDIOGRAM (TEE) (N/A)   Subjective:  [name] [name] has no complaints.  He would like to get out of here. + ambulation + BM Objective: Vital signs in last 24 hours: Temp:  [98.3 F (36.8 C)-98.5 F (36.9 C)] 98.5 F (36.9 C) (09/12 0401) Pulse Rate:  [82-87] 83 (09/12 0401) Cardiac Rhythm:  [-] Normal sinus rhythm (09/11 2000) Resp:  [16-18] 16 (09/12 0401) BP: (116-131)/(73-83) 131/83 mmHg (09/12 0401) SpO2:  [95 %-96 %] 96 % (09/12 0401) Weight:  [190 lb 4.1 oz (86.3 kg)] 190 lb 4.1 oz (86.3 kg) (09/12 0401)  Intake/Output from previous day: 09/11 0701 - 09/12 0700 In: 720 [P.O.:720] Out: 551 [Urine:550; Stool:1]  General appearance: alert, cooperative and no distress Heart: regular rate and rhythm Lungs: clear to auscultation bilaterally Abdomen: soft, non-tender; bowel sounds normal; no masses,  no organomegaly Extremities: edema trace Wound: clean and dry  Lab Results:  Recent Labs  10/01/14 0314  WBC 13.2*  HGB 9.5*  HCT 27.3*  PLT 91*   BMET:  Recent Labs  10/01/14 0314 10/03/14 0319  NA 133* 136  K 3.8 4.2  CL 104 103  CO2 23 25  GLUCOSE 149* 125*  BUN 27* 18  CREATININE 0.83 0.87  CALCIUM 8.0* 8.6*    PT/INR: No results for input(s): LABPROT, INR in the last 72 hours. ABG    Component Value Date/Time   PHART 7.358 09/28/2014 1957   HCO3 20.5 09/28/2014 1957   TCO2 21 09/29/2014 1643   ACIDBASEDEF 5.0* 09/28/2014 1957   O2SAT 94.0 09/28/2014 1957   CBG (last 3)   Recent Labs  10/01/14 0603 10/01/14 0618 10/01/14 2122  GLUCAP 129* 114* 145*    Assessment/Plan: S/P Procedure(s) (LRB): CORONARY ARTERY BYPASS  GRAFTING (CABG) x4 using left internal mammory artery and right saphenous leg vein harvested endoscopically. (N/A) AORTIC VALVE REPLACEMENT (AVR) (N/A) TRANSESOPHAGEAL ECHOCARDIOGRAM (TEE) (N/A)  1. CV- episodes of PAT vs Flutter, this morning appears to be in Sinus Tach- will increase Lopressor to 50 mg BID, will d/c EPW 2. Pulm- no acute issues, continue IS 3. Renal- creatinine WNL, remains hypervolemic continue Lasix 4. Dispo- patient with sinus tach, increase Lopressor to 50 mg BID, d/c EPW, possibly home soon   LOS: 10 days    [name], [name] 10/03/2014  Patient seen and examined, agree with above He had 2 brief episodes of tachycardia to the 140s this AM Lopressor increased to 50 mg BID- hopefully that will normalize rhythm Home when rhythm issues settled  [name] [name] C. [name], MD [facility] [phone]

## 2014-10-04 ENCOUNTER — Other Ambulatory Visit: Payer: Self-pay | Admitting: Physician Assistant

## 2014-10-04 DIAGNOSIS — I359 Nonrheumatic aortic valve disorder, unspecified: Secondary | ICD-10-CM

## 2014-10-04 DIAGNOSIS — Z954 Presence of other heart-valve replacement: Secondary | ICD-10-CM

## 2014-10-04 DIAGNOSIS — I491 Atrial premature depolarization: Secondary | ICD-10-CM

## 2014-10-04 MED ORDER — ASPIRIN 325 MG PO TBEC: 325.0000 mg | DELAYED_RELEASE_TABLET | Freq: Every day | ORAL | Status: DC

## 2014-10-04 MED ORDER — TRAMADOL HCL 50 MG PO TABS: 50.0000 mg | ORAL_TABLET | ORAL | Status: DC | PRN

## 2014-10-04 MED ORDER — LISINOPRIL 5 MG PO TABS: 5.0000 mg | ORAL_TABLET | Freq: Every day | ORAL | Status: DC

## 2014-10-04 MED ORDER — METOPROLOL TARTRATE 50 MG PO TABS: 50.0000 mg | ORAL_TABLET | Freq: Two times a day (BID) | ORAL | Status: DC

## 2014-10-04 MED ORDER — OXYCODONE HCL 5 MG PO TABS: 5.0000 mg | ORAL_TABLET | ORAL | Status: DC | PRN

## 2014-10-04 MED ORDER — POTASSIUM CHLORIDE CRYS ER 20 MEQ PO TBCR: 20.0000 meq | EXTENDED_RELEASE_TABLET | Freq: Two times a day (BID) | ORAL | Status: DC

## 2014-10-04 MED ORDER — FUROSEMIDE 40 MG PO TABS: 40.0000 mg | ORAL_TABLET | Freq: Every day | ORAL | Status: DC

## 2014-10-04 NOTE — Care Management Note (Signed)
Case Management Note Marvetta Gibbons RN, BSN Unit 2W-Case Manager [phone]  Patient Details  Name: [name] [name] MRN: [id number] Date of Birth: May 29, 1940  Subjective/Objective:      Pt admitted with NSTEMI- per CATH found MVD-plan for CABG/AVR on 09/28/14              Action/Plan: PTA pt lived at home- NCM to follow post op for d/c needs/planning  Expected Discharge Date:      10/04/14           Expected Discharge Plan:  Home/Self Care  In-House Referral:     Discharge planning Services  CM Consult  Post Acute Care Choice:  Durable Medical Equipment Choice offered to:  Patient  DME Arranged:  Gilford Rile rolling DME Agency:  [place] Arranged:  NA [employer] Agency:  NA  Status of Service:  Completed, signed off  Medicare Important Message Given:  Yes-third notification given Date Medicare IM Given:    Medicare IM give by:    Date Additional Medicare IM Given:    Additional Medicare Important Message give by:     If discussed at [place] of Stay Meetings, dates discussed:  10/04/14  Additional Comments:  10/04/14- Marvetta Gibbons RN, BSN - call made to [facility] with [facility] for DME- RW- to be delivered to room prior to discharge  10/03/14- Marvetta Gibbons RN, BSN - per cardiac rehab- pt would now like a RW for home- the walker he has apparently does not have wheels- have left a sticky note for MD to place DME order- NCM to follow - once order placed DME can be delivered to room prior to discharge.   10/02/14- CM spoke to pt and family at the bedside who states that pt independent at home prior to admission and anticipate pt being independent at home after. Pt's spouse at the bedside who shared that she would be able to be with the pt 24/7. Pt denies DME needs but state that they have a BSC and walker at home if needed. No further discharge needs communicated at this time and CM will monitor for additional d/c needs.-- per Marcy Panning CM note    Dawayne Patricia,  RN 10/04/2014, 12:29 PM

## 2014-10-04 NOTE — Progress Notes (Signed)
6 Days Post-Op Procedure(s) (LRB): CORONARY ARTERY BYPASS GRAFTING (CABG) x4 using left internal mammory artery and right saphenous leg vein harvested endoscopically. (N/A) AORTIC VALVE REPLACEMENT (AVR) (N/A) TRANSESOPHAGEAL ECHOCARDIOGRAM (TEE) (N/A) Subjective: No complaints this AM, anxious to go home  Objective: Vital signs in last 24 hours: Temp:  [98.1 F (36.7 C)-98.6 F (37 C)] 98.6 F (37 C) (09/13 0500) Pulse Rate:  [80-87] 86 (09/13 0500) Cardiac Rhythm:  [-] Normal sinus rhythm (09/12 1921) Resp:  [18-20] 18 (09/13 0500) BP: (114-147)/(71-92) 127/88 mmHg (09/13 0500) SpO2:  [96 %-97 %] 96 % (09/13 0500) Weight:  [186 lb 12.8 oz (84.732 kg)] 186 lb 12.8 oz (84.732 kg) (09/13 0500)  Hemodynamic parameters for last 24 hours:    Intake/Output from previous day: 09/12 0701 - 09/13 0700 In: 720 [P.O.:720] Out: 1100 [Urine:1100] Intake/Output this shift:    General appearance: alert and no distress Neurologic: intact Heart: regular rate and rhythm Lungs: clear to auscultation bilaterally Wound: clean and dry  Lab Results: No results for input(s): WBC, HGB, HCT, PLT in the last 72 hours. BMET:  Recent Labs  10/03/14 0319  NA 136  K 4.2  CL 103  CO2 25  GLUCOSE 125*  BUN 18  CREATININE 0.87  CALCIUM 8.6*    PT/INR: No results for input(s): LABPROT, INR in the last 72 hours. ABG    Component Value Date/Time   PHART 7.358 09/28/2014 1957   HCO3 20.5 09/28/2014 1957   TCO2 21 09/29/2014 1643   ACIDBASEDEF 5.0* 09/28/2014 1957   O2SAT 94.0 09/28/2014 1957   CBG (last 3)   Recent Labs  10/01/14 2122 10/03/14 1627 10/03/14 2138  GLUCAP 145* 118* 120*    Assessment/Plan: S/P Procedure(s) (LRB): CORONARY ARTERY BYPASS GRAFTING (CABG) x4 using left internal mammory artery and right saphenous leg vein harvested endoscopically. (N/A) AORTIC VALVE REPLACEMENT (AVR) (N/A) TRANSESOPHAGEAL ECHOCARDIOGRAM (TEE) (N/A) -  Looks good In SR currently Did  have some transient irregular rhythms earlier this AM Ready for dc, will check with Cardiology to make sure there are no rhythm concerns   LOS: 11 days    [name] 10/04/2014

## 2014-10-04 NOTE — Progress Notes (Signed)
Patient Name: [name] [name] Date of Encounter: 10/04/2014  Principal Problem:   Unstable angina pectoris Active Problems:   Cardiac murmur, previously undiagnosed   Coronary artery disease involving native heart with unstable angina pectoris   Unstable angina   S/P CABG x 4   Bradycardia   S/P CABG (coronary artery bypass graft)  SUBJECTIVE  Ambulating without difficulty. Feeling well. No chest pain, sob or palpitations.  Wants to go home.  CURRENT MEDS . acyclovir  400 mg Oral BID  . aspirin EC  325 mg Oral Daily   Or  . aspirin  324 mg Per Tube Daily  . atorvastatin  80 mg Oral q1800  . bisacodyl  10 mg Oral Daily   Or  . bisacodyl  10 mg Rectal Daily  . docusate sodium  200 mg Oral Daily  . furosemide  40 mg Oral Daily  . metoprolol tartrate  50 mg Oral BID  . oxybutynin  5 mg Oral QHS  . pantoprazole  40 mg Oral Daily  . potassium chloride  20 mEq Oral BID  . prednisoLONE acetate  1 drop Left Eye Daily  . sodium chloride  3 mL Intravenous Q12H  . timolol  1 drop Left Eye Daily    OBJECTIVE  Filed Vitals:   10/03/14 1405 10/03/14 2106 10/04/14 0500 10/04/14 1012  BP: 114/71 130/79 127/88   Pulse: 80 83 86 86  Temp: 98.1 F (36.7 C) 98.6 F (37 C) 98.6 F (37 C)   TempSrc: Oral Oral Oral   Resp: 20 18 18    Height:      Weight:   84.732 kg (186 lb 12.8 oz)   SpO2: 97% 97% 96%     Intake/Output Summary (Last 24 hours) at 10/04/14 1215 Last data filed at 10/04/14 0900  Gross per 24 hour  Intake    720 ml  Output   1250 ml  Net   -530 ml   Filed Weights   10/02/14 0434 10/03/14 0401 10/04/14 0500  Weight: 86.8 kg (191 lb 5.8 oz) 86.3 kg (190 lb 4.1 oz) 84.732 kg (186 lb 12.8 oz)    PHYSICAL EXAM  General: Pleasant, NAD. Neuro: Alert and oriented X 3. Moves all extremities spontaneously. Psych: Normal affect. HEENT:  Normal  Neck: Supple without bruits or JVD. Lungs:  Resp regular and unlabored, CTA. Heart: RRR no s3, s4, or  murmurs. Abdomen: Soft, non-tender, non-distended, BS + x 4.  Extremities: No clubbing, cyanosis or edema. DP/PT/Radials 2+ and equal bilaterally.  Accessory Clinical Findings  CBC No results for input(s): WBC, NEUTROABS, HGB, HCT, MCV, PLT in the last 72 hours. Basic Metabolic Panel  Recent Labs  10/03/14 0319  NA 136  K 4.2  CL 103  CO2 25  GLUCOSE 125*  BUN 18  CREATININE 0.87  CALCIUM 8.6*  MG 2.1   TELE  NSR at rate of 80s. PAT. Sinus tachy early morning  Radiology/Studies  Dg Orthopantogram  09/25/2014   CLINICAL DATA:  Dental disease  EXAM: ORTHOPANTOGRAM/PANORAMIC  COMPARISON:  None.  FINDINGS: Patient has 9 upper teeth centrally without evidence of abscess. Patient has eighth teeth in the lower mandible centrally. One LEFT lower molar. No periapical abscess.  IMPRESSION: Central upper and lower teeth without evidence of abscess.   Electronically Signed   By: Suzy Bouchard M.D.   On: 09/25/2014 16:52   Dg Chest 2 View  10/01/2014   CLINICAL DATA:  Status post CABG.  EXAM: CHEST  2 VIEW  COMPARISON:  09/30/2014.  FINDINGS: Improved platelike atelectasis at the LEFT base. Small LEFT effusion. Slight vascular congestion. Cardiomegaly. Prior CABG. Swan-Ganz introducer removed. No pneumothorax.  IMPRESSION: Slight improvement aeration.   Electronically Signed   By: Staci Righter M.D.   On: 10/01/2014 11:05   Dg Chest 2 View  09/23/2014   CLINICAL DATA:  Shortness of breath and left-sided chest pain. Left arm pain.  EXAM: CHEST  2 VIEW  COMPARISON:  09/30/2013  FINDINGS: Cardiomediastinal silhouette is normal. Mediastinal contours appear intact. The aorta is torturous and contains atherosclerotic calcifications of the arch. There is stable eventration of the left hemidiaphragm. Bilateral fullness of the hilar regions is likely related to prominence of the main pulmonary arteries.  There is no evidence of focal airspace consolidation, pleural effusion or pneumothorax.  Osseous  structures are without acute abnormality. Soft tissues are grossly normal.  IMPRESSION: No active cardiopulmonary disease.  Bilateral hilar fullness likely secondary to prominent main pulmonary arteries.   Electronically Signed   By: Fidela Salisbury M.D.   On: 09/23/2014 12:11   Dg Chest Port 1 View  09/30/2014   CLINICAL DATA:  CABG.  EXAM: PORTABLE CHEST - 1 VIEW  COMPARISON:  09/29/2014.  FINDINGS: Right IJ sheath patch that interim removal of Swan-Ganz catheter, left chest tube, mediastinal drainage catheter. Right IJ sheath in stable position. Prior CABG. Prior cardiac valve replacement. Stable cardiomegaly. Very mild increase in interstitial prominence, very mild CHF cannot be completely excluded. Low lung volumes with bibasilar atelectasis. Small left pleural effusion. No pneumothorax.  IMPRESSION: 1. Interim removal of Swan-Ganz catheter, left chest tube, mediastinal drainage catheter. Right IJ sheath in stable position. 2. Prior CABG. Prior cardiac valve replacement. Stable cardiomegaly. Very mild increase in interstitial prominence. Very mild congestive heart failure cannot be completely excluded. 3. Low lung volumes with bibasilar atelectasis. Small left pleural effusion.   Electronically Signed   By: Marcello Moores  Register   On: 09/30/2014 07:53   Dg Chest Port 1 View  09/29/2014   CLINICAL DATA:  Status post CABG and aortic valve replacement.  EXAM: PORTABLE CHEST - 1 VIEW  COMPARISON:  09/28/2014  FINDINGS: There has been interval extubation. Median sternotomy wires and postsurgical changes from CABG and aortic valve replacement are stable. Swan-Ganz catheter, left chest tube and mediastinal drain are stable.  Cardiomediastinal silhouette is normal. Mediastinal contours appear intact.  There is no evidence of pneumothorax. There are probably bilateral small pleural effusions. The left hemidiaphragm is obscured, which may be seen with left pleural effusion and associated atelectasis, or left lower  lobe consolidation.  Osseous structures are without acute abnormality. Soft tissues are grossly normal.  IMPRESSION: Status postextubation.  Low lung volumes with probable bilateral pleural effusions.  Left lower lobe atelectasis versus airspace consolidation.   Electronically Signed   By: Fidela Salisbury M.D.   On: 09/29/2014 07:57   Dg Chest Port 1 View  09/28/2014   CLINICAL DATA:  Follow-up CABG with aortic valve replacement.  EXAM: PORTABLE CHEST - 1 VIEW  COMPARISON:  09/23/2014  FINDINGS: Interval median sternotomy with CABG and aortic valve replacement. Endotracheal tube has its tip 2.5 cm above the carina. Nasogastric tube enters the abdomen. Swan-Ganz catheter placed from a right internal jugular approach has its tip in the main pulmonary artery. Left chest tubes in place. Mediastinal drain is in place. No pneumothorax. There are changes of volume loss in both lower lobes, left worse than right.  IMPRESSION: Lines  and tubes well position. Lower lobe volume loss left worse than right.   Electronically Signed   By: Nelson Chimes M.D.   On: 09/28/2014 16:05    ASSESSMENT AND PLAN   BURTIS IMHOFF is a 74 y.o. male with a history of HTN, HLD, prior tobacco abuse and no prior cardiac disease who presented to [facility] on 09/23/14 with exertional chest pain.   1. CAD -- s/p LHC on 09/23/14 with severe multivessel CAD and AS and referred for CABG and AVR. -- s/p CABG x4V and AVR on 09/29/14 -- Continue ASA, statin and BB. Can discharge on ASA 81mg  daily.  2. Mod AS- S/P AVR with tissue valve- Outpatient echo ordered for 11/01/14 to establish baseline for AVR.  3. Question Post op afib- Telemetry reviewed.  Sinus rhythm with PACs.  Rates mostly in the 57s. - Continue lopressor 50mg  BID. Currently in sinus rhythm at rate stable at 70-80s.  - no indication of anticoagulation or antiarrhythmic at this time.   4. Post of CHF- 2D ECHO with normal LVEF and G1DD. -- He now appears euvolemic after diruesis.  Continue lasix 40mg  po qd.  Dispo: OK to discharge from Cardiology perspective.   Signed, Sharol Harness MD

## 2014-10-04 NOTE — Progress Notes (Signed)
Pt/family given discharge instructions, medication lists, follow up appointments, and when to call the doctor.  Pt/family verbalizes understanding. Pt given signs and symptoms of infection. Kaelani Kendrick McClintock, RN    

## 2014-10-04 NOTE — Progress Notes (Signed)
CARDIAC REHAB PHASE I   PRE:  Rate/Rhythm: 80 sR  BP:  Sitting: 118/88        SaO2: 97 Ra  MODE:  Ambulation: 490 ft   POST:  Rate/Rhythm: 85 SR  BP:  Sitting: 142/89         SaO2: 100 RA  Reviewed [facility] discharge education with pt and wife at bedside including IS, sternal precautions, activity progression, exercise, sodium restrictions, daily weights and phase 2 cardiac rehab (gave [facility] brochure). Pt and wife verbalized understanding. Pt asking about rolling walker at dischage, CM notified. Pt also concerned his LUE is more swollen and bruised than before, c/o mild pain at site. RN aware. Pt ambulated 490 ft on RA, rolling walker, hand held assist, steady gait, tolerated well. Pt denies CP, dizziness, DOE, declined rest stop. Pt HR 84-89 SR during ambulation. Pt to recliner after walk, call bell within reach. Pt hoping to go home today.   [id number] Lenna Sciara, RN, BSN 10/04/2014 11:53 AM

## 2014-10-14 ENCOUNTER — Ambulatory Visit (INDEPENDENT_AMBULATORY_CARE_PROVIDER_SITE_OTHER): Payer: Self-pay | Admitting: *Deleted

## 2014-10-14 ENCOUNTER — Other Ambulatory Visit: Payer: Self-pay | Admitting: *Deleted

## 2014-10-14 DIAGNOSIS — I2510 Atherosclerotic heart disease of native coronary artery without angina pectoris: Secondary | ICD-10-CM

## 2014-10-14 DIAGNOSIS — G8918 Other acute postprocedural pain: Secondary | ICD-10-CM

## 2014-10-14 DIAGNOSIS — Z951 Presence of aortocoronary bypass graft: Secondary | ICD-10-CM

## 2014-10-14 DIAGNOSIS — I350 Nonrheumatic aortic (valve) stenosis: Secondary | ICD-10-CM

## 2014-10-14 DIAGNOSIS — Z952 Presence of prosthetic heart valve: Secondary | ICD-10-CM

## 2014-10-14 MED ORDER — TRAMADOL HCL 50 MG PO TABS: 50.0000 mg | ORAL_TABLET | Freq: Four times a day (QID) | ORAL | Status: DC | PRN

## 2014-10-14 NOTE — Progress Notes (Signed)
[name] had CABG X 4/AVR on 09/28/14 and was discharged on 10/04/14. He returns today with concerns of yellow drainage from one of his chest tube sites.  Both sites are overly steristripped.  These were removed and there is thick yel;low drainage from one of the chest tube sites and the site has opened.  The are was cleansed with peroxide, then cleansed with normal saline.  The opening is not deep and is clean. I instructed he and his wife to do the same wound care 2-3 times a day and cover with a bandaide. A new Tramadol script was faxed to their pharmacy.  He will return as scheduled with a cxr,  He will call if the site does not heal.

## 2014-10-17 ENCOUNTER — Encounter: Payer: Self-pay | Admitting: *Deleted

## 2014-10-18 ENCOUNTER — Ambulatory Visit: Payer: Medicare Other | Admitting: Cardiology

## 2014-10-21 ENCOUNTER — Telehealth: Payer: Self-pay | Admitting: *Deleted

## 2014-10-21 NOTE — Telephone Encounter (Signed)
Faxed -signed cardiac rehab phase II

## 2014-10-24 ENCOUNTER — Other Ambulatory Visit: Payer: Self-pay | Admitting: Thoracic Surgery (Cardiothoracic Vascular Surgery)

## 2014-10-24 DIAGNOSIS — Z951 Presence of aortocoronary bypass graft: Secondary | ICD-10-CM

## 2014-10-25 ENCOUNTER — Ambulatory Visit
Admission: RE | Admit: 2014-10-25 | Discharge: 2014-10-25 | Disposition: A | Discharge status: Home / Self Care | Payer: Medicare Other | Source: Ambulatory Visit | Attending: Thoracic Surgery (Cardiothoracic Vascular Surgery) | Admitting: Thoracic Surgery (Cardiothoracic Vascular Surgery)

## 2014-10-25 ENCOUNTER — Ambulatory Visit (INDEPENDENT_AMBULATORY_CARE_PROVIDER_SITE_OTHER): Payer: Self-pay | Admitting: Thoracic Surgery (Cardiothoracic Vascular Surgery)

## 2014-10-25 ENCOUNTER — Encounter: Payer: Self-pay | Admitting: Thoracic Surgery (Cardiothoracic Vascular Surgery)

## 2014-10-25 VITALS — BP 138/86 | HR 60 | Resp 20 | Ht 74.0 in | Wt 183.0 lb

## 2014-10-25 DIAGNOSIS — Z951 Presence of aortocoronary bypass graft: Secondary | ICD-10-CM

## 2014-10-25 DIAGNOSIS — I350 Nonrheumatic aortic (valve) stenosis: Secondary | ICD-10-CM

## 2014-10-25 DIAGNOSIS — Z952 Presence of prosthetic heart valve: Secondary | ICD-10-CM

## 2014-10-25 DIAGNOSIS — Z954 Presence of other heart-valve replacement: Secondary | ICD-10-CM

## 2014-10-25 DIAGNOSIS — I2510 Atherosclerotic heart disease of native coronary artery without angina pectoris: Secondary | ICD-10-CM

## 2014-10-25 MED ORDER — PREDNISONE 5 MG PO TABS: ORAL_TABLET | ORAL | Status: DC

## 2014-10-25 NOTE — Progress Notes (Signed)
[address]       [place] [address]             [phone]       HPI: Mr. Shearer returns today for scheduled postoperative follow-up visit  He is a 74 year old man who had aortic valve replacement and coronary bypass grafting 4 on 09/28/2014. We used a bovine pericardial prosthetic valve. He had some transient arrhythmias postop, PAT versus atrial fib/ flutter, but with home in sinus rhythm. He did not require anticoagulation.  He has been feeling well. He has lost some weight. His appetite is improving. He still sleeping in a recliner. He is only taking tramadol for pain at night before he goes to bed. He does not needed during the day. He has not had any angina. He does get tired easily. He complains that his voice starts out normal and then gets weak when he is talking to people on the telephone.  Past Medical History  Diagnosis Date  . Hypertension   . Hyperlipidemia   . Shingles   . GERD (gastroesophageal reflux disease)   . Cataracts, bilateral   . Glaucoma       Current Outpatient Prescriptions  Medication Sig Dispense Refill  . acyclovir (ZOVIRAX) 800 MG tablet Take 400 mg by mouth 2 (two) times daily.    Marland Kitchen aspirin EC 325 MG EC tablet Take 1 tablet (325 mg total) by mouth daily. 30 tablet 0  . lansoprazole (PREVACID) 30 MG capsule Take 30 mg by mouth daily at 12 noon.    Marland Kitchen lisinopril (PRINIVIL,ZESTRIL) 5 MG tablet Take 1 tablet (5 mg total) by mouth daily. 30 tablet 3  . meloxicam (MOBIC) 15 MG tablet Take 15 mg by mouth daily.    . metoprolol (LOPRESSOR) 50 MG tablet Take 1 tablet (50 mg total) by mouth 2 (two) times daily. 60 tablet 3  . Multiple Vitamins-Minerals (MULTIVITAMIN WITH MINERALS) tablet Take 1 tablet by mouth daily.    Marland Kitchen oxybutynin (DITROPAN-XL) 5 MG 24 hr tablet Take 5 mg by mouth at bedtime.    Marland Kitchen oxyCODONE (OXY IR/ROXICODONE) 5 MG immediate release tablet Take 1-2 tablets (5-10 mg total) by mouth every 3 (three) hours as needed  for severe pain. 30 tablet 0  . prednisoLONE acetate (PRED FORTE) 1 % ophthalmic suspension Place 1 drop into the left eye daily.    . sildenafil (REVATIO) 20 MG tablet Take 20 mg by mouth daily as needed.    . simvastatin (ZOCOR) 40 MG tablet Take 20 mg by mouth daily.    . timolol (BETIMOL) 0.5 % ophthalmic solution Place 1 drop into the left eye daily.    . traMADol (ULTRAM) 50 MG tablet Take 1-2 tablets (50-100 mg total) by mouth every 6 (six) hours as needed for moderate pain. 40 tablet 0  . predniSONE (DELTASONE) 5 MG tablet Take 5 tablets PO day 1, 4 tabs day 2, 3 tabs day 3, 2 tabs day 4, 1 tab day 5 15 tablet 0   No current facility-administered medications for this visit.    Physical Exam BP 138/86 mmHg  Pulse 60  Resp 20  Ht 6\' 2"  (1.88 m)  Wt 183 lb (83.008 kg)  BMI 23.49 kg/m2  SpO64 80% 74 year old man in no acute distress Well-developed and well-nourished Alert and oriented 3 with no focal deficits Lungs with diminished breath sounds at left base Cardiac regular rate and rhythm normal S1 and S2 Sternum stable, sternal  incision healing well Leg incisions healing well No peripheral edema  Diagnostic Tests: I personally reviewed his chest x-ray shows some opacity at the left base. It is unclear whether this is strictly a pleural effusion or whether there may be a component of an elevated left hemidiaphragm.  Impression: 74 year old gentleman who is now almost a month out from aortic valve replacement and coronary bypass grafting 4. Overall he's doing very well. His exercise tolerance is good and he continues to improve. He is having much less pain and is only having to take tramadol at night before he goes to bed.  His chest x-ray was interpreted by radiology as showing a left pleural effusion. The opacity may be all fluid or may be a combination of some pleural fluid and mildly elevated diaphragm. In any event, I don't think there is sufficient indication to warrant  thoracentesis currently. I do think a prednisone taper would be reasonable and I ordered that. I'll plan to get another chest x-ray on him in about a month and check on that issue.  I encouraged him to continue ambulating. He can start to push himself a little bit now. He will wait 2 more weeks before he starts driving. He is not to lift anything over 10 pounds for 2 weeks or anything over 20 pounds for 4 weeks. Other than that his activities are unrestricted.  He has an appointment with Dr. Oval Linsey next week.  Plan: Prednisone taper  I will see him back in 4 weeks to check on his progress.  [name], MD [facility] [phone]

## 2014-11-01 ENCOUNTER — Encounter: Payer: Self-pay | Admitting: Cardiovascular Disease

## 2014-11-01 ENCOUNTER — Ambulatory Visit (INDEPENDENT_AMBULATORY_CARE_PROVIDER_SITE_OTHER): Payer: Medicare Other | Admitting: Cardiovascular Disease

## 2014-11-01 ENCOUNTER — Ambulatory Visit (HOSPITAL_COMMUNITY): Payer: Medicare Other | Attending: Cardiovascular Disease

## 2014-11-01 ENCOUNTER — Other Ambulatory Visit: Payer: Self-pay

## 2014-11-01 VITALS — BP 160/100 | HR 69 | Ht 74.0 in | Wt 185.2 lb

## 2014-11-01 DIAGNOSIS — I340 Nonrheumatic mitral (valve) insufficiency: Secondary | ICD-10-CM | POA: Insufficient documentation

## 2014-11-01 DIAGNOSIS — I359 Nonrheumatic aortic valve disorder, unspecified: Secondary | ICD-10-CM | POA: Diagnosis present

## 2014-11-01 DIAGNOSIS — I10 Essential (primary) hypertension: Secondary | ICD-10-CM

## 2014-11-01 DIAGNOSIS — Z951 Presence of aortocoronary bypass graft: Secondary | ICD-10-CM

## 2014-11-01 DIAGNOSIS — Z954 Presence of other heart-valve replacement: Secondary | ICD-10-CM

## 2014-11-01 DIAGNOSIS — I517 Cardiomegaly: Secondary | ICD-10-CM | POA: Insufficient documentation

## 2014-11-01 DIAGNOSIS — Z952 Presence of prosthetic heart valve: Secondary | ICD-10-CM

## 2014-11-01 DIAGNOSIS — E785 Hyperlipidemia, unspecified: Secondary | ICD-10-CM | POA: Insufficient documentation

## 2014-11-01 DIAGNOSIS — I25110 Atherosclerotic heart disease of native coronary artery with unstable angina pectoris: Secondary | ICD-10-CM

## 2014-11-01 MED ORDER — ASPIRIN EC 81 MG PO TBEC: 81.0000 mg | DELAYED_RELEASE_TABLET | Freq: Every day | ORAL | Status: DC

## 2014-11-01 MED ORDER — LISINOPRIL 20 MG PO TABS: 20.0000 mg | ORAL_TABLET | Freq: Every day | ORAL | Status: DC

## 2014-11-01 NOTE — Patient Instructions (Signed)
Your physician has recommended you make the following change in your medication:   1.) the lisinopril has been increased to 20 mg daily. A new prescription has been provided to you today.  2.) decrease your aspirin to 81 mg daily from 325 mg.  Your physician wants you to follow-up in: 1 year or sooner if needed with Dr. Oval Linsey. You will receive a reminder letter in the mail two months in advance. If you don't receive a letter, please call our office to schedule the follow-up appointment.

## 2014-11-01 NOTE — Progress Notes (Signed)
Cardiology Office Note   Date:  11/01/2014   ID:  [name] [name], DOB December 08, 1940, MRN [id number]  PCP:  [name], MD  Cardiologist:   Sharol Harness, MD   Chief Complaint  Patient presents with  . Hospitalization Follow-up    having some SOB since leaving the hospital.      History of Present Illness: [name] [name] is a 74 y.o. male with hypertension, CAD s/p CABG (LIMA-->LAD, SVG-->RI/OM1, SVG_>PDA), bradycardia, and syncope who presents for follow up s/p CABG and AVR.  [name] [name] underwent 4 vessel CABG and AVR (25 mm [facility] Ease pericardial tissue valve) on 09/28/14.  His post-operative course was complicated by atrial fibrillation/flutter, but he was discharged in sinus rhythm.  Since discharge [name] [name] has been doing well. He has [place]. He denies any chest pain or shortness of breath with this activity. He also denies any lower extremity edema, orthopnea or PND.  He has not noted any palpitations, though he did not know when he was in atrial fibrillation during the hospitalization. His only complaint is that he has some changes in his voice and hoarseness after talking for prolonged periods of time. He attributes this to having been intubated.  He is awaiting the results of this appointment before starting cardiac rehabilitation.  [name] [name] followed up with [name] on 10/4 and was doing well from a surgical perspective.  [name] [name] checks his blood pressure at home. It typically runs in the 120s over 80s. In the past he took lisinopril 20 mg daily, though this was decreased to 5 mg at discharge. He had one episode of syncope 2 years ago. At that time one of his antihypertensives were stopped but later started back on his PCP due to hypertension.   Past Medical History  Diagnosis Date  . Hypertension   . Hyperlipidemia   . Shingles   . GERD (gastroesophageal reflux disease)   . Cataracts, bilateral   . Glaucoma      Past Surgical History  Procedure Laterality Date  . Cataract extraction    . Prostatectomy    . Cardiac catheterization N/A 09/23/2014    Procedure: Left Heart Cath and Coronary Angiography;  Surgeon: [name] Man, MD;  Location: [place] [facility];  Service: Cardiovascular;  Laterality: N/A;  . Coronary artery bypass graft N/A 09/28/2014    Procedure: CORONARY ARTERY BYPASS GRAFTING (CABG) x4 using left internal mammory artery and right saphenous leg vein harvested endoscopically.;  Surgeon: [name] Nakayama, MD;  Location: [place];  Service: Open Heart Surgery;  Laterality: N/A;  . Aortic valve replacement N/A 09/28/2014    Procedure: AORTIC VALVE REPLACEMENT (AVR);  Surgeon: [name] Nakayama, MD;  Location: [place];  Service: Open Heart Surgery;  Laterality: N/A;  . Tee without cardioversion N/A 09/28/2014    Procedure: TRANSESOPHAGEAL ECHOCARDIOGRAM (TEE);  Surgeon: [name] Nakayama, MD;  Location: [place];  Service: Open Heart Surgery;  Laterality: N/A;     Current Outpatient Prescriptions  Medication Sig Dispense Refill  . acyclovir (ZOVIRAX) 800 MG tablet Take 400 mg by mouth 2 (two) times daily.    Marland Kitchen aspirin EC 325 MG EC tablet Take 1 tablet (325 mg total) by mouth daily. 30 tablet 0  . lansoprazole (PREVACID) 30 MG capsule Take 30 mg by mouth daily at 12 noon.    Marland Kitchen lisinopril (PRINIVIL,ZESTRIL) 5 MG tablet Take 1 tablet (5 mg total) by mouth daily.  30 tablet 3  . meloxicam (MOBIC) 15 MG tablet Take 15 mg by mouth daily.    . metoprolol (LOPRESSOR) 50 MG tablet Take 1 tablet (50 mg total) by mouth 2 (two) times daily. 60 tablet 3  . Multiple Vitamins-Minerals (MULTIVITAMIN WITH MINERALS) tablet Take 1 tablet by mouth daily.    Marland Kitchen oxybutynin (DITROPAN-XL) 5 MG 24 hr tablet Take 5 mg by mouth at bedtime.    . prednisoLONE acetate (PRED FORTE) 1 % ophthalmic suspension Place 1 drop into the left eye daily.    . sildenafil (REVATIO) 20 MG tablet Take 20 mg by mouth daily as  needed.    . simvastatin (ZOCOR) 40 MG tablet Take 20 mg by mouth daily.    . timolol (BETIMOL) 0.5 % ophthalmic solution Place 1 drop into the left eye daily.    . traMADol (ULTRAM) 50 MG tablet Take 1-2 tablets (50-100 mg total) by mouth every 6 (six) hours as needed for moderate pain. 40 tablet 0   No current facility-administered medications for this visit.    Allergies:   Review of patient's allergies indicates no known allergies.    Social History:  The patient  reports that he has quit smoking. He has never used smokeless tobacco. He reports that he drinks alcohol. He reports that he does not use illicit drugs.   Family History:  The patient's family history includes Ovarian cancer in his mother.    ROS:  Please see the history of present illness.   Otherwise, review of systems are positive for none.   All other systems are reviewed and negative.    PHYSICAL EXAM: VS:  BP 160/100 mmHg  Pulse 69  Ht 6\' 2"  (1.88 m)  Wt 84.006 kg (185 lb 3.2 oz)  BMI 23.77 kg/m2 , BMI Body mass index is 23.77 kg/(m^2). GENERAL:  Well appearing HEENT:  Pupils equal round and reactive, fundi not visualized, oral mucosa unremarkable NECK:  No jugular venous distention, waveform within normal limits, carotid upstroke brisk and symmetric, no bruits, no thyromegaly LYMPHATICS:  No cervical adenopathy LUNGS:  Clear to auscultation bilaterally HEART:  RRR.  PMI not displaced or sustained,S1 and S2 within normal limits, no S3, no S4, no clicks, no rubs, II/VI sysotlic murmur at the RUSB. ABD:  Flat, positive bowel sounds normal in frequency in pitch, no bruits, no rebound, no guarding, no midline pulsatile mass, no hepatomegaly, no splenomegaly EXT:  2 plus pulses throughout, no edema, no cyanosis no clubbing SKIN:  No rashes no nodules NEURO:  Cranial nerves II through XII grossly intact, motor grossly intact throughout PSYCH:  Cognitively intact, oriented to person place and time    EKG:  EKG is  ordered today. The ekg ordered today demonstrates sinus rhythm at 69 bpm.  Prior anteroseptal infarct.  LAFB.  Borderline LBBB.   Recent Labs: 10/01/2014: Hemoglobin 9.5*; Platelets 91* 10/03/2014: BUN 18; Creatinine, Ser 0.87; Magnesium 2.1; Potassium 4.2; Sodium 136    Lipid Panel No results found for: CHOL, TRIG, HDL, CHOLHDL, VLDL, LDLCALC, LDLDIRECT    Wt Readings from Last 3 Encounters:  11/01/14 84.006 kg (185 lb 3.2 oz)  10/25/14 83.008 kg (183 lb)  10/04/14 84.732 kg (186 lb 12.8 oz)    Echo 11/01/14: Study Conclusions  - Left ventricle: The cavity size was normal. Wall thickness was increased in a pattern of moderate LVH. Systolic function was normal. The estimated ejection fraction was in the range of 55% to 60%. Wall motion was normal;  there were no regional wall motion abnormalities. - Aortic valve: Normal appearing tissue AVR with no peri valvular regurgitation. - Mitral valve: There was mild regurgitation. - Left atrium: The atrium was mildly dilated. - Atrial septum: No defect or patent foramen ovale was identified.    ASSESSMENT AND PLAN:  # CAD s/p 4 vessel CABG: Mr. Vanaman is doing well.  He has no angina with exertion. - Decrease aspirin to 81mg  daily - Continue simvastatin.  LDL 81. - Continue metoprolol - OK to start cardiac rehab.  # Hypertension: BP poorly-controlled.   - Increase lisinopril to 20mg  daily. - Continue metoprolol.  Consider switching to carvedilol if BP remains high  # S/P AVR: Doing well clinically.  Echo showed no stenosis or regurgitation.  Requires endocarditis prophylaxis prior to dental procedures.  Current medicines are reviewed at length with the patient today.  The patient does not have concerns regarding medicines.  The following changes have been made:  Increase lisinopril to 20mg  and switch aspirin to 81 mg daily.  Labs/ tests ordered today include:  No orders of the defined types were placed in this  encounter.     Disposition:   FU with Hadia Minier C. Oval Linsey, MD in 1 year.    Signed, Sharol Harness, MD  11/01/2014 2:12 PM     [facility]

## 2014-11-10 ENCOUNTER — Encounter: Payer: Self-pay | Admitting: Cardiovascular Disease

## 2014-11-21 ENCOUNTER — Other Ambulatory Visit: Payer: Self-pay | Admitting: Thoracic Surgery (Cardiothoracic Vascular Surgery)

## 2014-11-21 DIAGNOSIS — Z951 Presence of aortocoronary bypass graft: Secondary | ICD-10-CM

## 2014-11-22 ENCOUNTER — Ambulatory Visit (INDEPENDENT_AMBULATORY_CARE_PROVIDER_SITE_OTHER): Payer: Self-pay | Admitting: Thoracic Surgery (Cardiothoracic Vascular Surgery)

## 2014-11-22 ENCOUNTER — Encounter: Payer: Self-pay | Admitting: Thoracic Surgery (Cardiothoracic Vascular Surgery)

## 2014-11-22 ENCOUNTER — Ambulatory Visit
Admission: RE | Admit: 2014-11-22 | Discharge: 2014-11-22 | Disposition: A | Discharge status: Home / Self Care | Payer: Medicare Other | Source: Ambulatory Visit | Attending: Thoracic Surgery (Cardiothoracic Vascular Surgery) | Admitting: Thoracic Surgery (Cardiothoracic Vascular Surgery)

## 2014-11-22 VITALS — BP 150/90 | HR 60 | Resp 20 | Ht 74.0 in | Wt 185.0 lb

## 2014-11-22 DIAGNOSIS — I350 Nonrheumatic aortic (valve) stenosis: Secondary | ICD-10-CM

## 2014-11-22 DIAGNOSIS — Z951 Presence of aortocoronary bypass graft: Secondary | ICD-10-CM

## 2014-11-22 DIAGNOSIS — Z952 Presence of prosthetic heart valve: Secondary | ICD-10-CM

## 2014-11-22 DIAGNOSIS — I2510 Atherosclerotic heart disease of native coronary artery without angina pectoris: Secondary | ICD-10-CM

## 2014-11-22 DIAGNOSIS — Z954 Presence of other heart-valve replacement: Secondary | ICD-10-CM

## 2014-11-22 NOTE — Progress Notes (Signed)
[address]       [place] [address]             [phone]    HPI:  [name] returns for a scheduled follow-up visit  He is a 74 year old man who underwent aortic valve replacement with a pericardial valve and coronary bypass grafting 4 on 09/28/2014. I saw him in the office on 10/25/2014 and he had a small left pleural effusion and possibly some elevation of left hemidiaphragm. I gave him a steroid taper. He now returns for follow-up.  He says he is feeling well. He is not having any shortness of breath or orthopnea. He denies any swelling in his legs. He is not taking pain medication. He does have some cutaneous hypersensitivity over the left anterior chest.  Past Medical History  Diagnosis Date  . Hypertension   . Hyperlipidemia   . Shingles   . GERD (gastroesophageal reflux disease)   . Cataracts, bilateral   . Glaucoma       Current Outpatient Prescriptions  Medication Sig Dispense Refill  . acyclovir (ZOVIRAX) 800 MG tablet Take 400 mg by mouth 2 (two) times daily.    Marland Kitchen aspirin EC 81 MG tablet Take 1 tablet (81 mg total) by mouth daily. 90 tablet 3  . lansoprazole (PREVACID) 30 MG capsule Take 30 mg by mouth daily at 12 noon.    Marland Kitchen lisinopril (PRINIVIL,ZESTRIL) 20 MG tablet Take 1 tablet (20 mg total) by mouth daily. 30 tablet 11  . meloxicam (MOBIC) 15 MG tablet Take 15 mg by mouth daily.    . metoprolol (LOPRESSOR) 50 MG tablet Take 1 tablet (50 mg total) by mouth 2 (two) times daily. 60 tablet 3  . Multiple Vitamins-Minerals (MULTIVITAMIN WITH MINERALS) tablet Take 1 tablet by mouth daily.    Marland Kitchen oxybutynin (DITROPAN-XL) 5 MG 24 hr tablet Take 5 mg by mouth at bedtime.    . prednisoLONE acetate (PRED FORTE) 1 % ophthalmic suspension Place 1 drop into the left eye daily.    . sildenafil (REVATIO) 20 MG tablet Take 20 mg by mouth daily as needed.    . simvastatin (ZOCOR) 40 MG tablet Take 20 mg by mouth daily.    . timolol (BETIMOL) 0.5 %  ophthalmic solution Place 1 drop into the left eye daily.    . traMADol (ULTRAM) 50 MG tablet Take 1-2 tablets (50-100 mg total) by mouth every 6 (six) hours as needed for moderate pain. 40 tablet 0   No current facility-administered medications for this visit.    Physical Exam BP 150/90 mmHg  Pulse 60  Resp 20  Ht 6\' 2"  (1.88 m)  Wt 185 lb (83.915 kg)  BMI 23.74 kg/m2  SpO64 28% 74 year old man in no acute distress Alert and oriented 3 with no focal neurologic deficits Lungs slightly diminished left base, otherwise clear Cardiac regular rate and rhythm normal S1 and S2  Diagnostic Tests: I reviewed his chest x-ray. There has been improvement of aeration in the left base. His x-ray is essentially unchanged from his preoperative film  Impression: 74 year old man who is now about 8 weeks out from aortic valve replacement and coronary bypass grafting. He is doing well at this time. He is having minimal discomfort and is not requiring any narcotic pain medication.  He is anxious to resume full activities. There are no restrictions on him at this time but he was advised to build into new activities gradually.  Plan: I  will be happy to see Mr. Covelli back any time if I can be of any further assistance with his care.  [name], MD [facility] [phone]

## 2014-11-24 ENCOUNTER — Encounter (HOSPITAL_COMMUNITY)
Admission: RE | Admit: 2014-11-24 | Discharge: 2014-11-24 | Disposition: A | Discharge status: Home / Self Care | Payer: Medicare Other | Source: Ambulatory Visit | Attending: Cardiovascular Disease | Admitting: Cardiovascular Disease

## 2014-11-24 DIAGNOSIS — Z951 Presence of aortocoronary bypass graft: Secondary | ICD-10-CM | POA: Insufficient documentation

## 2014-11-24 NOTE — Progress Notes (Signed)
Cardiac Rehab Medication Review by a Pharmacist  Does the patient  feel that his/her medications are working for him/her?  yes  Has the patient been experiencing any side effects to the medications prescribed?  no  Does the patient measure his/her own blood pressure or blood glucose at home?  yes   Does the patient have any problems obtaining medications due to transportation or finances?   no  Understanding of regimen: good Understanding of indications: good Potential of compliance: excellent    Pharmacist comments: I spoke with the patient and his wife. They use a pillbox to help keep track of his medications. They feel as though the medications are working and are  Pleased with the regimen. They have a home BP cuff that they use and document the values at home to take to the cardiologist. I counseled the patient on the administration of his PPI. Neither the patient or his wife had any additional medication therapy questions.    Nobel Brar C. Lennox Grumbles, PharmD Pharmacy Resident  Pager: [phone] 11/24/2014 8:19 AM

## 2014-11-28 ENCOUNTER — Encounter (HOSPITAL_COMMUNITY)
Admission: RE | Admit: 2014-11-28 | Discharge: 2014-11-28 | Disposition: A | Discharge status: Home / Self Care | Payer: Medicare Other | Source: Ambulatory Visit | Attending: Cardiovascular Disease | Admitting: Cardiovascular Disease

## 2014-11-28 ENCOUNTER — Encounter (HOSPITAL_COMMUNITY): Payer: Self-pay

## 2014-11-28 DIAGNOSIS — Z951 Presence of aortocoronary bypass graft: Secondary | ICD-10-CM | POA: Diagnosis present

## 2014-11-28 NOTE — Progress Notes (Signed)
Pt started cardiac rehab today.  Pt tolerated light exercise without difficulty. VSS, telemetry-sinus rhythm, negative QRS, asymptomatic.  Medication list reconciled.  Pt verbalized compliance with medications and denies barriers to compliance. PSYCHOSOCIAL ASSESSMENT:  PHQ-0. Pt exhibits positive coping skills, hopeful outlook with supportive family. No psychosocial needs identified at this time, no psychosocial interventions necessary.    Pt enjoys dancing, line, country and shag.   Pt cardiac rehab  goal is  to increase strength and stamina and get back to work.  Pt encouraged to participate in home exercise in addition to cardiac rehab activities  to increase ability to achieve these goals.  Pt oriented to exercise equipment and routine.  Understanding verbalized.

## 2014-11-30 ENCOUNTER — Encounter (HOSPITAL_COMMUNITY)
Admission: RE | Admit: 2014-11-30 | Discharge: 2014-11-30 | Disposition: A | Discharge status: Home / Self Care | Payer: Medicare Other | Source: Ambulatory Visit | Attending: Cardiovascular Disease | Admitting: Cardiovascular Disease

## 2014-11-30 DIAGNOSIS — Z951 Presence of aortocoronary bypass graft: Secondary | ICD-10-CM | POA: Diagnosis not present

## 2014-12-05 ENCOUNTER — Encounter (HOSPITAL_COMMUNITY)
Admission: RE | Admit: 2014-12-05 | Discharge: 2014-12-05 | Disposition: A | Discharge status: Home / Self Care | Payer: Medicare Other | Source: Ambulatory Visit | Attending: Cardiovascular Disease | Admitting: Cardiovascular Disease

## 2014-12-05 DIAGNOSIS — Z951 Presence of aortocoronary bypass graft: Secondary | ICD-10-CM | POA: Diagnosis not present

## 2014-12-07 ENCOUNTER — Encounter (HOSPITAL_COMMUNITY)
Admission: RE | Admit: 2014-12-07 | Discharge: 2014-12-07 | Disposition: A | Discharge status: Home / Self Care | Payer: Medicare Other | Source: Ambulatory Visit | Attending: Cardiovascular Disease | Admitting: Cardiovascular Disease

## 2014-12-07 DIAGNOSIS — Z951 Presence of aortocoronary bypass graft: Secondary | ICD-10-CM | POA: Diagnosis not present

## 2014-12-07 NOTE — Progress Notes (Signed)
Pt completed Quality of Life survey as a participant in Cardiac Rehab. overall scores are satisfactory.  Pt has positive attitude with good coping skills.  Will continue to monitor.

## 2014-12-07 NOTE — Progress Notes (Signed)
[name] [name] 74 y.o. male Nutrition Note Spoke with pt's wife. Nutrition Survey reviewed. Pt is following Step 1 of the Therapeutic Lifestyle Changes diet. Pt and pt wife have been struggling with dietary changes (e.g. Unsure whether or not food choices are ok and eating out less). Pt expressed understanding of the information reviewed. Pt aware of nutrition education classes offered. Lab Results  Component Value Date   HGBA1C 5.6 09/28/2014   Wt Readings from Last 3 Encounters:  11/24/14 182 lb 1.6 oz (82.6 kg)  11/22/14 185 lb (83.915 kg)  11/01/14 185 lb 3.2 oz (84.006 kg)   Nutrition Diagnosis ? Food-and nutrition-related knowledge deficit related to lack of exposure to information as related to diagnosis of: ? CVD  Nutrition Intervention ? Benefits of adopting Therapeutic Lifestyle Changes discussed when Medficts reviewed. ? Pt to attend the Portion Distortion class ? Pt to attend the  ? Nutrition I class                      ? Nutrition II class ? Pt given handouts for: ? Nutrition I class ? Nutrition II class ? Continue client-centered nutrition education by RD, as part of interdisciplinary care.  Goal(s) ? Pt to identify and limit food sources of saturated fat, trans fat, and cholesterol ? Pt to describe the benefit of including fruits, vegetables, whole grains, and low-fat dairy products in a heart healthy meal plan.  Monitor and Evaluate progress toward nutrition goal with team.  Derek Mound, M.Ed, RD, LDN, CDE 12/07/2014 3:21 PM

## 2014-12-12 ENCOUNTER — Encounter (HOSPITAL_COMMUNITY)
Admission: RE | Admit: 2014-12-12 | Discharge: 2014-12-12 | Disposition: A | Discharge status: Home / Self Care | Payer: Medicare Other | Source: Ambulatory Visit | Attending: Cardiovascular Disease | Admitting: Cardiovascular Disease

## 2014-12-12 DIAGNOSIS — Z951 Presence of aortocoronary bypass graft: Secondary | ICD-10-CM | POA: Diagnosis not present

## 2014-12-14 ENCOUNTER — Encounter (HOSPITAL_COMMUNITY)
Admission: RE | Admit: 2014-12-14 | Discharge: 2014-12-14 | Disposition: A | Discharge status: Home / Self Care | Payer: Medicare Other | Source: Ambulatory Visit | Attending: Cardiovascular Disease | Admitting: Cardiovascular Disease

## 2014-12-14 DIAGNOSIS — Z951 Presence of aortocoronary bypass graft: Secondary | ICD-10-CM | POA: Diagnosis not present

## 2014-12-19 ENCOUNTER — Encounter (HOSPITAL_COMMUNITY)
Admission: RE | Admit: 2014-12-19 | Discharge: 2014-12-19 | Disposition: A | Discharge status: Home / Self Care | Payer: Medicare Other | Source: Ambulatory Visit | Attending: Cardiovascular Disease | Admitting: Cardiovascular Disease

## 2014-12-19 DIAGNOSIS — Z951 Presence of aortocoronary bypass graft: Secondary | ICD-10-CM | POA: Diagnosis not present

## 2014-12-21 ENCOUNTER — Encounter (HOSPITAL_COMMUNITY)
Admission: RE | Admit: 2014-12-21 | Discharge: 2014-12-21 | Disposition: A | Discharge status: Home / Self Care | Payer: Medicare Other | Source: Ambulatory Visit | Attending: Cardiovascular Disease | Admitting: Cardiovascular Disease

## 2014-12-21 DIAGNOSIS — Z951 Presence of aortocoronary bypass graft: Secondary | ICD-10-CM | POA: Diagnosis not present

## 2014-12-26 ENCOUNTER — Encounter (HOSPITAL_COMMUNITY)
Admission: RE | Admit: 2014-12-26 | Discharge: 2014-12-26 | Disposition: A | Discharge status: Home / Self Care | Payer: Medicare Other | Source: Ambulatory Visit | Attending: Cardiovascular Disease | Admitting: Cardiovascular Disease

## 2014-12-26 DIAGNOSIS — Z951 Presence of aortocoronary bypass graft: Secondary | ICD-10-CM | POA: Diagnosis not present

## 2014-12-28 ENCOUNTER — Encounter (HOSPITAL_COMMUNITY)
Admission: RE | Admit: 2014-12-28 | Discharge: 2014-12-28 | Disposition: A | Discharge status: Home / Self Care | Payer: Medicare Other | Source: Ambulatory Visit | Attending: Cardiovascular Disease | Admitting: Cardiovascular Disease

## 2014-12-28 DIAGNOSIS — Z951 Presence of aortocoronary bypass graft: Secondary | ICD-10-CM | POA: Diagnosis not present

## 2015-01-02 ENCOUNTER — Encounter (HOSPITAL_COMMUNITY)
Admission: RE | Admit: 2015-01-02 | Discharge: 2015-01-02 | Disposition: A | Discharge status: Home / Self Care | Payer: Medicare Other | Source: Ambulatory Visit | Attending: Cardiovascular Disease | Admitting: Cardiovascular Disease

## 2015-01-02 DIAGNOSIS — Z951 Presence of aortocoronary bypass graft: Secondary | ICD-10-CM | POA: Diagnosis not present

## 2015-01-04 ENCOUNTER — Encounter (HOSPITAL_COMMUNITY)
Admission: RE | Admit: 2015-01-04 | Discharge: 2015-01-04 | Disposition: A | Discharge status: Home / Self Care | Payer: Medicare Other | Source: Ambulatory Visit | Attending: Cardiovascular Disease | Admitting: Cardiovascular Disease

## 2015-01-04 DIAGNOSIS — Z951 Presence of aortocoronary bypass graft: Secondary | ICD-10-CM | POA: Diagnosis not present

## 2015-01-09 ENCOUNTER — Encounter (HOSPITAL_COMMUNITY)
Admission: RE | Admit: 2015-01-09 | Discharge: 2015-01-09 | Disposition: A | Discharge status: Home / Self Care | Payer: Medicare Other | Source: Ambulatory Visit | Attending: Cardiovascular Disease | Admitting: Cardiovascular Disease

## 2015-01-09 DIAGNOSIS — Z951 Presence of aortocoronary bypass graft: Secondary | ICD-10-CM | POA: Diagnosis not present

## 2015-01-11 ENCOUNTER — Encounter (HOSPITAL_COMMUNITY)
Admission: RE | Admit: 2015-01-11 | Discharge: 2015-01-11 | Disposition: A | Discharge status: Home / Self Care | Payer: Medicare Other | Source: Ambulatory Visit | Attending: Cardiovascular Disease | Admitting: Cardiovascular Disease

## 2015-01-11 DIAGNOSIS — Z951 Presence of aortocoronary bypass graft: Secondary | ICD-10-CM | POA: Diagnosis not present

## 2015-01-18 ENCOUNTER — Encounter (HOSPITAL_COMMUNITY)
Admission: RE | Admit: 2015-01-18 | Discharge: 2015-01-18 | Disposition: A | Discharge status: Home / Self Care | Payer: Medicare Other | Source: Ambulatory Visit | Attending: Cardiovascular Disease | Admitting: Cardiovascular Disease

## 2015-01-18 DIAGNOSIS — Z951 Presence of aortocoronary bypass graft: Secondary | ICD-10-CM | POA: Diagnosis not present

## 2015-01-23 ENCOUNTER — Encounter (HOSPITAL_COMMUNITY): Payer: Medicare Other

## 2015-01-23 DIAGNOSIS — Z951 Presence of aortocoronary bypass graft: Secondary | ICD-10-CM | POA: Insufficient documentation

## 2015-01-25 ENCOUNTER — Encounter (HOSPITAL_COMMUNITY)
Admission: RE | Admit: 2015-01-25 | Discharge: 2015-01-25 | Disposition: A | Discharge status: Home / Self Care | Payer: Medicare Other | Source: Ambulatory Visit | Attending: Cardiovascular Disease | Admitting: Cardiovascular Disease

## 2015-01-25 DIAGNOSIS — Z951 Presence of aortocoronary bypass graft: Secondary | ICD-10-CM | POA: Diagnosis not present

## 2015-01-30 ENCOUNTER — Encounter (HOSPITAL_COMMUNITY)
Admission: RE | Admit: 2015-01-30 | Payer: Medicare Other | Source: Ambulatory Visit | Attending: Cardiovascular Disease | Admitting: Cardiovascular Disease

## 2015-01-30 ENCOUNTER — Telehealth (HOSPITAL_COMMUNITY): Payer: Self-pay | Admitting: *Deleted

## 2015-02-01 ENCOUNTER — Encounter (HOSPITAL_COMMUNITY): Payer: Medicare Other

## 2015-02-01 ENCOUNTER — Telehealth (HOSPITAL_COMMUNITY): Payer: Self-pay | Admitting: Family Medicine

## 2015-02-02 ENCOUNTER — Other Ambulatory Visit: Payer: Self-pay | Admitting: Cardiovascular Disease

## 2015-02-02 NOTE — Telephone Encounter (Signed)
°*  STAT* If patient is at the pharmacy, call can be transferred to refill team.   1. Which medications need to be refilled? (please list name of each medication and dose if known) Metoprolol -please call today,pt is out of his medicine  2. Which pharmacy/location (including street and city if local pharmacy) is medication to be sent to?[place] [phone]  3. Do they need a 30 day or 90 day supply? 60 and refills

## 2015-02-03 MED ORDER — METOPROLOL TARTRATE 50 MG PO TABS: 50.0000 mg | ORAL_TABLET | Freq: Two times a day (BID) | ORAL | Status: DC

## 2015-02-03 NOTE — Addendum Note (Signed)
Addended by: Diana Eves on: 02/03/2015 02:33 PM   Modules accepted: Orders

## 2015-02-03 NOTE — Telephone Encounter (Signed)
Rx(s) sent to pharmacy electronically.  

## 2015-02-06 ENCOUNTER — Encounter (HOSPITAL_COMMUNITY)
Admission: RE | Admit: 2015-02-06 | Discharge: 2015-02-06 | Disposition: A | Discharge status: Home / Self Care | Payer: Medicare Other | Source: Ambulatory Visit | Attending: Cardiovascular Disease | Admitting: Cardiovascular Disease

## 2015-02-06 DIAGNOSIS — Z951 Presence of aortocoronary bypass graft: Secondary | ICD-10-CM | POA: Diagnosis not present

## 2015-02-06 NOTE — Progress Notes (Signed)
[name] [name] 75 y.o. male Nutrition Note Spoke with pt and pt's wife. Nutrition Plan and Nutrition Survey reviewed. Per discussion, pt appears to be following Step 2 of the Therapeutic Lifestyle Changes diet. Pt and pt wife have been doing much better adjusting to dietary changes. Pt expressed understanding of the information reviewed. Pt aware of nutrition education classes offered.  Nutrition Diagnosis ? Food-and nutrition-related knowledge deficit related to lack of exposure to information as related to diagnosis of: ? CVD  Nutrition Intervention ? Pt's individual nutrition plan reviewed with pt. ? Benefits of adopting Therapeutic Lifestyle Changes discussed when Medficts reviewed. ? Pt to attend the Portion Distortion class ? Continue client-centered nutrition education by RD, as part of interdisciplinary care.  Goal(s) ? Pt to identify and limit food sources of saturated fat, trans fat, and cholesterol - met ? Pt to describe the benefit of including fruits, vegetables, whole grains, and low-fat dairy products in a heart healthy meal plan. - met  Monitor and Evaluate progress toward nutrition goal with team.  Derek Mound, M.Ed, RD, LDN, CDE 02/06/2015 2:16 PM

## 2015-02-08 ENCOUNTER — Encounter (HOSPITAL_COMMUNITY): Payer: Medicare Other

## 2015-02-10 ENCOUNTER — Encounter (HOSPITAL_COMMUNITY)
Admission: RE | Admit: 2015-02-10 | Discharge: 2015-02-10 | Disposition: A | Discharge status: Home / Self Care | Payer: Medicare Other | Source: Ambulatory Visit | Attending: Cardiovascular Disease | Admitting: Cardiovascular Disease

## 2015-02-10 DIAGNOSIS — Z951 Presence of aortocoronary bypass graft: Secondary | ICD-10-CM | POA: Diagnosis not present

## 2015-02-13 ENCOUNTER — Encounter (HOSPITAL_COMMUNITY)
Admission: RE | Admit: 2015-02-13 | Discharge: 2015-02-13 | Disposition: A | Discharge status: Home / Self Care | Payer: Medicare Other | Source: Ambulatory Visit | Attending: Cardiovascular Disease | Admitting: Cardiovascular Disease

## 2015-02-13 DIAGNOSIS — Z951 Presence of aortocoronary bypass graft: Secondary | ICD-10-CM | POA: Diagnosis not present

## 2015-02-15 ENCOUNTER — Encounter (HOSPITAL_COMMUNITY)
Admission: RE | Admit: 2015-02-15 | Discharge: 2015-02-15 | Disposition: A | Discharge status: Home / Self Care | Payer: Medicare Other | Source: Ambulatory Visit | Attending: Cardiovascular Disease | Admitting: Cardiovascular Disease

## 2015-02-15 DIAGNOSIS — Z951 Presence of aortocoronary bypass graft: Secondary | ICD-10-CM | POA: Diagnosis not present

## 2015-02-20 ENCOUNTER — Encounter (HOSPITAL_COMMUNITY)
Admission: RE | Admit: 2015-02-20 | Discharge: 2015-02-20 | Disposition: A | Discharge status: Home / Self Care | Payer: Medicare Other | Source: Ambulatory Visit | Attending: Cardiovascular Disease | Admitting: Cardiovascular Disease

## 2015-02-20 DIAGNOSIS — Z951 Presence of aortocoronary bypass graft: Secondary | ICD-10-CM | POA: Diagnosis not present

## 2015-02-22 ENCOUNTER — Encounter (HOSPITAL_COMMUNITY)
Admission: RE | Admit: 2015-02-22 | Discharge: 2015-02-22 | Disposition: A | Discharge status: Home / Self Care | Payer: Medicare Other | Source: Ambulatory Visit | Attending: Cardiovascular Disease | Admitting: Cardiovascular Disease

## 2015-02-22 ENCOUNTER — Encounter (HOSPITAL_COMMUNITY): Payer: Medicare Other

## 2015-02-22 DIAGNOSIS — Z951 Presence of aortocoronary bypass graft: Secondary | ICD-10-CM | POA: Diagnosis present

## 2015-02-27 ENCOUNTER — Encounter (HOSPITAL_COMMUNITY)
Admission: RE | Admit: 2015-02-27 | Discharge: 2015-02-27 | Disposition: A | Discharge status: Home / Self Care | Payer: Medicare Other | Source: Ambulatory Visit | Attending: Cardiovascular Disease | Admitting: Cardiovascular Disease

## 2015-02-27 DIAGNOSIS — Z951 Presence of aortocoronary bypass graft: Secondary | ICD-10-CM | POA: Diagnosis not present

## 2015-03-01 ENCOUNTER — Encounter (HOSPITAL_COMMUNITY)
Admission: RE | Admit: 2015-03-01 | Discharge: 2015-03-01 | Disposition: A | Discharge status: Home / Self Care | Payer: Medicare Other | Source: Ambulatory Visit | Attending: Cardiovascular Disease | Admitting: Cardiovascular Disease

## 2015-03-01 ENCOUNTER — Encounter (HOSPITAL_COMMUNITY): Payer: Self-pay

## 2015-03-01 ENCOUNTER — Encounter (HOSPITAL_COMMUNITY): Payer: Medicare Other

## 2015-03-01 DIAGNOSIS — Z951 Presence of aortocoronary bypass graft: Secondary | ICD-10-CM | POA: Diagnosis not present

## 2015-03-01 NOTE — Progress Notes (Signed)
Pt graduated from cardiac rehab program today with completion of 24 exercise sessions in Phase II.   Pt exited early due to high insurance copay.  Pt maintained good attendance and progressed nicely during his participation in rehab as evidenced by increased MET level.   Medication list reconciled. Repeat  PHQ score-  0.  Pt has made significant lifestyle changes and should be commended for his success. Pt feels he has achieved his goals during cardiac rehab, which include increased strength and stamina to be able to return to line dancing.  Pt plans to continue exercising on his own walking at home.

## 2015-03-06 ENCOUNTER — Encounter (HOSPITAL_COMMUNITY): Payer: Medicare Other

## 2015-03-08 ENCOUNTER — Encounter (HOSPITAL_COMMUNITY): Payer: Medicare Other

## 2015-03-13 ENCOUNTER — Encounter: Payer: Self-pay | Admitting: Cardiovascular Disease

## 2015-03-13 ENCOUNTER — Ambulatory Visit (INDEPENDENT_AMBULATORY_CARE_PROVIDER_SITE_OTHER): Payer: Medicare Other | Admitting: Cardiovascular Disease

## 2015-03-13 ENCOUNTER — Encounter (HOSPITAL_COMMUNITY): Payer: Medicare Other

## 2015-03-13 VITALS — BP 154/90 | HR 56 | Ht 71.75 in | Wt 174.0 lb

## 2015-03-13 DIAGNOSIS — I2510 Atherosclerotic heart disease of native coronary artery without angina pectoris: Secondary | ICD-10-CM | POA: Diagnosis not present

## 2015-03-13 DIAGNOSIS — E785 Hyperlipidemia, unspecified: Secondary | ICD-10-CM | POA: Diagnosis not present

## 2015-03-13 DIAGNOSIS — I2583 Coronary atherosclerosis due to lipid rich plaque: Secondary | ICD-10-CM

## 2015-03-13 DIAGNOSIS — I10 Essential (primary) hypertension: Secondary | ICD-10-CM

## 2015-03-13 MED ORDER — CARVEDILOL 12.5 MG PO TABS: 12.5000 mg | ORAL_TABLET | Freq: Two times a day (BID) | ORAL | Status: DC

## 2015-03-13 NOTE — Patient Instructions (Signed)
STOP METOPROLOL TARTRATE   START CARVEDILOL 12.5 MG ONE TABLET TWICE A DAY.   Cleared for colonoscopy - MAY HOLD ASPIRIN 5 DAYS   Your physician recommends that you schedule a follow-up appointment in 2 weeks with Dr Oval Linsey or Erasmo Downer.

## 2015-03-13 NOTE — Progress Notes (Signed)
8   Cardiology Office Note   Date:  03/13/2015   ID:  [name], [name] [name] 12, 1942, MRN [id number]  PCP:  [name], MD  Cardiologist:   Sharol Harness, MD   Chief Complaint  Patient presents with  . Hypertension    And low heart rate    Patient ID: [name] [name] is a 75 y.o. male with hypertension, CAD s/p CABG (LIMA-->LAD, SVG-->RI/OM1, SVG_>PDA), bradycardia, and syncope who presents for follow up s/p CABG and AVR.    Interval History 03/13/15: Since his last appointment [name] [name] followed up with [name].  At that appointment he was doing well and from clinic.since then he has participated in cardiac rehabilitation and was dismissed after 24 sessions He stopped early due to high insurance co-pays.   He checks his blood pressure daily and it has from 118-157/59-86.His heart rate is consistently in the 50s. He denies any lightheadedness or dizziness. He notes that even after exercising his heart rate is still in the 50s.  He denies any chest pain or shortness of breath with exertion. Mr. Sharer saw his PCP on 217.  At that appointment his lisinopril was increased to 40 mg daily.  However, his blood pressure continues to be elevated despite making that change.  He reports taking all medications as prescribed.  Mr. Vandruff continues to walk almost daily for exercise.    History of Present Illness 11/01/14: [name] [name] underwent 4 vessel CABG and AVR (25 mm [facility] Ease pericardial tissue valve) on 09/28/14.  His post-operative course was complicated by atrial fibrillation/flutter, but he was discharged in sinus rhythm.  Since discharge [name] [name] has been doing well. He has [place]. He denies any chest pain or shortness of breath with this activity. He also denies any lower extremity edema, orthopnea or PND.  He has not noted any palpitations, though he did not know when he was in atrial fibrillation during the hospitalization. His only  complaint is that he has some changes in his voice and hoarseness after talking for prolonged periods of time. He attributes this to having been intubated.  He is awaiting the results of this appointment before starting cardiac rehabilitation.  [name] [name] followed up with [name] on 10/4 and was doing well from a surgical perspective.  [name] [name] checks his blood pressure at home. It typically runs in the 120s over 80s. In the past he took lisinopril 20 mg daily, though this was decreased to 5 mg at discharge. He had one episode of syncope 2 years ago. At that time one of his antihypertensives were stopped but later started back on his PCP due to hypertension.   Past Medical History  Diagnosis Date  . Hypertension   . Hyperlipidemia   . Shingles   . GERD (gastroesophageal reflux disease)   . Cataracts, bilateral   . Glaucoma     Past Surgical History  Procedure Laterality Date  . Cataract extraction    . Prostatectomy    . Cardiac catheterization N/A 09/23/2014    Procedure: Left Heart Cath and Coronary Angiography;  Surgeon: [name], MD;  Location: [place] [facility];  Service: Cardiovascular;  Laterality: N/A;  . Coronary artery bypass graft N/A 09/28/2014    Procedure: CORONARY ARTERY BYPASS GRAFTING (CABG) x4 using left internal mammory artery and right saphenous leg vein harvested endoscopically.;  Surgeon: [name], MD;  Location: [place];  Service: Open Heart Surgery;  Laterality: N/A;  . Aortic valve replacement N/A 09/28/2014    Procedure: AORTIC VALVE REPLACEMENT (AVR);  Surgeon: [name], MD;  Location: [place];  Service: Open Heart Surgery;  Laterality: N/A;  . Tee without cardioversion N/A 09/28/2014    Procedure: TRANSESOPHAGEAL ECHOCARDIOGRAM (TEE);  Surgeon: [name], MD;  Location: [place];  Service: Open Heart Surgery;  Laterality: N/A;     Current Outpatient Prescriptions  Medication Sig Dispense Refill  . acetaminophen (TYLENOL)  500 MG tablet Take 500 mg by mouth every 6 (six) hours as needed.    Marland Kitchen acyclovir (ZOVIRAX) 800 MG tablet Take 400 mg by mouth 2 (two) times daily.    Marland Kitchen aspirin EC 81 MG tablet Take 1 tablet (81 mg total) by mouth daily. 90 tablet 3  . esomeprazole (NEXIUM) 20 MG capsule Take 20 mg by mouth daily before breakfast. Reported on 03/01/2015    . lisinopril (PRINIVIL,ZESTRIL) 40 MG tablet 40 mg daily.  0  . meloxicam (MOBIC) 15 MG tablet Take 15 mg by mouth daily.    . Multiple Vitamins-Minerals (MULTIVITAMIN WITH MINERALS) tablet Take 1 tablet by mouth daily.    Marland Kitchen oxybutynin (DITROPAN-XL) 5 MG 24 hr tablet Take 5 mg by mouth at bedtime.    . prednisoLONE acetate (PRED FORTE) 1 % ophthalmic suspension Place 1 drop into the left eye daily.    . sildenafil (REVATIO) 20 MG tablet Take 20 mg by mouth daily as needed.    . simvastatin (ZOCOR) 40 MG tablet Take 20 mg by mouth daily.    . timolol (BETIMOL) 0.5 % ophthalmic solution Place 1 drop into the left eye daily.    . carvedilol (COREG) 12.5 MG tablet Take 1 tablet (12.5 mg total) by mouth 2 (two) times daily. 60 tablet 3   No current facility-administered medications for this visit.    Allergies:   Hydrochlorothiazide    Social History:  The patient  reports that he has quit smoking. He has never used smokeless tobacco. He reports that he drinks alcohol. He reports that he does not use illicit drugs.   Family History:  The patient's family history includes Ovarian cancer in his mother.    ROS:  Please see the history of present illness.   Otherwise, review of systems are positive for none.   All other systems are reviewed and negative.    PHYSICAL EXAM: VS:  BP 154/90 mmHg  Pulse 56  Ht 5' 11.75" (1.822 m)  Wt 78.926 kg (174 lb)  BMI 23.78 kg/m2 , BMI Body mass index is 23.78 kg/(m^2). GENERAL:  Well appearing HEENT:  Pupils equal round and reactive, fundi not visualized, oral mucosa unremarkable NECK:  No jugular venous distention,  waveform within normal limits, carotid upstroke brisk and symmetric, no bruits, no thyromegaly LYMPHATICS:  No cervical adenopathy LUNGS:  Clear to auscultation bilaterally HEART:  RRR.  PMI not displaced or sustained,S1 and S2 within normal limits, no S3, no S4, no clicks, no rubs, II/VI sysotlic murmur at the RUSB. ABD:  Flat, positive bowel sounds normal in frequency in pitch, no bruits, no rebound, no guarding, no midline pulsatile mass, no hepatomegaly, no splenomegaly EXT:  2 plus pulses throughout, no edema, no cyanosis no clubbing SKIN:  No rashes no nodules NEURO:  Cranial nerves II through XII grossly intact, motor grossly intact throughout PSYCH:  Cognitively intact, oriented to person place and time   EKG:  EKG is not ordered today.   Recent Labs:  10/01/2014: Hemoglobin 9.5*; Platelets 91* 10/03/2014: BUN 18; Creatinine, Ser 0.87; Magnesium 2.1; Potassium 4.2; Sodium 136    Lipid Panel No results found for: CHOL, TRIG, HDL, CHOLHDL, VLDL, LDLCALC, LDLDIRECT    Wt Readings from Last 3 Encounters:  03/13/15 78.926 kg (174 lb)  11/24/14 82.6 kg (182 lb 1.6 oz)  11/22/14 83.915 kg (185 lb)    Echo 11/01/14: Study Conclusions  - Left ventricle: The cavity size was normal. Wall thickness was increased in a pattern of moderate LVH. Systolic function was normal. The estimated ejection fraction was in the range of 55% to 60%. Wall motion was normal; there were no regional wall motion abnormalities. - Aortic valve: Normal appearing tissue AVR with no peri valvular regurgitation. - Mitral valve: There was mild regurgitation. - Left atrium: The atrium was mildly dilated. - Atrial septum: No defect or patent foramen ovale was identified.    ASSESSMENT AND PLAN:  # Hypertension: BP remains poorly-controlled.  Continue lisinopril 40 mg daily and switch metoprolol to carvedilol 12.5mg  bid.  This should help with both his hypertension and bradycardia.  # CAD s/p 4  vessel CABG: Mr. Ries continues to do well.  He has no angina with exertion.  Continue aspirin and simvastatin.  Switch metoprolol to carvedilol as above.  # S/P AVR: Doing well clinically.  Echo showed no stenosis or regurgitation.  Requires endocarditis prophylaxis prior to dental procedures.  Current medicines are reviewed at length with the patient today.  The patient does not have concerns regarding medicines.  The following changes have been made:  Stop metoprolol and start carvedilol 12.5 mg bid.  Labs/ tests ordered today include:  No orders of the defined types were placed in this encounter.     Disposition:   FU with Mirelle Biskup C. Oval Linsey, MD in 2 weeks.    Signed, Sharol Harness, MD  03/13/2015 4:46 PM    [place]

## 2015-03-15 ENCOUNTER — Encounter (HOSPITAL_COMMUNITY): Payer: Medicare Other

## 2015-03-17 ENCOUNTER — Encounter: Payer: Self-pay | Admitting: Cardiovascular Disease

## 2015-03-20 ENCOUNTER — Encounter (HOSPITAL_COMMUNITY): Payer: Medicare Other

## 2015-03-22 ENCOUNTER — Encounter (HOSPITAL_COMMUNITY): Payer: Medicare Other

## 2015-03-22 ENCOUNTER — Encounter (HOSPITAL_COMMUNITY): Admission: RE | Admit: 2015-03-22 | Payer: Medicare Other | Source: Ambulatory Visit

## 2015-03-27 ENCOUNTER — Encounter (HOSPITAL_COMMUNITY): Payer: Medicare Other

## 2015-03-29 ENCOUNTER — Encounter (HOSPITAL_COMMUNITY): Payer: Medicare Other

## 2015-03-31 ENCOUNTER — Encounter: Payer: Self-pay | Admitting: Cardiovascular Disease

## 2015-03-31 ENCOUNTER — Ambulatory Visit (INDEPENDENT_AMBULATORY_CARE_PROVIDER_SITE_OTHER): Payer: Medicare Other | Admitting: Cardiovascular Disease

## 2015-03-31 VITALS — BP 152/84 | HR 60 | Ht 73.0 in | Wt 173.9 lb

## 2015-03-31 DIAGNOSIS — I868 Varicose veins of other specified sites: Secondary | ICD-10-CM | POA: Diagnosis not present

## 2015-03-31 DIAGNOSIS — I119 Hypertensive heart disease without heart failure: Secondary | ICD-10-CM

## 2015-03-31 DIAGNOSIS — I8390 Asymptomatic varicose veins of unspecified lower extremity: Secondary | ICD-10-CM

## 2015-03-31 HISTORY — DX: Asymptomatic varicose veins of unspecified lower extremity: I83.90

## 2015-03-31 MED ORDER — AMLODIPINE BESYLATE 2.5 MG PO TABS: 2.5000 mg | ORAL_TABLET | Freq: Every day | ORAL | Status: DC

## 2015-03-31 NOTE — Patient Instructions (Addendum)
Medication Instructions:  START AMLODIPINE 2.5 MG ONE DAILY   Labwork: NONE  Testing/Procedures: NONE  Follow-Up: Your physician recommends that you schedule a follow-up appointment in: [place]  Will arrange for you to see [facility] for your varicose veins   If you need a refill on your cardiac medications before your next appointment, please call your pharmacy.

## 2015-03-31 NOTE — Progress Notes (Signed)
8   Cardiology Office Note   Date:  03/31/2015   ID:  JENARD WILLIMAS, DOB May 18, 1940, MRN [id number]  PCP:  [name], MD  Cardiologist:   Sharol Harness, MD   Chief Complaint  Patient presents with  . Follow-up    pt states no chest pain no SOB no light headedness or dizziness no edema    Patient ID: JORIS ARRUE is a 75 y.o. male with hypertension, CAD s/p CABG (LIMA-->LAD, SVG-->RI/OM1, SVG_>PDA), bradycardia, and syncope who presents for follow up s/p CABG and AVR.    Interval History 03/31/15: Mr. Lamia is doing well.  BP has ranged from 110-145/70s-80s.  He is otherwise well and without complaint.  He denies chest pain or shortness of breath.  He has noted discomfort from varicose veins in his left lower leg.  He denies swelling in his lower legs, orthopnea or PND.  His leg is tender and it is worse after standing for prolonged periods of time.  Interval History 03/13/15: Since his last appointment Mr. Levengood followed up with [name].  At that appointment he was doing well and from clinic.since then he has participated in cardiac rehabilitation and was dismissed after 24 sessions He stopped early due to high insurance co-pays.   He checks his blood pressure daily and it has from 118-157/59-86.His heart rate is consistently in the 50s. He denies any lightheadedness or dizziness. He notes that even after exercising his heart rate is still in the 50s.  He denies any chest pain or shortness of breath with exertion. Mr. Kenner saw his PCP on 217.  At that appointment his lisinopril was increased to 40 mg daily.  However, his blood pressure continues to be elevated despite making that change.  He reports taking all medications as prescribed.  Mr. Heitzmann continues to walk almost daily for exercise.     History of Present Illness 11/01/14: Mr. Vandecar underwent 4 vessel CABG and AVR (25 mm [facility] Ease pericardial tissue valve) on 09/28/14.  His post-operative course was  complicated by atrial fibrillation/flutter, but he was discharged in sinus rhythm.  Since discharge Mr. Galin has been doing well. He has [place]. He denies any chest pain or shortness of breath with this activity. He also denies any lower extremity edema, orthopnea or PND.  He has not noted any palpitations, though he did not know when he was in atrial fibrillation during the hospitalization. His only complaint is that he has some changes in his voice and hoarseness after talking for prolonged periods of time. He attributes this to having been intubated.  He is awaiting the results of this appointment before starting cardiac rehabilitation.  Mr. Gelhar followed up with [name] on 10/4 and was doing well from a surgical perspective.  Mr. Jouett checks his blood pressure at home. It typically runs in the 120s over 80s. In the past he took lisinopril 20 mg daily, though this was decreased to 5 mg at discharge. He had one episode of syncope 2 years ago. At that time one of his antihypertensives were stopped but later started back on his PCP due to hypertension.   Past Medical History  Diagnosis Date  . Hypertension   . Hyperlipidemia   . Shingles   . GERD (gastroesophageal reflux disease)   . Cataracts, bilateral   . Glaucoma   . Varicose veins 03/31/2015    Past Surgical History  Procedure Laterality Date  . Cataract  extraction    . Prostatectomy    . Cardiac catheterization N/A 09/23/2014    Procedure: Left Heart Cath and Coronary Angiography;  Surgeon: [name], MD;  Location: [place] [facility];  Service: Cardiovascular;  Laterality: N/A;  . Coronary artery bypass graft N/A 09/28/2014    Procedure: CORONARY ARTERY BYPASS GRAFTING (CABG) x4 using left internal mammory artery and right saphenous leg vein harvested endoscopically.;  Surgeon: [name], MD;  Location: [place];  Service: Open Heart Surgery;  Laterality: N/A;  . Aortic valve  replacement N/A 09/28/2014    Procedure: AORTIC VALVE REPLACEMENT (AVR);  Surgeon: [name], MD;  Location: [place];  Service: Open Heart Surgery;  Laterality: N/A;  . Tee without cardioversion N/A 09/28/2014    Procedure: TRANSESOPHAGEAL ECHOCARDIOGRAM (TEE);  Surgeon: [name], MD;  Location: [place];  Service: Open Heart Surgery;  Laterality: N/A;     Current Outpatient Prescriptions  Medication Sig Dispense Refill  . acetaminophen (TYLENOL) 500 MG tablet Take 500 mg by mouth every 6 (six) hours as needed.    Marland Kitchen acyclovir (ZOVIRAX) 800 MG tablet Take 400 mg by mouth 2 (two) times daily.    Marland Kitchen aspirin EC 81 MG tablet Take 1 tablet (81 mg total) by mouth daily. 90 tablet 3  . carvedilol (COREG) 12.5 MG tablet Take 1 tablet (12.5 mg total) by mouth 2 (two) times daily. 60 tablet 3  . esomeprazole (NEXIUM) 20 MG capsule Take 20 mg by mouth daily before breakfast. Reported on 03/01/2015    . lisinopril (PRINIVIL,ZESTRIL) 40 MG tablet 40 mg daily.  0  . meloxicam (MOBIC) 15 MG tablet Take 15 mg by mouth daily.    . Multiple Vitamins-Minerals (MULTIVITAMIN WITH MINERALS) tablet Take 1 tablet by mouth daily.    Marland Kitchen oxybutynin (DITROPAN-XL) 5 MG 24 hr tablet Take 5 mg by mouth at bedtime.    . prednisoLONE acetate (PRED FORTE) 1 % ophthalmic suspension Place 1 drop into the left eye daily.    . sildenafil (REVATIO) 20 MG tablet Take 20 mg by mouth daily as needed.    . simvastatin (ZOCOR) 40 MG tablet Take 20 mg by mouth daily.    . timolol (BETIMOL) 0.5 % ophthalmic solution Place 2 drops into the left eye daily.     Marland Kitchen amLODipine (NORVASC) 2.5 MG tablet Take 1 tablet (2.5 mg total) by mouth daily. 30 tablet 5   No current facility-administered medications for this visit.    Allergies:   Hydrochlorothiazide    Social History:  The patient  reports that he has quit smoking. He has never used smokeless tobacco. He reports that he drinks alcohol. He reports that he does not use illicit  drugs.   Family History:  The patient's family history includes Ovarian cancer in his mother.    ROS:  Please see the history of present illness.   Otherwise, review of systems are positive for none.   All other systems are reviewed and negative.    PHYSICAL EXAM: VS:  BP 152/84 mmHg  Pulse 60  Ht 6\' 1"  (1.854 m)  Wt 78.881 kg (173 lb 14.4 oz)  BMI 22.95 kg/m2 , BMI Body mass index is 22.95 kg/(m^2). GENERAL:  Well appearing HEENT:  Pupils equal round and reactive, fundi not visualized, oral mucosa unremarkable NECK:  No jugular venous distention, waveform within normal limits, carotid upstroke brisk and symmetric, no bruits, no thyromegaly LYMPHATICS:  No cervical adenopathy LUNGS:  Clear  to auscultation bilaterally HEART:  RRR.  PMI not displaced or sustained,S1 and S2 within normal limits, no S3, no S4, no clicks, no rubs, II/VI sysotlic murmur at the RUSB. ABD:  Flat, positive bowel sounds normal in frequency in pitch, no bruits, no rebound, no guarding, no midline pulsatile mass, no hepatomegaly, no splenomegaly EXT:  2 plus pulses throughout, no edema, no cyanosis no clubbing.  Mildly tender, soft varicose veins on the left lower extremity. SKIN:  No rashes no nodules NEURO:  Cranial nerves II through XII grossly intact, motor grossly intact throughout PSYCH:  Cognitively intact, oriented to person place and time  EKG:  EKG is not ordered today.   Recent Labs: 10/01/2014: Hemoglobin 9.5*; Platelets 91* 10/03/2014: BUN 18; Creatinine, Ser 0.87; Magnesium 2.1; Potassium 4.2; Sodium 136    Lipid Panel No results found for: CHOL, TRIG, HDL, CHOLHDL, VLDL, LDLCALC, LDLDIRECT    Wt Readings from Last 3 Encounters:  03/31/15 78.881 kg (173 lb 14.4 oz)  03/13/15 78.926 kg (174 lb)  11/24/14 82.6 kg (182 lb 1.6 oz)    Echo 11/01/14: Study Conclusions  - Left ventricle: The cavity size was normal. Wall thickness was increased in a pattern of moderate LVH. Systolic function  was normal. The estimated ejection fraction was in the range of 55% to 60%. Wall motion was normal; there were no regional wall motion abnormalities. - Aortic valve: Normal appearing tissue AVR with no peri valvular regurgitation. - Mitral valve: There was mild regurgitation. - Left atrium: The atrium was mildly dilated. - Atrial septum: No defect or patent foramen ovale was identified.  ASSESSMENT AND PLAN:  # Hypertensive heart disease: BP remains poorly-controlled.  Continue lisinopril 40 mg daily and carvedilol 12.5mg  bid.  We will start amlodipine 2.5 mg daily.  He will continue to monitor his BP and call if it is >140/90.  # Varicose vein: Mr. Kenworthy has varicose veins in the leg that do not appear to be thrombosed but do cause discomfort at swelling.  I recommended compression stockings and will refer him to [facility] for further evaluation and care.  Current medicines are reviewed at length with the patient today.  The patient does not have concerns regarding medicines.  The following changes have been made:  Add amlodipine 2.5 mg daily.  Labs/ tests ordered today include:   Orders Placed This Encounter  Procedures  . Ambulatory referral to Vascular Surgery     Disposition:   FU with Mira Balon C. Oval Linsey, MD in 2 weeks.    Signed, Sharol Harness, MD  03/31/2015 3:05 PM    [place]

## 2015-04-05 ENCOUNTER — Encounter (HOSPITAL_COMMUNITY): Payer: Medicare Other

## 2015-04-10 ENCOUNTER — Other Ambulatory Visit: Payer: Self-pay | Admitting: *Deleted

## 2015-04-10 DIAGNOSIS — I8390 Asymptomatic varicose veins of unspecified lower extremity: Secondary | ICD-10-CM

## 2015-04-11 ENCOUNTER — Other Ambulatory Visit: Payer: Self-pay | Admitting: Gastroenterology

## 2015-04-12 ENCOUNTER — Encounter (HOSPITAL_COMMUNITY): Payer: Medicare Other

## 2015-04-12 NOTE — Progress Notes (Signed)
Reviewed home exercise with pt today.  Pt plans to walk  for exercise.  Reviewed THR, pulse, RPE, sign and symptoms, and when to call 911 or MD.  Also discussed weather considerations and indoor options.  Pt voiced understanding.    Cadey Bazile,MS,ACSM RCEP 

## 2015-04-12 NOTE — Addendum Note (Signed)
Encounter addended by: Codey Burling D Danie Diehl on: 04/12/2015  4:01 PM<BR>     Documentation filed: Clinical Notes

## 2015-04-19 ENCOUNTER — Encounter (HOSPITAL_COMMUNITY): Payer: Medicare Other

## 2015-04-26 ENCOUNTER — Encounter (HOSPITAL_COMMUNITY): Payer: Medicare Other

## 2015-05-03 ENCOUNTER — Encounter (HOSPITAL_COMMUNITY): Payer: Medicare Other

## 2015-05-10 ENCOUNTER — Encounter (HOSPITAL_COMMUNITY): Payer: Medicare Other

## 2015-05-18 ENCOUNTER — Encounter: Payer: Self-pay | Admitting: Vascular Surgery

## 2015-05-23 ENCOUNTER — Ambulatory Visit (HOSPITAL_COMMUNITY)
Admission: RE | Admit: 2015-05-23 | Discharge: 2015-05-23 | Disposition: A | Discharge status: Home / Self Care | Payer: Medicare Other | Source: Ambulatory Visit | Attending: Vascular Surgery | Admitting: Vascular Surgery

## 2015-05-23 ENCOUNTER — Encounter: Payer: Self-pay | Admitting: Vascular Surgery

## 2015-05-23 ENCOUNTER — Ambulatory Visit (INDEPENDENT_AMBULATORY_CARE_PROVIDER_SITE_OTHER): Payer: Medicare Other | Admitting: Vascular Surgery

## 2015-05-23 VITALS — BP 136/78 | HR 55 | Temp 97.4°F | Resp 16 | Ht 73.0 in | Wt 172.0 lb

## 2015-05-23 DIAGNOSIS — I868 Varicose veins of other specified sites: Secondary | ICD-10-CM | POA: Diagnosis not present

## 2015-05-23 DIAGNOSIS — R609 Edema, unspecified: Secondary | ICD-10-CM | POA: Diagnosis present

## 2015-05-23 DIAGNOSIS — K219 Gastro-esophageal reflux disease without esophagitis: Secondary | ICD-10-CM | POA: Insufficient documentation

## 2015-05-23 DIAGNOSIS — I10 Essential (primary) hypertension: Secondary | ICD-10-CM | POA: Diagnosis not present

## 2015-05-23 DIAGNOSIS — I8392 Asymptomatic varicose veins of left lower extremity: Secondary | ICD-10-CM | POA: Diagnosis not present

## 2015-05-23 DIAGNOSIS — I83892 Varicose veins of left lower extremities with other complications: Secondary | ICD-10-CM

## 2015-05-23 DIAGNOSIS — I8390 Asymptomatic varicose veins of unspecified lower extremity: Secondary | ICD-10-CM

## 2015-05-23 DIAGNOSIS — E785 Hyperlipidemia, unspecified: Secondary | ICD-10-CM | POA: Diagnosis not present

## 2015-05-23 NOTE — Progress Notes (Signed)
Subjective:     Patient ID: [name] [name], male   DOB: [birth year], 75 y.o.   MRN: [id number]  HPI this 75 year old male was referred by Dr. Oval Linsey for evaluation of varicose veins in the left leg. Patient has had large bulging varicosities from the knee to the ankle for the past 10 years. These are gradually gotten larger. This is associated with aching and throbbing discomfort in the medial calf area heaviness. He has not worn elastic compression stockings. He has no DVT by history or thrombophlebitis or pulmonary embolus. He has no symptoms in the contralateral right leg. The right saphenous vein has been removed in the past for coronary artery bypass grafting.  Past Medical History  Diagnosis Date  . Hypertension   . Hyperlipidemia   . Shingles   . GERD (gastroesophageal reflux disease)   . Cataracts, bilateral   . Glaucoma   . Varicose veins 03/31/2015    Social History  Substance Use Topics  . Smoking status: Former Smoker    Quit date: 05/23/1979  . Smokeless tobacco: Never Used     Comment: quit smoking in 1982  . Alcohol Use: 0.0 oz/week    0 Standard drinks or equivalent per week    Family History  Problem Relation Age of Onset  . Ovarian cancer Mother     Allergies  Allergen Reactions  . Hydrochlorothiazide Swelling     Current outpatient prescriptions:  .  acetaminophen (TYLENOL) 500 MG tablet, Take 500 mg by mouth every 6 (six) hours as needed., Disp: , Rfl:  .  acyclovir (ZOVIRAX) 800 MG tablet, Take 400 mg by mouth 2 (two) times daily., Disp: , Rfl:  .  amLODipine (NORVASC) 2.5 MG tablet, Take 1 tablet (2.5 mg total) by mouth daily., Disp: 30 tablet, Rfl: 5 .  aspirin EC 81 MG tablet, Take 1 tablet (81 mg total) by mouth daily., Disp: 90 tablet, Rfl: 3 .  carvedilol (COREG) 12.5 MG tablet, Take 1 tablet (12.5 mg total) by mouth 2 (two) times daily., Disp: 60 tablet, Rfl: 3 .  esomeprazole (NEXIUM) 20 MG capsule, Take 20 mg by mouth daily before breakfast.  Reported on 03/01/2015, Disp: , Rfl:  .  lisinopril (PRINIVIL,ZESTRIL) 40 MG tablet, 40 mg daily., Disp: , Rfl: 0 .  meloxicam (MOBIC) 15 MG tablet, Take 15 mg by mouth daily., Disp: , Rfl:  .  Multiple Vitamins-Minerals (MULTIVITAMIN WITH MINERALS) tablet, Take 1 tablet by mouth daily., Disp: , Rfl:  .  oxybutynin (DITROPAN-XL) 5 MG 24 hr tablet, Take 5 mg by mouth at bedtime., Disp: , Rfl:  .  prednisoLONE acetate (PRED FORTE) 1 % ophthalmic suspension, Place 1 drop into the left eye daily., Disp: , Rfl:  .  sildenafil (REVATIO) 20 MG tablet, Take 20 mg by mouth daily as needed., Disp: , Rfl:  .  simvastatin (ZOCOR) 40 MG tablet, Take 20 mg by mouth daily., Disp: , Rfl:  .  timolol (BETIMOL) 0.5 % ophthalmic solution, Place 2 drops into the left eye daily. , Disp: , Rfl:   Filed Vitals:   05/23/15 1301  BP: 136/78  Pulse: 55  Temp: 97.4 F (36.3 C)  Resp: 16  Height: 6\' 1"  (1.854 m)  Weight: 172 lb (78.019 kg)  SpO2: 96%    Body mass index is 22.7 kg/(m^2).           Review of Systems denies chest pain   , dyspnea on exertion, PND, orthopnea, hemoptysis, claudication. Objective:  Physical Exam BP 136/78 mmHg  Pulse 55  Temp(Src) 97.4 F (36.3 C)  Resp 16  Ht 6\' 1"  (1.854 m)  Wt 172 lb (78.019 kg)  BMI 22.70 kg/m2  SpO2 96%    Gen.-alert and oriented x3 in no apparent distress HEENT normal for age Lungs no rhonchi or wheezing Cardiovascular regular rhythm no murmurs carotid pulses 3+ palpable no bruits audible Abdomen soft nontender no palpable masses Musculoskeletal free of  major deformities Skin clear -no rashes Neurologic normal Lower extremities 3+ femoral and dorsalis pedis pulses palpable bilaterally with no edema on the right 1+ edema on the left Bulging varicosities over the great saphenous system beginning at the knee medially extending posteriorly and anteriorly down to the medial malleolus. No active ulceration is noted  Today a formal venous  duplex exam was performed in our office which I reviewed and interpreted. The patient has gross reflux throughout the left great saphenous vein extending down to the proximal calf supplying these painful varicosities and there is no DVT     Assessment:     Painful varicosities left leg due to gross reflux left great saphenous vein. This is affecting patient's daily living and they are becoming progressively larger.   artery disease status post coronary artery bypass grafting Hypertension Hyperlipidemia GERD Plan:         #1 long leg elastic compression stockings 20-30 mm gradient #2 elevate legs as much as possible #3 ibuprofen daily on a regular basis for pain #4 return in 3 months-if no significant improvement then patient will need #1 laser ablation left great saphenous 5 followed by three-month waiting. 2 then be evaluated for possible stab phlebectomy of painful varicosities if indicated Return in 3 months

## 2015-06-07 ENCOUNTER — Other Ambulatory Visit: Payer: Self-pay | Admitting: Cardiovascular Disease

## 2015-06-07 NOTE — Telephone Encounter (Signed)
Review for refill, Thank you. 

## 2015-06-07 NOTE — Telephone Encounter (Signed)
Rx request sent to pharmacy.  

## 2015-07-01 NOTE — Progress Notes (Signed)
8   Cardiology Office Note   Date:  07/03/2015   ID:  BRENTIN ALANA, DOB 15-Feb-1940, MRN [id number]  PCP:  [name], MD  Cardiologist:   [name], MD  CT Surgeon: [name]  Chief Complaint  Patient presents with  . Follow-up    3 months  pt c/o low BP; fatigue/weakness; voice changes--pt wife states it sounds like a tired/weak voice; no other Sx.    Patient ID: [name] [name] is a 75 y.o. male with hypertension, CAD s/p CABG (LIMA-->LAD, SVG-->RI/OM1, SVG_>PDA), bradycardia, and syncope who presents for follow up s/p CABG and AVR.    History of Present Illness:  [name] [name] underwent 4 vessel CABG and AVR (25 mm [facility] Ease pericardial tissue valve) on 09/28/14.  His post-operative course was complicated by atrial fibrillation/flutter, but he was discharged in sinus rhythm. He completed 24 sessions of cardiac rehab.  At his last appointment on 03/31/15 he was started on amlodipine due to poorly-controlled blood pressure.  He was referred to Dr. Kellie Simmering due to varicose veins.  He was encouraged to wear compression stockings, elevated his legs and use ibuprofen.  If this does not improve his symptoms he may need laser ablation and/or phlebectomy.  [name] [name] has been doing very well.  One month ago he was working on a deck and lost his balance.  He fell and landed on his ribs.  Although he did not bring his ribs they were quite sore for some time. He denied hitting his head. There is no chest pain or palpitations prior to the fall. Overall he is feeling well and denies chest pain or shortness of breath. He's been checking his blood pressure and it has been much better controlled. It has ranged 99-135/60-70s.   are    Past Medical History  Diagnosis Date  . Hypertension   . Hyperlipidemia   . Shingles   . GERD (gastroesophageal reflux disease)   . Cataracts, bilateral   . Glaucoma   . Varicose veins 03/31/2015    Past Surgical History  Procedure Laterality  Date  . Cataract extraction    . Prostatectomy    . Cardiac catheterization N/A 09/23/2014    Procedure: Left Heart Cath and Coronary Angiography;  Surgeon: [name], MD;  Location: [place] [facility];  Service: Cardiovascular;  Laterality: N/A;  . Coronary artery bypass graft N/A 09/28/2014    Procedure: CORONARY ARTERY BYPASS GRAFTING (CABG) x4 using left internal mammory artery and right saphenous leg vein harvested endoscopically.;  Surgeon: [name], MD;  Location: [place];  Service: Open Heart Surgery;  Laterality: N/A;  . Aortic valve replacement N/A 09/28/2014    Procedure: AORTIC VALVE REPLACEMENT (AVR);  Surgeon: [name], MD;  Location: [place];  Service: Open Heart Surgery;  Laterality: N/A;  . Tee without cardioversion N/A 09/28/2014    Procedure: TRANSESOPHAGEAL ECHOCARDIOGRAM (TEE);  Surgeon: [name], MD;  Location: [place];  Service: Open Heart Surgery;  Laterality: N/A;     Current Outpatient Prescriptions  Medication Sig Dispense Refill  . acetaminophen (TYLENOL) 500 MG tablet Take 500 mg by mouth every 6 (six) hours as needed.    Marland Kitchen acyclovir (ZOVIRAX) 800 MG tablet Take 400 mg by mouth 2 (two) times daily.    Marland Kitchen amLODipine (NORVASC) 2.5 MG tablet Take 1 tablet (2.5 mg total) by mouth daily. 30 tablet 5  . aspirin EC 81 MG tablet Take 1 tablet (81 mg total) by  mouth daily. 90 tablet 3  . carvedilol (COREG) 12.5 MG tablet TAKE ONE TABLET BY MOUTH TWICE DAILY 60 tablet 2  . esomeprazole (NEXIUM) 20 MG capsule Take 20 mg by mouth daily before breakfast. Reported on 03/01/2015    . meloxicam (MOBIC) 15 MG tablet Take 15 mg by mouth daily.    . Multiple Vitamins-Minerals (MULTIVITAMIN WITH MINERALS) tablet Take 1 tablet by mouth daily.    Marland Kitchen oxybutynin (DITROPAN-XL) 5 MG 24 hr tablet Take 5 mg by mouth at bedtime.    . prednisoLONE acetate (PRED FORTE) 1 % ophthalmic suspension Place 1 drop into the left eye daily.    . sildenafil (REVATIO) 20 MG  tablet Take 20 mg by mouth daily as needed.    . simvastatin (ZOCOR) 40 MG tablet Take 20 mg by mouth daily.    . timolol (BETIMOL) 0.5 % ophthalmic solution Place 2 drops into the left eye daily.     Marland Kitchen lisinopril (PRINIVIL,ZESTRIL) 10 MG tablet Take 1 tablet (10 mg total) by mouth every morning. 90 tablet 3  . lisinopril (PRINIVIL,ZESTRIL) 20 MG tablet Take 1 tablet (20 mg total) by mouth every evening. 90 tablet 3   No current facility-administered medications for this visit.    Allergies:   Hydrochlorothiazide    Social History:  The patient  reports that he quit smoking about 36 years ago. He has never used smokeless tobacco. He reports that he drinks alcohol. He reports that he does not use illicit drugs.   Family History:  The patient's family history includes Ovarian cancer in his mother.    ROS:  Please see the history of present illness.   Otherwise, review of systems are positive for none.   All other systems are reviewed and negative.    PHYSICAL EXAM: VS:  BP 122/80 mmHg  Pulse 51  Ht 6\' 1"  (1.854 m)  Wt 173 lb 9.6 oz (78.744 kg)  BMI 22.91 kg/m2 , BMI Body mass index is 22.91 kg/(m^2). GENERAL:  Well appearing HEENT:  Pupils equal round and reactive, fundi not visualized, oral mucosa unremarkable NECK:  No jugular venous distention, waveform within normal limits, carotid upstroke brisk and symmetric, no bruits, no thyromegaly LYMPHATICS:  No cervical adenopathy LUNGS:  Clear to auscultation bilaterally HEART:  RRR.  PMI not displaced or sustained,S1 and S2 within normal limits, no S3, no S4, no clicks, no rubs, II/VI sysotlic murmur at the RUSB. ABD:  Flat, positive bowel sounds normal in frequency in pitch, no bruits, no rebound, no guarding, no midline pulsatile mass, no hepatomegaly, no splenomegaly EXT:  2 plus pulses throughout, no edema, no cyanosis no clubbing.  Mildly tender, soft varicose veins on the left lower extremity. SKIN:  No rashes no nodules NEURO:   Cranial nerves II through XII grossly intact, motor grossly intact throughout PSYCH:  Cognitively intact, oriented to person place and time  EKG:  EKG is ordered today.   EKG 07/03/15 reveals sinus bradycardia 81 bpm. First degree AV block.  Recent Labs: 10/01/2014: Hemoglobin 9.5*; Platelets 91* 10/03/2014: BUN 18; Creatinine, Ser 0.87; Magnesium 2.1; Potassium 4.2; Sodium 136    Lipid Panel No results found for: CHOL, TRIG, HDL, CHOLHDL, VLDL, LDLCALC, LDLDIRECT    Wt Readings from Last 3 Encounters:  07/03/15 173 lb 9.6 oz (78.744 kg)  05/23/15 172 lb (78.019 kg)  03/31/15 173 lb 14.4 oz (78.881 kg)    Echo 11/01/14: Study Conclusions  - Left ventricle: The cavity size was normal. Wall  thickness was increased in a pattern of moderate LVH. Systolic function was normal. The estimated ejection fraction was in the range of 55% to 60%. Wall motion was normal; there were no regional wall motion abnormalities. - Aortic valve: Normal appearing tissue AVR with no peri valvular regurgitation. - Mitral valve: There was mild regurgitation. - Left atrium: The atrium was mildly dilated. - Atrial septum: No defect or patent foramen ovale was identified.  ASSESSMENT AND PLAN:  # Hypertensive heart disease: BP is much better-controlled. However, he reports some fatigue that he thinks may be due to low blood pressures. We will stop lisinopril and switch it to 10 mg in the morning and 20 mg nightly. Continue carvedilol and amlodipine.  He will continue to monitor his BP and call if it is >140/90.  # Hyperlipidemia: Managed by PCP.  Goal <100 or <70 if possible.  Continue simvastatin.  # CAD: S/p CABG and doing well.  Continue aspirin, carvedilol and simvastatin.    # s/p AVR: Mr. Banken has a bioprosthetic AVR and is doing well.  He was reminded of the need for antibiotic prophylaxis prior to dental procedures.  Current medicines are reviewed at length with the patient today.  The  patient does not have concerns regarding medicines.  The following changes have been made:  Reduce lisinopril to 30 mg daily.   Labs/ tests ordered today include:   Orders Placed This Encounter  Procedures  . EKG 12-Lead     Disposition:   FU with Denni France C. Oval Linsey, MD in 6 months.    Signed, [name], MD  07/03/2015 1:25 PM    [facility]

## 2015-07-03 ENCOUNTER — Ambulatory Visit (INDEPENDENT_AMBULATORY_CARE_PROVIDER_SITE_OTHER): Payer: Medicare Other | Admitting: Cardiovascular Disease

## 2015-07-03 ENCOUNTER — Encounter: Payer: Self-pay | Admitting: Cardiovascular Disease

## 2015-07-03 VITALS — BP 122/80 | HR 51 | Ht 73.0 in | Wt 173.6 lb

## 2015-07-03 DIAGNOSIS — I10 Essential (primary) hypertension: Secondary | ICD-10-CM

## 2015-07-03 DIAGNOSIS — E785 Hyperlipidemia, unspecified: Secondary | ICD-10-CM | POA: Diagnosis not present

## 2015-07-03 DIAGNOSIS — I119 Hypertensive heart disease without heart failure: Secondary | ICD-10-CM | POA: Diagnosis not present

## 2015-07-03 MED ORDER — LISINOPRIL 20 MG PO TABS: 20.0000 mg | ORAL_TABLET | Freq: Every evening | ORAL | Status: DC

## 2015-07-03 MED ORDER — LISINOPRIL 10 MG PO TABS: 10.0000 mg | ORAL_TABLET | Freq: Every morning | ORAL | Status: DC

## 2015-07-03 NOTE — Patient Instructions (Signed)
Medication Instructions:   CHANGE LISINOPRIL TO 10 MG IN THE MORNING AND 20 MG IN THE EVENING  Follow-Up:  Your physician wants you to follow-up in: [place] You will receive a reminder letter in the mail two months in advance. If you don't receive a letter, please call our office to schedule the follow-up appointment.

## 2015-08-24 ENCOUNTER — Encounter: Payer: Self-pay | Admitting: Vascular Surgery

## 2015-08-29 ENCOUNTER — Encounter: Payer: Self-pay | Admitting: Vascular Surgery

## 2015-08-29 ENCOUNTER — Ambulatory Visit (INDEPENDENT_AMBULATORY_CARE_PROVIDER_SITE_OTHER): Payer: Medicare Other | Admitting: Vascular Surgery

## 2015-08-29 VITALS — BP 138/86 | HR 52 | Temp 98.0°F | Resp 16 | Ht 73.0 in | Wt 170.0 lb

## 2015-08-29 DIAGNOSIS — I83892 Varicose veins of left lower extremities with other complications: Secondary | ICD-10-CM | POA: Diagnosis not present

## 2015-08-29 NOTE — Progress Notes (Signed)
Subjective:     Patient ID: [name] [name], male   DOB: 02-15-1940, 75 y.o.   MRN: [id number]  HPI this 75 year old male returns for further follow-up regarding his painful varicosities in the left leg. He has enlarging old is beginning in the medial distal thigh extending to the medial malleolus and posteriorly into the calf. These cause aching throbbing discomfort as the day progresses and he does develop swelling in the ankle. He has no history of stasis ulcers or bleeding. He is tried long-leg elastic compression stockings 20-30 millimeter gradient with no improvement in his symptoms which are affecting his daily living.  Past Medical History:  Diagnosis Date  . Cataracts, bilateral   . GERD (gastroesophageal reflux disease)   . Glaucoma   . Hyperlipidemia   . Hypertension   . Shingles   . Varicose veins 03/31/2015    Social History  Substance Use Topics  . Smoking status: Former Smoker    Quit date: 05/23/1979  . Smokeless tobacco: Never Used     Comment: quit smoking in 1982  . Alcohol use 0.0 oz/week    Family History  Problem Relation Age of Onset  . Ovarian cancer Mother     Allergies  Allergen Reactions  . Hydrochlorothiazide Swelling     Current Outpatient Prescriptions:  .  acetaminophen (TYLENOL) 500 MG tablet, Take 500 mg by mouth every 6 (six) hours as needed., Disp: , Rfl:  .  acyclovir (ZOVIRAX) 800 MG tablet, Take 400 mg by mouth 2 (two) times daily., Disp: , Rfl:  .  amLODipine (NORVASC) 2.5 MG tablet, Take 1 tablet (2.5 mg total) by mouth daily., Disp: 30 tablet, Rfl: 5 .  aspirin EC 81 MG tablet, Take 1 tablet (81 mg total) by mouth daily., Disp: 90 tablet, Rfl: 3 .  carvedilol (COREG) 12.5 MG tablet, TAKE ONE TABLET BY MOUTH TWICE DAILY, Disp: 60 tablet, Rfl: 2 .  esomeprazole (NEXIUM) 20 MG capsule, Take 20 mg by mouth daily before breakfast. Reported on 03/01/2015, Disp: , Rfl:  .  lisinopril (PRINIVIL,ZESTRIL) 10 MG tablet, Take 1 tablet (10 mg total) by  mouth every morning., Disp: 90 tablet, Rfl: 3 .  lisinopril (PRINIVIL,ZESTRIL) 20 MG tablet, Take 1 tablet (20 mg total) by mouth every evening., Disp: 90 tablet, Rfl: 3 .  meloxicam (MOBIC) 15 MG tablet, Take 15 mg by mouth daily., Disp: , Rfl:  .  Multiple Vitamins-Minerals (MULTIVITAMIN WITH MINERALS) tablet, Take 1 tablet by mouth daily., Disp: , Rfl:  .  oxybutynin (DITROPAN-XL) 5 MG 24 hr tablet, Take 5 mg by mouth at bedtime., Disp: , Rfl:  .  prednisoLONE acetate (PRED FORTE) 1 % ophthalmic suspension, Place 1 drop into the left eye daily., Disp: , Rfl:  .  sildenafil (REVATIO) 20 MG tablet, Take 20 mg by mouth daily as needed., Disp: , Rfl:  .  simvastatin (ZOCOR) 40 MG tablet, Take 20 mg by mouth daily., Disp: , Rfl:  .  timolol (BETIMOL) 0.5 % ophthalmic solution, Place 2 drops into the left eye daily. , Disp: , Rfl:   Vitals:   08/29/15 1553  BP: 138/86  Pulse: (!) 52  Resp: 16  Temp: 98 F (36.7 C)  SpO2: 100%  Weight: 170 lb (77.1 kg)  Height: 6\' 1"  (1.854 m)    Body mass index is 22.43 kg/m.          Review of Systems denies chest pain, dyspnea on exertion, PND, orthopnea, hemoptysis  Objective:   Physical Exam BP 138/86   Pulse (!) 52   Temp 98 F (36.7 C)   Resp 16   Ht 6\' 1"  (1.854 m)   Wt 170 lb (77.1 kg)   SpO2 100%   BMI 22.43 kg/m   Gen. well-developed well-nourished male no apparent distress alert and oriented 3 Lungs no rhonchi or wheezing Left leg with large bulging varicosities over the great saphenous vein beginning at the knee level extending into the posterior calf and medial calf down to the medial malleolus. No hyperpigmentation or ulceration noted. 2+ dorsalis pedis pulse palpable. 1+ edema present.  Patient has documented gross reflux and large caliber left great saphenous vein supplying these painful varicosities Today I confirmed this with bedside SonoSite ultrasound exam which confirms a large caliber great saphenous vein on  the left with gross reflux     Assessment:     Painful varicosities left leg with symptoms which are resistant to conservative measures including long-leg elastic compression stockings 20-30 millimeter gradient, elevation, and ibuprofen. These are due to gross reflux and large caliber left great saphenous vein    Plan:     Patient needs #1 laser ablation left great saphenous vein followed by three-month waiting period and then be reevaluated for multiple stab phlebectomy of painful varicosities to relieve his symptoms which are affecting his daily living and prevent progression of skin problems We will proceed with precertification to perform this in the near future

## 2015-09-07 ENCOUNTER — Other Ambulatory Visit: Payer: Self-pay | Admitting: *Deleted

## 2015-09-07 DIAGNOSIS — I83812 Varicose veins of left lower extremities with pain: Secondary | ICD-10-CM

## 2015-09-10 ENCOUNTER — Emergency Department (HOSPITAL_COMMUNITY)
Admission: EM | Admit: 2015-09-10 | Discharge: 2015-09-11 | Disposition: A | Discharge status: Home / Self Care | Payer: Medicare Other | Attending: Emergency Medicine | Admitting: Emergency Medicine

## 2015-09-10 ENCOUNTER — Emergency Department (HOSPITAL_COMMUNITY): Payer: Medicare Other

## 2015-09-10 ENCOUNTER — Encounter (HOSPITAL_COMMUNITY): Payer: Self-pay

## 2015-09-10 DIAGNOSIS — E860 Dehydration: Secondary | ICD-10-CM | POA: Diagnosis not present

## 2015-09-10 DIAGNOSIS — Z7982 Long term (current) use of aspirin: Secondary | ICD-10-CM | POA: Insufficient documentation

## 2015-09-10 DIAGNOSIS — Z951 Presence of aortocoronary bypass graft: Secondary | ICD-10-CM | POA: Insufficient documentation

## 2015-09-10 DIAGNOSIS — Z79899 Other long term (current) drug therapy: Secondary | ICD-10-CM | POA: Insufficient documentation

## 2015-09-10 DIAGNOSIS — I10 Essential (primary) hypertension: Secondary | ICD-10-CM | POA: Insufficient documentation

## 2015-09-10 DIAGNOSIS — N179 Acute kidney failure, unspecified: Secondary | ICD-10-CM | POA: Diagnosis not present

## 2015-09-10 DIAGNOSIS — I2510 Atherosclerotic heart disease of native coronary artery without angina pectoris: Secondary | ICD-10-CM | POA: Diagnosis not present

## 2015-09-10 DIAGNOSIS — Z87891 Personal history of nicotine dependence: Secondary | ICD-10-CM | POA: Diagnosis not present

## 2015-09-10 DIAGNOSIS — R55 Syncope and collapse: Secondary | ICD-10-CM | POA: Diagnosis not present

## 2015-09-10 LAB — CBC WITH DIFFERENTIAL/PLATELET
Basophils Absolute: 0 10*3/uL (ref 0.0–0.1)
Basophils Relative: 0 %
EOS ABS: 0.5 10*3/uL (ref 0.0–0.7)
EOS PCT: 5 %
HCT: 44.2 % (ref 39.0–52.0)
Hemoglobin: 14.9 g/dL (ref 13.0–17.0)
LYMPHS ABS: 1.1 10*3/uL (ref 0.7–4.0)
LYMPHS PCT: 10 %
MCH: 32.4 pg (ref 26.0–34.0)
MCHC: 33.7 g/dL (ref 30.0–36.0)
MCV: 96.1 fL (ref 78.0–100.0)
MONO ABS: 0.8 10*3/uL (ref 0.1–1.0)
MONOS PCT: 7 %
Neutro Abs: 8.8 10*3/uL — ABNORMAL HIGH (ref 1.7–7.7)
Neutrophils Relative %: 78 %
PLATELETS: 181 10*3/uL (ref 150–400)
RBC: 4.6 MIL/uL (ref 4.22–5.81)
RDW: 12.4 % (ref 11.5–15.5)
WBC: 11.3 10*3/uL — AB (ref 4.0–10.5)

## 2015-09-10 LAB — COMPREHENSIVE METABOLIC PANEL
ALT: 17 U/L (ref 17–63)
ANION GAP: 6 (ref 5–15)
AST: 23 U/L (ref 15–41)
Albumin: 4.1 g/dL (ref 3.5–5.0)
Alkaline Phosphatase: 60 U/L (ref 38–126)
BUN: 38 mg/dL — ABNORMAL HIGH (ref 6–20)
CHLORIDE: 100 mmol/L — AB (ref 101–111)
CO2: 26 mmol/L (ref 22–32)
CREATININE: 1.46 mg/dL — AB (ref 0.61–1.24)
Calcium: 9.9 mg/dL (ref 8.9–10.3)
GFR, EST AFRICAN AMERICAN: 52 mL/min — AB (ref >60)
GFR, EST NON AFRICAN AMERICAN: 45 mL/min — AB (ref >60)
Glucose, Bld: 110 mg/dL — ABNORMAL HIGH (ref 65–99)
POTASSIUM: 6.3 mmol/L — AB (ref 3.5–5.1)
SODIUM: 132 mmol/L — AB (ref 135–145)
Total Bilirubin: 0.7 mg/dL (ref 0.3–1.2)
Total Protein: 7.1 g/dL (ref 6.5–8.1)

## 2015-09-10 LAB — I-STAT TROPONIN, ED
TROPONIN I, POC: 0 ng/mL (ref 0.00–0.08)
Troponin i, poc: 0 ng/mL (ref 0.00–0.08)

## 2015-09-10 LAB — POTASSIUM: POTASSIUM: 4.8 mmol/L (ref 3.5–5.1)

## 2015-09-10 MED ORDER — SODIUM CHLORIDE 0.9 % IV BOLUS (SEPSIS)
500.0000 mL | Freq: Once | INTRAVENOUS | Status: AC
  Administered 2015-09-10: 500 mL via INTRAVENOUS

## 2015-09-10 NOTE — ED Notes (Signed)
Pt ambulated to restroom located in room. Tolerated well, wife assisted pt.

## 2015-09-10 NOTE — ED Provider Notes (Signed)
[facility] DEPT Provider Note   CSN: [id number] Arrival date & time: 09/10/15  1044     History   Chief Complaint Chief Complaint  Patient presents with  . Near Syncope    HPI [name] [name] is a 75 y.o. male.  HPI   When stood up in church, began to feel diaphoretic, layed down in chairs and started putting cold towels on and gave aspirin.  Called 911.  Felt lightheaded but did not have syncope.  Lasted total 45 minutes.  No chest pain, no shortness of breath.  URI last week, felt like that had improved.  Chlorocetin.  No cough, fevers, abd pain, numbness/weakness one side/facial droop/headache/falls.  Yesterday was out working hard in the yard yesterday in the heat, had to sit down because of fatigue.  Past Medical History:  Diagnosis Date  . Cataracts, bilateral   . GERD (gastroesophageal reflux disease)   . Glaucoma   . Hyperlipidemia   . Hypertension   . Shingles   . Varicose veins 03/31/2015    Patient Active Problem List   Diagnosis Date Noted  . Varicose veins of left lower extremity with complications 123456  . Varicose veins 03/31/2015  . Bradycardia 09/29/2014  . S/P CABG (coronary artery bypass graft)   . S/P CABG x 4 09/28/2014  . Unstable angina pectoris ([place]) 09/23/2014  . Cardiac murmur, previously undiagnosed 09/23/2014  . Coronary artery disease involving native heart with unstable angina pectoris ([place]) 09/23/2014  . Unstable angina ([place]) 09/23/2014  . Syncope and collapse 09/30/2013  . Syncope 09/30/2013  . Essential hypertension, benign 09/30/2013  . GERD (gastroesophageal reflux disease) 09/30/2013    Past Surgical History:  Procedure Laterality Date  . AORTIC VALVE REPLACEMENT N/A 09/28/2014   Procedure: AORTIC VALVE REPLACEMENT (AVR);  Surgeon: [name], MD;  Location: [place];  Service: Open Heart Surgery;  Laterality: N/A;  . CARDIAC CATHETERIZATION N/A 09/23/2014   Procedure: Left Heart Cath and Coronary Angiography;   Surgeon: [name], MD;  Location: [place] [facility];  Service: Cardiovascular;  Laterality: N/A;  . CATARACT EXTRACTION    . CORONARY ARTERY BYPASS GRAFT N/A 09/28/2014   Procedure: CORONARY ARTERY BYPASS GRAFTING (CABG) x4 using left internal mammory artery and right saphenous leg vein harvested endoscopically.;  Surgeon: [name], MD;  Location: [place];  Service: Open Heart Surgery;  Laterality: N/A;  . PROSTATECTOMY    . TEE WITHOUT CARDIOVERSION N/A 09/28/2014   Procedure: TRANSESOPHAGEAL ECHOCARDIOGRAM (TEE);  Surgeon: [name], MD;  Location: [place];  Service: Open Heart Surgery;  Laterality: N/A;       Home Medications    Prior to Admission medications   Medication Sig Start Date End Date Taking? Authorizing Provider  acetaminophen (TYLENOL) 500 MG tablet Take 500 mg by mouth every 6 (six) hours as needed.    Historical Provider, MD  acyclovir (ZOVIRAX) 800 MG tablet Take 400 mg by mouth 2 (two) times daily.    Historical Provider, MD  amLODipine (NORVASC) 2.5 MG tablet Take 1 tablet (2.5 mg total) by mouth daily. 03/31/15   [name], MD  aspirin EC 81 MG tablet Take 1 tablet (81 mg total) by mouth daily. 11/01/14   [name], MD  carvedilol (COREG) 12.5 MG tablet TAKE ONE TABLET BY MOUTH TWICE DAILY 06/07/15   [name], MD  esomeprazole (NEXIUM) 20 MG capsule Take 20 mg by mouth daily before breakfast. Reported on 03/01/2015    Historical Provider,  MD  lisinopril (PRINIVIL,ZESTRIL) 10 MG tablet Take 1 tablet (10 mg total) by mouth every morning. 07/03/15   [name], MD  lisinopril (PRINIVIL,ZESTRIL) 20 MG tablet Take 1 tablet (20 mg total) by mouth every evening. 07/03/15   [name], MD  meloxicam (MOBIC) 15 MG tablet Take 15 mg by mouth daily.    Historical Provider, MD  Multiple Vitamins-Minerals (MULTIVITAMIN WITH MINERALS) tablet Take 1 tablet by mouth daily.    Historical Provider, MD  oxybutynin (DITROPAN-XL) 5 MG 24  hr tablet Take 5 mg by mouth at bedtime.    Historical Provider, MD  prednisoLONE acetate (PRED FORTE) 1 % ophthalmic suspension Place 1 drop into the left eye daily.    Historical Provider, MD  sildenafil (REVATIO) 20 MG tablet Take 20 mg by mouth daily as needed.    Historical Provider, MD  simvastatin (ZOCOR) 40 MG tablet Take 20 mg by mouth daily.    Historical Provider, MD  timolol (BETIMOL) 0.5 % ophthalmic solution Place 2 drops into the left eye daily.     Historical Provider, MD    Family History Family History  Problem Relation Age of Onset  . Ovarian cancer Mother     Social History Social History  Substance Use Topics  . Smoking status: Former Smoker    Quit date: 05/23/1979  . Smokeless tobacco: Never Used     Comment: quit smoking in 1982  . Alcohol use 0.0 oz/week     Allergies   Hydrochlorothiazide   Review of Systems Review of Systems  Constitutional: Negative for fever.  HENT: Negative for sore throat.   Eyes: Negative for visual disturbance.  Respiratory: Negative for shortness of breath.   Cardiovascular: Negative for chest pain.  Gastrointestinal: Negative for abdominal pain.  Genitourinary: Negative for difficulty urinating.  Musculoskeletal: Negative for back pain and neck stiffness.  Skin: Negative for rash.  Neurological: Positive for light-headedness. Negative for syncope, facial asymmetry, speech difficulty, weakness and headaches.     Physical Exam Updated Vital Signs BP 112/77 (BP Location: Right Arm)   Pulse 61   Temp 97.7 F (36.5 C) (Oral)   Resp 22   Ht 6\' 1"  (1.854 m)   Wt 168 lb (76.2 kg)   SpO2 97%   BMI 22.16 kg/m   Physical Exam  Constitutional: He is oriented to person, place, and time. He appears well-developed and well-nourished. No distress.  HENT:  Head: Normocephalic and atraumatic.  Eyes: Conjunctivae and EOM are normal.  Neck: Normal range of motion.  Cardiovascular: Normal rate, regular rhythm, normal heart  sounds and intact distal pulses.  Exam reveals no gallop and no friction rub.   No murmur heard. Pulmonary/Chest: Effort normal and breath sounds normal. No respiratory distress. He has no wheezes. He has no rales.  Abdominal: Soft. He exhibits no distension. There is no tenderness. There is no guarding.  Musculoskeletal: He exhibits no edema.  Neurological: He is alert and oriented to person, place, and time. He has normal strength. He displays no tremor. No cranial nerve deficit or sensory deficit. Coordination normal. GCS eye subscore is 4. GCS verbal subscore is 5. GCS motor subscore is 6.  Skin: Skin is warm and dry. He is not diaphoretic.  Nursing note and vitals reviewed.    ED Treatments / Results  Labs (all labs ordered are listed, but only abnormal results are displayed) Labs Reviewed  CBC WITH DIFFERENTIAL/PLATELET - Abnormal; Notable for the following:  Result Value   WBC 11.3 (*)    Neutro Abs 8.8 (*)    All other components within normal limits  COMPREHENSIVE METABOLIC PANEL - Abnormal; Notable for the following:    Sodium 132 (*)    Potassium 6.3 (*)    Chloride 100 (*)    Glucose, Bld 110 (*)    BUN 38 (*)    Creatinine, Ser 1.46 (*)    GFR calc non Af Amer 45 (*)    GFR calc Af Amer 52 (*)    All other components within normal limits  POTASSIUM  I-STAT TROPOININ, ED  I-STAT TROPOININ, ED  I-STAT TROPOININ, ED    EKG  EKG Interpretation  Date/Time:  Sunday September 10 2015 10:50:08 EDT Ventricular Rate:  88 PR Interval:    QRS Duration: 111 QT Interval:  469 QTC Calculation: 599 R Axis:   -90 Text Interpretation:  Sinus rhythm Prolonged PR interval Baseline wander in lead(s) V3 V4 Interpretation limited secondary to artifact Since prior ECG, patient is now in sinus rhythm, no longer with junctional rhythm Confirmed by [facility] MD, [place] ([id number]) on 09/10/2015 10:54:36 AM       Radiology Dg Chest 2 View  Result Date: 09/10/2015 CLINICAL DATA:   Dizziness with near syncope EXAM: CHEST  2 VIEW COMPARISON:  November 22, 2014 FINDINGS: There is mild scarring in the left base with mild elevation of the left hemidiaphragm. There is no edema or consolidation. Heart size and pulmonary vascularity are within normal limits. Patient is status post aortic valve replacement with coronary artery bypass grafting. There is atherosclerotic calcification in the aorta. No adenopathy. No bone lesions. IMPRESSION: Mild scarring left base with elevation of the left hemidiaphragm. No edema or consolidation. Stable cardiac silhouette. There is aortic atherosclerosis. Electronically Signed   By: Lowella Grip III M.D.   On: 09/10/2015 11:44    Procedures Procedures (including critical care time)  Medications Ordered in ED Medications  sodium chloride 0.9 % bolus 500 mL (500 mLs Intravenous New Bag/Given 09/10/15 1312)     Initial Impression / Assessment and Plan / ED Course  I have reviewed the triage vital signs and the nursing notes.  Pertinent labs & imaging results that were available during my care of the patient were reviewed by me and considered in my medical decision making (see chart for details).  Clinical Course   15--year-old male with a history of coronary artery disease status post CABG,hypertension, bradycardia, aortic valve replacement  Presents with concern for near syncope while at church.   EKG evaluated by me and shows sinus rhythm with no sign of prolonged QTc, no brugada, no sign of HOCM, no ST abnormalities although is limited by artifact. Patient with continued sinus rhythm during monitoring in ED with normal blood pressures and asymptomatic.  Hgb WNL. K repeated and normal.  Mild hyponatremia and acute kidney injury, likely secondary to dehydration. Troponin x2 negative.  Doubt ACS or pulmonary embolus given patient without chest pain or shortness of breath.  Patient without HA to suggest [place]. No abd pain to suggest AAA.  Otherwise in  normal state of health and doubt acute GI bleed or infection.  Given a history of patient  Working in the heat yesterday, and today developing lightheadedness in setting up going from sitting to standing,  feelmost likely etiologyof patients lightheadedness is dehydration or vasovagal.Given patient's cardiac history, recommend close cardiology follow-up.In addition, recommend follow-up with PCP regarding renal function. Recommended avoiding NSAIDs, staying hydrated,  and having a recheck of renal functionnext week with PCP. Patient discharged in stable condition with understanding of reasons to return.    Final Clinical Impressions(s) / ED Diagnoses   Final diagnoses:  Near syncope  Acute kidney injury ([place])  Dehydration    New Prescriptions New Prescriptions   No medications on file     Gareth Morgan, MD 09/10/15 1954

## 2015-09-10 NOTE — ED Triage Notes (Signed)
Pt arrives EMS with c/o near syncope during church. Pt states he became dizzy and diaphorectic on standing at end of service. He states he feels good now. Denies cghest pain

## 2015-09-10 NOTE — ED Notes (Signed)
MD at bedside. 

## 2015-09-10 NOTE — ED Notes (Signed)
Myself and Kaitlyn, EMT undressed patient, placed in gown, on monitor, continuous pulse oximetry and blood pressure cuff; visitor at bedside

## 2015-09-10 NOTE — ED Notes (Addendum)
Wheeled pt out to wife.

## 2015-09-10 NOTE — ED Notes (Signed)
Pt and wife state they understand instructions and pt home stable with steady gait.

## 2015-09-13 ENCOUNTER — Encounter: Payer: Self-pay | Admitting: Vascular Surgery

## 2015-09-18 ENCOUNTER — Ambulatory Visit (INDEPENDENT_AMBULATORY_CARE_PROVIDER_SITE_OTHER): Payer: Medicare Other | Admitting: Vascular Surgery

## 2015-09-18 ENCOUNTER — Encounter: Payer: Self-pay | Admitting: Vascular Surgery

## 2015-09-18 VITALS — BP 150/86 | HR 90 | Temp 98.7°F | Resp 16 | Ht 73.0 in | Wt 165.0 lb

## 2015-09-18 DIAGNOSIS — I83892 Varicose veins of left lower extremities with other complications: Secondary | ICD-10-CM

## 2015-09-18 NOTE — Progress Notes (Signed)
Subjective:     Patient ID: [name] [name], male   DOB: [birth year], 75 y.o.   MRN: [id number]  HPI This 75 year old male had laser ablation of the left great saphenous vein from the distal thigh to near the saphenofemoral junction performed under local tumescent anesthesia. A total of 1946 J of energy was utilized. He tolerated the procedure well.  Review of Systems     Objective:   Physical Exam BP (!) 150/86   Pulse 90   Temp 98.7 F (37.1 C)   Resp 16   Ht 6\' 1"  (1.854 m)   Wt 165 lb (74.8 kg)   SpO2 100%   BMI 21.77 kg/m        Assessment:     Well-tolerated laser ablation left great saphenous vein performed under local tumescent anesthesia for painful varicosities    Plan:     Return in 1 week for venous duplex exam to confirm closure left great saphenous vein Patient will then return in 3 months to evaluate for possible stab phlebectomy of painful varicosities

## 2015-09-18 NOTE — Progress Notes (Signed)
Laser Ablation Procedure    Date: 09/18/2015   [name] [name] DOB:April 11, 1940  Consent signed: Yes    Surgeon:  Dr. Nelda Severe. Kellie Simmering  Procedure: Laser Ablation: left Greater Saphenous Vein  BP (!) 150/86   Pulse 90   Temp 98.7 F (37.1 C)   Resp 16   Ht 6\' 1"  (1.854 m)   Wt 165 lb (74.8 kg)   SpO2 100%   BMI 21.77 kg/m   Tumescent Anesthesia: 325 cc 0.9% NaCl with 50 cc Lidocaine HCL with 1% Epi and 15 cc 8.4% NaHCO3  Local Anesthesia: 8 cc Lidocaine HCL and NaHCO3 (ratio 2:1)  Pulsed Mode: 15 watts, 575ms delay, 1.0 duration  Total Energy: 1946             Total Pulses:130                Total Time: 2:10    Patient tolerated procedure well  Notes:   Description of Procedure:  After marking the course of the secondary varicosities, the patient was placed on the operating table in the supine position, and the left leg was prepped and draped in sterile fashion.   Local anesthetic was administered and under ultrasound guidance the saphenous vein was accessed with a micro needle and guide wire; then the mirco puncture sheath was placed.  A guide wire was inserted saphenofemoral junction , followed by a 5 french sheath.  The position of the sheath and then the laser fiber below the junction was confirmed using the ultrasound.  Tumescent anesthesia was administered along the course of the saphenous vein using ultrasound guidance. The patient was placed in Trendelenburg position and protective laser glasses were placed on patient and staff, and the laser was fired at 15 watts continuous mode advancing 1-72mm/second for a total of 1946 joules.     Steri strips were applied to the stab wounds and ABD pads and thigh high compression stockings were applied.  Ace wrap bandages were applied over the phlebectomy sites and at the top of the saphenofemoral junction. Blood loss was less than 15 cc.  The patient ambulated out of the operating room having tolerated the procedure well.

## 2015-09-19 ENCOUNTER — Encounter: Payer: Self-pay | Admitting: Vascular Surgery

## 2015-09-19 ENCOUNTER — Telehealth: Payer: Self-pay | Admitting: *Deleted

## 2015-09-19 NOTE — Telephone Encounter (Signed)
Pt doing well. Following all instructions. No issues.

## 2015-09-26 ENCOUNTER — Ambulatory Visit (INDEPENDENT_AMBULATORY_CARE_PROVIDER_SITE_OTHER): Payer: Medicare Other | Admitting: Vascular Surgery

## 2015-09-26 ENCOUNTER — Encounter: Payer: Self-pay | Admitting: Vascular Surgery

## 2015-09-26 ENCOUNTER — Ambulatory Visit (HOSPITAL_COMMUNITY)
Admission: RE | Admit: 2015-09-26 | Discharge: 2015-09-26 | Disposition: A | Discharge status: Home / Self Care | Payer: Medicare Other | Source: Ambulatory Visit | Attending: Vascular Surgery | Admitting: Vascular Surgery

## 2015-09-26 VITALS — BP 146/89 | HR 53 | Temp 97.3°F | Resp 16 | Ht 73.0 in | Wt 167.0 lb

## 2015-09-26 DIAGNOSIS — I83892 Varicose veins of left lower extremities with other complications: Secondary | ICD-10-CM | POA: Diagnosis not present

## 2015-09-26 DIAGNOSIS — I83812 Varicose veins of left lower extremities with pain: Secondary | ICD-10-CM

## 2015-09-26 DIAGNOSIS — Z9889 Other specified postprocedural states: Secondary | ICD-10-CM | POA: Diagnosis present

## 2015-09-26 NOTE — Progress Notes (Signed)
Vitals:   09/26/15 0944  BP: (!) 146/83  Pulse: (!) 53  Resp: 16  Temp: 97.3 F (36.3 C)  SpO2: 98%  Weight: 167 lb (75.8 kg)  Height: 6\' 1"  (1.854 m)

## 2015-09-26 NOTE — Progress Notes (Signed)
Subjective:     Patient ID: [name] [name], male   DOB: Feb 17, 1940, 75 y.o.   MRN: [id number]  HPI this 75 year old male returns 1 week post-laser ablation left great saphenous vein for painful varicosities with gross reflux. He had some mild-to-moderate discomfort in the left medial and proximal thigh. He did take ibuprofen as instructed and use ice compresses which will were beneficial. He did wear his long leg elastic compression stockings 20-30 millimeter gradient. He has not experienced any edema in the left ankle.  Past Medical History:  Diagnosis Date  . Cataracts, bilateral   . GERD (gastroesophageal reflux disease)   . Glaucoma   . Hyperlipidemia   . Hypertension   . Shingles   . Varicose veins 03/31/2015    Social History  Substance Use Topics  . Smoking status: Former Smoker    Quit date: 05/23/1979  . Smokeless tobacco: Never Used     Comment: quit smoking in 1982  . Alcohol use 0.0 oz/week    Family History  Problem Relation Age of Onset  . Ovarian cancer Mother     Allergies  Allergen Reactions  . Hydrochlorothiazide Swelling     Current Outpatient Prescriptions:  .  acetaminophen (TYLENOL) 500 MG tablet, Take 500 mg by mouth every 6 (six) hours as needed., Disp: , Rfl:  .  acyclovir (ZOVIRAX) 800 MG tablet, Take 400 mg by mouth 2 (two) times daily., Disp: , Rfl:  .  amLODipine (NORVASC) 2.5 MG tablet, Take 1 tablet (2.5 mg total) by mouth daily., Disp: 30 tablet, Rfl: 5 .  aspirin EC 81 MG tablet, Take 1 tablet (81 mg total) by mouth daily., Disp: 90 tablet, Rfl: 3 .  carvedilol (COREG) 12.5 MG tablet, TAKE ONE TABLET BY MOUTH TWICE DAILY, Disp: 60 tablet, Rfl: 2 .  esomeprazole (NEXIUM) 20 MG capsule, Take 20 mg by mouth daily before breakfast. Reported on 03/01/2015, Disp: , Rfl:  .  lisinopril (PRINIVIL,ZESTRIL) 10 MG tablet, Take 1 tablet (10 mg total) by mouth every morning., Disp: 90 tablet, Rfl: 3 .  lisinopril (PRINIVIL,ZESTRIL) 20 MG tablet, Take 1  tablet (20 mg total) by mouth every evening., Disp: 90 tablet, Rfl: 3 .  meloxicam (MOBIC) 15 MG tablet, Take 15 mg by mouth daily., Disp: , Rfl:  .  Multiple Vitamins-Minerals (MULTIVITAMIN WITH MINERALS) tablet, Take 1 tablet by mouth daily., Disp: , Rfl:  .  oxybutynin (DITROPAN-XL) 5 MG 24 hr tablet, Take 5 mg by mouth at bedtime., Disp: , Rfl:  .  prednisoLONE acetate (PRED FORTE) 1 % ophthalmic suspension, Place 1 drop into the left eye daily., Disp: , Rfl:  .  sildenafil (REVATIO) 20 MG tablet, Take 20 mg by mouth daily as needed., Disp: , Rfl:  .  simvastatin (ZOCOR) 40 MG tablet, Take 20 mg by mouth daily., Disp: , Rfl:  .  timolol (BETIMOL) 0.5 % ophthalmic solution, Place 2 drops into the left eye daily. , Disp: , Rfl:   Vitals:   09/26/15 0944 09/26/15 0950  BP: (!) 146/83 (!) 146/89  Pulse: (!) 53 (!) 53  Resp: 16   Temp: 97.3 F (36.3 C)   SpO2: 98%   Weight: 167 lb (75.8 kg)   Height: 6\' 1"  (1.854 m)     Body mass index is 22.03 kg/m.          Review of Systems Denies chest pain, hemoptysis, dyspnea on exertion, claudication.    Objective:   Physical Exam BP (!) 146/89 (  BP Location: Right Arm, Patient Position: Sitting, Cuff Size: Normal)   Pulse (!) 53   Temp 97.3 F (36.3 C)   Resp 16   Ht 6\' 1"  (1.854 m)   Wt 167 lb (75.8 kg)   SpO2 98%   BMI 22.03 kg/m   Gen. well-developed well-nourished male no apparent distress alert and oriented 3 Lungs no rhonchi or wheezing Cardiovascular regular rhythm no murmurs Left leg with mild ecchymosis medial thigh over great saphenous vein. Mild tenderness to deep palpation. No distal edema noted. Prominent varicosities below the knee over the great saphenous system.  Today I ordered a venous duplex exam the left leg which I reviewed and interpreted. There is no DVT. There is closure of the left great saphenous vein from the distal thigh to near the saphenofemoral junction.     Assessment:     Successful  laser ablation left great saphenous vein for gross reflux with painful varicosities    Plan:     Return in 3 months to evaluate for possible stab phlebectomy of residual painful varicosities in left calf

## 2015-09-27 ENCOUNTER — Other Ambulatory Visit: Payer: Self-pay | Admitting: Cardiovascular Disease

## 2015-09-28 NOTE — Telephone Encounter (Signed)
Review for refill, Thank you. 

## 2015-11-01 ENCOUNTER — Other Ambulatory Visit: Payer: Self-pay | Admitting: Cardiovascular Disease

## 2015-11-01 NOTE — Telephone Encounter (Signed)
Review for refill. 

## 2015-11-02 ENCOUNTER — Other Ambulatory Visit: Payer: Self-pay | Admitting: Cardiovascular Disease

## 2015-11-02 ENCOUNTER — Other Ambulatory Visit: Payer: Self-pay

## 2015-11-02 MED ORDER — CARVEDILOL 12.5 MG PO TABS: 12.5000 mg | ORAL_TABLET | Freq: Two times a day (BID) | ORAL | 2 refills | Status: DC

## 2015-11-02 NOTE — Telephone Encounter (Signed)
Review for refill. 

## 2015-12-02 LAB — GLUCOSE, POCT (MANUAL RESULT ENTRY): POC GLUCOSE: 126 mg/dL — AB (ref 70–99)

## 2015-12-26 ENCOUNTER — Ambulatory Visit: Payer: Medicare Other | Admitting: Vascular Surgery

## 2016-01-31 ENCOUNTER — Other Ambulatory Visit: Payer: Self-pay | Admitting: Cardiovascular Disease

## 2016-01-31 NOTE — Telephone Encounter (Signed)
Please review for refill. Thanks!  

## 2016-01-31 NOTE — Telephone Encounter (Signed)
Rx(s) sent to pharmacy electronically.  

## 2016-03-05 ENCOUNTER — Telehealth: Payer: Self-pay | Admitting: Cardiovascular Disease

## 2016-03-05 MED ORDER — CARVEDILOL 12.5 MG PO TABS: 12.5000 mg | ORAL_TABLET | Freq: Two times a day (BID) | ORAL | 0 refills | Status: DC

## 2016-03-05 NOTE — Telephone Encounter (Signed)
Follow up    Pt wife is calling. She said she had a missed call while she was on a call with another Dr. Gabriel Carina.

## 2016-03-05 NOTE — Telephone Encounter (Signed)
Wife calling to get pt carvedilol refill to new pharmacy. Requested a 90 day supply. Informed her pt overdue for appt, I could provide 30 days but he would need to be seen in office again soon. Scheduled to be seen in office next week at her request. No further needs identified at this time.

## 2016-03-05 NOTE — Telephone Encounter (Signed)
New message  Pt wife call requesting to speak with RN. Pt wife would like to know, when pt switch pharmacies from [facility] to [facility] why did the prescription for Carvedilol not follow his other prescriptions. Pt when states the pharmacy has no information on pt taking this medication. Please call back to discuss

## 2016-03-13 ENCOUNTER — Other Ambulatory Visit: Payer: Self-pay

## 2016-03-13 DIAGNOSIS — I10 Essential (primary) hypertension: Secondary | ICD-10-CM

## 2016-03-13 MED ORDER — LISINOPRIL 10 MG PO TABS: 10.0000 mg | ORAL_TABLET | Freq: Every morning | ORAL | 1 refills | Status: DC

## 2016-03-13 MED ORDER — LISINOPRIL 20 MG PO TABS: 20.0000 mg | ORAL_TABLET | Freq: Every evening | ORAL | 1 refills | Status: DC

## 2016-03-13 MED ORDER — CARVEDILOL 12.5 MG PO TABS: 12.5000 mg | ORAL_TABLET | Freq: Two times a day (BID) | ORAL | 1 refills | Status: DC

## 2016-03-13 NOTE — Telephone Encounter (Signed)
Rx(s) sent to pharmacy electronically.  

## 2016-03-15 ENCOUNTER — Encounter: Payer: Self-pay | Admitting: Cardiovascular Disease

## 2016-03-15 ENCOUNTER — Ambulatory Visit (INDEPENDENT_AMBULATORY_CARE_PROVIDER_SITE_OTHER): Payer: Medicare HMO | Admitting: Cardiovascular Disease

## 2016-03-15 VITALS — BP 157/89 | HR 63 | Ht 73.0 in | Wt 169.4 lb

## 2016-03-15 DIAGNOSIS — I10 Essential (primary) hypertension: Secondary | ICD-10-CM | POA: Diagnosis not present

## 2016-03-15 DIAGNOSIS — E7800 Pure hypercholesterolemia, unspecified: Secondary | ICD-10-CM | POA: Diagnosis not present

## 2016-03-15 DIAGNOSIS — R109 Unspecified abdominal pain: Secondary | ICD-10-CM | POA: Diagnosis not present

## 2016-03-15 DIAGNOSIS — R1010 Upper abdominal pain, unspecified: Secondary | ICD-10-CM

## 2016-03-15 DIAGNOSIS — Z951 Presence of aortocoronary bypass graft: Secondary | ICD-10-CM

## 2016-03-15 LAB — CBC WITH DIFFERENTIAL/PLATELET
Basophils Absolute: 0 cells/uL (ref 0–200)
Basophils Relative: 0 %
EOS PCT: 1 %
Eosinophils Absolute: 152 cells/uL (ref 15–500)
HCT: 44.7 % (ref 38.5–50.0)
Hemoglobin: 15.3 g/dL (ref 13.2–17.1)
LYMPHS PCT: 6 %
Lymphs Abs: 912 cells/uL (ref 850–3900)
MCH: 32.6 pg (ref 27.0–33.0)
MCHC: 34.2 g/dL (ref 32.0–36.0)
MCV: 95.1 fL (ref 80.0–100.0)
MONOS PCT: 13 %
MPV: 10.8 fL (ref 7.5–12.5)
Monocytes Absolute: 1976 cells/uL — ABNORMAL HIGH (ref 200–950)
NEUTROS PCT: 80 %
Neutro Abs: 12160 cells/uL — ABNORMAL HIGH (ref 1500–7800)
Platelets: 225 10*3/uL (ref 140–400)
RBC: 4.7 MIL/uL (ref 4.20–5.80)
RDW: 13.1 % (ref 11.0–15.0)
WBC: 15.2 10*3/uL — AB (ref 3.8–10.8)

## 2016-03-15 LAB — COMPREHENSIVE METABOLIC PANEL
ALT: 15 U/L (ref 9–46)
AST: 15 U/L (ref 10–35)
Albumin: 4.2 g/dL (ref 3.6–5.1)
Alkaline Phosphatase: 66 U/L (ref 40–115)
BILIRUBIN TOTAL: 1.3 mg/dL — AB (ref 0.2–1.2)
BUN: 25 mg/dL (ref 7–25)
CHLORIDE: 104 mmol/L (ref 98–110)
CO2: 29 mmol/L (ref 20–31)
Calcium: 9.8 mg/dL (ref 8.6–10.3)
Creat: 1.02 mg/dL (ref 0.70–1.18)
GLUCOSE: 108 mg/dL — AB (ref 65–99)
Potassium: 4.8 mmol/L (ref 3.5–5.3)
SODIUM: 140 mmol/L (ref 135–146)
Total Protein: 7.1 g/dL (ref 6.1–8.1)

## 2016-03-15 MED ORDER — LISINOPRIL 20 MG PO TABS: 20.0000 mg | ORAL_TABLET | Freq: Two times a day (BID) | ORAL | 3 refills | Status: DC

## 2016-03-15 NOTE — Progress Notes (Signed)
8   Cardiology Office Note   Date:  03/15/2016   ID:  [name], [name] 08-28-1940, MRN [id number]  PCP:  [name], MD  Cardiologist:   [name], MD  CT Surgeon: [name]  Chief Complaint  Patient presents with  . Follow-up    Patient ID: [name] [name] is a 76 y.o. male with hypertension, CAD s/p CABG (LIMA-->LAD, SVG-->RI/OM1, SVG_>PDA), aortic stenosis s/p AVR, bradycardia, and syncope who presents for follow up.  History of Present Illness:  [name] underwent 4 vessel CABG and AVR (25 mm [facility] Ease pericardial tissue valve) on 09/28/14.  His post-operative course was complicated by atrial fibrillation/flutter, but he was discharged in sinus rhythm. He completed 24 sessions of cardiac rehab.  At his last appointment on 03/31/15 he was started on amlodipine due to poorly-controlled blood pressure.  He was referred to Dr. Kellie Simmering due to varicose veins and underwent laser ablation 08/2015.  His varicose veins have been much better than that time. His blood pressure has been labile so at his last appointment lisinopril was split to 10 mg in the AM and 20 mg in the evening.  He had an episode of lightheadedness in August and was seen in the ED.  He was noted to have acute renal failure and was felt to be dehydrated. He had been working in the yard for several hours the day before and was not well hydrated. He followed up with EP and his renal function had improved to baseline. Amlodipine was discontinued at that time. He has been keeping a log of his blood pressure saturations ranged from [id number] systolic over [id number] diastolic. He has been feeling generally well has not been getting much exercise.  For the last 3 days [name] has noted abdominal pain. He notes soreness off and on in his epigastrium. There is associated nausea and emesis. He denies constipation or diarrhea. He hasn't noted any melena or hematochezia.  He denies fever or chills.  He reports drinking 3  beers the night before the symptoms started. He denies lower extremity edema, orthopnea, or PND. He also hasn't noted any chest pain or shortness of breath.   Past Medical History:  Diagnosis Date  . Cataracts, bilateral   . GERD (gastroesophageal reflux disease)   . Glaucoma   . Hyperlipidemia   . Hypertension   . Shingles   . Varicose veins 03/31/2015    Past Surgical History:  Procedure Laterality Date  . AORTIC VALVE REPLACEMENT N/A 09/28/2014   Procedure: AORTIC VALVE REPLACEMENT (AVR);  Surgeon: [name], MD;  Location: [place];  Service: Open Heart Surgery;  Laterality: N/A;  . CARDIAC CATHETERIZATION N/A 09/23/2014   Procedure: Left Heart Cath and Coronary Angiography;  Surgeon: [name], MD;  Location: [place] [facility];  Service: Cardiovascular;  Laterality: N/A;  . CATARACT EXTRACTION    . CORONARY ARTERY BYPASS GRAFT N/A 09/28/2014   Procedure: CORONARY ARTERY BYPASS GRAFTING (CABG) x4 using left internal mammory artery and right saphenous leg vein harvested endoscopically.;  Surgeon: [name], MD;  Location: [place];  Service: Open Heart Surgery;  Laterality: N/A;  . PROSTATECTOMY    . TEE WITHOUT CARDIOVERSION N/A 09/28/2014   Procedure: TRANSESOPHAGEAL ECHOCARDIOGRAM (TEE);  Surgeon: [name], MD;  Location: [place];  Service: Open Heart Surgery;  Laterality: N/A;     Current Outpatient Prescriptions  Medication Sig Dispense Refill  . acetaminophen (TYLENOL) 500 MG tablet Take 500 mg  by mouth every 6 (six) hours as needed.    Marland Kitchen acyclovir (ZOVIRAX) 800 MG tablet Take 400 mg by mouth 2 (two) times daily.    Marland Kitchen aspirin EC 81 MG tablet Take 1 tablet (81 mg total) by mouth daily. 90 tablet 3  . carvedilol (COREG) 12.5 MG tablet Take 1 tablet (12.5 mg total) by mouth 2 (two) times daily. Please keep appointment. 180 tablet 1  . esomeprazole (NEXIUM) 20 MG capsule Take 20 mg by mouth daily before breakfast. Reported on 03/01/2015    . lisinopril  (PRINIVIL,ZESTRIL) 20 MG tablet Take 1 tablet (20 mg total) by mouth 2 (two) times daily. 180 tablet 3  . meloxicam (MOBIC) 15 MG tablet Take 15 mg by mouth daily.    . Multiple Vitamins-Minerals (MULTIVITAMIN WITH MINERALS) tablet Take 1 tablet by mouth daily.    Marland Kitchen oxybutynin (DITROPAN-XL) 5 MG 24 hr tablet Take 5 mg by mouth at bedtime.    . prednisoLONE acetate (PRED FORTE) 1 % ophthalmic suspension Place 1 drop into the left eye daily.    . simvastatin (ZOCOR) 40 MG tablet Take 20 mg by mouth daily.    . timolol (BETIMOL) 0.5 % ophthalmic solution Place 2 drops into the left eye daily.      No current facility-administered medications for this visit.     Allergies:   Hydrochlorothiazide    Social History:  The patient  reports that he quit smoking about 36 years ago. He has never used smokeless tobacco. He reports that he drinks alcohol. He reports that he does not use drugs.   Family History:  The patient's family history includes Ovarian cancer in his mother.    ROS:  Please see the history of present illness.   Otherwise, review of systems are positive for none.   All other systems are reviewed and negative.    PHYSICAL EXAM: VS:  BP (!) 157/89   Pulse 63   Ht 6\' 1"  (1.854 m)   Wt 76.8 kg (169 lb 6.4 oz)   BMI 22.35 kg/m  , BMI Body mass index is 22.35 kg/m. GENERAL:  Well appearing HEENT:  Pupils equal round and reactive, fundi not visualized, oral mucosa unremarkable NECK:  No jugular venous distention, waveform within normal limits, carotid upstroke brisk and symmetric, no bruits LYMPHATICS:  No cervical adenopathy LUNGS:  Clear to auscultation bilaterally HEART:  RRR.  PMI not displaced or sustained,S1 and S2 within normal limits, no S3, no S4, no clicks, no rubs, II/VI sysotlic murmur at the RUSB. ABD:  Flat.  Epigastric and LUQ TTP.  Positive bowel sounds normal in frequency in pitch, no bruits, no rebound, no guarding, no midline pulsatile mass, no hepatomegaly, no  splenomegaly EXT:  2 plus pulses throughout, no edema, no cyanosis no clubbing.  Mildly tender, soft varicose veins on the left lower extremity. SKIN:  No rashes no nodules NEURO:  Cranial nerves II through XII grossly intact, motor grossly intact throughout PSYCH:  Cognitively intact, oriented to person place and time  EKG:  EKG is ordered today.   EKG 07/03/15 reveals sinus bradycardia 81 bpm. First degree AV block. 03/14/16: Sinus rhythm. Rate 73 bpm left anterior fascicular block.  Recent Labs: 09/10/2015: ALT 17; BUN 38; Creatinine, Ser 1.46; Hemoglobin 14.9; Platelets 181; Potassium 4.8; Sodium 132    Lipid Panel No results found for: CHOL, TRIG, HDL, CHOLHDL, VLDL, LDLCALC, LDLDIRECT    Wt Readings from Last 3 Encounters:  03/15/16 76.8 kg (169 lb  6.4 oz)  09/26/15 75.8 kg (167 lb)  09/18/15 74.8 kg (165 lb)    Echo 11/01/14: Study Conclusions  - Left ventricle: The cavity size was normal. Wall thickness was increased in a pattern of moderate LVH. Systolic function was normal. The estimated ejection fraction was in the range of 55% to 60%. Wall motion was normal; there were no regional wall motion abnormalities. - Aortic valve: Normal appearing tissue AVR with no peri valvular regurgitation. - Mitral valve: There was mild regurgitation. - Left atrium: The atrium was mildly dilated. - Atrial septum: No defect or patent foramen ovale was identified.  ASSESSMENT AND PLAN:  # Hypertensive heart disease: BP is above goal.  He is also struggled with low blood pressures. We will increase his lisinopril to 20 mg twice daily. He will continue to monitor his blood pressures and bring it to his follow-up appointment. Repeat basic metabolic panel at that time.  # Abdominal reports epigastric and right upper quadrant abdominal pain. We will check a conference of metabolic panel, CBC, and lipase.pain: Mr. Noguera   # Hyperlipidemia: Managed by PCP.  Goal <70 if possible. He  reports that this was checked recently. Continue simvastatin.  # CAD: S/p CABG and doing well.  Continue aspirin, carvedilol and simvastatin.    # s/p AVR: Mr. Pennino has a bioprosthetic AVR and is doing well.  He was reminded of the need for antibiotic prophylaxis prior to dental procedures.  Current medicines are reviewed at length with the patient today.  The patient does not have concerns regarding medicines.  The following changes have been made:  Increase lisinopril to 20 mg bid.   Labs/ tests ordered today include:   Orders Placed This Encounter  Procedures  . CBC with Differential/Platelet  . Comprehensive metabolic panel  . Lipase  . EKG 12-Lead     Disposition:   FU with Airyana Sprunger C. Oval Linsey, MD in 1 month.  Signed, [name], MD  03/15/2016 2:44 PM    [place]

## 2016-03-15 NOTE — Patient Instructions (Signed)
Medication Instructions:  INCREASE YOUR LISINOPRIL TO 20 MG TWICE A DAY   Labwork: CMET/LIPASE/CBC AT [facility] ON THE FIRST FLOOR  Testing/Procedures: NONE  Follow-Up: Your physician recommends that you schedule a follow-up appointment in: 1 MONTH OV  If you need a refill on your cardiac medications before your next appointment, please call your pharmacy.

## 2016-03-16 LAB — LIPASE: Lipase: 10 U/L (ref 7–60)

## 2016-03-19 DIAGNOSIS — D224 Melanocytic nevi of scalp and neck: Secondary | ICD-10-CM | POA: Diagnosis not present

## 2016-03-19 DIAGNOSIS — D1801 Hemangioma of skin and subcutaneous tissue: Secondary | ICD-10-CM | POA: Diagnosis not present

## 2016-03-19 DIAGNOSIS — L814 Other melanin hyperpigmentation: Secondary | ICD-10-CM | POA: Diagnosis not present

## 2016-03-19 DIAGNOSIS — L570 Actinic keratosis: Secondary | ICD-10-CM | POA: Diagnosis not present

## 2016-03-19 DIAGNOSIS — D485 Neoplasm of uncertain behavior of skin: Secondary | ICD-10-CM | POA: Diagnosis not present

## 2016-03-19 DIAGNOSIS — L821 Other seborrheic keratosis: Secondary | ICD-10-CM | POA: Diagnosis not present

## 2016-03-20 DIAGNOSIS — H4052X4 Glaucoma secondary to other eye disorders, left eye, indeterminate stage: Secondary | ICD-10-CM | POA: Diagnosis not present

## 2016-04-17 ENCOUNTER — Ambulatory Visit (INDEPENDENT_AMBULATORY_CARE_PROVIDER_SITE_OTHER): Payer: Medicare HMO | Admitting: Cardiovascular Disease

## 2016-04-17 ENCOUNTER — Encounter: Payer: Self-pay | Admitting: Cardiovascular Disease

## 2016-04-17 VITALS — BP 164/95 | HR 55 | Ht 73.0 in | Wt 172.4 lb

## 2016-04-17 DIAGNOSIS — Z5181 Encounter for therapeutic drug level monitoring: Secondary | ICD-10-CM | POA: Diagnosis not present

## 2016-04-17 DIAGNOSIS — E7800 Pure hypercholesterolemia, unspecified: Secondary | ICD-10-CM

## 2016-04-17 DIAGNOSIS — Z951 Presence of aortocoronary bypass graft: Secondary | ICD-10-CM | POA: Diagnosis not present

## 2016-04-17 DIAGNOSIS — I10 Essential (primary) hypertension: Secondary | ICD-10-CM | POA: Diagnosis not present

## 2016-04-17 LAB — BASIC METABOLIC PANEL
BUN: 28 mg/dL — ABNORMAL HIGH (ref 7–25)
CO2: 26 mmol/L (ref 20–31)
Calcium: 9.8 mg/dL (ref 8.6–10.3)
Chloride: 104 mmol/L (ref 98–110)
Creat: 0.86 mg/dL (ref 0.70–1.18)
GLUCOSE: 91 mg/dL (ref 65–99)
Potassium: 5.2 mmol/L (ref 3.5–5.3)
SODIUM: 138 mmol/L (ref 135–146)

## 2016-04-17 MED ORDER — CARVEDILOL 6.25 MG PO TABS: 6.2500 mg | ORAL_TABLET | Freq: Two times a day (BID) | ORAL | 5 refills | Status: DC

## 2016-04-17 MED ORDER — AMLODIPINE BESYLATE 2.5 MG PO TABS: 2.5000 mg | ORAL_TABLET | Freq: Every day | ORAL | 5 refills | Status: DC

## 2016-04-17 MED ORDER — ATORVASTATIN CALCIUM 40 MG PO TABS: 40.0000 mg | ORAL_TABLET | Freq: Every day | ORAL | 5 refills | Status: DC

## 2016-04-17 NOTE — Patient Instructions (Addendum)
Medication Instructions:  DECREASE YOUR CARVEDILOL TO 6.25 MG TWICE A DAY  START AMLODIPINE 2.5 MG DAILY   STOP SIMVASTATIN   START ATORVASTATIN 40 MG DAILY    Labwork: BMET AT [facility] ON THE FIRST FLORR  Testing/Procedures: NONE  Follow-Up: Your physician recommends that you schedule a follow-up appointment in: [place]  If you need a refill on your cardiac medications before your next appointment, please call your pharmacy.

## 2016-04-17 NOTE — Progress Notes (Signed)
8   Cardiology Office Note   Date:  04/19/2016   ID:  [name] [name] [birth year], MRN [id number]  PCP:  [name], MD  Cardiologist:   [name], MD  CT Surgeon: [name]  Chief Complaint  Patient presents with  . Follow-up    1 month; Pt states no Sx.      Patient ID: [name] [name] is a 76 y.o. male with hypertension, CAD s/p CABG (LIMA-->LAD, SVG-->RI/OM1, SVG-->PDA), aortic stenosis s/p AVR, bradycardia, and syncope who presents for follow up.  History of Present Illness:  [name] [name] underwent 4 vessel CABG and AVR (25 mm [facility] Ease pericardial tissue valve) on 09/28/14.  His post-operative course was complicated by atrial fibrillation/flutter, but he was discharged in sinus rhythm. He completed 24 sessions of cardiac rehab.  He had an episode of lightheadedness in August and was seen in the ED.  He was noted to have acute renal failure and was felt to be dehydrated. He had been working in the yard for several hours the day before and was not well hydrated. He followed up with EP and his renal function had improved to baseline.Since that time he has struggled with elevated blood pressure and was started on amlodipine.  At his last appointment lisinopril was increased to 20 mg bid.   Since his last appointment [name] [name] has been doing well. He denies any chest pain or pressure. He also has not noted any lower extremity edema, orthopnea, or PND. He notes occasional lightheadedness, most commonly when he changes positions. He denies syncope but felt lightheaded week ago when someone was talking about their medical condition. His blood pressures at home have ranged from 105-147/50 680s. His heart rate has ranged from 47-72.  [name] [name] has not been exercising.   Past Medical History:  Diagnosis Date  . Cataracts, bilateral   . GERD (gastroesophageal reflux disease)   . Glaucoma   . Hyperlipidemia   . Hypertension   . Shingles   . Varicose veins 03/31/2015     Past Surgical History:  Procedure Laterality Date  . AORTIC VALVE REPLACEMENT N/A 09/28/2014   Procedure: AORTIC VALVE REPLACEMENT (AVR);  Surgeon: [name], MD;  Location: [place];  Service: Open Heart Surgery;  Laterality: N/A;  . CARDIAC CATHETERIZATION N/A 09/23/2014   Procedure: Left Heart Cath and Coronary Angiography;  Surgeon: [name], MD;  Location: [place] CV LAB;  Service: Cardiovascular;  Laterality: N/A;  . CATARACT EXTRACTION    . CORONARY ARTERY BYPASS GRAFT N/A 09/28/2014   Procedure: CORONARY ARTERY BYPASS GRAFTING (CABG) x4 using left internal mammory artery and right saphenous leg vein harvested endoscopically.;  Surgeon: [name], MD;  Location: [place];  Service: Open Heart Surgery;  Laterality: N/A;  . PROSTATECTOMY    . TEE WITHOUT CARDIOVERSION N/A 09/28/2014   Procedure: TRANSESOPHAGEAL ECHOCARDIOGRAM (TEE);  Surgeon: [name], MD;  Location: [place];  Service: Open Heart Surgery;  Laterality: N/A;     Current Outpatient Prescriptions  Medication Sig Dispense Refill  . acetaminophen (TYLENOL) 500 MG tablet Take 500 mg by mouth every 6 (six) hours as needed.    Marland Kitchen acyclovir (ZOVIRAX) 800 MG tablet Take 400 mg by mouth 2 (two) times daily.    Marland Kitchen aspirin EC 81 MG tablet Take 1 tablet (81 mg total) by mouth daily. 90 tablet 3  . carvedilol (COREG) 6.25 MG tablet Take 1 tablet (6.25 mg total) by mouth 2 (two)  times daily. Please keep appointment. 60 tablet 5  . esomeprazole (NEXIUM) 20 MG capsule Take 20 mg by mouth daily before breakfast. Reported on 03/01/2015    . lisinopril (PRINIVIL,ZESTRIL) 20 MG tablet Take 1 tablet (20 mg total) by mouth 2 (two) times daily. 180 tablet 3  . meloxicam (MOBIC) 15 MG tablet Take 15 mg by mouth daily.    . Multiple Vitamins-Minerals (MULTIVITAMIN WITH MINERALS) tablet Take 1 tablet by mouth daily.    Marland Kitchen oxybutynin (DITROPAN-XL) 5 MG 24 hr tablet Take 5 mg by mouth at bedtime.    . prednisoLONE  acetate (PRED FORTE) 1 % ophthalmic suspension Place 1 drop into the left eye daily.    . timolol (BETIMOL) 0.5 % ophthalmic solution Place 2 drops into the left eye daily.     Marland Kitchen amLODipine (NORVASC) 2.5 MG tablet Take 1 tablet (2.5 mg total) by mouth daily. 30 tablet 5  . atorvastatin (LIPITOR) 40 MG tablet Take 1 tablet (40 mg total) by mouth daily. 30 tablet 5   No current facility-administered medications for this visit.     Allergies:   Hydrochlorothiazide    Social History:  The patient  reports that he quit smoking about 36 years ago. He has never used smokeless tobacco. He reports that he drinks alcohol. He reports that he does not use drugs.   Family History:  The patient's family history includes Ovarian cancer in his mother.    ROS:  Please see the history of present illness.   Otherwise, review of systems are positive for none.   All other systems are reviewed and negative.    PHYSICAL EXAM: VS:  BP (!) 164/95   Pulse (!) 55   Ht 6\' 1"  (1.854 m)   Wt 78.2 kg (172 lb 6.4 oz)   BMI 22.75 kg/m  , BMI Body mass index is 22.75 kg/m. GENERAL:  Well appearing HEENT:  Pupils equal round and reactive, fundi not visualized, oral mucosa unremarkable NECK:  No jugular venous distention, waveform within normal limits, carotid upstroke brisk and symmetric, no bruits LYMPHATICS:  No cervical adenopathy LUNGS:  Clear to auscultation bilaterally HEART:  RRR.  PMI not displaced or sustained,S1 and S2 within normal limits, no S3, no S4, no clicks, no rubs, II/VI sysotlic murmur at the RUSB. ABD:  Flat.  Epigastric and LUQ TTP.  Positive bowel sounds normal in frequency in pitch, no bruits, no rebound, no guarding, no midline pulsatile mass, no hepatomegaly, no splenomegaly EXT:  2 plus pulses throughout, no edema, no cyanosis no clubbing.  Mildly tender, soft varicose veins on the left lower extremity. SKIN:  No rashes no nodules NEURO:  Cranial nerves II through XII grossly intact,  motor grossly intact throughout PSYCH:  Cognitively intact, oriented to person place and time  EKG:  EKG is ordered today.   EKG 07/03/15 reveals sinus bradycardia 81 bpm. First degree AV block. 03/14/16: Sinus rhythm. Rate 73 bpm left anterior fascicular block.  Recent Labs: 03/15/2016: ALT 15; Hemoglobin 15.3; Platelets 225 04/17/2016: BUN 28; Creat 0.86; Potassium 5.2; Sodium 138    Lipid Panel No results found for: CHOL, TRIG, HDL, CHOLHDL, VLDL, LDLCALC, LDLDIRECT    Wt Readings from Last 3 Encounters:  04/17/16 78.2 kg (172 lb 6.4 oz)  03/15/16 76.8 kg (169 lb 6.4 oz)  09/26/15 75.8 kg (167 lb)    Echo 11/01/14: Study Conclusions  - Left ventricle: The cavity size was normal. Wall thickness was increased in a pattern of  moderate LVH. Systolic function was normal. The estimated ejection fraction was in the range of 55% to 60%. Wall motion was normal; there were no regional wall motion abnormalities. - Aortic valve: Normal appearing tissue AVR with no peri valvular regurgitation. - Mitral valve: There was mild regurgitation. - Left atrium: The atrium was mildly dilated. - Atrial septum: No defect or patent foramen ovale was identified.  ASSESSMENT AND PLAN:  # Hypertensive heart disease: BP is above goal.  His blood pressure is very labile. He brings a log that shows that his heart rate has also been low. We will reduce carvedilol to 6.25 mg twice daily. We will add amlodipine 2.5 mg daily. Continue lisinopril 20 mg twice a day.  Check BMP today.  # Hyperlipidemia:   Goal <70. He reports that this was checked recently. Given that we are starting amlodipine we will switch his simvastatin to atorvastatin 40 mg daily.  # CAD: S/p CABG and doing well.  Continue aspirin, carvedilol and simvastatin.    # s/p AVR: Mr. Rhine has a bioprosthetic AVR and is doing well.  He needs antibiotic prophylaxis prior to dental procedures.  Current medicines are reviewed at length  with the patient today.  The patient does not have concerns regarding medicines.  The following changes have been made:  Reduce carvedilol to 6.25mg  bid.  Start amlodipine 2.5mg  daily.  Switch simvastatin to atorvastatin.   Labs/ tests ordered today include:   Orders Placed This Encounter  Procedures  . Basic metabolic panel     Disposition:   FU with Mailee Klaas C. Oval Linsey, MD in 1 month.  Signed, [name], MD  04/19/2016 10:11 AM    [place]

## 2016-04-18 ENCOUNTER — Telehealth: Payer: Self-pay | Admitting: Cardiovascular Disease

## 2016-04-18 NOTE — Telephone Encounter (Signed)
Left message to call back  Discussed with Dr Oval Linsey after office visit the potential interaction with Simvastatin 40 mg and Amlodipine 2.5 mg. She recommended patient stop Simvastatin and start Atorvastatin 40 mg daily if patient has never had reaction/intolerance to it

## 2016-04-18 NOTE — Telephone Encounter (Signed)
Patient is returning a call to [place]. Thanks.

## 2016-04-19 NOTE — Telephone Encounter (Signed)
Recommendations given to wife, verbalized understanding

## 2016-05-08 DIAGNOSIS — M545 Low back pain: Secondary | ICD-10-CM | POA: Diagnosis not present

## 2016-05-08 DIAGNOSIS — N182 Chronic kidney disease, stage 2 (mild): Secondary | ICD-10-CM | POA: Diagnosis not present

## 2016-05-08 DIAGNOSIS — Z0000 Encounter for general adult medical examination without abnormal findings: Secondary | ICD-10-CM | POA: Diagnosis not present

## 2016-05-08 DIAGNOSIS — K219 Gastro-esophageal reflux disease without esophagitis: Secondary | ICD-10-CM | POA: Diagnosis not present

## 2016-05-08 DIAGNOSIS — I1310 Hypertensive heart and chronic kidney disease without heart failure, with stage 1 through stage 4 chronic kidney disease, or unspecified chronic kidney disease: Secondary | ICD-10-CM | POA: Diagnosis not present

## 2016-05-08 DIAGNOSIS — E7800 Pure hypercholesterolemia, unspecified: Secondary | ICD-10-CM | POA: Diagnosis not present

## 2016-05-08 DIAGNOSIS — I2510 Atherosclerotic heart disease of native coronary artery without angina pectoris: Secondary | ICD-10-CM | POA: Diagnosis not present

## 2016-05-08 DIAGNOSIS — N529 Male erectile dysfunction, unspecified: Secondary | ICD-10-CM | POA: Diagnosis not present

## 2016-05-08 DIAGNOSIS — I517 Cardiomegaly: Secondary | ICD-10-CM | POA: Diagnosis not present

## 2016-05-31 ENCOUNTER — Encounter: Payer: Self-pay | Admitting: Cardiovascular Disease

## 2016-05-31 ENCOUNTER — Ambulatory Visit (INDEPENDENT_AMBULATORY_CARE_PROVIDER_SITE_OTHER): Payer: Medicare HMO | Admitting: Cardiovascular Disease

## 2016-05-31 VITALS — BP 130/86 | HR 63 | Ht 73.0 in | Wt 171.0 lb

## 2016-05-31 DIAGNOSIS — Z951 Presence of aortocoronary bypass graft: Secondary | ICD-10-CM | POA: Diagnosis not present

## 2016-05-31 DIAGNOSIS — Z952 Presence of prosthetic heart valve: Secondary | ICD-10-CM | POA: Diagnosis not present

## 2016-05-31 DIAGNOSIS — I25110 Atherosclerotic heart disease of native coronary artery with unstable angina pectoris: Secondary | ICD-10-CM | POA: Diagnosis not present

## 2016-05-31 DIAGNOSIS — R001 Bradycardia, unspecified: Secondary | ICD-10-CM

## 2016-05-31 DIAGNOSIS — E7800 Pure hypercholesterolemia, unspecified: Secondary | ICD-10-CM | POA: Diagnosis not present

## 2016-05-31 DIAGNOSIS — I10 Essential (primary) hypertension: Secondary | ICD-10-CM

## 2016-05-31 MED ORDER — AMLODIPINE BESYLATE 5 MG PO TABS: 5.0000 mg | ORAL_TABLET | Freq: Every day | ORAL | 3 refills | Status: DC

## 2016-05-31 MED ORDER — CARVEDILOL 6.25 MG PO TABS
6.2500 mg | ORAL_TABLET | Freq: Two times a day (BID) | ORAL | 3 refills | Status: DC
Start: 2016-05-31 — End: 2017-04-25

## 2016-05-31 NOTE — Patient Instructions (Signed)
Medication Instructions:  INCREASE AMLODIPINE TO 5 MG DAILY  Your physician recommends that you continue ALL OTHER current medications as directed. Please refer to the Current Medication list given to you today.  If you need a refill on your cardiac medications before your next appointment, please call your pharmacy.  Follow-Up: Your physician wants you to follow-up in: 3 MONTHS WITH DR [place]    Thank you for choosing [facility] at [facility]!!

## 2016-05-31 NOTE — Progress Notes (Signed)
8   Cardiology Office Note   Date:  05/31/2016   ID:  [name] [name], DOB [birth year], MRN [id number]  PCP:  [name], MD  Cardiologist:   [name], MD  CT Surgeon: [name]  Chief Complaint  Patient presents with  . 1 month visit    BP still up and down; no other Sx.     Patient ID: [name] [name] is a 76 y.o. male with hypertension, CAD s/p CABG (LIMA-->LAD, SVG-->RI/OM1, SVG-->PDA), aortic stenosis s/p AVR, bradycardia, and syncope who presents for follow up.  History of Present Illness:  [name] [name] underwent 4 vessel CABG and AVR (25 mm [facility] Ease pericardial tissue valve) on 09/28/14.  His post-operative course was complicated by atrial fibrillation/flutter, but he was discharged in sinus rhythm. He completed 24 sessions of cardiac rehab.  He had an episode of lightheadedness in August and was seen in the ED.  He was noted to have acute renal failure and was felt to be dehydrated. He had been working in the yard for several hours the day before and was not well hydrated.  At his last appointment [name] [name]' heart rate was low and his blood pressure was high. Carvedilol was reduced and amlodipine was started.  Due to this change simvastatin was switched to atorvastatin.  He brings a log of his blood pressures and heart rates showing that his blood pressure has been mostly in the 010U to 725D systolic with heart rates in the 50s to 60s. He denies any chest pain or shortness of breath. He also has not noted any lightheadedness, dizziness, palpitations, lower extremity edema, orthopnea, or PND. Overall he has been doing well and reports having recent lab work with his primary care provider.   Past Medical History:  Diagnosis Date  . Cataracts, bilateral   . GERD (gastroesophageal reflux disease)   . Glaucoma   . Hyperlipidemia   . Hypertension   . Shingles   . Varicose veins 03/31/2015    Past Surgical History:  Procedure Laterality Date  . AORTIC VALVE  REPLACEMENT N/A 09/28/2014   Procedure: AORTIC VALVE REPLACEMENT (AVR);  Surgeon: [name], MD;  Location: [place];  Service: Open Heart Surgery;  Laterality: N/A;  . CARDIAC CATHETERIZATION N/A 09/23/2014   Procedure: Left Heart Cath and Coronary Angiography;  Surgeon: [name], MD;  Location: [place] CV LAB;  Service: Cardiovascular;  Laterality: N/A;  . CATARACT EXTRACTION    . CORONARY ARTERY BYPASS GRAFT N/A 09/28/2014   Procedure: CORONARY ARTERY BYPASS GRAFTING (CABG) x4 using left internal mammory artery and right saphenous leg vein harvested endoscopically.;  Surgeon: [name], MD;  Location: [place];  Service: Open Heart Surgery;  Laterality: N/A;  . PROSTATECTOMY    . TEE WITHOUT CARDIOVERSION N/A 09/28/2014   Procedure: TRANSESOPHAGEAL ECHOCARDIOGRAM (TEE);  Surgeon: [name], MD;  Location: [place];  Service: Open Heart Surgery;  Laterality: N/A;     Current Outpatient Prescriptions  Medication Sig Dispense Refill  . acetaminophen (TYLENOL) 500 MG tablet Take 500 mg by mouth every 6 (six) hours as needed.    Marland Kitchen acyclovir (ZOVIRAX) 800 MG tablet Take 400 mg by mouth 2 (two) times daily.    Marland Kitchen amLODipine (NORVASC) 5 MG tablet Take 1 tablet (5 mg total) by mouth daily. 90 tablet 3  . aspirin EC 81 MG tablet Take 1 tablet (81 mg total) by mouth daily. 90 tablet 3  . atorvastatin (LIPITOR) 40  MG tablet Take 1 tablet (40 mg total) by mouth daily. 30 tablet 5  . carvedilol (COREG) 6.25 MG tablet Take 1 tablet (6.25 mg total) by mouth 2 (two) times daily. Please keep appointment. 180 tablet 3  . esomeprazole (NEXIUM) 20 MG capsule Take 20 mg by mouth daily before breakfast. Reported on 03/01/2015    . lisinopril (PRINIVIL,ZESTRIL) 20 MG tablet Take 1 tablet (20 mg total) by mouth 2 (two) times daily. 180 tablet 3  . meloxicam (MOBIC) 15 MG tablet Take 15 mg by mouth daily.    . Multiple Vitamins-Minerals (MULTIVITAMIN WITH MINERALS) tablet Take 1 tablet by  mouth daily.    Marland Kitchen oxybutynin (DITROPAN-XL) 5 MG 24 hr tablet Take 5 mg by mouth at bedtime.    . prednisoLONE acetate (PRED FORTE) 1 % ophthalmic suspension Place 1 drop into the left eye daily.    . timolol (BETIMOL) 0.5 % ophthalmic solution Place 2 drops into the left eye daily.      No current facility-administered medications for this visit.     Allergies:   Hydrochlorothiazide    Social History:  The patient  reports that he quit smoking about 37 years ago. He has never used smokeless tobacco. He reports that he drinks alcohol. He reports that he does not use drugs.   Family History:  The patient's family history includes Ovarian cancer in his mother.    ROS:  Please see the history of present illness.   Otherwise, review of systems are positive for none.   All other systems are reviewed and negative.    PHYSICAL EXAM: VS:  BP 130/86 (BP Location: Left Arm, Patient Position: Sitting, Cuff Size: Normal)   Pulse 63   Ht 6\' 1"  (1.854 m)   Wt 77.6 kg (171 lb)   BMI 22.56 kg/m  , BMI Body mass index is 22.56 kg/m. GENERAL:  Well appearing.  No acute distress HEENT:  HEENT NECK:  No jugular venous distention, waveform within normal limits, carotid upstroke brisk and symmetric, no bruits LYMPHATICS:  No cervical adenopathy LUNGS:  Clear to auscultation bilaterally.  No crackles, rhonchi or wheezes HEART:  RRR.  PMI not displaced or sustained,S1 and S2 within normal limits, no S3, no S4, no clicks, no rubs, II/VI sysotlic murmur at the RUSB. ABD:  Flat.  Epigastric and LUQ TTP.  Positive bowel sounds normal in frequency in pitch, no bruits, no rebound, no guarding, no midline pulsatile mass, no hepatomegaly, no splenomegaly EXT:  2 plus pulses throughout, no edema, no cyanosis no clubbing.  Mildly tender, soft varicose veins on the left lower extremity. SKIN:  No rashes no nodules NEURO:  Cranial nerves II through XII grossly intact, motor grossly intact throughout PSYCH:   Cognitively intact, oriented to person place and time  EKG:  EKG is ordered today.   EKG 07/03/15 reveals sinus bradycardia 81 bpm. First degree AV block. 03/14/16: Sinus rhythm. Rate 73 bpm left anterior fascicular block.  Recent Labs: 03/15/2016: ALT 15; Hemoglobin 15.3; Platelets 225 04/17/2016: BUN 28; Creat 0.86; Potassium 5.2; Sodium 138    Lipid Panel No results found for: CHOL, TRIG, HDL, CHOLHDL, VLDL, LDLCALC, LDLDIRECT    Wt Readings from Last 3 Encounters:  05/31/16 77.6 kg (171 lb)  04/17/16 78.2 kg (172 lb 6.4 oz)  03/15/16 76.8 kg (169 lb 6.4 oz)    Echo 11/01/14: Study Conclusions  - Left ventricle: The cavity size was normal. Wall thickness was increased in a pattern of moderate  LVH. Systolic function was normal. The estimated ejection fraction was in the range of 55% to 60%. Wall motion was normal; there were no regional wall motion abnormalities. - Aortic valve: Normal appearing tissue AVR with no peri valvular regurgitation. - Mitral valve: There was mild regurgitation. - Left atrium: The atrium was mildly dilated. - Atrial septum: No defect or patent foramen ovale was identified.  ASSESSMENT AND PLAN:  # Hypertensive heart disease: BP is slightly elevated today and has been high at home as well. We will increase amlodipine to 5 mg daily. Continue carvedilol and lisinopril.   # Hyperlipidemia:   Goal <70.  Simvastatin was switched to atorvastatin when he started on amlodipine. He recently had lipids checked with his PCP. We will obtain these results.  # CAD: S/p CABG and doing well.  No angina.  Continue aspirin, carvedilol and atorvastatin.   # s/p AVR: [name] [name] has a bioprosthetic AVR and is doing well.  He needs antibiotic prophylaxis prior to dental procedures.  Current medicines are reviewed at length with the patient today.  The patient does not have concerns regarding medicines.  The following changes have been made:  Reduce carvedilol  to 6.25mg  bid.  Start amlodipine 2.5mg  daily.  Switch simvastatin to atorvastatin.   Labs/ tests ordered today include:   No orders of the defined types were placed in this encounter.    Disposition:   FU with Dahlia Nifong C. Oval Linsey, MD in 3 months.  Signed, [name], MD  05/31/2016 2:32 PM    [facility]

## 2016-06-11 ENCOUNTER — Telehealth: Payer: Self-pay | Admitting: Cardiovascular Disease

## 2016-06-11 MED ORDER — ATORVASTATIN CALCIUM 40 MG PO TABS: 40.0000 mg | ORAL_TABLET | Freq: Every day | ORAL | 1 refills | Status: DC

## 2016-06-11 NOTE — Telephone Encounter (Signed)
Returned call to [place] at [place].  States he faxed over a refill request for atorvastatin as patient is requesting to get this medication through them now.  New rx sent electronically to [facility].

## 2016-06-11 NOTE — Telephone Encounter (Signed)
[place] voiced they sent over a fax and calling to see if we have received it.Marland KitchenMarland KitchenFax sent on [phone]. Please call back to confirm.

## 2016-06-14 ENCOUNTER — Other Ambulatory Visit: Payer: Self-pay

## 2016-06-14 MED ORDER — ATORVASTATIN CALCIUM 40 MG PO TABS: 40.0000 mg | ORAL_TABLET | Freq: Every day | ORAL | 2 refills | Status: DC

## 2016-07-29 DIAGNOSIS — Z8546 Personal history of malignant neoplasm of prostate: Secondary | ICD-10-CM | POA: Diagnosis not present

## 2016-08-05 DIAGNOSIS — N5201 Erectile dysfunction due to arterial insufficiency: Secondary | ICD-10-CM | POA: Diagnosis not present

## 2016-08-05 DIAGNOSIS — C61 Malignant neoplasm of prostate: Secondary | ICD-10-CM | POA: Diagnosis not present

## 2016-08-05 DIAGNOSIS — R3915 Urgency of urination: Secondary | ICD-10-CM | POA: Diagnosis not present

## 2016-09-04 ENCOUNTER — Encounter: Payer: Self-pay | Admitting: Cardiovascular Disease

## 2016-09-04 ENCOUNTER — Ambulatory Visit (INDEPENDENT_AMBULATORY_CARE_PROVIDER_SITE_OTHER): Payer: Medicare HMO | Admitting: Cardiovascular Disease

## 2016-09-04 VITALS — BP 143/85 | HR 56 | Ht 74.0 in | Wt 173.0 lb

## 2016-09-04 DIAGNOSIS — Z952 Presence of prosthetic heart valve: Secondary | ICD-10-CM

## 2016-09-04 DIAGNOSIS — I119 Hypertensive heart disease without heart failure: Secondary | ICD-10-CM | POA: Diagnosis not present

## 2016-09-04 DIAGNOSIS — E7800 Pure hypercholesterolemia, unspecified: Secondary | ICD-10-CM | POA: Diagnosis not present

## 2016-09-04 DIAGNOSIS — Z951 Presence of aortocoronary bypass graft: Secondary | ICD-10-CM

## 2016-09-04 NOTE — Patient Instructions (Signed)

## 2016-09-04 NOTE — Progress Notes (Signed)
8   Cardiology Office Note   Date:  09/04/2016   ID:  Ranjit, [place] Mar 22, 1940, MRN [id number]  PCP:  [name], MD  Cardiologist:   [name], MD  CT Surgeon: [name]  No chief complaint on file.   Patient ID: [name] [name] is a 76 y.o. male with hypertension, CAD s/p CABG (LIMA-->LAD, SVG-->RI/OM1, SVG-->PDA), aortic stenosis s/p AVR, bradycardia, and syncope who presents for follow up.  History of Present Illness:  [name] [name] underwent 4 vessel CABG and AVR (25 mm [facility] Ease pericardial tissue valve) on 09/28/14.  His post-operative course was complicated by atrial fibrillation/flutter, but he was discharged in sinus rhythm. He completed 24 sessions of cardiac rehab.  He had an episode of lightheadedness in August and was seen in the ED.  He was noted to have acute renal failure and was felt to be dehydrated. He had been working in the yard for several hours the day before and was not well hydrated.  Metoprolol was switched to carvedilol due to bradycardia and hypertension.  He was also started on amlodipine, so simvastatin was switched to atorvastatin.  At his last appointment amlodipine was increased to 5 mg daily.  Brings a log of his blood pressure today that showed a been mostly in the 110s to 120s. The highest recording was 350 systolic. His diastolics have been in the 60s to 80s. He has been feeling well and denies any chest pain or shortness of breath. He has not been exercising lately, which he attributes to laziness. He denies lower extremity edema, orthopnea, or PND. He has a physical scheduled with his primary care next month and plans to have lab work done at that time.   Past Medical History:  Diagnosis Date  . Cataracts, bilateral   . GERD (gastroesophageal reflux disease)   . Glaucoma   . Hyperlipidemia   . Hypertension   . Shingles   . Varicose veins 03/31/2015    Past Surgical History:  Procedure Laterality Date  . AORTIC VALVE  REPLACEMENT N/A 09/28/2014   Procedure: AORTIC VALVE REPLACEMENT (AVR);  Surgeon: [name], MD;  Location: [place];  Service: Open Heart Surgery;  Laterality: N/A;  . CARDIAC CATHETERIZATION N/A 09/23/2014   Procedure: Left Heart Cath and Coronary Angiography;  Surgeon: [name], MD;  Location: [place] CV LAB;  Service: Cardiovascular;  Laterality: N/A;  . CATARACT EXTRACTION    . CORONARY ARTERY BYPASS GRAFT N/A 09/28/2014   Procedure: CORONARY ARTERY BYPASS GRAFTING (CABG) x4 using left internal mammory artery and right saphenous leg vein harvested endoscopically.;  Surgeon: [name], MD;  Location: [place];  Service: Open Heart Surgery;  Laterality: N/A;  . PROSTATECTOMY    . TEE WITHOUT CARDIOVERSION N/A 09/28/2014   Procedure: TRANSESOPHAGEAL ECHOCARDIOGRAM (TEE);  Surgeon: [name], MD;  Location: [place];  Service: Open Heart Surgery;  Laterality: N/A;     Current Outpatient Prescriptions  Medication Sig Dispense Refill  . acetaminophen (TYLENOL) 500 MG tablet Take 500 mg by mouth every 6 (six) hours as needed.    Marland Kitchen acyclovir (ZOVIRAX) 800 MG tablet Take 400 mg by mouth 2 (two) times daily.    Marland Kitchen aspirin EC 81 MG tablet Take 1 tablet (81 mg total) by mouth daily. 90 tablet 3  . atorvastatin (LIPITOR) 40 MG tablet Take 1 tablet (40 mg total) by mouth daily. 90 tablet 2  . carvedilol (COREG) 6.25 MG tablet Take 1  tablet (6.25 mg total) by mouth 2 (two) times daily. Please keep appointment. 180 tablet 3  . esomeprazole (NEXIUM) 20 MG capsule Take 20 mg by mouth daily before breakfast. Reported on 03/01/2015    . lisinopril (PRINIVIL,ZESTRIL) 20 MG tablet Take 1 tablet (20 mg total) by mouth 2 (two) times daily. 180 tablet 3  . meloxicam (MOBIC) 15 MG tablet Take 15 mg by mouth daily.    . Multiple Vitamins-Minerals (MULTIVITAMIN WITH MINERALS) tablet Take 1 tablet by mouth daily.    Marland Kitchen oxybutynin (DITROPAN-XL) 5 MG 24 hr tablet Take 5 mg by mouth at bedtime.     . prednisoLONE acetate (PRED FORTE) 1 % ophthalmic suspension Place 1 drop into the left eye daily.    . timolol (BETIMOL) 0.5 % ophthalmic solution Place 2 drops into the left eye daily.     Marland Kitchen amLODipine (NORVASC) 5 MG tablet Take 1 tablet (5 mg total) by mouth daily. 90 tablet 3   No current facility-administered medications for this visit.     Allergies:   Hydrochlorothiazide    Social History:  The patient  reports that he quit smoking about 37 years ago. He has never used smokeless tobacco. He reports that he drinks alcohol. He reports that he does not use drugs.   Family History:  The patient's family history includes Ovarian cancer in his mother.    ROS:  Please see the history of present illness.   Otherwise, review of systems are positive for none.   All other systems are reviewed and negative.    PHYSICAL EXAM: VS:  BP (!) 143/85   Pulse (!) 56   Ht 6\' 2"  (1.88 m)   Wt 78.5 kg (173 lb)   BMI 22.21 kg/m  , BMI Body mass index is 22.21 kg/m. GENERAL:  Well appearing.  No acute distress HEENT:  [place]/AT.  PERRLA.   NECK:  No jugular venous distention, waveform within normal limits, carotid upstroke brisk and symmetric, no bruits LUNGS:  Clear to auscultation bilaterally.   HEART:  RRR.  PMI not displaced or sustained,S1 and S2 within normal limits, no S3, no S4, no clicks, no rubs, II/VI sysotlic murmur at the RUSB. ABD:  Flat.  Epigastric and LUQ TTP.  Positive bowel sounds normal in frequency in pitch, no bruits, no rebound, no guarding, no midline pulsatile mass, no hepatomegaly, no splenomegaly EXT:  2 plus radial, DP, PT pulses bilaterally. No edema, no cyanosis no clubbing.  Mildly tender, soft varicose veins on the left lower extremity. SKIN:  No rashes no nodules NEURO:  Cranial nerves II through XII grossly intact, motor grossly intact throughout PSYCH:  Cognitively intact, oriented to person place and time  EKG:  EKG is not ordered today.   EKG 07/03/15 reveals  sinus bradycardia 81 bpm. First degree AV block. 03/14/16: Sinus rhythm. Rate 73 bpm left anterior fascicular block.  Recent Labs: 03/15/2016: ALT 15; Hemoglobin 15.3; Platelets 225 04/17/2016: BUN 28; Creat 0.86; Potassium 5.2; Sodium 138    Lipid Panel No results found for: CHOL, TRIG, HDL, CHOLHDL, VLDL, LDLCALC, LDLDIRECT    Wt Readings from Last 3 Encounters:  09/04/16 78.5 kg (173 lb)  05/31/16 77.6 kg (171 lb)  04/17/16 78.2 kg (172 lb 6.4 oz)    Echo 11/01/14: Study Conclusions  - Left ventricle: The cavity size was normal. Wall thickness was increased in a pattern of moderate LVH. Systolic function was normal. The estimated ejection fraction was in the range of 55%  to 60%. Wall motion was normal; there were no regional wall motion abnormalities. - Aortic valve: Normal appearing tissue AVR with no peri valvular regurgitation. - Mitral valve: There was mild regurgitation. - Left atrium: The atrium was mildly dilated. - Atrial septum: No defect or patent foramen ovale was identified.  ASSESSMENT AND PLAN:  # Hypertensive heart disease: BP is slightly elevated today.However he has a log of blood pressures at home to have been very well-controlled. We will not make any additional changes. Continue amlodipine, carvedilol, and lisinopril I encouraged him to increase his exercise to at least 30 or 40 minutes most days of the week..    # Hyperlipidemia:   Goal <70.  Simvastatin was switched to atorvastatin when he started on amlodipine. He is due for labs with his PCP next month. He will request that these records are sent to [place] as well.  # CAD: S/p CABG and doing well.  No angina.  Continue aspirin, carvedilol and atorvastatin. Increase exercise as above.  # s/p AVR: [name] [name] has a bioprosthetic AVR and is doing well.  He needs antibiotic prophylaxis prior to dental procedures.  Current medicines are reviewed at length with the patient today.  The patient does not  have concerns regarding medicines.  The following changes have been made:  none  Labs/ tests ordered today include:   No orders of the defined types were placed in this encounter.    Disposition:   FU with Lasalle Abee C. Oval Linsey, MD in 6 months.  Signed, [name], MD  09/04/2016 2:51 PM    [facility]

## 2016-09-05 ENCOUNTER — Ambulatory Visit: Payer: Medicare HMO | Admitting: Cardiovascular Disease

## 2016-09-17 DIAGNOSIS — C44529 Squamous cell carcinoma of skin of other part of trunk: Secondary | ICD-10-CM | POA: Diagnosis not present

## 2016-09-17 DIAGNOSIS — L570 Actinic keratosis: Secondary | ICD-10-CM | POA: Diagnosis not present

## 2016-09-17 DIAGNOSIS — B351 Tinea unguium: Secondary | ICD-10-CM | POA: Diagnosis not present

## 2016-09-17 DIAGNOSIS — D485 Neoplasm of uncertain behavior of skin: Secondary | ICD-10-CM | POA: Diagnosis not present

## 2016-09-17 DIAGNOSIS — D1801 Hemangioma of skin and subcutaneous tissue: Secondary | ICD-10-CM | POA: Diagnosis not present

## 2016-09-17 DIAGNOSIS — L821 Other seborrheic keratosis: Secondary | ICD-10-CM | POA: Diagnosis not present

## 2016-09-17 DIAGNOSIS — L814 Other melanin hyperpigmentation: Secondary | ICD-10-CM | POA: Diagnosis not present

## 2016-09-27 DIAGNOSIS — Z23 Encounter for immunization: Secondary | ICD-10-CM | POA: Diagnosis not present

## 2016-10-07 DIAGNOSIS — H4052X3 Glaucoma secondary to other eye disorders, left eye, severe stage: Secondary | ICD-10-CM | POA: Diagnosis not present

## 2016-10-27 ENCOUNTER — Emergency Department (HOSPITAL_COMMUNITY): Payer: Medicare HMO

## 2016-10-27 ENCOUNTER — Observation Stay (HOSPITAL_COMMUNITY)
Admission: EM | Admit: 2016-10-27 | Discharge: 2016-10-29 | Disposition: A | Discharge status: Home / Self Care | Payer: Medicare HMO | Attending: General Surgery | Admitting: General Surgery

## 2016-10-27 DIAGNOSIS — K8020 Calculus of gallbladder without cholecystitis without obstruction: Secondary | ICD-10-CM | POA: Diagnosis not present

## 2016-10-27 DIAGNOSIS — J9811 Atelectasis: Secondary | ICD-10-CM | POA: Diagnosis not present

## 2016-10-27 DIAGNOSIS — Z951 Presence of aortocoronary bypass graft: Secondary | ICD-10-CM | POA: Insufficient documentation

## 2016-10-27 DIAGNOSIS — I700 Atherosclerosis of aorta: Secondary | ICD-10-CM | POA: Insufficient documentation

## 2016-10-27 DIAGNOSIS — R1012 Left upper quadrant pain: Secondary | ICD-10-CM | POA: Diagnosis not present

## 2016-10-27 DIAGNOSIS — I4891 Unspecified atrial fibrillation: Secondary | ICD-10-CM | POA: Diagnosis not present

## 2016-10-27 DIAGNOSIS — K8000 Calculus of gallbladder with acute cholecystitis without obstruction: Principal | ICD-10-CM | POA: Insufficient documentation

## 2016-10-27 DIAGNOSIS — E785 Hyperlipidemia, unspecified: Secondary | ICD-10-CM | POA: Insufficient documentation

## 2016-10-27 DIAGNOSIS — Z79899 Other long term (current) drug therapy: Secondary | ICD-10-CM | POA: Insufficient documentation

## 2016-10-27 DIAGNOSIS — I2510 Atherosclerotic heart disease of native coronary artery without angina pectoris: Secondary | ICD-10-CM | POA: Insufficient documentation

## 2016-10-27 DIAGNOSIS — Z8619 Personal history of other infectious and parasitic diseases: Secondary | ICD-10-CM | POA: Diagnosis not present

## 2016-10-27 DIAGNOSIS — Z888 Allergy status to other drugs, medicaments and biological substances status: Secondary | ICD-10-CM | POA: Diagnosis not present

## 2016-10-27 DIAGNOSIS — K219 Gastro-esophageal reflux disease without esophagitis: Secondary | ICD-10-CM | POA: Insufficient documentation

## 2016-10-27 DIAGNOSIS — K810 Acute cholecystitis: Secondary | ICD-10-CM | POA: Diagnosis present

## 2016-10-27 DIAGNOSIS — I10 Essential (primary) hypertension: Secondary | ICD-10-CM | POA: Insufficient documentation

## 2016-10-27 DIAGNOSIS — Z87891 Personal history of nicotine dependence: Secondary | ICD-10-CM | POA: Insufficient documentation

## 2016-10-27 DIAGNOSIS — Z7982 Long term (current) use of aspirin: Secondary | ICD-10-CM | POA: Diagnosis not present

## 2016-10-27 DIAGNOSIS — Z952 Presence of prosthetic heart valve: Secondary | ICD-10-CM | POA: Insufficient documentation

## 2016-10-27 DIAGNOSIS — I739 Peripheral vascular disease, unspecified: Secondary | ICD-10-CM | POA: Diagnosis not present

## 2016-10-27 DIAGNOSIS — H409 Unspecified glaucoma: Secondary | ICD-10-CM | POA: Insufficient documentation

## 2016-10-27 DIAGNOSIS — R1011 Right upper quadrant pain: Secondary | ICD-10-CM | POA: Diagnosis not present

## 2016-10-27 LAB — LIPASE, BLOOD: LIPASE: 28 U/L (ref 11–51)

## 2016-10-27 LAB — COMPREHENSIVE METABOLIC PANEL
ALK PHOS: 63 U/L (ref 38–126)
ALT: 19 U/L (ref 17–63)
AST: 21 U/L (ref 15–41)
Albumin: 3.7 g/dL (ref 3.5–5.0)
Anion gap: 8 (ref 5–15)
BILIRUBIN TOTAL: 1.1 mg/dL (ref 0.3–1.2)
BUN: 23 mg/dL — AB (ref 6–20)
CHLORIDE: 106 mmol/L (ref 101–111)
CO2: 22 mmol/L (ref 22–32)
CREATININE: 0.77 mg/dL (ref 0.61–1.24)
Calcium: 9.2 mg/dL (ref 8.9–10.3)
GFR calc Af Amer: >60 mL/min (ref >60)
Glucose, Bld: 135 mg/dL — ABNORMAL HIGH (ref 65–99)
Potassium: 3.7 mmol/L (ref 3.5–5.1)
Sodium: 136 mmol/L (ref 135–145)
Total Protein: 6.4 g/dL — ABNORMAL LOW (ref 6.5–8.1)

## 2016-10-27 LAB — CBC
HCT: 41.4 % (ref 39.0–52.0)
Hemoglobin: 14.2 g/dL (ref 13.0–17.0)
MCH: 32 pg (ref 26.0–34.0)
MCHC: 34.3 g/dL (ref 30.0–36.0)
MCV: 93.2 fL (ref 78.0–100.0)
PLATELETS: 177 10*3/uL (ref 150–400)
RBC: 4.44 MIL/uL (ref 4.22–5.81)
RDW: 12.2 % (ref 11.5–15.5)
WBC: 11.8 10*3/uL — AB (ref 4.0–10.5)

## 2016-10-27 LAB — I-STAT TROPONIN, ED: Troponin i, poc: 0 ng/mL (ref 0.00–0.08)

## 2016-10-27 MED ORDER — FENTANYL CITRATE (PF) 100 MCG/2ML IJ SOLN: 50.0000 ug | Freq: Once | INTRAMUSCULAR | Status: DC

## 2016-10-27 MED ORDER — FENTANYL CITRATE (PF) 100 MCG/2ML IJ SOLN: 50.0000 ug | INTRAMUSCULAR | Status: DC | PRN

## 2016-10-27 MED ORDER — ONDANSETRON HCL 4 MG/2ML IJ SOLN
4.0000 mg | Freq: Four times a day (QID) | INTRAMUSCULAR | Status: DC | PRN
  Administered 2016-10-27: 4 mg via INTRAVENOUS
  Filled 2016-10-27: qty 2

## 2016-10-27 MED ORDER — FENTANYL CITRATE (PF) 100 MCG/2ML IJ SOLN
50.0000 ug | Freq: Once | INTRAMUSCULAR | Status: AC
  Administered 2016-10-27: 50 ug via INTRAVENOUS
  Filled 2016-10-27: qty 2

## 2016-10-27 NOTE — ED Triage Notes (Signed)
Pt felt pain in RUQ starting at 8am today. Able to eat and drink throughout the day but was feeling more discomfort as the day progressed.

## 2016-10-27 NOTE — ED Notes (Signed)
Patient transported to Ultrasound 

## 2016-10-27 NOTE — ED Notes (Signed)
Attempted to assist pt to bathroom b/c he stated he had to have a bm.  Pt became diaphoretic.

## 2016-10-27 NOTE — ED Provider Notes (Signed)
[facility] DEPT Provider Note   CSN: [id number] Arrival date & time: 10/27/16  1844     History   Chief Complaint Chief Complaint  Patient presents with  . Abdominal Pain    HPI [name] [name] is a 76 y.o. male.  76 year old male history of CAD S/P CABG, HTN, GERD who presents with right upper quadrant and epigastric abdominal pain.  Onset this morning after eating breakfast.  Worsen with certain food intake.  Pain now localized in right upper quadrant.  Denies chest pain or shortness of breath.  No recent fevers, cough, dysuria, or infectious symptoms.  Denies previous abdominal surgeries.  The history is provided by the patient, medical records and the spouse. No language interpreter was used.    Past Medical History:  Diagnosis Date  . Cataracts, bilateral   . GERD (gastroesophageal reflux disease)   . Glaucoma   . Hyperlipidemia   . Hypertension   . Shingles   . Varicose veins 03/31/2015    Patient Active Problem List   Diagnosis Date Noted  . Varicose veins of left lower extremity with complications 93/81/8299  . Varicose veins 03/31/2015  . Bradycardia 09/29/2014  . S/P CABG (coronary artery bypass graft)   . S/P CABG x 4 09/28/2014  . Unstable angina pectoris ([place]) 09/23/2014  . Cardiac murmur, previously undiagnosed 09/23/2014  . Coronary artery disease involving native heart with unstable angina pectoris ([place]) 09/23/2014  . Unstable angina ([place]) 09/23/2014  . Syncope and collapse 09/30/2013  . Syncope 09/30/2013  . Essential hypertension, benign 09/30/2013  . GERD (gastroesophageal reflux disease) 09/30/2013    Past Surgical History:  Procedure Laterality Date  . AORTIC VALVE REPLACEMENT N/A 09/28/2014   Procedure: AORTIC VALVE REPLACEMENT (AVR);  Surgeon: [name], MD;  Location: [place];  Service: Open Heart Surgery;  Laterality: N/A;  . CARDIAC CATHETERIZATION N/A 09/23/2014   Procedure: Left Heart Cath and Coronary Angiography;  Surgeon:  [name], MD;  Location: [place] CV LAB;  Service: Cardiovascular;  Laterality: N/A;  . CATARACT EXTRACTION    . CORONARY ARTERY BYPASS GRAFT N/A 09/28/2014   Procedure: CORONARY ARTERY BYPASS GRAFTING (CABG) x4 using left internal mammory artery and right saphenous leg vein harvested endoscopically.;  Surgeon: [name], MD;  Location: [place];  Service: Open Heart Surgery;  Laterality: N/A;  . PROSTATECTOMY    . TEE WITHOUT CARDIOVERSION N/A 09/28/2014   Procedure: TRANSESOPHAGEAL ECHOCARDIOGRAM (TEE);  Surgeon: [name], MD;  Location: [place];  Service: Open Heart Surgery;  Laterality: N/A;       Home Medications    Prior to Admission medications   Medication Sig Start Date End Date Taking? Authorizing Provider  acetaminophen (TYLENOL) 500 MG tablet Take 1,000 mg by mouth every 6 (six) hours as needed for headache (pain).    Yes [provider]  acyclovir (ZOVIRAX) 800 MG tablet Take 400 mg by mouth 2 (two) times daily. Continuous course   Yes [provider]  amLODipine (NORVASC) 5 MG tablet Take 5 mg by mouth daily.   Yes [provider]  aspirin EC 81 MG tablet Take 1 tablet (81 mg total) by mouth daily. Patient taking differently: Take 81 mg by mouth at bedtime.  11/01/14  Yes [name], MD  atorvastatin (LIPITOR) 40 MG tablet Take 40 mg by mouth at bedtime.    Yes [provider]  carvedilol (COREG) 6.25 MG tablet Take 1 tablet (6.25 mg total) by mouth 2 (  two) times daily. Please keep appointment. 05/31/16  Yes [name], MD  esomeprazole (NEXIUM) 20 MG capsule Take 20 mg by mouth every other day. Reported on 03/01/2015   Yes [provider]  lisinopril (PRINIVIL,ZESTRIL) 20 MG tablet Take 1 tablet (20 mg total) by mouth 2 (two) times daily. 03/15/16  Yes [name], MD  meloxicam (MOBIC) 15 MG tablet Take 15 mg by mouth at bedtime.    Yes [provider]  Multiple Vitamins-Minerals  (MULTIVITAMIN WITH MINERALS) tablet Take 1 tablet by mouth daily.   Yes [provider]  oxybutynin (DITROPAN) 5 MG tablet Take 5 mg by mouth at bedtime. 10/22/16  Yes [provider]  prednisoLONE acetate (PRED FORTE) 1 % ophthalmic suspension Place 1 drop into the left eye daily.   Yes [provider]  timolol (TIMOPTIC) 0.5 % ophthalmic solution Place 1 drop into the left eye 2 (two) times daily. 09/30/16  Yes [provider]    Family History Family History  Problem Relation Age of Onset  . Ovarian cancer Mother     Social History Social History  Substance Use Topics  . Smoking status: Former Smoker    Quit date: 05/23/1979  . Smokeless tobacco: Never Used     Comment: quit smoking in 1982  . Alcohol use 0.0 oz/week     Allergies   Hydrochlorothiazide   Review of Systems Review of Systems  Constitutional: Negative for chills and fever.  HENT: Negative for ear pain and sore throat.   Eyes: Negative for pain and visual disturbance.  Respiratory: Negative for cough and shortness of breath.   Cardiovascular: Negative for chest pain and palpitations.  Gastrointestinal: Positive for abdominal pain. Negative for vomiting.  Genitourinary: Negative for dysuria and hematuria.  Musculoskeletal: Negative for arthralgias and back pain.  Skin: Negative for color change and rash.  Neurological: Negative for seizures and syncope.  All other systems reviewed and are negative.    Physical Exam Updated Vital Signs BP 132/79   Pulse 66   Temp 98.1 F (36.7 C) (Oral)   Resp 18   Ht 6\' 1"  (1.854 m)   Wt 77.1 kg (170 lb)   SpO2 98%   BMI 22.43 kg/m   Physical Exam  Constitutional: He appears well-developed and well-nourished. No distress.  HENT:  Head: Normocephalic and atraumatic.  Eyes: Conjunctivae are normal.  Neck: Neck supple.  Cardiovascular: Normal rate and regular rhythm.   No murmur heard. Pulmonary/Chest: Effort normal and breath  sounds normal. No respiratory distress.  Abdominal: Soft. He exhibits no distension and no mass. There is tenderness (Mild RUQ TTP, negative Murphy's). There is no guarding.  Musculoskeletal: He exhibits no edema.  Neurological: He is alert. No cranial nerve deficit. Coordination normal.  5/5 motor strength and intact sensation in all extremities. Intact bilateral finger-to-nose coordination  Skin: Skin is warm and dry.  Nursing note and vitals reviewed.    ED Treatments / Results  Labs (all labs ordered are listed, but only abnormal results are displayed) Labs Reviewed  COMPREHENSIVE METABOLIC PANEL - Abnormal; Notable for the following:       Result Value   Glucose, Bld 135 (*)    BUN 23 (*)    Total Protein 6.4 (*)    All other components within normal limits  CBC - Abnormal; Notable for the following:    WBC 11.8 (*)    All other components within normal limits  LIPASE, BLOOD  URINALYSIS, ROUTINE W REFLEX  MICROSCOPIC  I-STAT TROPONIN, ED    EKG  EKG Interpretation  Date/Time:  Sunday October 27 2016 20:03:30 EDT Ventricular Rate:  78 PR Interval:    QRS Duration: 109 QT Interval:  378 QTC Calculation: 431 R Axis:   -70 Text Interpretation:  Atrial fibrillation Left anterior fascicular block Anterior infarct, old ST elevation, consider inferior injury When compared to prior, no significant changes seen.  No STEMI Confirmed by Tegeler, Chris ([id number]) on 10/27/2016 8:20:45 PM       Radiology Dg Chest 2 View  Result Date: 10/27/2016 CLINICAL DATA:  Right upper quadrant abdominal pain and near syncope x2 days. Hypertension and former smoker. EXAM: CHEST  2 VIEW COMPARISON:  09/10/2015 FINDINGS: Top normal size cardiac silhouette. Aortic atherosclerosis at the arch. Status post CABG. Aortic valvular prosthetic noted. Elevated left hemidiaphragm is stable with atelectasis at the lung bases. No pneumonic consolidation, pulmonary edema or effusion. IMPRESSION: 1. Status post  CABG and AVR. 2. Aortic atherosclerosis and bibasilar atelectasis. 3. No pneumonic consolidation. Electronically Signed   By: David  Kwon M.D.   On: 10/27/2016 21:28   Us Abdomen Limited  Result Date: 10/27/2016 CLINICAL DATA:  Right upper quadrant pain EXAM: ULTRASOUND ABDOMEN LIMITED RIGHT UPPER QUADRANT COMPARISON:  06/13/2005 CT FINDINGS: Gallbladder: Biliary sludge and calculi are noted the largest stone measuring approximately 3.3 cm. Top normal size gallbladder wall thickness at 3 mm. No sonographic Murphy sign per technologist. No pericholecystic fluid. Common bile duct: Diameter:  5.2 mm and within normal limits. Liver: No focal lesion identified. Within normal limits in parenchymal echogenicity. Portal vein is patent on color Doppler imaging with normal direction of blood flow towards the liver. IMPRESSION: Cholelithiasis and biliary sludge without secondary signs of acute cholecystitis. Electronically Signed   By: David  Kwon M.D.   On: 10/27/2016 21:25    Procedures Procedures (including critical care time)  Medications Ordered in ED Medications  fentaNYL (SUBLIMAZE) injection 50 mcg (50 mcg Intravenous Given 10/27/16 2317)     Initial Impression / Assessment and Plan / ED Course  I have reviewed the triage vital signs and the nursing notes.  Pertinent labs & imaging results that were available during my care of the patient were reviewed by me and considered in my medical decision making (see chart for details).     76  yoM h/o CAD s/p CABG and HTN who p/w RUQ and found to have symptomatic cholelithiasis. RUQ u/s showing cholelithiasis and biliary sludge. AF, VSS. TTP localized over RUQ.  EKG showing A. Fib. Troponin undetectable. CBC, CMP, lipase unremarkable. CXR showing no pneumonic consolidation.  RUQ waxing and worsening intermittent, remains at least 7/10. Pain not well controlled after IV fentanyl. Pt admitted to general surgery for further management and evaluation. Pt  stable at time of transfer.  Pt care d/w Dr. Sherry Ruffing  Final Clinical Impressions(s) / ED Diagnoses   Final diagnoses:  Symptomatic cholelithiasis    New Prescriptions New Prescriptions   No medications on file     Payton Emerald, MD 10/28/16 [place], Gwenyth Allegra, MD 10/29/16 1827

## 2016-10-27 NOTE — ED Notes (Signed)
Pt transported with monitor to floor.

## 2016-10-28 ENCOUNTER — Inpatient Hospital Stay (HOSPITAL_COMMUNITY): Payer: Medicare HMO | Admitting: Certified Registered Nurse Anesthetist

## 2016-10-28 ENCOUNTER — Encounter (HOSPITAL_COMMUNITY): Payer: Self-pay | Admitting: Certified Registered Nurse Anesthetist

## 2016-10-28 ENCOUNTER — Encounter (HOSPITAL_COMMUNITY)
Admission: EM | Disposition: A | Discharge status: Home / Self Care | Payer: Self-pay | Source: Home / Self Care | Attending: Emergency Medicine

## 2016-10-28 DIAGNOSIS — K8000 Calculus of gallbladder with acute cholecystitis without obstruction: Secondary | ICD-10-CM | POA: Diagnosis not present

## 2016-10-28 DIAGNOSIS — I700 Atherosclerosis of aorta: Secondary | ICD-10-CM | POA: Diagnosis not present

## 2016-10-28 DIAGNOSIS — R001 Bradycardia, unspecified: Secondary | ICD-10-CM | POA: Diagnosis not present

## 2016-10-28 DIAGNOSIS — H409 Unspecified glaucoma: Secondary | ICD-10-CM | POA: Diagnosis not present

## 2016-10-28 DIAGNOSIS — K8010 Calculus of gallbladder with chronic cholecystitis without obstruction: Secondary | ICD-10-CM | POA: Diagnosis not present

## 2016-10-28 DIAGNOSIS — J9811 Atelectasis: Secondary | ICD-10-CM | POA: Diagnosis not present

## 2016-10-28 DIAGNOSIS — K219 Gastro-esophageal reflux disease without esophagitis: Secondary | ICD-10-CM | POA: Diagnosis not present

## 2016-10-28 DIAGNOSIS — I2510 Atherosclerotic heart disease of native coronary artery without angina pectoris: Secondary | ICD-10-CM | POA: Diagnosis not present

## 2016-10-28 DIAGNOSIS — E785 Hyperlipidemia, unspecified: Secondary | ICD-10-CM | POA: Diagnosis not present

## 2016-10-28 DIAGNOSIS — I10 Essential (primary) hypertension: Secondary | ICD-10-CM | POA: Diagnosis not present

## 2016-10-28 DIAGNOSIS — I739 Peripheral vascular disease, unspecified: Secondary | ICD-10-CM | POA: Diagnosis not present

## 2016-10-28 DIAGNOSIS — K810 Acute cholecystitis: Secondary | ICD-10-CM | POA: Diagnosis present

## 2016-10-28 HISTORY — PX: CHOLECYSTECTOMY: SHX55

## 2016-10-28 LAB — URINALYSIS, ROUTINE W REFLEX MICROSCOPIC
BACTERIA UA: NONE SEEN
Bilirubin Urine: NEGATIVE
Glucose, UA: NEGATIVE mg/dL
HGB URINE DIPSTICK: NEGATIVE
Ketones, ur: 20 mg/dL — AB
LEUKOCYTES UA: NEGATIVE
Nitrite: NEGATIVE
PH: 5 (ref 5.0–8.0)
Protein, ur: 30 mg/dL — AB
SPECIFIC GRAVITY, URINE: 1.027 (ref 1.005–1.030)

## 2016-10-28 LAB — SURGICAL PCR SCREEN
MRSA, PCR: NEGATIVE
Staphylococcus aureus: NEGATIVE

## 2016-10-28 SURGERY — LAPAROSCOPIC CHOLECYSTECTOMY
Anesthesia: General | Site: Abdomen

## 2016-10-28 MED ORDER — BUPIVACAINE HCL 0.25 % IJ SOLN
INTRAMUSCULAR | Status: DC | PRN
  Administered 2016-10-28: 7 mL

## 2016-10-28 MED ORDER — CARVEDILOL 6.25 MG PO TABS
6.2500 mg | ORAL_TABLET | Freq: Two times a day (BID) | ORAL | Status: DC
  Administered 2016-10-28 – 2016-10-29 (×3): 6.25 mg via ORAL
  Filled 2016-10-28 (×3): qty 1

## 2016-10-28 MED ORDER — LIDOCAINE 2% (20 MG/ML) 5 ML SYRINGE
INTRAMUSCULAR | Status: DC | PRN
  Administered 2016-10-28: 40 mg via INTRAVENOUS

## 2016-10-28 MED ORDER — HYDRALAZINE HCL 20 MG/ML IJ SOLN: 10.0000 mg | INTRAMUSCULAR | Status: DC | PRN

## 2016-10-28 MED ORDER — ROCURONIUM BROMIDE 10 MG/ML (PF) SYRINGE
PREFILLED_SYRINGE | INTRAVENOUS | Status: DC | PRN
  Administered 2016-10-28: 50 mg via INTRAVENOUS

## 2016-10-28 MED ORDER — DEXTROSE-NACL 5-0.9 % IV SOLN
INTRAVENOUS | Status: DC
  Administered 2016-10-28 – 2016-10-29 (×4): via INTRAVENOUS

## 2016-10-28 MED ORDER — FENTANYL CITRATE (PF) 100 MCG/2ML IJ SOLN: 25.0000 ug | INTRAMUSCULAR | Status: DC | PRN

## 2016-10-28 MED ORDER — ONDANSETRON HCL 4 MG/2ML IJ SOLN: 4.0000 mg | Freq: Once | INTRAMUSCULAR | Status: DC | PRN

## 2016-10-28 MED ORDER — OXYCODONE-ACETAMINOPHEN 5-325 MG PO TABS
1.0000 | ORAL_TABLET | ORAL | Status: DC | PRN
  Administered 2016-10-28 (×4): 2 via ORAL
  Filled 2016-10-28 (×4): qty 2

## 2016-10-28 MED ORDER — DEXTROSE-NACL 5-0.9 % IV SOLN
INTRAVENOUS | Status: DC
  Administered 2016-10-28: 03:00:00 via INTRAVENOUS

## 2016-10-28 MED ORDER — AMLODIPINE BESYLATE 5 MG PO TABS
5.0000 mg | ORAL_TABLET | Freq: Every day | ORAL | Status: DC
  Administered 2016-10-28 – 2016-10-29 (×2): 5 mg via ORAL
  Filled 2016-10-28 (×2): qty 1

## 2016-10-28 MED ORDER — ATORVASTATIN CALCIUM 40 MG PO TABS: 40.0000 mg | ORAL_TABLET | Freq: Every day | ORAL | Status: DC

## 2016-10-28 MED ORDER — LACTATED RINGERS IV SOLN
INTRAVENOUS | Status: DC | PRN
  Administered 2016-10-28: 08:00:00 via INTRAVENOUS
  Administered 2016-10-28: 10:00:00

## 2016-10-28 MED ORDER — ACYCLOVIR 400 MG PO TABS
400.0000 mg | ORAL_TABLET | Freq: Two times a day (BID) | ORAL | Status: DC
  Administered 2016-10-28 – 2016-10-29 (×3): 400 mg via ORAL
  Filled 2016-10-28 (×4): qty 1

## 2016-10-28 MED ORDER — ONDANSETRON HCL 4 MG/2ML IJ SOLN
INTRAMUSCULAR | Status: DC | PRN
  Administered 2016-10-28: 4 mg via INTRAVENOUS

## 2016-10-28 MED ORDER — SUGAMMADEX SODIUM 200 MG/2ML IV SOLN
INTRAVENOUS | Status: AC
  Filled 2016-10-28: qty 2

## 2016-10-28 MED ORDER — SUCCINYLCHOLINE CHLORIDE 200 MG/10ML IV SOSY
PREFILLED_SYRINGE | INTRAVENOUS | Status: AC
  Filled 2016-10-28: qty 10

## 2016-10-28 MED ORDER — ONDANSETRON 4 MG PO TBDP: 4.0000 mg | ORAL_TABLET | Freq: Four times a day (QID) | ORAL | Status: DC | PRN

## 2016-10-28 MED ORDER — ENOXAPARIN SODIUM 40 MG/0.4ML ~~LOC~~ SOLN: 40.0000 mg | SUBCUTANEOUS | Status: DC

## 2016-10-28 MED ORDER — OXYBUTYNIN CHLORIDE 5 MG PO TABS
5.0000 mg | ORAL_TABLET | Freq: Every day | ORAL | Status: DC
  Administered 2016-10-28: 5 mg via ORAL
  Filled 2016-10-28: qty 1

## 2016-10-28 MED ORDER — ENOXAPARIN SODIUM 40 MG/0.4ML ~~LOC~~ SOLN
40.0000 mg | SUBCUTANEOUS | Status: DC
  Administered 2016-10-28: 40 mg via SUBCUTANEOUS
  Filled 2016-10-28: qty 0.4

## 2016-10-28 MED ORDER — EPHEDRINE 5 MG/ML INJ
INTRAVENOUS | Status: AC
  Filled 2016-10-28: qty 10

## 2016-10-28 MED ORDER — LISINOPRIL 20 MG PO TABS
20.0000 mg | ORAL_TABLET | Freq: Two times a day (BID) | ORAL | Status: DC
  Administered 2016-10-28: 20 mg via ORAL
  Filled 2016-10-28: qty 1

## 2016-10-28 MED ORDER — LIDOCAINE 2% (20 MG/ML) 5 ML SYRINGE
INTRAMUSCULAR | Status: AC
  Filled 2016-10-28: qty 5

## 2016-10-28 MED ORDER — ONDANSETRON HCL 4 MG/2ML IJ SOLN: 4.0000 mg | Freq: Four times a day (QID) | INTRAMUSCULAR | Status: DC | PRN

## 2016-10-28 MED ORDER — DEXAMETHASONE SODIUM PHOSPHATE 10 MG/ML IJ SOLN
INTRAMUSCULAR | Status: AC
  Filled 2016-10-28: qty 1

## 2016-10-28 MED ORDER — FENTANYL CITRATE (PF) 250 MCG/5ML IJ SOLN
INTRAMUSCULAR | Status: AC
  Filled 2016-10-28: qty 5

## 2016-10-28 MED ORDER — DEXAMETHASONE SODIUM PHOSPHATE 10 MG/ML IJ SOLN
INTRAMUSCULAR | Status: DC | PRN
  Administered 2016-10-28: 10 mg via INTRAVENOUS

## 2016-10-28 MED ORDER — HYDROMORPHONE HCL 1 MG/ML IJ SOLN
1.0000 mg | INTRAMUSCULAR | Status: DC | PRN
  Administered 2016-10-28 (×2): 1 mg via INTRAVENOUS
  Filled 2016-10-28 (×2): qty 1

## 2016-10-28 MED ORDER — STERILE WATER FOR IRRIGATION IR SOLN
Status: DC | PRN
  Administered 2016-10-28: 1000 mL

## 2016-10-28 MED ORDER — 0.9 % SODIUM CHLORIDE (POUR BTL) OPTIME
TOPICAL | Status: DC | PRN
  Administered 2016-10-28: 1000 mL

## 2016-10-28 MED ORDER — ONDANSETRON HCL 4 MG/2ML IJ SOLN
INTRAMUSCULAR | Status: AC
  Filled 2016-10-28: qty 2

## 2016-10-28 MED ORDER — IOPAMIDOL (ISOVUE-300) INJECTION 61%
INTRAVENOUS | Status: AC
  Filled 2016-10-28: qty 50

## 2016-10-28 MED ORDER — OXYCODONE HCL 5 MG/5ML PO SOLN: 5.0000 mg | Freq: Once | ORAL | Status: DC | PRN

## 2016-10-28 MED ORDER — OXYCODONE HCL 5 MG PO TABS: 5.0000 mg | ORAL_TABLET | Freq: Once | ORAL | Status: DC | PRN

## 2016-10-28 MED ORDER — TIMOLOL MALEATE 0.5 % OP SOLN
1.0000 [drp] | Freq: Two times a day (BID) | OPHTHALMIC | Status: DC
  Administered 2016-10-28 – 2016-10-29 (×3): 1 [drp] via OPHTHALMIC
  Filled 2016-10-28: qty 5

## 2016-10-28 MED ORDER — PROPOFOL 10 MG/ML IV BOLUS
INTRAVENOUS | Status: DC | PRN
  Administered 2016-10-28: 140 mg via INTRAVENOUS

## 2016-10-28 MED ORDER — SUGAMMADEX SODIUM 200 MG/2ML IV SOLN
INTRAVENOUS | Status: DC | PRN
  Administered 2016-10-28: 160 mg via INTRAVENOUS

## 2016-10-28 MED ORDER — DEXTROSE 5 % IV SOLN
2.0000 g | INTRAVENOUS | Status: DC
  Administered 2016-10-28 – 2016-10-29 (×2): 2 g via INTRAVENOUS
  Filled 2016-10-28 (×2): qty 2

## 2016-10-28 MED ORDER — PROPOFOL 10 MG/ML IV BOLUS
INTRAVENOUS | Status: AC
  Filled 2016-10-28: qty 20

## 2016-10-28 MED ORDER — ROCURONIUM BROMIDE 10 MG/ML (PF) SYRINGE
PREFILLED_SYRINGE | INTRAVENOUS | Status: AC
  Filled 2016-10-28: qty 5

## 2016-10-28 MED ORDER — FENTANYL CITRATE (PF) 100 MCG/2ML IJ SOLN
INTRAMUSCULAR | Status: DC | PRN
  Administered 2016-10-28: 100 ug via INTRAVENOUS
  Administered 2016-10-28: 50 ug via INTRAVENOUS

## 2016-10-28 MED ORDER — BUPIVACAINE HCL (PF) 0.25 % IJ SOLN
INTRAMUSCULAR | Status: AC
  Filled 2016-10-28: qty 30

## 2016-10-28 MED ORDER — SODIUM CHLORIDE 0.9 % IR SOLN
Status: DC | PRN
  Administered 2016-10-28: 1000 mL

## 2016-10-28 SURGICAL SUPPLY — 43 items
APPLIER CLIP 5 13 M/L LIGAMAX5 (MISCELLANEOUS)
BENZOIN TINCTURE PRP APPL 2/3 (GAUZE/BANDAGES/DRESSINGS) ×4 IMPLANT
CANISTER SUCT 3000ML PPV (MISCELLANEOUS) ×4 IMPLANT
CHLORAPREP W/TINT 26ML (MISCELLANEOUS) ×4 IMPLANT
CLIP APPLIE 5 13 M/L LIGAMAX5 (MISCELLANEOUS) IMPLANT
CLIP VESOLOCK MED LG 6/CT (CLIP) ×4 IMPLANT
CLOSURE WOUND 1/2 X4 (GAUZE/BANDAGES/DRESSINGS) ×1
COVER MAYO STAND STRL (DRAPES) ×4 IMPLANT
COVER SURGICAL LIGHT HANDLE (MISCELLANEOUS) ×4 IMPLANT
COVER TRANSDUCER ULTRASND (DRAPES) IMPLANT
DRAPE C-ARM 42X72 X-RAY (DRAPES) IMPLANT
ELECT REM PT RETURN 9FT ADLT (ELECTROSURGICAL) ×4
ELECTRODE REM PT RTRN 9FT ADLT (ELECTROSURGICAL) ×2 IMPLANT
GAUZE SPONGE 2X2 8PLY STRL LF (GAUZE/BANDAGES/DRESSINGS) ×2 IMPLANT
GLOVE BIO SURGEON STRL SZ7.5 (GLOVE) ×4 IMPLANT
GOWN STRL REUS W/ TWL LRG LVL3 (GOWN DISPOSABLE) ×4 IMPLANT
GOWN STRL REUS W/ TWL XL LVL3 (GOWN DISPOSABLE) ×2 IMPLANT
GOWN STRL REUS W/TWL LRG LVL3 (GOWN DISPOSABLE) ×4
GOWN STRL REUS W/TWL XL LVL3 (GOWN DISPOSABLE) ×2
GRASPER SUT TROCAR 14GX15 (MISCELLANEOUS) ×4 IMPLANT
IV CATH 14GX2 1/4 (CATHETERS) IMPLANT
KIT BASIN OR (CUSTOM PROCEDURE TRAY) ×4 IMPLANT
KIT ROOM TURNOVER OR (KITS) ×4 IMPLANT
NEEDLE INSUFFLATION 14GA 120MM (NEEDLE) ×4 IMPLANT
NS IRRIG 1000ML POUR BTL (IV SOLUTION) ×4 IMPLANT
PAD ARMBOARD 7.5X6 YLW CONV (MISCELLANEOUS) ×8 IMPLANT
POUCH RETRIEVAL ECOSAC 10 (ENDOMECHANICALS) ×2 IMPLANT
POUCH RETRIEVAL ECOSAC 10MM (ENDOMECHANICALS) ×2
SCISSORS LAP 5X35 DISP (ENDOMECHANICALS) ×4 IMPLANT
SET CHOLANGIOGRAPHY FRANKLIN (SET/KITS/TRAYS/PACK) IMPLANT
SET IRRIG TUBING LAPAROSCOPIC (IRRIGATION / IRRIGATOR) ×4 IMPLANT
SLEEVE ENDOPATH XCEL 5M (ENDOMECHANICALS) ×8 IMPLANT
SPECIMEN JAR SMALL (MISCELLANEOUS) ×4 IMPLANT
SPONGE GAUZE 2X2 STER 10/PKG (GAUZE/BANDAGES/DRESSINGS) ×2
STOPCOCK 4 WAY LG BORE MALE ST (IV SETS) IMPLANT
STRIP CLOSURE SKIN 1/2X4 (GAUZE/BANDAGES/DRESSINGS) ×3 IMPLANT
SUT MNCRL AB 3-0 PS2 18 (SUTURE) ×4 IMPLANT
TOWEL OR 17X24 6PK STRL BLUE (TOWEL DISPOSABLE) ×4 IMPLANT
TOWEL OR 17X26 10 PK STRL BLUE (TOWEL DISPOSABLE) ×4 IMPLANT
TRAY LAPAROSCOPIC MC (CUSTOM PROCEDURE TRAY) ×4 IMPLANT
TROCAR XCEL NON-BLD 11X100MML (ENDOMECHANICALS) ×4 IMPLANT
TROCAR XCEL NON-BLD 5MMX100MML (ENDOMECHANICALS) ×4 IMPLANT
TUBING INSUFFLATION (TUBING) ×4 IMPLANT

## 2016-10-28 NOTE — Progress Notes (Addendum)
Patient arrived back to 6n13, alert and oriented, moderate pain to mid-abdomen. IV intact and fluids infusing, 5 gauze dressings one with slight shadowing to mid abomen. VSS, SCDs on, given patient clear liquids, will continue to monitor. Family at bedside.

## 2016-10-28 NOTE — Progress Notes (Signed)
Received patient from ED. Patient AOx4, VS stable, O2Sat at 95% RA, RUQ pain at 2/10 and ambulatory.  Patient oriented to room, bed controls and pre-op procedures done.  Patient now resting on bed with both eyes closed. Will monitor.

## 2016-10-28 NOTE — Discharge Instructions (Signed)

## 2016-10-28 NOTE — H&P (Signed)
[name] [name] is an 76 y.o. male.   Chief Complaint: abdominal pain HPI: 1 day history of RUQ abdominal pain that started this am. Location is right upper quadrant of the abdomen without radiation.  Denies N/V.  Pain is sharp and constant. /U/S reveal gallstones.    Past Medical History:  Diagnosis Date  . Cataracts, bilateral   . GERD (gastroesophageal reflux disease)   . Glaucoma   . Hyperlipidemia   . Hypertension   . Shingles   . Varicose veins 03/31/2015    Past Surgical History:  Procedure Laterality Date  . AORTIC VALVE REPLACEMENT N/A 09/28/2014   Procedure: AORTIC VALVE REPLACEMENT (AVR);  Surgeon: [name], MD;  Location: [place];  Service: Open Heart Surgery;  Laterality: N/A;  . CARDIAC CATHETERIZATION N/A 09/23/2014   Procedure: Left Heart Cath and Coronary Angiography;  Surgeon: [name], MD;  Location: [facility];  Service: Cardiovascular;  Laterality: N/A;  . CATARACT EXTRACTION    . CORONARY ARTERY BYPASS GRAFT N/A 09/28/2014   Procedure: CORONARY ARTERY BYPASS GRAFTING (CABG) x4 using left internal mammory artery and right saphenous leg vein harvested endoscopically.;  Surgeon: [name], MD;  Location: [facility];  Service: Open Heart Surgery;  Laterality: N/A;  . PROSTATECTOMY    . TEE WITHOUT CARDIOVERSION N/A 09/28/2014   Procedure: TRANSESOPHAGEAL ECHOCARDIOGRAM (TEE);  Surgeon: [name], MD;  Location: [facility];  Service: Open Heart Surgery;  Laterality: N/A;    Family History  Problem Relation Age of Onset  . Ovarian cancer Mother    Social History:  reports that he quit smoking about 37 years ago. He has never used smokeless tobacco. He reports that he drinks alcohol. He reports that he does not use drugs.  Allergies:  Allergies  Allergen Reactions  . Hydrochlorothiazide Swelling     (Not in a hospital admission)  Results for orders placed or performed during the hospital encounter of 10/27/16 (from the past 48  hour(s))  Lipase, blood     Status: None   Collection Time: 10/27/16  7:22 PM  Result Value Ref Range   Lipase 28 11 - 51 U/L  Comprehensive metabolic panel     Status: Abnormal   Collection Time: 10/27/16  7:22 PM  Result Value Ref Range   Sodium 136 135 - 145 mmol/L   Potassium 3.7 3.5 - 5.1 mmol/L   Chloride 106 101 - 111 mmol/L   CO2 22 22 - 32 mmol/L   Glucose, Bld 135 (H) 65 - 99 mg/dL   BUN 23 (H) 6 - 20 mg/dL   Creatinine, Ser 0.77 0.61 - 1.24 mg/dL   Calcium 9.2 8.9 - 10.3 mg/dL   Total Protein 6.4 (L) 6.5 - 8.1 g/dL   Albumin 3.7 3.5 - 5.0 g/dL   AST 21 15 - 41 U/L   ALT 19 17 - 63 U/L   Alkaline Phosphatase 63 38 - 126 U/L   Total Bilirubin 1.1 0.3 - 1.2 mg/dL   GFR calc non Af Amer >60 >60 mL/min   GFR calc Af Amer >60 >60 mL/min    Comment: (NOTE) The eGFR has been calculated using the CKD EPI equation. This calculation has not been validated in all clinical situations. eGFR's persistently <60 mL/min signify possible Chronic Kidney Disease.    Anion gap 8 5 - 15  CBC     Status: Abnormal   Collection Time: 10/27/16  7:22 PM  Result Value Ref Range  WBC 11.8 (H) 4.0 - 10.5 K/uL   RBC 4.44 4.22 - 5.81 MIL/uL   Hemoglobin 14.2 13.0 - 17.0 g/dL   HCT 41.4 39.0 - 52.0 %   MCV 93.2 78.0 - 100.0 fL   MCH 32.0 26.0 - 34.0 pg   MCHC 34.3 30.0 - 36.0 g/dL   RDW 12.2 11.5 - 15.5 %   Platelets 177 150 - 400 K/uL  I-Stat Troponin, ED (not at [facility])     Status: None   Collection Time: 10/27/16  7:56 PM  Result Value Ref Range   Troponin i, poc 0.00 0.00 - 0.08 ng/mL   Comment 3            Comment: Due to the release kinetics of cTnI, a negative result within the first hours of the onset of symptoms does not rule out myocardial infarction with certainty. If myocardial infarction is still suspected, repeat the test at appropriate intervals.    Dg Chest 2 View  Result Date: 10/27/2016 CLINICAL DATA:  Right upper quadrant abdominal pain and near syncope x2 days.  Hypertension and former smoker. EXAM: CHEST  2 VIEW COMPARISON:  09/10/2015 FINDINGS: Top normal size cardiac silhouette. Aortic atherosclerosis at the arch. Status post CABG. Aortic valvular prosthetic noted. Elevated left hemidiaphragm is stable with atelectasis at the lung bases. No pneumonic consolidation, pulmonary edema or effusion. IMPRESSION: 1. Status post CABG and AVR. 2. Aortic atherosclerosis and bibasilar atelectasis. 3. No pneumonic consolidation. Electronically Signed   By: Ashley Royalty M.D.   On: 10/27/2016 21:28   US Abdomen Limited  Result Date: 10/27/2016 CLINICAL DATA:  Right upper quadrant pain EXAM: ULTRASOUND ABDOMEN LIMITED RIGHT UPPER QUADRANT COMPARISON:  06/13/2005 CT FINDINGS: Gallbladder: Biliary sludge and calculi are noted the largest stone measuring approximately 3.3 cm. Top normal size gallbladder wall thickness at 3 mm. No sonographic Murphy sign per technologist. No pericholecystic fluid. Common bile duct: Diameter:  5.2 mm and within normal limits. Liver: No focal lesion identified. Within normal limits in parenchymal echogenicity. Portal vein is patent on color Doppler imaging with normal direction of blood flow towards the liver. IMPRESSION: Cholelithiasis and biliary sludge without secondary signs of acute cholecystitis. Electronically Signed   By: Ashley Royalty M.D.   On: 10/27/2016 21:25    Review of Systems  Constitutional: Negative for chills and fever.  HENT: Negative for hearing loss.   Eyes: Negative for blurred vision.  Respiratory: Negative for sputum production and shortness of breath.   Cardiovascular: Negative for chest pain.  Gastrointestinal: Positive for abdominal pain. Negative for diarrhea.  Genitourinary: Positive for dysuria. Negative for urgency.  Musculoskeletal: Negative for myalgias and neck pain.  Skin: Negative for rash.  Neurological: Negative for dizziness.  Endo/Heme/Allergies: Does not bruise/bleed easily.  Psychiatric/Behavioral:  Negative for depression.    Blood pressure (!) 156/88, pulse 96, temperature 98.1 F (36.7 C), temperature source Oral, resp. rate (!) 23, height '6\' 1"'  (1.854 m), weight 77.1 kg (170 lb), SpO2 91 %. Physical Exam   Assessment/Plan Acute cholecystitis   Admit for IVF, ABX and pain management  Dr Rosendo Gros to see in am to decide on timing of cholecystectomy.    Patient Active Problem List   Diagnosis Date Noted  . Varicose veins of left lower extremity with complications 90/24/0973  . Varicose veins 03/31/2015  . Bradycardia 09/29/2014  . S/P CABG (coronary artery bypass graft)   . S/P CABG x 4 09/28/2014  . Unstable angina pectoris ([place]) 09/23/2014  .  Cardiac murmur, previously undiagnosed 09/23/2014  . Coronary artery disease involving native heart with unstable angina pectoris ([place]) 09/23/2014  . Unstable angina ([place]) 09/23/2014  . Syncope and collapse 09/30/2013  . Syncope 09/30/2013  . Essential hypertension, benign 09/30/2013  . GERD (gastroesophageal reflux disease) 09/30/2013    Orange Hilligoss A., MD 10/28/2016, 12:13 AM

## 2016-10-28 NOTE — Transfer of Care (Signed)
Immediate Anesthesia Transfer of Care Note  Patient: [name] [name]  Procedure(s) Performed: LAPAROSCOPIC CHOLECYSTECTOMY (N/A Abdomen)  Patient Location: PACU  Anesthesia Type:General  Level of Consciousness: awake, alert  and oriented  Airway & Oxygen Therapy: Patient Spontanous Breathing  Post-op Assessment: Report given to RN, Post -op Vital signs reviewed and stable and Patient moving all extremities X 4  Post vital signs: Reviewed and stable  Last Vitals:  Vitals:   10/28/16 0659 10/28/16 0801  BP: 133/71 131/78  Pulse: 79 78  Resp: 20   Temp: 37.2 C   SpO2: 97%     Last Pain:  Vitals:   10/28/16 0702  TempSrc:   PainSc: 7          Complications: No apparent anesthesia complications

## 2016-10-28 NOTE — ED Notes (Signed)
Attempted to call report

## 2016-10-28 NOTE — Anesthesia Preprocedure Evaluation (Addendum)
Anesthesia Evaluation  Patient identified by MRN, date of birth, ID band Patient awake    Reviewed: Allergy & Precautions, NPO status , Patient's Chart, lab work & pertinent test results  Airway Mallampati: II  TM Distance: >3 FB Neck ROM: Full    Dental  (+) Missing, Dental Advisory Given   Pulmonary former smoker,    breath sounds clear to auscultation       Cardiovascular hypertension, + Peripheral Vascular Disease   Rhythm:Regular Rate:Normal     Neuro/Psych    GI/Hepatic GERD  Medicated,  Endo/Other    Renal/GU      Musculoskeletal   Abdominal   Peds  Hematology   Anesthesia Other Findings   Reproductive/Obstetrics                            Anesthesia Physical Anesthesia Plan  ASA: III  Anesthesia Plan: General   Post-op Pain Management:    Induction: Intravenous  PONV Risk Score and Plan:   Airway Management Planned: Oral ETT  Additional Equipment:   Intra-op Plan:   Post-operative Plan: Extubation in OR  Informed Consent: I have reviewed the patients History and Physical, chart, labs and discussed the procedure including the risks, benefits and alternatives for the proposed anesthesia with the patient or authorized representative who has indicated his/her understanding and acceptance.   Dental advisory given  Plan Discussed with: CRNA, Anesthesiologist and Surgeon  Anesthesia Plan Comments:         Anesthesia Quick Evaluation

## 2016-10-28 NOTE — Anesthesia Postprocedure Evaluation (Signed)
Anesthesia Post Note  Patient: [name] [name]  Procedure(s) Performed: LAPAROSCOPIC CHOLECYSTECTOMY (N/A Abdomen)     Patient location during evaluation: PACU Anesthesia Type: General Level of consciousness: awake, awake and alert and oriented Pain management: pain level controlled Vital Signs Assessment: post-procedure vital signs reviewed and stable Respiratory status: spontaneous breathing, nonlabored ventilation and respiratory function stable Cardiovascular status: blood pressure returned to baseline Anesthetic complications: no    Last Vitals:  Vitals:   10/28/16 1052 10/28/16 1138  BP: (!) 159/93 (!) 143/82  Pulse: 79 74  Resp: 20 19  Temp: (!) 36.3 C 36.8 C  SpO2: 99% 94%    Last Pain:  Vitals:   10/28/16 1232  TempSrc:   PainSc: 5                  [place] COKER

## 2016-10-28 NOTE — Anesthesia Procedure Notes (Signed)
Procedure Name: Intubation Date/Time: 10/28/2016 8:53 AM Performed by: Garrison [place] T Pre-anesthesia Checklist: Patient identified, Emergency Drugs available, Suction available and Patient being monitored Patient Re-evaluated:Patient Re-evaluated prior to induction Oxygen Delivery Method: Circle System Utilized Preoxygenation: Pre-oxygenation with 100% oxygen Induction Type: IV induction Ventilation: Mask ventilation without difficulty and Oral airway inserted - appropriate to patient size Laryngoscope Size: Sabra Heck and 2 Grade View: Grade I Tube type: Oral Tube size: 7.5 mm Number of attempts: 1 Airway Equipment and Method: Stylet and Oral airway Placement Confirmation: ETT inserted through vocal cords under direct vision,  positive ETCO2 and breath sounds checked- equal and bilateral Secured at: 22 cm Tube secured with: Tape Dental Injury: Teeth and Oropharynx as per pre-operative assessment

## 2016-10-28 NOTE — Op Note (Signed)
10/28/2016  9:37 AM  PATIENT:  [name] [name]  76 y.o. male  PRE-OPERATIVE DIAGNOSIS:  Gallstones  POST-OPERATIVE DIAGNOSIS:  Acute purulent cholecystitis, Gallstones  PROCEDURE:  Procedure(s): LAPAROSCOPIC CHOLECYSTECTOMY (N/A)  SURGEON:  Surgeon(s) and Role:    Ralene Ok, MD - Primary  ANESTHESIA:   local and general  EBL:  Total I/O In: 700 [I.V.:700] Out: 20 [Blood:5]  BLOOD ADMINISTERED:none  DRAINS: none   LOCAL MEDICATIONS USED:  BUPIVICAINE   SPECIMEN:  Source of Specimen:  gallbaldder  DISPOSITION OF SPECIMEN:  PATHOLOGY  COUNTS:  YES  TOURNIQUET:  * No tourniquets in log *  DICTATION: .Dragon Dictation The patient was taken to the operating and placed in the supine position with bilateral SCDs in place. The patient was prepped and draped in the usual sterile fashion. A time out was called and all facts were verified. A pneumoperitoneum was obtained via A Veress needle technique to a pressure of 69mm of mercury.  A 42mm trochar was then placed in the right upper quadrant under visualization, and there were no injuries to any abdominal organs. A 11 mm port was then placed in the umbilical region after infiltrating with local anesthesia under direct visualization. A second and third epigastric port and right lower quadrant port placement under direct visualization, respectively. The liver was very fatty and large. The gallbladder was identified and seen to be acutely inflamed and purulent.  It was grasped and retracted, the peritoneum was then sharply dissected from the gallbladder and this dissection was carried down to Calot's triangle. The gallbladder was identified and stripped away circumferentially and seen going into the gallbladder 360, the critical angle was obtained.  2 clips were placed proximally one distally and the cystic duct transected. The cystic artery was identified and 2 clips placed proximally and one distally and transected. We then  proceeded to remove the gallbladder off the hepatic fossa with Bovie cautery.  There was some spillage of stones.  These were retrieved and placed in the retrieval bag. A retrieval bag was then placed in the abdomen and gallbladder placed in the bag. The hepatic fossa was then reexamined and hemostasis was achieved with Bovie cautery and was excellent at the end of the case. The subhepatic fossa and perihepatic fossa was then irrigated until the effluent was clear.  The gallbladder and bag were removed from the abdominal cavity. Upon removing the bag, the bottom of the retrieval bag ripped and a large gallstone was lodged in the sq space at the umbilicus.  This was removed but there were miniscule fragments.  The area was infiltrated with iodine and irrigated with saline. The 11 mm trocar fascia was reapproximated with the Endo Close #1 Vicryl x2. The pneumoperitoneum was evacuated and all trochars removed under direct visulalization. The skin was then closed with 4-0 Monocryl and the skin dressed with Steri-Strips, gauze, and tape. The patient was awaken from general anesthesia and taken to the recovery room in stable condition.   PLAN OF CARE: Admit for overnight observation  PATIENT DISPOSITION:  PACU - hemodynamically stable.   Delay start of Pharmacological VTE agent (>24hrs) due to surgical blood loss or risk of bleeding: no

## 2016-10-28 NOTE — Progress Notes (Signed)
Patient transported to surgery, pre-procedure checklist complete, report called to short stay and given to Lindsey,family at bedside and going down with patient.

## 2016-10-29 ENCOUNTER — Encounter (HOSPITAL_COMMUNITY): Payer: Self-pay | Admitting: General Surgery

## 2016-10-29 MED ORDER — AMOXICILLIN-POT CLAVULANATE 875-125 MG PO TABS: 1.0000 | ORAL_TABLET | Freq: Two times a day (BID) | ORAL | 0 refills | Status: AC

## 2016-10-29 MED ORDER — OXYCODONE-ACETAMINOPHEN 5-325 MG PO TABS
1.0000 | ORAL_TABLET | ORAL | 0 refills | Status: DC | PRN
Start: 2016-10-29 — End: 2018-03-23

## 2016-10-29 NOTE — Care Management Obs Status (Signed)
[facility] NOTIFICATION   Patient Details  Name: JT BRABEC MRN: [id number] Date of Birth: [birth year]   Medicare Observation Status Notification Given:  Yes    Marilu Favre, RN 10/29/2016, 10:49 AM

## 2016-10-29 NOTE — Progress Notes (Signed)
Patient and family read AVS and confirmed understanding information.  Patient escorted to personal car via W/C.

## 2016-10-29 NOTE — Discharge Summary (Signed)
[facility] Discharge Summary   Patient ID: [name] [name] MRN: [id number] DOB/AGE: 09/11/1940 76 y.o.  Admit date: 10/27/2016 Discharge date: 10/29/2016  Admitting Diagnosis: Acute cholecystitis  Discharge Diagnosis Acute cholecystitis - s/p laparoscopic cholecystectomy  Consultants None  Imaging: Dg Chest 2 View  Result Date: 10/27/2016 CLINICAL DATA:  Right upper quadrant abdominal pain and near syncope x2 days. Hypertension and former smoker. EXAM: CHEST  2 VIEW COMPARISON:  09/10/2015 FINDINGS: Top normal size cardiac silhouette. Aortic atherosclerosis at the arch. Status post CABG. Aortic valvular prosthetic noted. Elevated left hemidiaphragm is stable with atelectasis at the lung bases. No pneumonic consolidation, pulmonary edema or effusion. IMPRESSION: 1. Status post CABG and AVR. 2. Aortic atherosclerosis and bibasilar atelectasis. 3. No pneumonic consolidation. Electronically Signed   By: Ashley Royalty M.D.   On: 10/27/2016 21:28   US Abdomen Limited  Result Date: 10/27/2016 CLINICAL DATA:  Right upper quadrant pain EXAM: ULTRASOUND ABDOMEN LIMITED RIGHT UPPER QUADRANT COMPARISON:  06/13/2005 CT FINDINGS: Gallbladder: Biliary sludge and calculi are noted the largest stone measuring approximately 3.3 cm. Top normal size gallbladder wall thickness at 3 mm. No sonographic Murphy sign per technologist. No pericholecystic fluid. Common bile duct: Diameter:  5.2 mm and within normal limits. Liver: No focal lesion identified. Within normal limits in parenchymal echogenicity. Portal vein is patent on color Doppler imaging with normal direction of blood flow towards the liver. IMPRESSION: Cholelithiasis and biliary sludge without secondary signs of acute cholecystitis. Electronically Signed   By: Ashley Royalty M.D.   On: 10/27/2016 21:25    Procedures Dr. Ralene Ok (10/28/16) - Laparoscopic Cholecystectomy   Hospital Course:  Patient is a 76 y.o. male who presented to  [facility] with RUQ pain.  Workup showed acute cholecystitis.  Patient was admitted and underwent procedure listed above.  Tolerated procedure well and was transferred to the floor.  Diet was advanced as tolerated.  On POD#1, the patient was voiding well, tolerating diet, ambulating well, pain well controlled, vital signs stable, incisions c/d/i and felt stable for discharge home. Patient will be discharged on 7 days oral antibiotics. Patient will follow up in our office in 2 weeks and knows to call with questions or concerns.  He will call to confirm appointment date/time.    Physical Exam: General:  Alert, NAD, pleasant, comfortable Resp: normal effort Abd:  Soft, ND, mild tenderness, incisions C/D/I  Allergies as of 10/29/2016      Reactions   Hydrochlorothiazide Swelling      Medication List    TAKE these medications   acetaminophen 500 MG tablet Commonly known as:  TYLENOL Take 1,000 mg by mouth every 6 (six) hours as needed for headache (pain).   acyclovir 800 MG tablet Commonly known as:  ZOVIRAX Take 400 mg by mouth 2 (two) times daily. Continuous course   amLODipine 5 MG tablet Commonly known as:  NORVASC Take 5 mg by mouth daily.   amoxicillin-clavulanate 875-125 MG tablet Commonly known as:  AUGMENTIN Take 1 tablet by mouth every 12 (twelve) hours.   aspirin EC 81 MG tablet Take 1 tablet (81 mg total) by mouth daily. What changed:  when to take this   atorvastatin 40 MG tablet Commonly known as:  LIPITOR Take 40 mg by mouth at bedtime.   carvedilol 6.25 MG tablet Commonly known as:  COREG Take 1 tablet (6.25 mg total) by mouth 2 (two) times daily. Please keep appointment.   esomeprazole 20 MG capsule Commonly known as:  NEXIUM Take 20 mg by mouth every other day. Reported on 03/01/2015   lisinopril 20 MG tablet Commonly known as:  PRINIVIL,ZESTRIL Take 1 tablet (20 mg total) by mouth 2 (two) times daily.   meloxicam 15 MG tablet Commonly known as:  MOBIC Take 15  mg by mouth at bedtime.   multivitamin with minerals tablet Take 1 tablet by mouth daily.   oxybutynin 5 MG tablet Commonly known as:  DITROPAN Take 5 mg by mouth at bedtime.   oxyCODONE-acetaminophen 5-325 MG tablet Commonly known as:  PERCOCET/ROXICET Take 1 tablet by mouth every 4 (four) hours as needed for moderate pain.   prednisoLONE acetate 1 % ophthalmic suspension Commonly known as:  PRED FORTE Place 1 drop into the left eye daily.   timolol 0.5 % ophthalmic solution Commonly known as:  TIMOPTIC Place 1 drop into the left eye 2 (two) times daily.        Follow-up [facility] Follow up.   Specialty:  General Surgery Why:  Please call to confirm appointment date and time. Please arrive 30 min prior to appointment time. Bring photo ID and insurance information. Contact information: [address] [place] [phone]          Signed: Brigid Re , [facility] 10/29/2016, 10:25 AM Pager: [phone] Consults: [phone] Mon-Fri 7:00 am-4:30 pm Sat-Sun 7:00 am-11:30 am

## 2016-10-30 NOTE — Consult Note (Signed)
           [facility] CM Primary Care Navigator  10/30/2016  [name] [name] [birth year] [id number]   Attemptto seepatient at the bedsideto identify possible discharge needs but he was alreadydischargedper staff report.  Patient was discharged home yesterday.  Primary care provider's officeis listed as doing transition of care (TOC).   For questions, please contact:  Dannielle Huh, BSN, RN- [facility] Primary Care Navigator  Telephone: [phone] [place]

## 2016-11-03 ENCOUNTER — Telehealth: Payer: Self-pay | Admitting: General Surgery

## 2016-11-03 NOTE — Telephone Encounter (Signed)
Pt s/p lap chole 1 week ago.  Has had hiccups since surgery.  These are now constant and not improving.  Pt can't sleep.  Pain meds don't help.  He denies fevers, is having occasional nausea but no vomiting and is having bowel function.  I recommend that the patient switch to a warm liquid diet today and ambulate as much as possible.  Cannot rule out biliary leak but would think he would be having more severe symptoms this far out from surgery if this was occurring.  Pt instructed to proceed to ED if symptoms worsened.   Rosario Adie, MD  [facility]

## 2016-11-06 DIAGNOSIS — C44529 Squamous cell carcinoma of skin of other part of trunk: Secondary | ICD-10-CM | POA: Diagnosis not present

## 2016-11-06 DIAGNOSIS — Z0189 Encounter for other specified special examinations: Secondary | ICD-10-CM | POA: Diagnosis not present

## 2016-11-19 DIAGNOSIS — E7800 Pure hypercholesterolemia, unspecified: Secondary | ICD-10-CM | POA: Diagnosis not present

## 2016-11-19 DIAGNOSIS — N182 Chronic kidney disease, stage 2 (mild): Secondary | ICD-10-CM | POA: Diagnosis not present

## 2016-11-19 DIAGNOSIS — Z8546 Personal history of malignant neoplasm of prostate: Secondary | ICD-10-CM | POA: Diagnosis not present

## 2016-11-19 DIAGNOSIS — I1310 Hypertensive heart and chronic kidney disease without heart failure, with stage 1 through stage 4 chronic kidney disease, or unspecified chronic kidney disease: Secondary | ICD-10-CM | POA: Diagnosis not present

## 2016-11-19 DIAGNOSIS — I2510 Atherosclerotic heart disease of native coronary artery without angina pectoris: Secondary | ICD-10-CM | POA: Diagnosis not present

## 2017-01-01 DIAGNOSIS — Z961 Presence of intraocular lens: Secondary | ICD-10-CM | POA: Diagnosis not present

## 2017-01-01 DIAGNOSIS — Z947 Corneal transplant status: Secondary | ICD-10-CM | POA: Diagnosis not present

## 2017-01-01 DIAGNOSIS — H16123 Filamentary keratitis, bilateral: Secondary | ICD-10-CM | POA: Diagnosis not present

## 2017-01-10 DIAGNOSIS — Z961 Presence of intraocular lens: Secondary | ICD-10-CM | POA: Diagnosis not present

## 2017-01-10 DIAGNOSIS — H16122 Filamentary keratitis, left eye: Secondary | ICD-10-CM | POA: Diagnosis not present

## 2017-01-10 DIAGNOSIS — Z947 Corneal transplant status: Secondary | ICD-10-CM | POA: Diagnosis not present

## 2017-01-13 ENCOUNTER — Other Ambulatory Visit: Payer: Self-pay | Admitting: Cardiovascular Disease

## 2017-01-15 NOTE — Telephone Encounter (Signed)
Please review for refill, Thanks !  

## 2017-01-15 NOTE — Telephone Encounter (Signed)
Refill Request.  

## 2017-02-05 DIAGNOSIS — C61 Malignant neoplasm of prostate: Secondary | ICD-10-CM | POA: Diagnosis not present

## 2017-02-28 DIAGNOSIS — H4052X3 Glaucoma secondary to other eye disorders, left eye, severe stage: Secondary | ICD-10-CM | POA: Diagnosis not present

## 2017-03-18 DIAGNOSIS — B353 Tinea pedis: Secondary | ICD-10-CM | POA: Diagnosis not present

## 2017-03-18 DIAGNOSIS — L821 Other seborrheic keratosis: Secondary | ICD-10-CM | POA: Diagnosis not present

## 2017-03-18 DIAGNOSIS — Z85828 Personal history of other malignant neoplasm of skin: Secondary | ICD-10-CM | POA: Diagnosis not present

## 2017-03-21 ENCOUNTER — Encounter: Payer: Self-pay | Admitting: Cardiovascular Disease

## 2017-03-21 ENCOUNTER — Ambulatory Visit: Payer: Medicare HMO | Admitting: Cardiovascular Disease

## 2017-03-21 VITALS — BP 136/90 | HR 60 | Ht 73.0 in | Wt 171.6 lb

## 2017-03-21 DIAGNOSIS — Z952 Presence of prosthetic heart valve: Secondary | ICD-10-CM | POA: Diagnosis not present

## 2017-03-21 DIAGNOSIS — I119 Hypertensive heart disease without heart failure: Secondary | ICD-10-CM

## 2017-03-21 DIAGNOSIS — E7800 Pure hypercholesterolemia, unspecified: Secondary | ICD-10-CM

## 2017-03-21 DIAGNOSIS — Z951 Presence of aortocoronary bypass graft: Secondary | ICD-10-CM | POA: Diagnosis not present

## 2017-03-21 MED ORDER — MAGIC MOUTHWASH: 5.0000 mL | Freq: Three times a day (TID) | ORAL | 0 refills | Status: DC | PRN

## 2017-03-21 NOTE — Progress Notes (Signed)
Cardiology Office Note   Date:  03/21/2017   ID:  Ezel, Vallone 1941-01-19, MRN [id number]  PCP:  [name], MD  Cardiologist:   [name], MD  CT Surgeon: [name]  No chief complaint on file.   Patient ID: [name] [name] is a 77 y.o. male with hypertension, CAD s/p CABG (LIMA-->LAD, SVG-->RI/OM1, SVG-->PDA), aortic stenosis s/p AVR, bradycardia, and syncope who presents for follow up.  History of Present Illness:  Mr. Sawin underwent 4 vessel CABG and AVR (25 mm [facility] Ease pericardial tissue valve) on 09/28/14.  His post-operative course was complicated by atrial fibrillation/flutter, but he was discharged in sinus rhythm.  He had an episode of lightheadedness in 08/2015 and was seen in the ED.  He was noted to have acute renal failure and was felt to be dehydrated. He had been working in the yard for several hours the day before and was not well hydrated.  Metoprolol was switched to carvedilol due to bradycardia and hypertension.  He was also started on amlodipine, so simvastatin was switched to atorvastatin. At his last appointment his blood pressure was elevated but it has been well-controlled at home so no changes were made to his regimen  Since his last appointment Mr. Narang has been doing well.  He brings a log of his blood pressure showing that they have been mostly less than 130/80.  He has not expands any chest pain or shortness of breath.  He also denies palpitations, lightheadedness, or dizziness.  He tries to walk for exercise when it is not raining.  However he has not been doing that much lately.  He had a cholecystectomy 10/2016 and struggled with after the surgery.  These have since resolved.  Today he complains of sore throat that has been ongoing for the last week.  He denies fever or chills.   Past Medical History:  Diagnosis Date  . Cataracts, bilateral   . GERD (gastroesophageal reflux disease)   . Glaucoma   . Hyperlipidemia   .  Hypertension   . Shingles   . Varicose veins 03/31/2015    Past Surgical History:  Procedure Laterality Date  . AORTIC VALVE REPLACEMENT N/A 09/28/2014   Procedure: AORTIC VALVE REPLACEMENT (AVR);  Surgeon: [name], MD;  Location: [place];  Service: Open Heart Surgery;  Laterality: N/A;  . CARDIAC CATHETERIZATION N/A 09/23/2014   Procedure: Left Heart Cath and Coronary Angiography;  Surgeon: [name], MD;  Location: [place] [facility];  Service: Cardiovascular;  Laterality: N/A;  . CATARACT EXTRACTION    . CHOLECYSTECTOMY N/A 10/28/2016   Procedure: LAPAROSCOPIC CHOLECYSTECTOMY;  Surgeon: Ralene Ok, MD;  Location: [place];  Service: General;  Laterality: N/A;  . CORONARY ARTERY BYPASS GRAFT N/A 09/28/2014   Procedure: CORONARY ARTERY BYPASS GRAFTING (CABG) x4 using left internal mammory artery and right saphenous leg vein harvested endoscopically.;  Surgeon: [name], MD;  Location: [place];  Service: Open Heart Surgery;  Laterality: N/A;  . PROSTATECTOMY    . TEE WITHOUT CARDIOVERSION N/A 09/28/2014   Procedure: TRANSESOPHAGEAL ECHOCARDIOGRAM (TEE);  Surgeon: [name], MD;  Location: [place];  Service: Open Heart Surgery;  Laterality: N/A;     Current Outpatient Medications  Medication Sig Dispense Refill  . acetaminophen (TYLENOL) 500 MG tablet Take 1,000 mg by mouth every 6 (six) hours as needed for headache (pain).     Marland Kitchen acyclovir (ZOVIRAX) 400 MG tablet Take 400 mg by  mouth 2 (two) times daily.    Marland Kitchen amLODipine (NORVASC) 5 MG tablet Take 5 mg by mouth daily.    Marland Kitchen aspirin EC 81 MG tablet Take 1 tablet (81 mg total) by mouth daily. (Patient taking differently: Take 81 mg by mouth at bedtime. ) 90 tablet 3  . atorvastatin (LIPITOR) 40 MG tablet Take 40 mg by mouth at bedtime.     . carvedilol (COREG) 6.25 MG tablet Take 1 tablet (6.25 mg total) by mouth 2 (two) times daily. Please keep appointment. 180 tablet 3  . esomeprazole (NEXIUM) 20 MG capsule  Take 20 mg by mouth every other day. Reported on 03/01/2015    . lisinopril (PRINIVIL,ZESTRIL) 20 MG tablet TAKE 1 TABLET TWICE DAILY (NEW DOSE) 180 tablet 1  . magic mouthwash SOLN Take 5 mLs by mouth 3 (three) times daily as needed for mouth pain. 120 mL 0  . meloxicam (MOBIC) 15 MG tablet Take 15 mg by mouth at bedtime.     . Multiple Vitamins-Minerals (MULTIVITAMIN WITH MINERALS) tablet Take 1 tablet by mouth daily.    Marland Kitchen oxybutynin (DITROPAN) 5 MG tablet Take 5 mg by mouth at bedtime.    Marland Kitchen oxyCODONE-acetaminophen (PERCOCET/ROXICET) 5-325 MG tablet Take 1 tablet by mouth every 4 (four) hours as needed for moderate pain. 15 tablet 0  . prednisoLONE acetate (PRED FORTE) 1 % ophthalmic suspension Place 1 drop into the left eye daily.    . timolol (TIMOPTIC) 0.5 % ophthalmic solution Place 1 drop into the left eye 2 (two) times daily.     No current facility-administered medications for this visit.     Allergies:   Hydrochlorothiazide    Social History:  The patient  reports that he quit smoking about 37 years ago. he has never used smokeless tobacco. He reports that he drinks alcohol. He reports that he does not use drugs.   Family History:  The patient's family history includes Ovarian cancer in his mother.    ROS:  Please see the history of present illness.   Otherwise, review of systems are positive for none.   All other systems are reviewed and negative.    PHYSICAL EXAM: VS:  BP 136/90   Pulse 60   Ht 6\' 1"  (1.854 m)   Wt 171 lb 9.6 oz (77.8 kg)   BMI 22.64 kg/m  , BMI Body mass index is 22.64 kg/m. GENERAL:  Well appearing HEENT: Pupils equal round and reactive, fundi not visualized, oral mucosa unremarkable NECK:  No jugular venous distention, waveform within normal limits, carotid upstroke brisk and symmetric, no bruits LUNGS:  Clear to auscultation bilaterally HEART:  RRR.  PMI not displaced or sustained,S1 and S2 within normal limits, no S3, no S4, no clicks, no rubs,  III/VI systolic murmur at the LUSB ABD:  Flat, positive bowel sounds normal in frequency in pitch, no bruits, no rebound, no guarding, no midline pulsatile mass, no hepatomegaly, no splenomegaly EXT:  2 plus pulses throughout, no edema, no cyanosis no clubbing SKIN:  No rashes no nodules NEURO:  Cranial nerves II through XII grossly intact, motor grossly intact throughout PSYCH:  Cognitively intact, oriented to person place and time   Recent Labs: 10/27/2016: ALT 19; BUN 23; Creatinine, Ser 0.77; Hemoglobin 14.2; Platelets 177; Potassium 3.7; Sodium 136    Lipid Panel No results found for: CHOL, TRIG, HDL, CHOLHDL, VLDL, LDLCALC, LDLDIRECT    Wt Readings from Last 3 Encounters:  03/21/17 171 lb 9.6 oz (77.8 kg)  10/27/16 170 lb (77.1 kg)  09/04/16 173 lb (78.5 kg)    EKG 03/21/17: Sinus rhythm.  Rate 60 bpm.  Left axis deviation.   Echo 11/01/14: Study Conclusions  - Left ventricle: The cavity size was normal. Wall thickness was increased in a pattern of moderate LVH. Systolic function was normal. The estimated ejection fraction was in the range of 55% to 60%. Wall motion was normal; there were no regional wall motion abnormalities. - Aortic valve: Normal appearing tissue AVR with no peri valvular regurgitation. - Mitral valve: There was mild regurgitation. - Left atrium: The atrium was mildly dilated. - Atrial septum: No defect or patent foramen ovale was identified.  ASSESSMENT AND PLAN:  # Hypertensive heart disease: BP is well-controlled at home and always elevated in the office.  Continue amlodipine, carvedilol, and lisinopril.  # Hyperlipidemia:   Continue atorvastatin.  We will get a copy of his lipids from his PCP.  His goal is less than 70.  # CAD: S/p CABG and doing well.  No angina.  Continue aspirin, carvedilol and atorvastatin.  Increase exercise 250 minutes/week.  # s/p AVR: Mr. Karaffa has a bioprosthetic AVR and is doing well.  He needs antibiotic  prophylaxis prior to dental procedures.  Current medicines are reviewed at length with the patient today.  The patient does not have concerns regarding medicines.  The following changes have been made:  none  Labs/ tests ordered today include:   No orders of the defined types were placed in this encounter.    Disposition:   FU with Aylinn Rydberg C. Oval Linsey, MD in 1 year.  Signed, [name], MD  03/21/2017 11:31 AM    [place]

## 2017-03-21 NOTE — Patient Instructions (Signed)
Medication Instructions:  MAGIC MOUTH [place] AS NEEDED   Labwork: NONE  Testing/Procedures: NONE  Follow-Up: Your physician wants you to follow-up in: [place] will receive a reminder letter in the mail two months in advance. If you don't receive a letter, please call our office to schedule the follow-up appointment.  If you need a refill on your cardiac medications before your next appointment, please call your pharmacy.

## 2017-03-21 NOTE — Addendum Note (Signed)
Addended by: Alvina Filbert B on: 03/21/2017 06:09 PM   Modules accepted: Orders

## 2017-04-14 DIAGNOSIS — Z961 Presence of intraocular lens: Secondary | ICD-10-CM | POA: Diagnosis not present

## 2017-04-14 DIAGNOSIS — Z947 Corneal transplant status: Secondary | ICD-10-CM | POA: Diagnosis not present

## 2017-04-14 DIAGNOSIS — H16122 Filamentary keratitis, left eye: Secondary | ICD-10-CM | POA: Diagnosis not present

## 2017-04-21 DIAGNOSIS — H4052X3 Glaucoma secondary to other eye disorders, left eye, severe stage: Secondary | ICD-10-CM | POA: Diagnosis not present

## 2017-04-25 ENCOUNTER — Other Ambulatory Visit: Payer: Self-pay | Admitting: *Deleted

## 2017-04-25 MED ORDER — ATORVASTATIN CALCIUM 40 MG PO TABS: 40.0000 mg | ORAL_TABLET | Freq: Every day | ORAL | 3 refills | Status: DC

## 2017-04-25 MED ORDER — AMLODIPINE BESYLATE 5 MG PO TABS: 5.0000 mg | ORAL_TABLET | Freq: Every day | ORAL | 3 refills | Status: DC

## 2017-04-25 MED ORDER — CARVEDILOL 6.25 MG PO TABS: 6.2500 mg | ORAL_TABLET | Freq: Two times a day (BID) | ORAL | 3 refills | Status: DC

## 2017-04-25 NOTE — Telephone Encounter (Signed)
Rx has been sent to the pharmacy electronically. ° °

## 2017-05-27 IMAGING — DX DG CHEST 1V PORT
1 series · 1 of 1 positions shown · non-contrast
Comparison: 09/29/2014.

CLINICAL DATA: CABG.

EXAM:
PORTABLE CHEST - 1 VIEW

[chest ap]
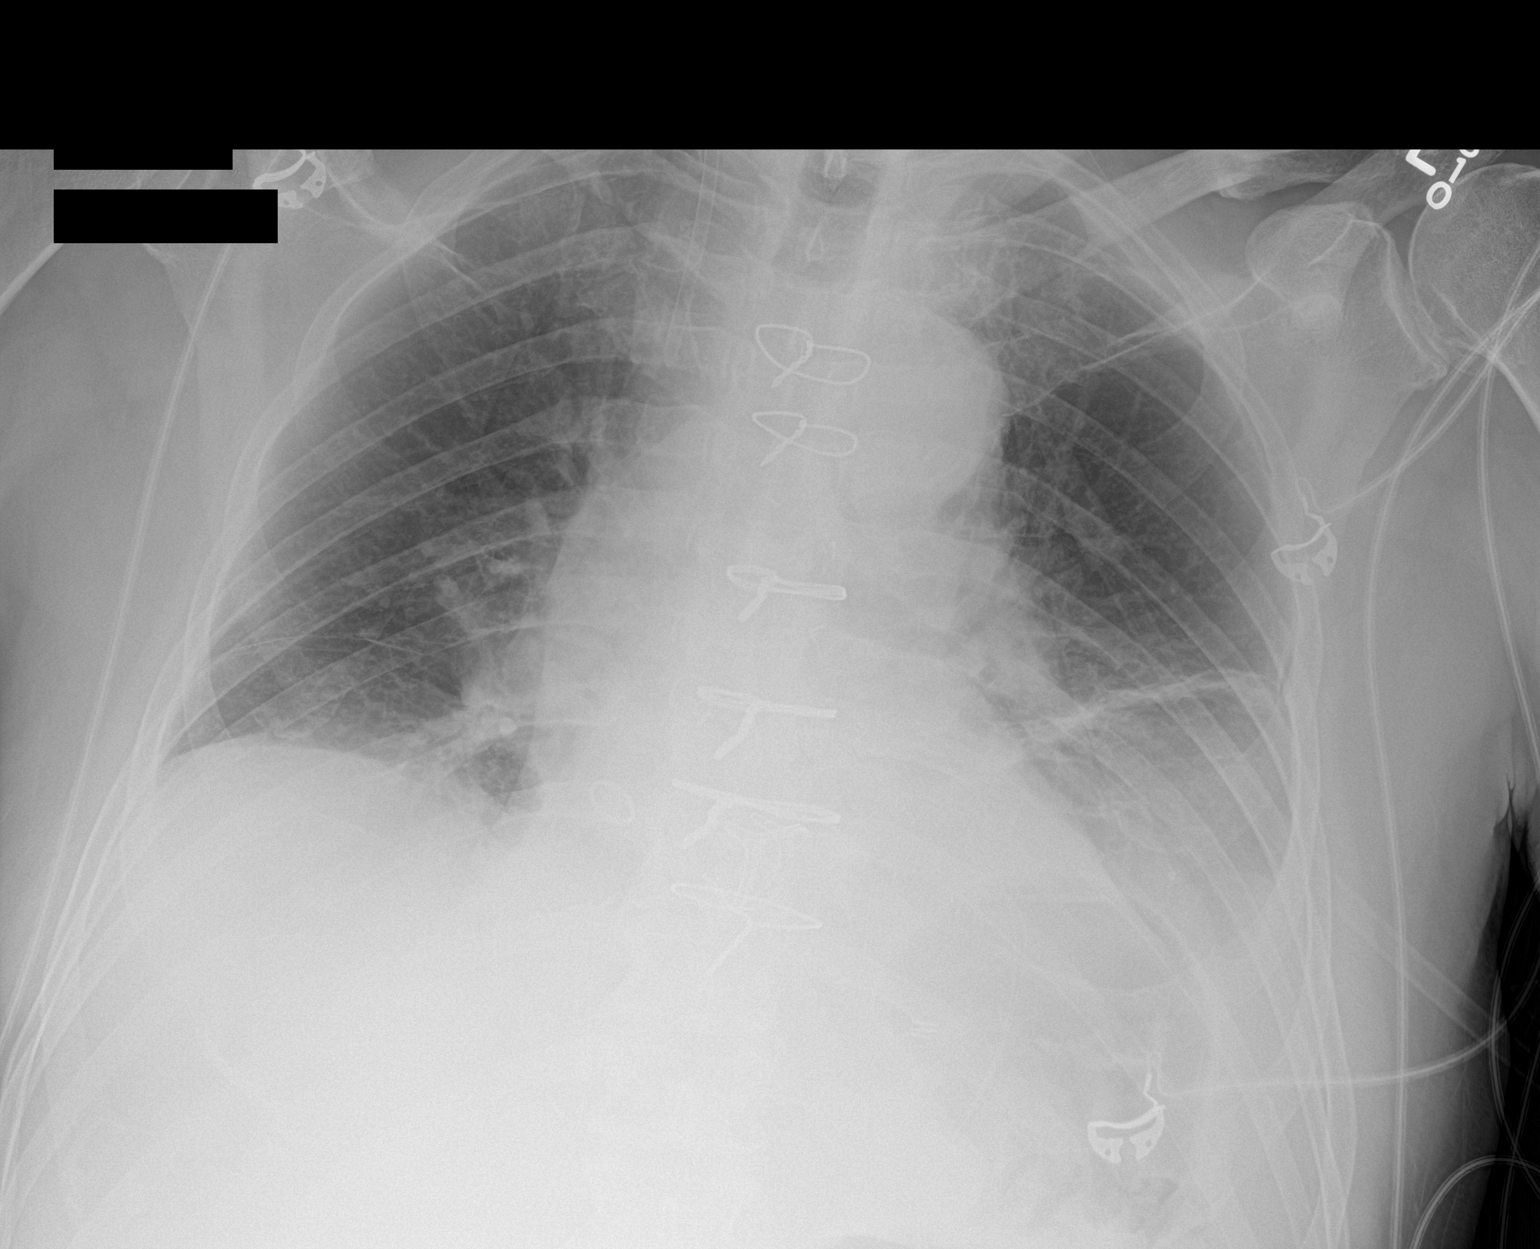

[1 of 1 positions shown; findings below may reference images not displayed]

FINDINGS: Right IJ sheath patch that interim removal of Swan-Ganz catheter,
left chest tube, mediastinal drainage catheter. Right IJ sheath in
stable position. Prior CABG. Prior cardiac valve replacement. Stable
cardiomegaly. Very mild increase in interstitial prominence, very
mild CHF cannot be completely excluded. Low lung volumes with
bibasilar atelectasis. Small left pleural effusion. No pneumothorax.
IMPRESSION: 1. Interim removal of Swan-Ganz catheter, left chest tube,
mediastinal drainage catheter. Right IJ sheath in stable position.
2. Prior CABG. Prior cardiac valve replacement. Stable cardiomegaly.
Very mild increase in interstitial prominence. Very mild congestive
heart failure cannot be completely excluded.
3. Low lung volumes with bibasilar atelectasis. Small left pleural
effusion.

## 2017-05-28 DIAGNOSIS — K219 Gastro-esophageal reflux disease without esophagitis: Secondary | ICD-10-CM | POA: Diagnosis not present

## 2017-05-28 DIAGNOSIS — I517 Cardiomegaly: Secondary | ICD-10-CM | POA: Diagnosis not present

## 2017-05-28 DIAGNOSIS — E7800 Pure hypercholesterolemia, unspecified: Secondary | ICD-10-CM | POA: Diagnosis not present

## 2017-05-28 DIAGNOSIS — Z0000 Encounter for general adult medical examination without abnormal findings: Secondary | ICD-10-CM | POA: Diagnosis not present

## 2017-05-28 DIAGNOSIS — Z8546 Personal history of malignant neoplasm of prostate: Secondary | ICD-10-CM | POA: Diagnosis not present

## 2017-05-28 DIAGNOSIS — N182 Chronic kidney disease, stage 2 (mild): Secondary | ICD-10-CM | POA: Diagnosis not present

## 2017-05-28 DIAGNOSIS — N529 Male erectile dysfunction, unspecified: Secondary | ICD-10-CM | POA: Diagnosis not present

## 2017-05-28 DIAGNOSIS — I1310 Hypertensive heart and chronic kidney disease without heart failure, with stage 1 through stage 4 chronic kidney disease, or unspecified chronic kidney disease: Secondary | ICD-10-CM | POA: Diagnosis not present

## 2017-05-28 DIAGNOSIS — I2510 Atherosclerotic heart disease of native coronary artery without angina pectoris: Secondary | ICD-10-CM | POA: Diagnosis not present

## 2017-07-19 IMAGING — CR DG CHEST 2V
2 series · 2 of 2 positions shown · non-contrast
Comparison: PA and lateral chest x-ray October 25, 2014

CLINICAL DATA: Aortic valve replacement and CABG in September 2014,
doing well, history of previous tobacco use.

EXAM:
CHEST  2 VIEW

[view not recorded (1 of 2)]
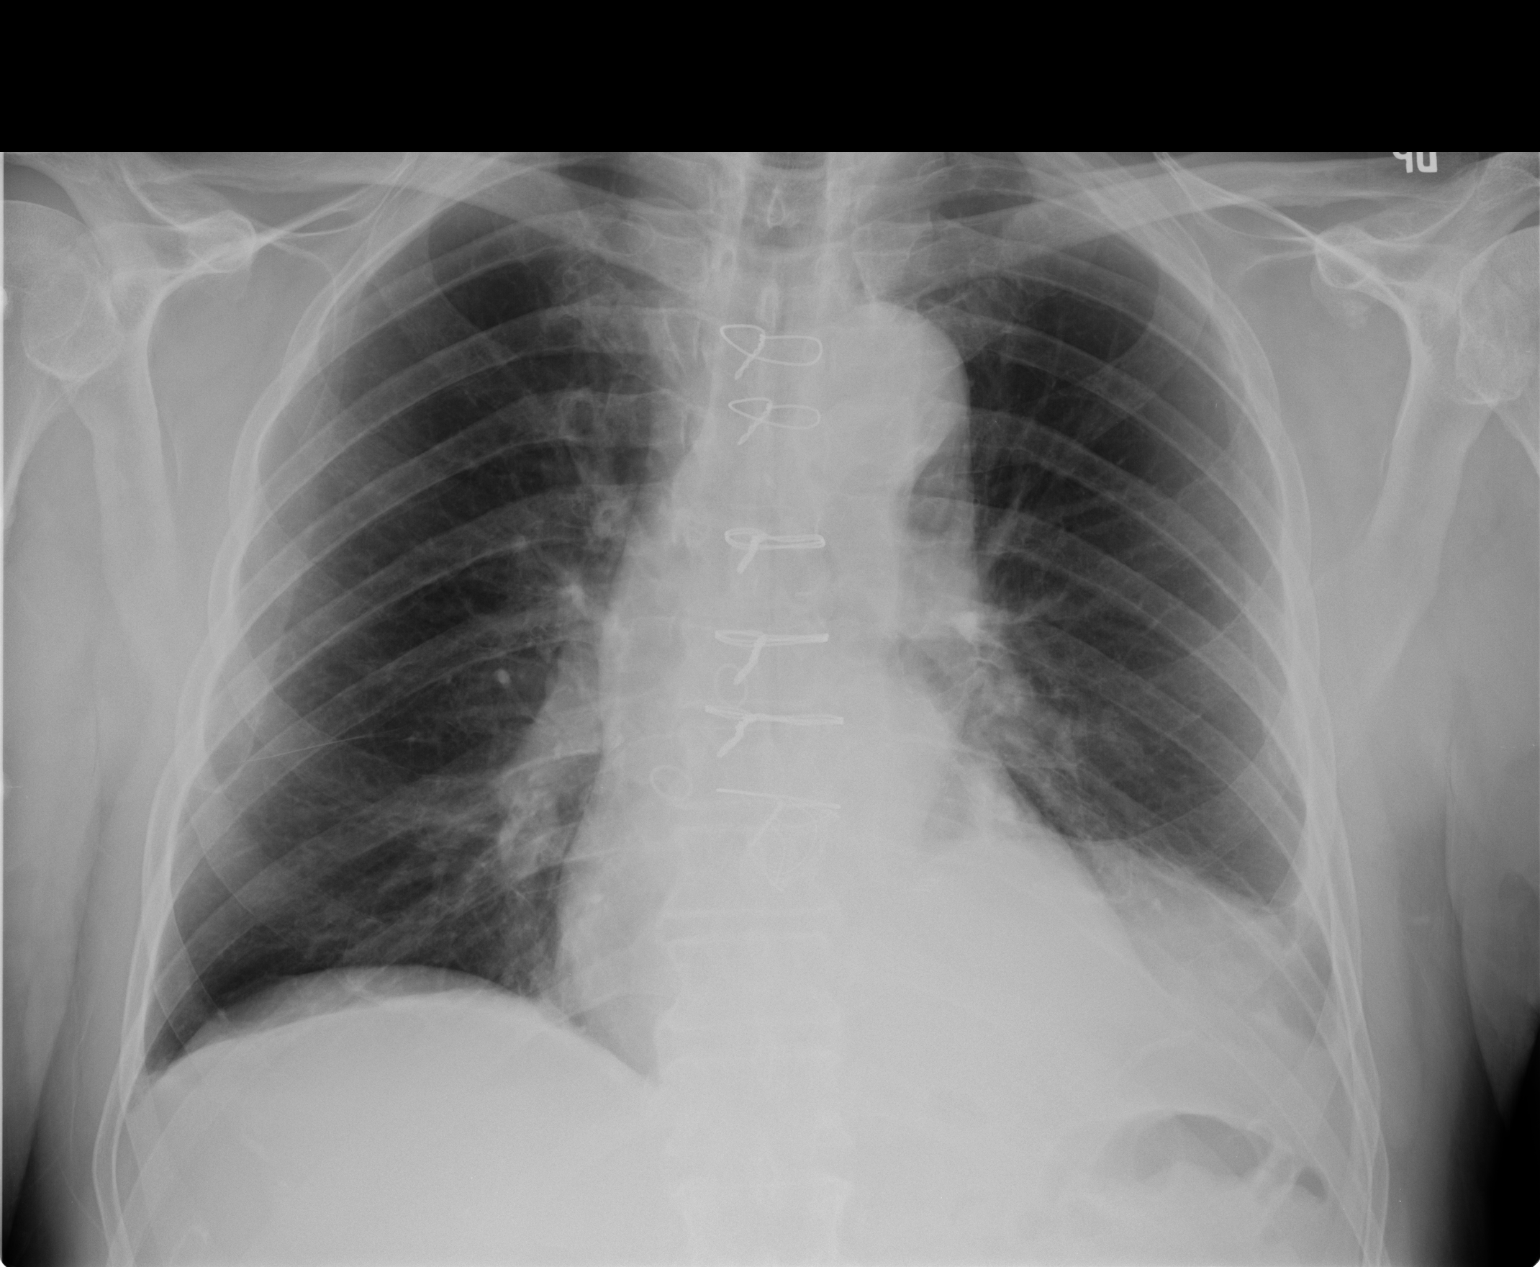

[view not recorded (2 of 2)]
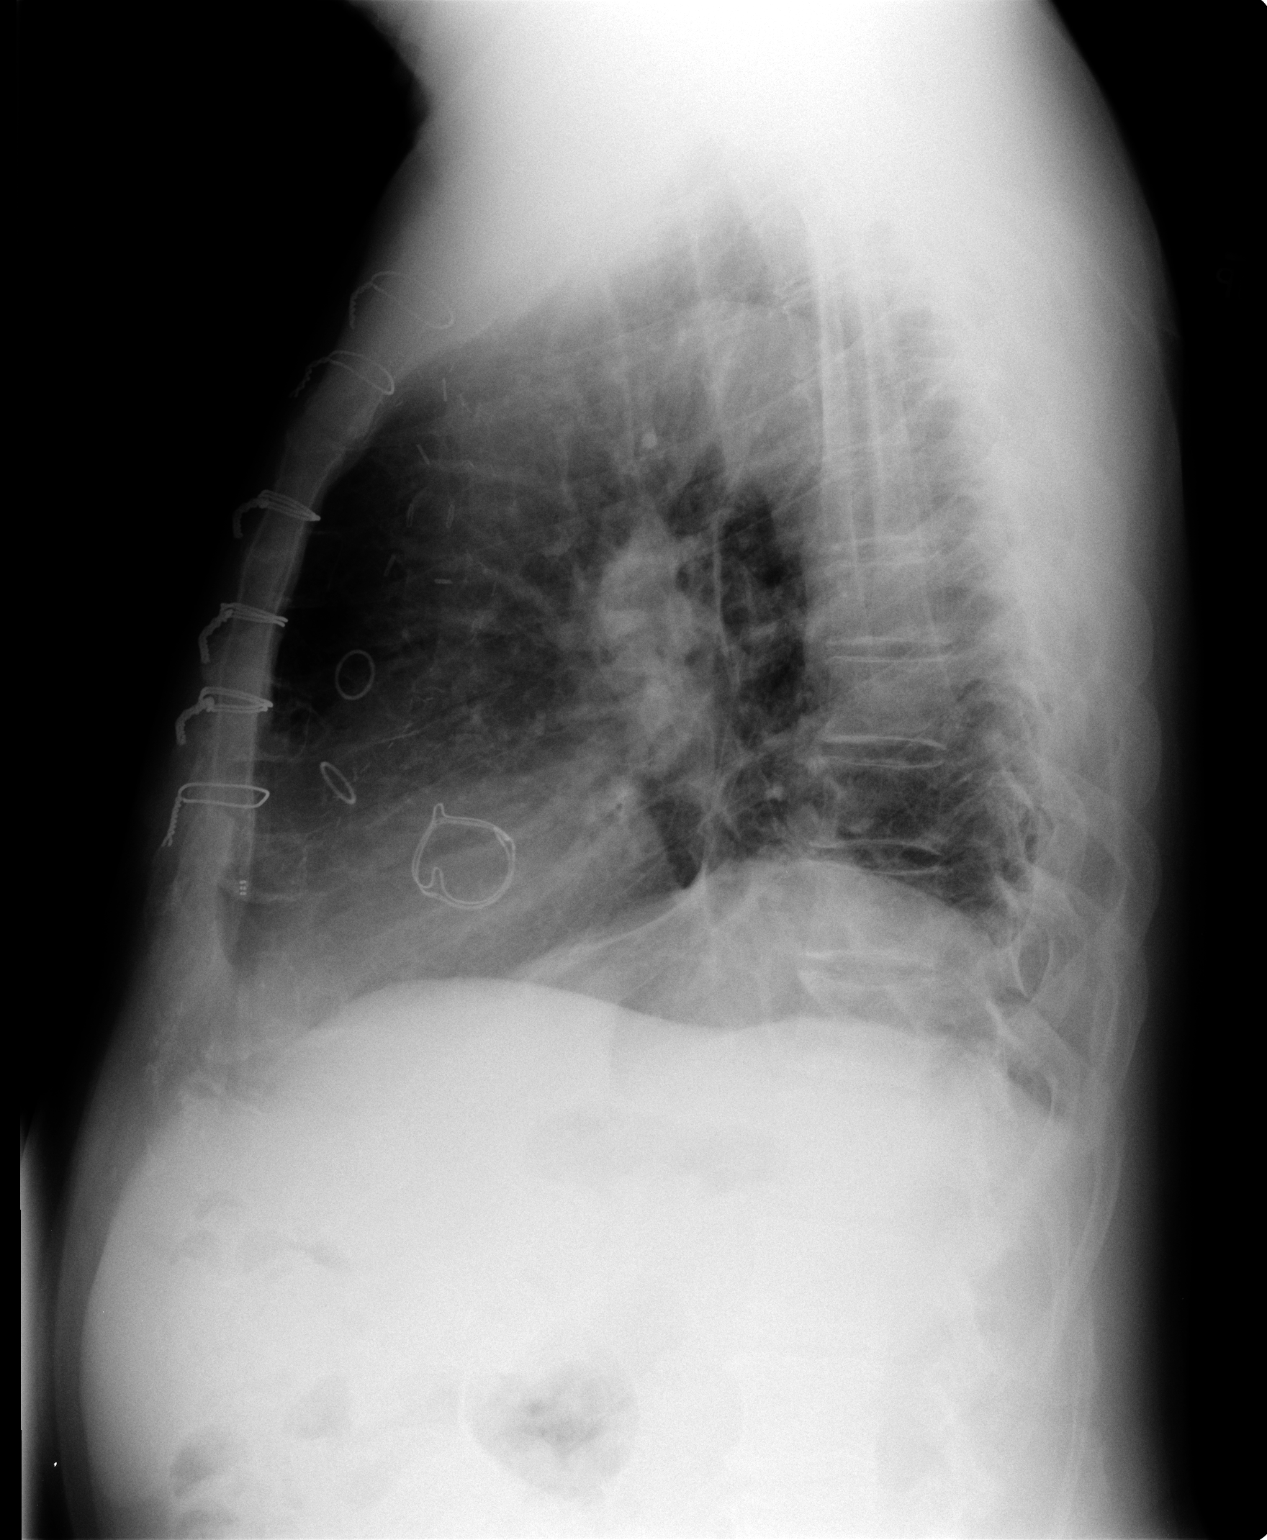

[2 of 2 positions shown; findings below may reference images not displayed]

FINDINGS: The right lung is clear. On the left there is persistent increased
density at the lung base which appears to reflect the posterior
aspect of the hemidiaphragm. There is no significant residual
atelectasis and no more than a trace of pleural fluid. There is no
pneumothorax. The heart and pulmonary vascularity are normal. The
aortic valve ring and 2 coronary artery bypass graft markers are
well demonstrated. There are 6 intact sternal wires. There is mild
tortuosity of the descending thoracic aorta. The retrosternal soft
tissues are normal.
IMPRESSION: Further interval decrease in left lower lobe atelectasis and pleural
fluid. A small amount of pleural fluid or pleural thickening
remains. There is no evidence of CHF.

## 2017-07-21 ENCOUNTER — Other Ambulatory Visit: Payer: Self-pay | Admitting: Cardiovascular Disease

## 2017-08-11 DIAGNOSIS — N5201 Erectile dysfunction due to arterial insufficiency: Secondary | ICD-10-CM | POA: Diagnosis not present

## 2017-08-11 DIAGNOSIS — Z8546 Personal history of malignant neoplasm of prostate: Secondary | ICD-10-CM | POA: Diagnosis not present

## 2017-08-11 DIAGNOSIS — R3915 Urgency of urination: Secondary | ICD-10-CM | POA: Diagnosis not present

## 2017-10-17 DIAGNOSIS — N529 Male erectile dysfunction, unspecified: Secondary | ICD-10-CM | POA: Diagnosis not present

## 2017-10-17 DIAGNOSIS — Z23 Encounter for immunization: Secondary | ICD-10-CM | POA: Diagnosis not present

## 2017-10-22 DIAGNOSIS — H16122 Filamentary keratitis, left eye: Secondary | ICD-10-CM | POA: Diagnosis not present

## 2017-10-22 DIAGNOSIS — Z947 Corneal transplant status: Secondary | ICD-10-CM | POA: Diagnosis not present

## 2017-10-22 DIAGNOSIS — Z961 Presence of intraocular lens: Secondary | ICD-10-CM | POA: Diagnosis not present

## 2017-10-28 DIAGNOSIS — L821 Other seborrheic keratosis: Secondary | ICD-10-CM | POA: Diagnosis not present

## 2017-10-28 DIAGNOSIS — Z85828 Personal history of other malignant neoplasm of skin: Secondary | ICD-10-CM | POA: Diagnosis not present

## 2017-10-28 DIAGNOSIS — D485 Neoplasm of uncertain behavior of skin: Secondary | ICD-10-CM | POA: Diagnosis not present

## 2017-10-28 DIAGNOSIS — L814 Other melanin hyperpigmentation: Secondary | ICD-10-CM | POA: Diagnosis not present

## 2017-10-28 DIAGNOSIS — B351 Tinea unguium: Secondary | ICD-10-CM | POA: Diagnosis not present

## 2017-10-28 DIAGNOSIS — L570 Actinic keratosis: Secondary | ICD-10-CM | POA: Diagnosis not present

## 2017-11-25 DIAGNOSIS — I2510 Atherosclerotic heart disease of native coronary artery without angina pectoris: Secondary | ICD-10-CM | POA: Diagnosis not present

## 2017-11-25 DIAGNOSIS — E7800 Pure hypercholesterolemia, unspecified: Secondary | ICD-10-CM | POA: Diagnosis not present

## 2017-11-25 DIAGNOSIS — N182 Chronic kidney disease, stage 2 (mild): Secondary | ICD-10-CM | POA: Diagnosis not present

## 2017-11-25 DIAGNOSIS — I1310 Hypertensive heart and chronic kidney disease without heart failure, with stage 1 through stage 4 chronic kidney disease, or unspecified chronic kidney disease: Secondary | ICD-10-CM | POA: Diagnosis not present

## 2017-12-15 DIAGNOSIS — Z85828 Personal history of other malignant neoplasm of skin: Secondary | ICD-10-CM | POA: Diagnosis not present

## 2017-12-15 DIAGNOSIS — L814 Other melanin hyperpigmentation: Secondary | ICD-10-CM | POA: Diagnosis not present

## 2018-02-23 DIAGNOSIS — H4052X3 Glaucoma secondary to other eye disorders, left eye, severe stage: Secondary | ICD-10-CM | POA: Diagnosis not present

## 2018-02-23 DIAGNOSIS — H4052X4 Glaucoma secondary to other eye disorders, left eye, indeterminate stage: Secondary | ICD-10-CM | POA: Diagnosis not present

## 2018-03-17 DIAGNOSIS — M25512 Pain in left shoulder: Secondary | ICD-10-CM | POA: Diagnosis not present

## 2018-03-17 DIAGNOSIS — M25511 Pain in right shoulder: Secondary | ICD-10-CM | POA: Diagnosis not present

## 2018-03-23 ENCOUNTER — Encounter: Payer: Self-pay | Admitting: Cardiovascular Disease

## 2018-03-23 ENCOUNTER — Ambulatory Visit: Payer: Medicare HMO | Admitting: Cardiovascular Disease

## 2018-03-23 VITALS — BP 156/84 | HR 54 | Ht 73.0 in | Wt 177.8 lb

## 2018-03-23 DIAGNOSIS — Z951 Presence of aortocoronary bypass graft: Secondary | ICD-10-CM | POA: Diagnosis not present

## 2018-03-23 DIAGNOSIS — I119 Hypertensive heart disease without heart failure: Secondary | ICD-10-CM | POA: Diagnosis not present

## 2018-03-23 DIAGNOSIS — I83891 Varicose veins of right lower extremities with other complications: Secondary | ICD-10-CM | POA: Diagnosis not present

## 2018-03-23 DIAGNOSIS — E7800 Pure hypercholesterolemia, unspecified: Secondary | ICD-10-CM | POA: Diagnosis not present

## 2018-03-23 DIAGNOSIS — I10 Essential (primary) hypertension: Secondary | ICD-10-CM | POA: Diagnosis not present

## 2018-03-23 DIAGNOSIS — Z952 Presence of prosthetic heart valve: Secondary | ICD-10-CM

## 2018-03-23 NOTE — Patient Instructions (Signed)
Medication Instructions:  Your physician recommends that you continue on your current medications as directed. Please refer to the Current Medication list given to you today.  If you need a refill on your cardiac medications before your next appointment, please call your pharmacy.   Lab work: NONE   Testing/Procedures: Your physician has requested that you have a lower or upper extremity venous duplex. This test is an ultrasound of the veins in the legs or arms. It looks at venous blood flow that carries blood from the heart to the legs or arms. Allow one hour for a Lower Venous exam. Allow thirty minutes for an Upper Venous exam. There are no restrictions or special instructions. SOON  Follow-Up: At [facility], you and your health needs are our priority.  As part of our continuing mission to provide you with exceptional heart care, we have created designated Provider Care Teams.  These Care Teams include your primary Cardiologist (physician) and Advanced Practice Providers (APPs -  Physician Assistants and Nurse Practitioners) who all work together to provide you with the care you need, when you need it. You will need a follow up appointment in 12 months.  Please call our office 2 months in advance to schedule this appointment.  You may see [name], MD or one of the following Advanced Practice Providers on your designated Care Team:   Kerin Ransom, PA-C Roby Lofts, [place] . Sande Rives, PA-C

## 2018-03-23 NOTE — Addendum Note (Signed)
Addended by: Alvina Filbert B on: 03/23/2018 05:57 PM   Modules accepted: Orders

## 2018-03-23 NOTE — Progress Notes (Signed)
Cardiology Office Note   Date:  03/23/2018   ID:  [name], [name] 04-Jul-1940, MRN [id number]  PCP:  [name] Neer, MD  Cardiologist:   [name] Latch, MD  CT Surgeon: Dr. Roxan [name]  No chief complaint on file.   Patient ID: [name] [name] is a 78 y.o. male with hypertension, CAD s/p CABG (LIMA-->LAD, SVG-->RI/OM1, SVG-->PDA), aortic stenosis s/p AVR, bradycardia, and syncope who presents for follow up.  History of Present Illness:  [name] [name] underwent 4 vessel CABG and AVR (25 mm [facility] Ease pericardial tissue valve) on 09/28/14.  His post-operative course was complicated by atrial fibrillation/flutter, but he was discharged in sinus rhythm.  He had an episode of lightheadedness in 08/2015 and was seen in the ED.  He was noted to have acute renal failure and was felt to be dehydrated. He had been working in the yard for several hours the day before and was not well hydrated.  Metoprolol was switched to carvedilol due to bradycardia and hypertension.  He was also started on amlodipine, so simvastatin was switched to atorvastatin. At his last appointment his blood pressure was elevated but it has been well-controlled at home so no changes were made to his regimen  Since his last appointment [name] [name] has been doing well.  His main complaints today are orthopedic.  He reports R shoulder pain for the last month.  He was been going to PT and will see orthopedics in one week.  He also has several dental procedures.  His BP at home has ranged from 111-154/60-60.  It is typically around the 120s.  He isn't exercising much due to the weather but plans to start back walking soon.  He has no exertional chest pain or shortness of breath.  He notes that his R LE has been swollen for the last month.  He denies prior travel or calf pain. He has no orthopnea or PND.    Past Medical History:  Diagnosis Date  . Cataracts, bilateral   . GERD (gastroesophageal reflux disease)   . Glaucoma     . Hyperlipidemia   . Hypertension   . Shingles   . Varicose veins 03/31/2015    Past Surgical History:  Procedure Laterality Date  . AORTIC VALVE REPLACEMENT N/A 09/28/2014   Procedure: AORTIC VALVE REPLACEMENT (AVR);  Surgeon: [name] Nakayama, MD;  Location: [place];  Service: Open Heart Surgery;  Laterality: N/A;  . CARDIAC CATHETERIZATION N/A 09/23/2014   Procedure: Left Heart Cath and Coronary Angiography;  Surgeon: [name] Man, MD;  Location: [place] [facility];  Service: Cardiovascular;  Laterality: N/A;  . CATARACT EXTRACTION    . CHOLECYSTECTOMY N/A 10/28/2016   Procedure: LAPAROSCOPIC CHOLECYSTECTOMY;  Surgeon: [name] Ok, MD;  Location: [place];  Service: General;  Laterality: N/A;  . CORONARY ARTERY BYPASS GRAFT N/A 09/28/2014   Procedure: CORONARY ARTERY BYPASS GRAFTING (CABG) x4 using left internal mammory artery and right saphenous leg vein harvested endoscopically.;  Surgeon: [name] Nakayama, MD;  Location: [place];  Service: Open Heart Surgery;  Laterality: N/A;  . PROSTATECTOMY    . TEE WITHOUT CARDIOVERSION N/A 09/28/2014   Procedure: TRANSESOPHAGEAL ECHOCARDIOGRAM (TEE);  Surgeon: [name] Nakayama, MD;  Location: [place];  Service: Open Heart Surgery;  Laterality: N/A;     Current Outpatient Medications  Medication Sig Dispense Refill  . acetaminophen (TYLENOL) 500 MG tablet Take 1,000 mg by mouth every 6 (six) hours as needed for headache (pain).     Marland Kitchen  acyclovir (ZOVIRAX) 400 MG tablet Take 400 mg by mouth 2 (two) times daily.    Marland Kitchen amLODipine (NORVASC) 5 MG tablet Take 1 tablet (5 mg total) by mouth daily. 90 tablet 3  . aspirin EC 81 MG tablet Take 1 tablet (81 mg total) by mouth daily. (Patient taking differently: Take 81 mg by mouth at bedtime. ) 90 tablet 3  . atorvastatin (LIPITOR) 40 MG tablet Take 1 tablet (40 mg total) by mouth at bedtime. 90 tablet 3  . carvedilol (COREG) 6.25 MG tablet Take 1 tablet (6.25 mg total) by mouth 2 (two) times daily. 180  tablet 3  . esomeprazole (NEXIUM) 20 MG capsule Take 20 mg by mouth every other day. Reported on 03/01/2015    . lisinopril (PRINIVIL,ZESTRIL) 20 MG tablet Take 1 tablet (20 mg total) by mouth 2 (two) times daily. 180 tablet 2  . meloxicam (MOBIC) 15 MG tablet Take 15 mg by mouth at bedtime.     . Multiple Vitamins-Minerals (MULTIVITAMIN WITH MINERALS) tablet Take 1 tablet by mouth daily.    Marland Kitchen oxybutynin (DITROPAN) 5 MG tablet Take 5 mg by mouth at bedtime.    . prednisoLONE acetate (PRED FORTE) 1 % ophthalmic suspension Place 1 drop into the left eye daily.    . timolol (TIMOPTIC) 0.5 % ophthalmic solution Place 1 drop into the left eye 2 (two) times daily.     No current facility-administered medications for this visit.     Allergies:   Hydrochlorothiazide    Social History:  The patient  reports that he quit smoking about 38 years ago. He has never used smokeless tobacco. He reports current alcohol use. He reports that he does not use drugs.   Family History:  The patient's family history includes Ovarian cancer in his mother.    ROS:  Please see the history of present illness.   Otherwise, review of systems are positive for none.   All other systems are reviewed and negative.    PHYSICAL EXAM: VS:  BP (!) 156/84   Pulse (!) 54   Ht 6\' 1"  (1.854 m)   Wt 177 lb 12.8 oz (80.6 kg)   BMI 23.46 kg/m  , BMI Body mass index is 23.46 kg/m. GENERAL:  Well appearing HEENT: Pupils equal round and reactive, fundi not visualized, oral mucosa unremarkable NECK:  No jugular venous distention, waveform within normal limits, carotid upstroke brisk and symmetric, no bruits LUNGS:  Clear to auscultation bilaterally HEART:  RRR.  PMI not displaced or sustained,S1 and S2 within normal limits, no S3, no S4, no clicks, no rubs, III/VI systolic murmur at the LUSB ABD:  Flat, positive bowel sounds normal in frequency in pitch, no bruits, no rebound, no guarding, no midline pulsatile mass, no  hepatomegaly, no splenomegaly EXT:  2 plus pulses throughout, no edema, no cyanosis no clubbing SKIN:  No rashes no nodules NEURO:  Cranial nerves II through XII grossly intact, motor grossly intact throughout PSYCH:  Cognitively intact, oriented to person place and time   Recent Labs: No results found for requested labs within last 8760 hours.   11/25/2017: Total cholesterol 114, triglycerides 91, HDL 40, LDL 55 Potassium 4.9, creatinine 0.9 Lipid Panel No results found for: CHOL, TRIG, HDL, CHOLHDL, VLDL, LDLCALC, LDLDIRECT    Wt Readings from Last 3 Encounters:  03/23/18 177 lb 12.8 oz (80.6 kg)  03/21/17 171 lb 9.6 oz (77.8 kg)  10/27/16 170 lb (77.1 kg)    EKG 03/21/17: Sinus rhythm.  Rate 60 bpm.  Left axis deviation.  03/23/18: Sinus bradycardia.  Rate 54 bpm.  First degree AV block.  LAFB.    Echo 11/01/14: Study Conclusions  - Left ventricle: The cavity size was normal. Wall thickness was increased in a pattern of moderate LVH. Systolic function was normal. The estimated ejection fraction was in the range of 55% to 60%. Wall motion was normal; there were no regional wall motion abnormalities. - Aortic valve: Normal appearing tissue AVR with no peri valvular regurgitation. - Mitral valve: There was mild regurgitation. - Left atrium: The atrium was mildly dilated. - Atrial septum: No defect or patent foramen ovale was identified.  ASSESSMENT AND PLAN:  # Hypertensive heart disease: BP is well-controlled at home and always elevated in the office.  Continue amlodipine, carvedilol, and lisinopril.  # Hyperlipidemia:   Continue atorvastatin.  LDL was 55 11/2017.  # CAD: S/p CABG and doing well.  No angina.  Continue aspirin, carvedilol and atorvastatin.  Increase exercise 250 minutes/week.  # s/p AVR: Mr. Mckone has a bioprosthetic AVR and is doing well.  He needs antibiotic prophylaxis prior to dental procedures.  # LE Edema: Check R LE ultrasound for DVT.   Recommend compression sock.  Current medicines are reviewed at length with the patient today.  The patient does not have concerns regarding medicines.  The following changes have been made:  none  Labs/ tests ordered today include:   No orders of the defined types were placed in this encounter.    Disposition:   FU with Teiara Baria C. Oval Linsey, MD in 1 year.  Signed, [name] Latch, MD  03/23/2018 1:29 PM    [facility]

## 2018-03-24 DIAGNOSIS — M25511 Pain in right shoulder: Secondary | ICD-10-CM | POA: Diagnosis not present

## 2018-03-24 DIAGNOSIS — M25512 Pain in left shoulder: Secondary | ICD-10-CM | POA: Diagnosis not present

## 2018-03-25 ENCOUNTER — Ambulatory Visit (HOSPITAL_COMMUNITY)
Admission: RE | Admit: 2018-03-25 | Discharge: 2018-03-25 | Disposition: A | Discharge status: Home / Self Care | Payer: Medicare HMO | Source: Ambulatory Visit | Attending: Cardiovascular Disease | Admitting: Cardiovascular Disease

## 2018-03-25 DIAGNOSIS — I83891 Varicose veins of right lower extremities with other complications: Secondary | ICD-10-CM | POA: Diagnosis not present

## 2018-03-31 DIAGNOSIS — M25511 Pain in right shoulder: Secondary | ICD-10-CM | POA: Diagnosis not present

## 2018-03-31 DIAGNOSIS — M25512 Pain in left shoulder: Secondary | ICD-10-CM | POA: Diagnosis not present

## 2018-04-01 DIAGNOSIS — M7541 Impingement syndrome of right shoulder: Secondary | ICD-10-CM | POA: Diagnosis not present

## 2018-04-01 DIAGNOSIS — M25511 Pain in right shoulder: Secondary | ICD-10-CM | POA: Diagnosis not present

## 2018-04-01 DIAGNOSIS — M25512 Pain in left shoulder: Secondary | ICD-10-CM | POA: Diagnosis not present

## 2018-04-01 DIAGNOSIS — M19012 Primary osteoarthritis, left shoulder: Secondary | ICD-10-CM | POA: Diagnosis not present

## 2018-05-05 ENCOUNTER — Other Ambulatory Visit: Payer: Self-pay | Admitting: Cardiovascular Disease

## 2018-05-05 ENCOUNTER — Other Ambulatory Visit: Payer: Self-pay | Admitting: Internal Medicine

## 2018-05-05 NOTE — Telephone Encounter (Signed)
Lisinopril refilled.

## 2018-06-12 DIAGNOSIS — L814 Other melanin hyperpigmentation: Secondary | ICD-10-CM | POA: Diagnosis not present

## 2018-06-12 DIAGNOSIS — L570 Actinic keratosis: Secondary | ICD-10-CM | POA: Diagnosis not present

## 2018-06-12 DIAGNOSIS — L821 Other seborrheic keratosis: Secondary | ICD-10-CM | POA: Diagnosis not present

## 2018-06-17 DIAGNOSIS — M7541 Impingement syndrome of right shoulder: Secondary | ICD-10-CM | POA: Diagnosis not present

## 2018-06-17 DIAGNOSIS — M19012 Primary osteoarthritis, left shoulder: Secondary | ICD-10-CM | POA: Diagnosis not present

## 2018-06-19 DIAGNOSIS — E7800 Pure hypercholesterolemia, unspecified: Secondary | ICD-10-CM | POA: Diagnosis not present

## 2018-06-19 DIAGNOSIS — I1310 Hypertensive heart and chronic kidney disease without heart failure, with stage 1 through stage 4 chronic kidney disease, or unspecified chronic kidney disease: Secondary | ICD-10-CM | POA: Diagnosis not present

## 2018-06-19 DIAGNOSIS — N182 Chronic kidney disease, stage 2 (mild): Secondary | ICD-10-CM | POA: Diagnosis not present

## 2018-06-19 DIAGNOSIS — Z0000 Encounter for general adult medical examination without abnormal findings: Secondary | ICD-10-CM | POA: Diagnosis not present

## 2018-06-19 DIAGNOSIS — I2510 Atherosclerotic heart disease of native coronary artery without angina pectoris: Secondary | ICD-10-CM | POA: Diagnosis not present

## 2018-06-19 DIAGNOSIS — K219 Gastro-esophageal reflux disease without esophagitis: Secondary | ICD-10-CM | POA: Diagnosis not present

## 2018-06-19 DIAGNOSIS — N529 Male erectile dysfunction, unspecified: Secondary | ICD-10-CM | POA: Diagnosis not present

## 2018-06-19 DIAGNOSIS — I517 Cardiomegaly: Secondary | ICD-10-CM | POA: Diagnosis not present

## 2018-06-19 DIAGNOSIS — Z8546 Personal history of malignant neoplasm of prostate: Secondary | ICD-10-CM | POA: Diagnosis not present

## 2018-08-07 DIAGNOSIS — Z8546 Personal history of malignant neoplasm of prostate: Secondary | ICD-10-CM | POA: Diagnosis not present

## 2018-08-14 DIAGNOSIS — N5201 Erectile dysfunction due to arterial insufficiency: Secondary | ICD-10-CM | POA: Diagnosis not present

## 2018-08-14 DIAGNOSIS — Z8546 Personal history of malignant neoplasm of prostate: Secondary | ICD-10-CM | POA: Diagnosis not present

## 2018-08-14 DIAGNOSIS — R3915 Urgency of urination: Secondary | ICD-10-CM | POA: Diagnosis not present

## 2018-09-14 DIAGNOSIS — H4052X4 Glaucoma secondary to other eye disorders, left eye, indeterminate stage: Secondary | ICD-10-CM | POA: Diagnosis not present

## 2018-09-14 DIAGNOSIS — H4052X3 Glaucoma secondary to other eye disorders, left eye, severe stage: Secondary | ICD-10-CM | POA: Diagnosis not present

## 2018-09-14 DIAGNOSIS — Z947 Corneal transplant status: Secondary | ICD-10-CM | POA: Diagnosis not present

## 2018-10-24 DIAGNOSIS — Z23 Encounter for immunization: Secondary | ICD-10-CM | POA: Diagnosis not present

## 2018-10-29 ENCOUNTER — Other Ambulatory Visit: Payer: Self-pay | Admitting: Cardiovascular Disease

## 2018-10-29 NOTE — Telephone Encounter (Signed)
Rx request sent to pharmacy.  

## 2018-12-04 DIAGNOSIS — M25562 Pain in left knee: Secondary | ICD-10-CM | POA: Diagnosis not present

## 2018-12-16 DIAGNOSIS — I1310 Hypertensive heart and chronic kidney disease without heart failure, with stage 1 through stage 4 chronic kidney disease, or unspecified chronic kidney disease: Secondary | ICD-10-CM | POA: Diagnosis not present

## 2018-12-16 DIAGNOSIS — N182 Chronic kidney disease, stage 2 (mild): Secondary | ICD-10-CM | POA: Diagnosis not present

## 2018-12-16 DIAGNOSIS — E7800 Pure hypercholesterolemia, unspecified: Secondary | ICD-10-CM | POA: Diagnosis not present

## 2018-12-21 DIAGNOSIS — N182 Chronic kidney disease, stage 2 (mild): Secondary | ICD-10-CM | POA: Diagnosis not present

## 2018-12-21 DIAGNOSIS — I1310 Hypertensive heart and chronic kidney disease without heart failure, with stage 1 through stage 4 chronic kidney disease, or unspecified chronic kidney disease: Secondary | ICD-10-CM | POA: Diagnosis not present

## 2018-12-21 DIAGNOSIS — Z7189 Other specified counseling: Secondary | ICD-10-CM | POA: Diagnosis not present

## 2018-12-21 DIAGNOSIS — I2510 Atherosclerotic heart disease of native coronary artery without angina pectoris: Secondary | ICD-10-CM | POA: Diagnosis not present

## 2018-12-21 DIAGNOSIS — E7800 Pure hypercholesterolemia, unspecified: Secondary | ICD-10-CM | POA: Diagnosis not present

## 2019-01-08 DIAGNOSIS — M25562 Pain in left knee: Secondary | ICD-10-CM | POA: Diagnosis not present

## 2019-01-08 DIAGNOSIS — M170 Bilateral primary osteoarthritis of knee: Secondary | ICD-10-CM | POA: Diagnosis not present

## 2019-01-18 DIAGNOSIS — R509 Fever, unspecified: Secondary | ICD-10-CM | POA: Diagnosis not present

## 2019-01-29 ENCOUNTER — Other Ambulatory Visit: Payer: Self-pay

## 2019-01-29 MED ORDER — CARVEDILOL 6.25 MG PO TABS: 6.2500 mg | ORAL_TABLET | Freq: Two times a day (BID) | ORAL | 2 refills | Status: DC

## 2019-01-29 MED ORDER — AMLODIPINE BESYLATE 5 MG PO TABS: 5.0000 mg | ORAL_TABLET | Freq: Every day | ORAL | 2 refills | Status: DC

## 2019-01-29 MED ORDER — ATORVASTATIN CALCIUM 40 MG PO TABS: 40.0000 mg | ORAL_TABLET | Freq: Every day | ORAL | 2 refills | Status: DC

## 2019-02-04 DIAGNOSIS — M1712 Unilateral primary osteoarthritis, left knee: Secondary | ICD-10-CM | POA: Diagnosis not present

## 2019-02-04 DIAGNOSIS — M25562 Pain in left knee: Secondary | ICD-10-CM | POA: Diagnosis not present

## 2019-02-10 ENCOUNTER — Telehealth: Payer: Self-pay | Admitting: Cardiovascular Disease

## 2019-02-10 NOTE — Telephone Encounter (Signed)
We are recommending the COVID-19 vaccine to all of our patients. Cardiac medications (including blood thinners) should not deter anyone from being vaccinated and there is no need to hold any of those medications prior to vaccine administration.     Currently, there is a hotline to call (active 01/29/19) to schedule vaccination appointments as no walk-ins will be accepted.   Number: [phone].    If an appointment is not available please go to [email or web address] to sign up for notification when additional vaccine appointments are available.   If you have further questions or concerns about the vaccine process, please visit [email or web address] or contact your primary care physician.   

## 2019-02-11 DIAGNOSIS — M25562 Pain in left knee: Secondary | ICD-10-CM | POA: Diagnosis not present

## 2019-02-11 DIAGNOSIS — M1712 Unilateral primary osteoarthritis, left knee: Secondary | ICD-10-CM | POA: Diagnosis not present

## 2019-02-18 DIAGNOSIS — M1712 Unilateral primary osteoarthritis, left knee: Secondary | ICD-10-CM | POA: Diagnosis not present

## 2019-03-02 DIAGNOSIS — K4090 Unilateral inguinal hernia, without obstruction or gangrene, not specified as recurrent: Secondary | ICD-10-CM | POA: Diagnosis not present

## 2019-03-03 DIAGNOSIS — H4052X3 Glaucoma secondary to other eye disorders, left eye, severe stage: Secondary | ICD-10-CM | POA: Diagnosis not present

## 2019-03-03 DIAGNOSIS — Z961 Presence of intraocular lens: Secondary | ICD-10-CM | POA: Diagnosis not present

## 2019-03-03 DIAGNOSIS — H5712 Ocular pain, left eye: Secondary | ICD-10-CM | POA: Diagnosis not present

## 2019-03-03 DIAGNOSIS — Z947 Corneal transplant status: Secondary | ICD-10-CM | POA: Diagnosis not present

## 2019-03-03 DIAGNOSIS — H16122 Filamentary keratitis, left eye: Secondary | ICD-10-CM | POA: Diagnosis not present

## 2019-03-22 DIAGNOSIS — K4090 Unilateral inguinal hernia, without obstruction or gangrene, not specified as recurrent: Secondary | ICD-10-CM

## 2019-03-22 HISTORY — DX: Unilateral inguinal hernia, without obstruction or gangrene, not specified as recurrent: K40.90

## 2019-03-23 ENCOUNTER — Encounter: Payer: Self-pay | Admitting: Cardiovascular Disease

## 2019-03-23 ENCOUNTER — Other Ambulatory Visit: Payer: Self-pay

## 2019-03-23 ENCOUNTER — Ambulatory Visit: Payer: Medicare HMO | Admitting: Cardiovascular Disease

## 2019-03-23 VITALS — BP 164/98 | HR 60 | Ht 73.0 in | Wt 164.4 lb

## 2019-03-23 DIAGNOSIS — I25110 Atherosclerotic heart disease of native coronary artery with unstable angina pectoris: Secondary | ICD-10-CM | POA: Diagnosis not present

## 2019-03-23 DIAGNOSIS — E7800 Pure hypercholesterolemia, unspecified: Secondary | ICD-10-CM | POA: Diagnosis not present

## 2019-03-23 DIAGNOSIS — I10 Essential (primary) hypertension: Secondary | ICD-10-CM

## 2019-03-23 DIAGNOSIS — Z953 Presence of xenogenic heart valve: Secondary | ICD-10-CM | POA: Diagnosis not present

## 2019-03-23 HISTORY — DX: Presence of xenogenic heart valve: Z95.3

## 2019-03-23 HISTORY — DX: Pure hypercholesterolemia, unspecified: E78.00

## 2019-03-23 MED ORDER — AMLODIPINE BESYLATE 10 MG PO TABS: 10.0000 mg | ORAL_TABLET | Freq: Every day | ORAL | 3 refills | Status: DC

## 2019-03-23 NOTE — Patient Instructions (Signed)
Medication Instructions:  INCREASE YOUR AMLODIPINE TO 10 MG DAILY   *If you need a refill on your cardiac medications before your next appointment, please call your pharmacy*  Lab Work: NONE   Testing/Procedures: NONE   Follow-Up: At [facility], you and your health needs are our priority.  As part of our continuing mission to provide you with exceptional heart care, we have created designated Provider Care Teams.  These Care Teams include your primary Cardiologist (physician) and Advanced Practice Providers (APPs -  Physician Assistants and Nurse Practitioners) who all work together to provide you with the care you need, when you need it.  We recommend signing up for the patient portal called "MyChart".  Sign up information is provided on this After Visit Summary.  MyChart is used to connect with patients for Virtual Visits (Telemedicine).  Patients are able to view lab/test results, encounter notes, upcoming appointments, etc.  Non-urgent messages can be sent to your provider as well.   To learn more about what you can do with MyChart, go to [email or web address].    Your next appointment:   3 month(s)  The format for your next appointment:   In Person  Provider:   You may see [name], MD or one of the following Advanced Practice Providers on your designated Care Team:    Kerin Ransom, PA-C  [place], [place]  [name], [place]

## 2019-03-23 NOTE — Progress Notes (Signed)
Cardiology Office Note   Date:  03/23/2019   ID:  Nixxon, Gilstrap 01-24-1940, MRN [id number]  PCP:  [name], MD  Cardiologist:   [name], MD  CT Surgeon: [name]  No chief complaint on file.   Patient ID: [name] [name] is a 79 y.o. male with hypertension, CAD s/p CABG (LIMA-->LAD, SVG-->RI/OM1, SVG-->PDA), aortic stenosis s/p AVR, bradycardia, and syncope who presents for follow up.  [name] [name] underwent 4 vessel CABG and AVR (25 mm [facility] Ease pericardial tissue valve) on 09/28/14.  His post-operative course was complicated by atrial fibrillation/flutter, but he was discharged in sinus rhythm.  He had an episode of lightheadedness in 08/2015 and was seen in the ED.  He was noted to have acute renal failure and was felt to be dehydrated. He had been working in the yard for several hours the day before and was not well hydrated.  Metoprolol was switched to carvedilol due to bradycardia and hypertension.  He was also started on amlodipine, so simvastatin was switched to atorvastatin.   Since his last appointment [name] [name] has been doing well.  He has not been checking his blood pressure as often lately, but when he does check it it is around 140 over 60s to 70s.  In general he has been feeling well.  He struggles with some left knee pain but this is improved since he has been getting injections.  He also has some right lower extremity edema that is chronic.  At last appointment he was sent for lower extremity Dopplers that were negative for DVT.  His PCP recommended a compression stocking which does seem to help when he wears it.  His breathing has been good and he has no chest pain.  He walks for exercise when he can but has not been doing this much lately due to the weather.  He has no exertional chest pain or shortness of breath.   Past Medical History:  Diagnosis Date  . Cataracts, bilateral   . GERD (gastroesophageal reflux disease)   . Glaucoma   .  Hyperlipidemia   . Hypertension   . Pure hypercholesterolemia 03/23/2019  . S/P aortic valve replacement with bioprosthetic valve 03/23/2019   Bioprosthetic 09/2014  . Shingles   . Varicose veins 03/31/2015    Past Surgical History:  Procedure Laterality Date  . AORTIC VALVE REPLACEMENT N/A 09/28/2014   Procedure: AORTIC VALVE REPLACEMENT (AVR);  Surgeon: [name], MD;  Location: [facility];  Service: Open Heart Surgery;  Laterality: N/A;  . CARDIAC CATHETERIZATION N/A 09/23/2014   Procedure: Left Heart Cath and Coronary Angiography;  Surgeon: [name], MD;  Location: [facility];  Service: Cardiovascular;  Laterality: N/A;  . CATARACT EXTRACTION    . CHOLECYSTECTOMY N/A 10/28/2016   Procedure: LAPAROSCOPIC CHOLECYSTECTOMY;  Surgeon: Ralene Ok, MD;  Location: [facility];  Service: General;  Laterality: N/A;  . CORONARY ARTERY BYPASS GRAFT N/A 09/28/2014   Procedure: CORONARY ARTERY BYPASS GRAFTING (CABG) x4 using left internal mammory artery and right saphenous leg vein harvested endoscopically.;  Surgeon: [name], MD;  Location: [facility];  Service: Open Heart Surgery;  Laterality: N/A;  . PROSTATECTOMY    . TEE WITHOUT CARDIOVERSION N/A 09/28/2014   Procedure: TRANSESOPHAGEAL ECHOCARDIOGRAM (TEE);  Surgeon: [name], MD;  Location: [facility];  Service: Open Heart Surgery;  Laterality: N/A;     Current Outpatient Medications  Medication Sig Dispense Refill  . acetaminophen (  TYLENOL) 500 MG tablet Take 1,000 mg by mouth every 6 (six) hours as needed for headache (pain).     Marland Kitchen acyclovir (ZOVIRAX) 400 MG tablet Take 400 mg by mouth 2 (two) times daily.    Marland Kitchen amLODipine (NORVASC) 10 MG tablet Take 1 tablet (10 mg total) by mouth daily. 90 tablet 3  . aspirin EC 81 MG tablet Take 1 tablet (81 mg total) by mouth daily. (Patient taking differently: Take 81 mg by mouth at bedtime. ) 90 tablet 3  . atorvastatin (LIPITOR) 40 MG tablet Take 1 tablet (40 mg total) by  mouth at bedtime. 90 tablet 2  . carvedilol (COREG) 6.25 MG tablet Take 1 tablet (6.25 mg total) by mouth 2 (two) times daily. 180 tablet 2  . esomeprazole (NEXIUM) 20 MG capsule Take 20 mg by mouth every other day. Reported on 03/01/2015    . lisinopril (ZESTRIL) 20 MG tablet Take 1 tablet (20 mg total) by mouth 2 (two) times daily. 180 tablet 1  . meloxicam (MOBIC) 15 MG tablet Take 15 mg by mouth at bedtime.     . Multiple Vitamins-Minerals (MULTIVITAMIN WITH MINERALS) tablet Take 1 tablet by mouth daily.    Marland Kitchen oxybutynin (DITROPAN) 5 MG tablet Take 5 mg by mouth at bedtime.    . prednisoLONE acetate (PRED FORTE) 1 % ophthalmic suspension Place 1 drop into the left eye daily.    . timolol (TIMOPTIC) 0.5 % ophthalmic solution Place 1 drop into the left eye 2 (two) times daily.     No current facility-administered medications for this visit.    Allergies:   Hydrochlorothiazide    Social History:  The patient  reports that he quit smoking about 39 years ago. He has never used smokeless tobacco. He reports current alcohol use. He reports that he does not use drugs.   Family History:  The patient's family history includes Ovarian cancer in his mother.    ROS:  Please see the history of present illness.   Otherwise, review of systems are positive for none.   All other systems are reviewed and negative.    PHYSICAL EXAM: VS:  BP (!) 164/98   Pulse 60   Ht 6\' 1"  (1.854 m)   Wt 164 lb 6.4 oz (74.6 kg)   BMI 21.69 kg/m  , BMI Body mass index is 21.69 kg/m. GENERAL:  Well appearing HEENT: Pupils equal round and reactive, fundi not visualized, oral mucosa unremarkable NECK:  No jugular venous distention, waveform within normal limits, carotid upstroke brisk and symmetric, no bruits LUNGS:  Clear to auscultation bilaterally HEART:  RRR.  PMI not displaced or sustained,S1 and S2 within normal limits, no S3, no S4, no clicks, no rubs, III/VI systolic murmur at the LUSB at apex ABD:  Flat,  positive bowel sounds normal in frequency in pitch, no bruits, no rebound, no guarding, no midline pulsatile mass, no hepatomegaly, no splenomegaly EXT:  2 plus pulses throughout, no edema, no cyanosis no clubbing SKIN:  No rashes no nodules NEURO:  Cranial nerves II through XII grossly intact, motor grossly intact throughout PSYCH:  Cognitively intact, oriented to person place and time   Recent Labs: No results found for requested labs within last 8760 hours.   11/25/2017: Total cholesterol 114, triglycerides 91, HDL 40, LDL 55 Potassium 4.9, creatinine 0.9 Lipid Panel No results found for: CHOL, TRIG, HDL, CHOLHDL, VLDL, LDLCALC, LDLDIRECT    Wt Readings from Last 3 Encounters:  03/23/19 164 lb 6.4 oz (  74.6 kg)  03/23/18 177 lb 12.8 oz (80.6 kg)  03/21/17 171 lb 9.6 oz (77.8 kg)    EKG 03/21/17: Sinus rhythm.  Rate 60 bpm.  Left axis deviation.  03/23/18: Sinus bradycardia.  Rate 54 bpm.  First degree AV block.  LAFB.   03/23/19: Sinus rhythm.  Rate 60 bpm.  Left axis deviation.  Prior anteroseptal infarct.  IVCD.  Echo 11/01/14: Study Conclusions  - Left ventricle: The cavity size was normal. Wall thickness was increased in a pattern of moderate LVH. Systolic function was normal. The estimated ejection fraction was in the range of 55% to 60%. Wall motion was normal; there were no regional wall motion abnormalities. - Aortic valve: Normal appearing tissue AVR with no peri valvular regurgitation. - Mitral valve: There was mild regurgitation. - Left atrium: The atrium was mildly dilated. - Atrial septum: No defect or patent foramen ovale was identified.  ASSESSMENT AND PLAN:  # Hypertensive heart disease: BP is elevated both here and at home.  Of note he was nephew was just diagnosed with pancreatic cancer and this is certainly stressing him.  However his blood pressure is elevated at home too.  We will increase amlodipine to 10 mg daily.  Continue carvedilol and  lisinopril.  # Hyperlipidemia:   Continue atorvastatin.  LDL was 50 on 11/2018.  # CAD: S/p CABG and doing well.  No angina.  Continue aspirin, carvedilol and atorvastatin.  He was encouraged to get back into his exercise routine.  # s/p AVR: [name] [name] has a bioprosthetic AVR and is doing well.  He needs antibiotic prophylaxis prior to dental procedures.  # LE Edema: Chronic.  No DVT on lower extremity Dopplers 03/2018.  Current medicines are reviewed at length with the patient today.  The patient does not have concerns regarding medicines.  The following changes have been made:  none  Labs/ tests ordered today include:   Orders Placed This Encounter  Procedures  . EKG 12-Lead     Disposition:   FU with Alexys Gassett C. Oval Linsey, MD in 3 months.  Signed, [name], MD  03/23/2019 11:54 AM    [place]

## 2019-03-24 ENCOUNTER — Telehealth: Payer: Self-pay

## 2019-03-24 ENCOUNTER — Ambulatory Visit: Payer: Self-pay | Admitting: General Surgery

## 2019-03-24 DIAGNOSIS — K4090 Unilateral inguinal hernia, without obstruction or gangrene, not specified as recurrent: Secondary | ICD-10-CM | POA: Diagnosis not present

## 2019-03-24 NOTE — H&P (View-Only) (Signed)
  History of Present Illness Ralene Ok MD; 03/24/2019 2:27 PM) The patient is a 79 year old male who presents with an inguinal hernia. Chief Complaint: Right inguinal hernia  Patient is a 79 year old male with a history of CAD, status post AVR, status post laparoscopic cholecystectomy comes in with a one-month history of a right inguinal hernia. Patient does have a history of a open left inguinal hernia repair with mesh possible 20 years ago. Patient states that recently he has noticed more discomfort pain to the right inguinal area. He states this is more so when he is lifting or been active.  His nose no signs or symptoms of incarceration or granulation.  Patient does have a history of lower midline incision secondary to prostate surgery. This was approximately 20 years ago.   Medication History (Armen Glo Herring, CMA; 03/24/2019 2:09 PM) Gas Relief Ultra Strength (Oral) Specific strength unknown - Active. Acyclovir (800MG  Tablet, Oral) Active. AmLODIPine Besylate (5MG  Tablet, Oral) Active. Atorvastatin Calcium (40MG  Tablet, Oral) Active. Carvedilol (6.25MG  Tablet, Oral) Active. Oxybutynin Chloride (5MG  Tablet, Oral) Active. PrednisoLONE Acetate (1% Suspension, Ophthalmic) Active. Timolol Maleate (0.5% Solution, Ophthalmic) Active. Aspirin EC (81MG  Tablet DR, Oral) Active. Acetaminophen (500MG  Tablet, Oral) Active. NexIUM (20MG  Capsule DR, Oral) Active. Multivitamins/Minerals (Oral) Active. Medications Reconciled    Review of Systems Ralene Ok, MD; 03/24/2019 2:29 PM) All other systems negative  Vitals (Armen Ferguson CMA; 03/24/2019 2:08 PM) 03/24/2019 2:08 PM Weight: 166.13 lb Height: 73in Body Surface Area: 1.99 m Body Mass Index: 21.92 kg/m  Temp.: 98.81F  Pulse: 71 (Regular)  P.OX: 96% (Room air) BP: 146/82 (Sitting, Left Arm, Standard)       Physical Exam Ralene Ok MD; 03/24/2019 2:27 PM) The physical exam findings are as  follows: Note:Constitutional: No acute distress, conversant, appears stated age  Eyes: Anicteric sclerae, moist conjunctiva, no lid lag  Neck: No thyromegaly, trachea midline, no cervical lymphadenopathy  Lungs: Clear to auscultation biilaterally, normal respiratory effot  Cardiovascular: regular rate & rhythm, no murmurs, no peripheal edema, pedal pulses 2+  GI: Soft, no masses or hepatosplenomegaly, non-tender to palpation, lower midline incision  MSK: Normal gait, no clubbing cyanosis, edema  Skin: No rashes, palpation reveals normal skin turgor  Psychiatric: Appropriate judgment and insight, oriented to person, place, and time  Abdomen Inspection Hernias - Right - Inguinal hernia - Reducible - Right.    Assessment & Plan Ralene Ok MD; 03/24/2019 2:28 PM) RIGHT INGUINAL HERNIA (K40.90) Impression: 79 year old male with a primary right inguinal hernia. Patient has a history of prostate surgery, AVR, hypertension. We'll obtain cardiac clearance from Dr. Oval Linsey his cardiologist. 1. The patient will like to proceed to the operating room for laparoscopic right inguinal hernia repair with mesh.  2. I discussed with the patient the signs and symptoms of incarceration and strangulation and the need to proceed to the ER should they occur.  3. I discussed with the patient the risks and benefits of the procedure to include but not limited to: Infection, bleeding, damage to surrounding structures, possible need for further surgery, possible nerve pain, and possible recurrence. The patient was understanding and wishes to proceed.  I reviewed the patient's external notes from the referring physicians as well as consulting physician team. Each of the radiologic studies and lab studies were independently reviewed and interpreted. I discussed the results of the above studies and how they relate to the patient's surgical problems.

## 2019-03-24 NOTE — H&P (Signed)
  History of Present Illness Ralene Ok MD; 03/24/2019 2:27 PM) The patient is a 79 year old male who presents with an inguinal hernia. Chief Complaint: Right inguinal hernia  Patient is a 79 year old male with a history of CAD, status post AVR, status post laparoscopic cholecystectomy comes in with a one-month history of a right inguinal hernia. Patient does have a history of a open left inguinal hernia repair with mesh possible 20 years ago. Patient states that recently he has noticed more discomfort pain to the right inguinal area. He states this is more so when he is lifting or been active.  His nose no signs or symptoms of incarceration or granulation.  Patient does have a history of lower midline incision secondary to prostate surgery. This was approximately 20 years ago.   Medication History (Armen Glo Herring, CMA; 03/24/2019 2:09 PM) Gas Relief Ultra Strength (Oral) Specific strength unknown - Active. Acyclovir (800MG  Tablet, Oral) Active. AmLODIPine Besylate (5MG  Tablet, Oral) Active. Atorvastatin Calcium (40MG  Tablet, Oral) Active. Carvedilol (6.25MG  Tablet, Oral) Active. Oxybutynin Chloride (5MG  Tablet, Oral) Active. PrednisoLONE Acetate (1% Suspension, Ophthalmic) Active. Timolol Maleate (0.5% Solution, Ophthalmic) Active. Aspirin EC (81MG  Tablet DR, Oral) Active. Acetaminophen (500MG  Tablet, Oral) Active. NexIUM (20MG  Capsule DR, Oral) Active. Multivitamins/Minerals (Oral) Active. Medications Reconciled    Review of Systems Ralene Ok, MD; 03/24/2019 2:29 PM) All other systems negative  Vitals (Armen Ferguson CMA; 03/24/2019 2:08 PM) 03/24/2019 2:08 PM Weight: 166.13 lb Height: 73in Body Surface Area: 1.99 m Body Mass Index: 21.92 kg/m  Temp.: 98.73F  Pulse: 71 (Regular)  P.OX: 96% (Room air) BP: 146/82 (Sitting, Left Arm, Standard)       Physical Exam Ralene Ok MD; 03/24/2019 2:27 PM) The physical exam findings are as  follows: Note:Constitutional: No acute distress, conversant, appears stated age  Eyes: Anicteric sclerae, moist conjunctiva, no lid lag  Neck: No thyromegaly, trachea midline, no cervical lymphadenopathy  Lungs: Clear to auscultation biilaterally, normal respiratory effot  Cardiovascular: regular rate & rhythm, no murmurs, no peripheal edema, pedal pulses 2+  GI: Soft, no masses or hepatosplenomegaly, non-tender to palpation, lower midline incision  MSK: Normal gait, no clubbing cyanosis, edema  Skin: No rashes, palpation reveals normal skin turgor  Psychiatric: Appropriate judgment and insight, oriented to person, place, and time  Abdomen Inspection Hernias - Right - Inguinal hernia - Reducible - Right.    Assessment & Plan Ralene Ok MD; 03/24/2019 2:28 PM) RIGHT INGUINAL HERNIA (K40.90) Impression: 79 year old male with a primary right inguinal hernia. Patient has a history of prostate surgery, AVR, hypertension. We'll obtain cardiac clearance from Dr. Oval Linsey his cardiologist. 1. The patient will like to proceed to the operating room for laparoscopic right inguinal hernia repair with mesh.  2. I discussed with the patient the signs and symptoms of incarceration and strangulation and the need to proceed to the ER should they occur.  3. I discussed with the patient the risks and benefits of the procedure to include but not limited to: Infection, bleeding, damage to surrounding structures, possible need for further surgery, possible nerve pain, and possible recurrence. The patient was understanding and wishes to proceed.  I reviewed the patient's external notes from the referring physicians as well as consulting physician team. Each of the radiologic studies and lab studies were independently reviewed and interpreted. I discussed the results of the above studies and how they relate to the patient's surgical problems.

## 2019-03-24 NOTE — Telephone Encounter (Signed)
   [facility] Pre-operative Risk Assessment    Request for surgical clearance:  1. What type of surgery is being performed? Laparoscopic right inguinal hernia repair  2. When is this surgery scheduled? TBD  3. What type of clearance is required (medical clearance vs. Pharmacy clearance to hold med vs. Both)? Both  4. Are there any medications that need to be held prior to surgery and how long? Aspirin  5. Practice name and name of physician performing surgery?  [facility]    6. What is your office phone number [phone]   7.   What is your office fax number [phone]  8.   Anesthesia type General    Kathyrn Lass 03/24/2019, 4:40 PM  _________________________________________________________________   (provider comments below)

## 2019-03-25 NOTE — Telephone Encounter (Signed)
Dr. Oval Linsey, please review. Based on your office note 2 days ago, patient was doing very well. Amlodipine increased due to elevated BP. Would you be ok with the patient proceed with surgery after holding aspirin for 5 days?  Please send your response to [facility] PREOP

## 2019-03-26 NOTE — Telephone Encounter (Signed)
Yes, he is at acceptable risk for hernia surgery.  OK hold aspirin for 5 days.

## 2019-03-30 NOTE — Telephone Encounter (Signed)
   Primary Cardiologist: [name], MD  Chart reviewed as part of pre-operative protocol coverage. He has a history of CABG and bioprosthetic aortic valve replacement in 2016. He was recently seen by Dr. Oval Linsey on 03/23/2019 at which time he was doing very well except for elevated BP. His amlodipine was increased.   Based on ACC/AHA guidelines, [name] [name] would be at acceptable risk for the planned procedure without further cardiovascular testing.   Aspirin can be held for 5 days. Resume as soon as felt safe by the surgeon.   I will route this recommendation to the requesting party via Epic fax function and remove from pre-op pool.  Please call with questions.  Daune Perch, NP 03/30/2019, 10:59 AM

## 2019-03-31 DIAGNOSIS — Z947 Corneal transplant status: Secondary | ICD-10-CM | POA: Diagnosis not present

## 2019-03-31 DIAGNOSIS — H4052X3 Glaucoma secondary to other eye disorders, left eye, severe stage: Secondary | ICD-10-CM | POA: Diagnosis not present

## 2019-03-31 DIAGNOSIS — H4052X4 Glaucoma secondary to other eye disorders, left eye, indeterminate stage: Secondary | ICD-10-CM | POA: Diagnosis not present

## 2019-03-31 DIAGNOSIS — H16122 Filamentary keratitis, left eye: Secondary | ICD-10-CM | POA: Diagnosis not present

## 2019-03-31 DIAGNOSIS — H5712 Ocular pain, left eye: Secondary | ICD-10-CM | POA: Diagnosis not present

## 2019-03-31 DIAGNOSIS — Z961 Presence of intraocular lens: Secondary | ICD-10-CM | POA: Diagnosis not present

## 2019-04-01 DIAGNOSIS — M1712 Unilateral primary osteoarthritis, left knee: Secondary | ICD-10-CM | POA: Diagnosis not present

## 2019-04-06 NOTE — Pre-Procedure Instructions (Signed)
[name] [name]  04/06/2019    Your procedure is scheduled on Monday, March 22..  Report to [facility], Main Entrance or Entrance "A" at 1:45 P.M.                Your surgery or procedure is scheduled for 3:45 P.M.   Call this number if you have problems the morning of surgery: [phone]  This is the number for the Pre- Surgical Desk.                  For any other questions, please call [phone], Monday - Friday 8 AM - 4 PM.   Remember:  Do not eat or drink after midnight Sunday, March 21    Take these medicines the morning of surgery with A SIP OF WATER :  amLODipine (NORVASC)              carvedilol (COREG)              May use Eye Drops              If Needed may take: acetaminophen (TYLENOL)  Follow Dr. Johney Frame instructions regarding Aspirin.  STOP taking  Aspirin Products (Goody Powder, Excedrin Migraine), Ibuprofen (Advil), Naproxen (Aleve) ,meloxicam (MOBIC),  Vitamins and Herbal Products (ie Fish Oil).  Special instructions:   [facility]- Preparing For Surgery  Before surgery, you can play an important role. Because skin is not sterile, your skin needs to be as free of germs as possible. You can reduce the number of germs on your skin by washing with CHG (chlorahexidine gluconate) Soap before surgery.  CHG is an antiseptic cleaner which kills germs and bonds with the skin to continue killing germs even after washing.    Oral Hygiene is also important to reduce your risk of infection.  Remember - BRUSH YOUR TEETH THE MORNING OF SURGERY WITH YOUR REGULAR TOOTHPASTE  Please do not use if you have an allergy to CHG or antibacterial soaps. If your skin becomes reddened/irritated stop using the CHG.  Do not shave (including legs and underarms) for at least 48 hours prior to first CHG shower. It is OK to shave your face.  Please follow these instructions carefully.   1. Shower the NIGHT BEFORE SURGERY and the MORNING OF SURGERY with CHG.   2. If you  chose to wash your hair, wash your hair first as usual with your normal shampoo.  3. After you shampoo,wash your face and private area with the soap you use at home, then rinse your hair and body thoroughly to remove the shampoo and soap.  4. Use CHG as you would any other liquid soap. You can apply CHG directly to the skin and wash gently with a scrungie or a clean washcloth.   5. Apply the CHG Soap to your body ONLY FROM THE NECK DOWN.  Do not use on open wounds or open sores. Avoid contact with your eyes, ears, mouth and genitals (private parts).   6. Wash thoroughly, paying special attention to the area where your surgery will be performed.  7. Thoroughly rinse your body with warm water from the neck down.  8. DO NOT shower/wash with your normal soap after using and rinsing off the CHG Soap.  9. Pat yourself dry with a CLEAN TOWEL.  10. Wear CLEAN PAJAMAS to bed the night before surgery, wear comfortable clothes the morning of surgery  11. Place CLEAN SHEETS on your bed the  night of your first shower and DO NOT SLEEP WITH PETS.    Day of Surgery:  Do not apply any deodorants/lotions, powders or colognes.  Please wear clean clothes to the hospital/surgery center.   Remember to brush your teeth WITH YOUR REGULAR TOOTHPASTE.  Do not wear jewelry, make-up or nail polish.  . Men may shave face and neck.  Do not bring valuables to the hospital.  [facility] is not responsible for any belongings or valuables.  Contacts, dentures or bridgework may not be worn into surgery.  Leave your suitcase in the car.  After surgery it may be brought to your room.  For patients admitted to the hospital, discharge time will be determined by your treatment team.  Patients discharged the day of surgery will not be allowed to drive home.   Please read over the following fact sheets that you were given.

## 2019-04-07 ENCOUNTER — Other Ambulatory Visit: Payer: Self-pay

## 2019-04-07 ENCOUNTER — Encounter (HOSPITAL_COMMUNITY): Payer: Self-pay

## 2019-04-07 ENCOUNTER — Encounter (HOSPITAL_COMMUNITY)
Admission: RE | Admit: 2019-04-07 | Discharge: 2019-04-07 | Disposition: A | Discharge status: Home / Self Care | Payer: Medicare HMO | Source: Ambulatory Visit | Attending: General Surgery | Admitting: General Surgery

## 2019-04-07 DIAGNOSIS — I2510 Atherosclerotic heart disease of native coronary artery without angina pectoris: Secondary | ICD-10-CM | POA: Diagnosis not present

## 2019-04-07 DIAGNOSIS — Z951 Presence of aortocoronary bypass graft: Secondary | ICD-10-CM | POA: Insufficient documentation

## 2019-04-07 DIAGNOSIS — Z01818 Encounter for other preprocedural examination: Secondary | ICD-10-CM | POA: Diagnosis not present

## 2019-04-07 DIAGNOSIS — Z7982 Long term (current) use of aspirin: Secondary | ICD-10-CM | POA: Diagnosis not present

## 2019-04-07 DIAGNOSIS — Z953 Presence of xenogenic heart valve: Secondary | ICD-10-CM | POA: Diagnosis not present

## 2019-04-07 HISTORY — DX: Unspecified osteoarthritis, unspecified site: M19.90

## 2019-04-07 HISTORY — DX: Presence of dental prosthetic device (complete) (partial): Z97.2

## 2019-04-07 HISTORY — DX: Chronic kidney disease, unspecified: N18.9

## 2019-04-07 HISTORY — DX: Personal history of urinary calculi: Z87.442

## 2019-04-07 HISTORY — DX: Unspecified hearing loss, unspecified ear: H91.90

## 2019-04-07 LAB — BASIC METABOLIC PANEL
Anion gap: 9 (ref 5–15)
BUN: 31 mg/dL — ABNORMAL HIGH (ref 8–23)
CO2: 24 mmol/L (ref 22–32)
Calcium: 9.4 mg/dL (ref 8.9–10.3)
Chloride: 105 mmol/L (ref 98–111)
Creatinine, Ser: 0.95 mg/dL (ref 0.61–1.24)
GFR calc Af Amer: >60 mL/min (ref >60)
GFR calc non Af Amer: >60 mL/min (ref >60)
Glucose, Bld: 104 mg/dL — ABNORMAL HIGH (ref 70–99)
Potassium: 4.5 mmol/L (ref 3.5–5.1)
Sodium: 138 mmol/L (ref 135–145)

## 2019-04-07 LAB — CBC
HCT: 46 % (ref 39.0–52.0)
Hemoglobin: 15.5 g/dL (ref 13.0–17.0)
MCH: 33.5 pg (ref 26.0–34.0)
MCHC: 33.7 g/dL (ref 30.0–36.0)
MCV: 99.4 fL (ref 80.0–100.0)
Platelets: 195 10*3/uL (ref 150–400)
RBC: 4.63 MIL/uL (ref 4.22–5.81)
RDW: 12.1 % (ref 11.5–15.5)
WBC: 6.3 10*3/uL (ref 4.0–10.5)
nRBC: 0 % (ref 0.0–0.2)

## 2019-04-07 NOTE — Progress Notes (Signed)
Patient denies shortness of breath, fever, cough and chest pain.  PCP - Dr Serita Grammes Cardiologist - Dr [name]  Chest x-ray - denies EKG - 03/23/19 Stress Test - denies ECHO - 11/01/14 Cardiac Cath - 09/23/14  Anesthesia review: Yes  Coronavirus Screening Covid test scheduled on Thursday, 04/08/19. Have you experienced the following symptoms:  Cough yes/no: No Fever (>100.25F)  yes/no: No Runny nose yes/no: No Sore throat yes/no: No Difficulty breathing/shortness of breath  yes/no: No  Have you traveled in the last 14 days and where? yes/no: No  Patient verbalized understanding of instructions that were given to them at the PAT appointment.

## 2019-04-07 NOTE — Pre-Procedure Instructions (Signed)
[name] [name]  04/07/2019    Your procedure is scheduled on Monday, March 22..  Report to [facility], Main Entrance or Entrance "A" at 1:45 P.M.                Your surgery or procedure is scheduled for 3:45 P.M.   Call this number if you have problems the morning of surgery: [phone]  This is the number for the Pre- Surgical Desk.                  For any other questions, please call [phone], Monday - Friday 8 AM - 4 PM.   Remember:  Do not eat or drink after midnight Sunday, March 21    Take these medicines the morning of surgery with A SIP OF WATER :  amLODipine (NORVASC)              carvedilol (COREG)              May use Eye Drops  acyclovir (ZOVIRAX)              esomeprazole (NEXIUM)  If Needed may take: acetaminophen (TYLENOL)  Follow Dr. Johney Frame instructions regarding Aspirin.  STOP taking  Aspirin Products (Goody Powder, Excedrin Migraine), Ibuprofen (Advil), Naproxen (Aleve) ,meloxicam (MOBIC),  Vitamins and Herbal Products (ie Fish Oil).  Special instructions:   [place]- Preparing For Surgery  Before surgery, you can play an important role. Because skin is not sterile, your skin needs to be as free of germs as possible. You can reduce the number of germs on your skin by washing with CHG (chlorahexidine gluconate) Soap before surgery.  CHG is an antiseptic cleaner which kills germs and bonds with the skin to continue killing germs even after washing.    Oral Hygiene is also important to reduce your risk of infection.  Remember - BRUSH YOUR TEETH THE MORNING OF SURGERY WITH YOUR REGULAR TOOTHPASTE  Please do not use if you have an allergy to CHG or antibacterial soaps. If your skin becomes reddened/irritated stop using the CHG.  Do not shave (including legs and underarms) for at least 48 hours prior to first CHG shower. It is OK to shave your face.  Please follow these instructions carefully.   1. Shower the NIGHT BEFORE SURGERY Sun and  the [facility] OF SURGERY Mon with CHG.   2. If you chose to wash your hair, wash your hair first as usual with your normal shampoo.  3. After you shampoo,wash your face and private area with the soap you use at home, then rinse your hair and body thoroughly to remove the shampoo and soap.  4. Use CHG as you would any other liquid soap. You can apply CHG directly to the skin and wash gently with a scrungie or a clean washcloth.   5. Apply the CHG Soap to your body ONLY FROM THE NECK DOWN.  Do not use on open wounds or open sores. Avoid contact with your eyes, ears, mouth and genitals (private parts).   6. Wash thoroughly, paying special attention to the area where your surgery will be performed.  7. Thoroughly rinse your body with warm water from the neck down.  8. DO NOT shower/wash with your normal soap after using and rinsing off the CHG Soap.  9. Pat yourself dry with a CLEAN TOWEL.  10. Wear CLEAN PAJAMAS to bed the night before surgery, wear comfortable clothes the morning of surgery  11. Place  CLEAN SHEETS on your bed the night of your first shower and DO NOT SLEEP WITH PETS.    Day of Surgery:  Do not apply any deodorants/lotions, powders or colognes.  Please wear clean clothes to the hospital/surgery center.   Remember to brush your teeth WITH YOUR REGULAR TOOTHPASTE.   Do not wear jewelry.  Men may shave face and neck.  Do not bring valuables to the hospital.  [facility] is not responsible for any belongings or valuables.  Contacts, dentures or bridgework may not be worn into surgery.  Leave your suitcase in the car.  After surgery it may be brought to your room.  For patients admitted to the hospital, discharge time will be determined by your treatment team.  Patients discharged the day of surgery will not be allowed to drive home.   Please read over the following fact sheets that you were given.

## 2019-04-08 ENCOUNTER — Other Ambulatory Visit (HOSPITAL_COMMUNITY)
Admission: RE | Admit: 2019-04-08 | Discharge: 2019-04-08 | Disposition: A | Discharge status: Home / Self Care | Payer: Medicare HMO | Source: Ambulatory Visit | Attending: General Surgery | Admitting: General Surgery

## 2019-04-08 DIAGNOSIS — Z01812 Encounter for preprocedural laboratory examination: Secondary | ICD-10-CM | POA: Insufficient documentation

## 2019-04-08 DIAGNOSIS — Z20822 Contact with and (suspected) exposure to covid-19: Secondary | ICD-10-CM | POA: Insufficient documentation

## 2019-04-08 LAB — SARS CORONAVIRUS 2 (TAT 6-24 HRS): SARS Coronavirus 2: NEGATIVE

## 2019-04-08 NOTE — Progress Notes (Signed)
Anesthesia Chart Review:  Follows with cardilogy for hx of CAD s/p CABG 2016 with concomitant bioprosthetic AVR. Cardiac clearance per telephone encounter 03/30/19, "Chart reviewed as part of pre-operative protocol coverage. He has a history of CABG and bioprosthetic aortic valve replacement in 2016. He was recently seen by Dr. Oval Linsey on 03/23/2019 at which time he was doing very well except for elevated BP. His amlodipine was increased. Based on ACC/AHA guidelines, [name] [name] would be at acceptable risk for the planned procedure without further cardiovascular testing. Aspirin can be held for 5 days. Resume as soon as felt safe by the surgeon."  Preop labs reviewed. BUN mildly elevated 31 otherwise unremarkable.  EKG 03/23/19:  NSR. Rate 60. LAD. Anteroseptal infarct, age undetermined.   TTE 11/01/14: - Left ventricle: The cavity size was normal. Wall thickness was  increased in a pattern of moderate LVH. Systolic function was  normal. The estimated ejection fraction was in the range of 55%  to 60%. Wall motion was normal; there were no regional wall  motion abnormalities.  - Aortic valve: Normal appearing tissue AVR with no peri valvular  regurgitation.  - Mitral valve: There was mild regurgitation.  - Left atrium: The atrium was mildly dilated.  - Atrial septum: No defect or patent foramen ovale was identified.  Wynonia Musty [facility] Short Stay Center/Anesthesiology Phone [phone] 04/08/2019 1:41 PM

## 2019-04-08 NOTE — Anesthesia Preprocedure Evaluation (Addendum)
Anesthesia Evaluation  Patient identified by MRN, date of birth, ID band Patient awake    Reviewed: Allergy & Precautions, NPO status , Patient's Chart, lab work & pertinent test results  Airway Mallampati: II  TM Distance: >3 FB Neck ROM: Full    Dental  (+) Teeth Intact, Dental Advisory Given   Pulmonary former smoker,    breath sounds clear to auscultation       Cardiovascular hypertension,  Rhythm:Regular Rate:Normal     Neuro/Psych    GI/Hepatic   Endo/Other    Renal/GU      Musculoskeletal   Abdominal   Peds  Hematology   Anesthesia Other Findings   Reproductive/Obstetrics                            Anesthesia Physical Anesthesia Plan  ASA: III  Anesthesia Plan: General   Post-op Pain Management:    Induction: Intravenous  PONV Risk Score and Plan: Ondansetron  Airway Management Planned: Oral ETT  Additional Equipment:   Intra-op Plan:   Post-operative Plan: Extubation in OR  Informed Consent: I have reviewed the patients History and Physical, chart, labs and discussed the procedure including the risks, benefits and alternatives for the proposed anesthesia with the patient or authorized representative who has indicated his/her understanding and acceptance.     Dental advisory given  Plan Discussed with: CRNA and Anesthesiologist  Anesthesia Plan Comments: (Follows with cardilogy for hx of CAD s/p CABG 2016 with concomitant bioprosthetic AVR. Cardiac clearance per telephone encounter 03/30/19, "Chart reviewed as part of pre-operative protocol coverage. He has a history of CABG and bioprosthetic aortic valve replacement in 2016. He was recently seen by Dr. Oval Linsey on 03/23/2019 at which time he was doing very well except for elevated BP. His amlodipine was increased. Based on ACC/AHA guidelines, VERLON KILBOURN would be at acceptable risk for the planned procedure without  further cardiovascular testing. Aspirin can be held for 5 days. Resume as soon as felt safe by the surgeon."  Preop labs reviewed. BUN mildly elevated 31 otherwise unremarkable.  EKG 03/23/19:  NSR. Rate 60. LAD. Anteroseptal infarct, age undetermined.   TTE 11/01/14: - Left ventricle: The cavity size was normal. Wall thickness was  increased in a pattern of moderate LVH. Systolic function was  normal. The estimated ejection fraction was in the range of 55%  to 60%. Wall motion was normal; there were no regional wall  motion abnormalities.  - Aortic valve: Normal appearing tissue AVR with no peri valvular  regurgitation.  - Mitral valve: There was mild regurgitation.  - Left atrium: The atrium was mildly dilated.  - Atrial septum: No defect or patent foramen ovale was identified. )       Anesthesia Quick Evaluation

## 2019-04-12 ENCOUNTER — Encounter (HOSPITAL_COMMUNITY)
Admission: RE | Disposition: A | Discharge status: Home / Self Care | Payer: Self-pay | Source: Home / Self Care | Attending: General Surgery

## 2019-04-12 ENCOUNTER — Ambulatory Visit (HOSPITAL_COMMUNITY)
Admission: RE | Admit: 2019-04-12 | Discharge: 2019-04-12 | Disposition: A | Discharge status: Home / Self Care | Payer: Medicare HMO | Attending: General Surgery | Admitting: General Surgery

## 2019-04-12 ENCOUNTER — Ambulatory Visit (HOSPITAL_COMMUNITY): Payer: Medicare HMO | Admitting: Physician Assistant

## 2019-04-12 ENCOUNTER — Encounter (HOSPITAL_COMMUNITY): Payer: Self-pay | Admitting: General Surgery

## 2019-04-12 ENCOUNTER — Other Ambulatory Visit: Payer: Self-pay

## 2019-04-12 ENCOUNTER — Ambulatory Visit (HOSPITAL_COMMUNITY): Payer: Medicare HMO | Admitting: Anesthesiology

## 2019-04-12 DIAGNOSIS — Z87891 Personal history of nicotine dependence: Secondary | ICD-10-CM | POA: Diagnosis not present

## 2019-04-12 DIAGNOSIS — I2510 Atherosclerotic heart disease of native coronary artery without angina pectoris: Secondary | ICD-10-CM | POA: Insufficient documentation

## 2019-04-12 DIAGNOSIS — I10 Essential (primary) hypertension: Secondary | ICD-10-CM | POA: Insufficient documentation

## 2019-04-12 DIAGNOSIS — E7800 Pure hypercholesterolemia, unspecified: Secondary | ICD-10-CM | POA: Diagnosis not present

## 2019-04-12 DIAGNOSIS — K4090 Unilateral inguinal hernia, without obstruction or gangrene, not specified as recurrent: Secondary | ICD-10-CM | POA: Insufficient documentation

## 2019-04-12 DIAGNOSIS — Z9049 Acquired absence of other specified parts of digestive tract: Secondary | ICD-10-CM | POA: Insufficient documentation

## 2019-04-12 DIAGNOSIS — Z79899 Other long term (current) drug therapy: Secondary | ICD-10-CM | POA: Diagnosis not present

## 2019-04-12 DIAGNOSIS — I25110 Atherosclerotic heart disease of native coronary artery with unstable angina pectoris: Secondary | ICD-10-CM | POA: Diagnosis not present

## 2019-04-12 HISTORY — PX: INGUINAL HERNIA REPAIR: SHX194

## 2019-04-12 SURGERY — REPAIR, HERNIA, INGUINAL, LAPAROSCOPIC
Anesthesia: General | Laterality: Right

## 2019-04-12 MED ORDER — FENTANYL CITRATE (PF) 250 MCG/5ML IJ SOLN
INTRAMUSCULAR | Status: AC
  Filled 2019-04-12: qty 5

## 2019-04-12 MED ORDER — PHENYLEPHRINE HCL-NACL 10-0.9 MG/250ML-% IV SOLN
INTRAVENOUS | Status: DC | PRN
  Administered 2019-04-12: 20 ug/min via INTRAVENOUS

## 2019-04-12 MED ORDER — ROCURONIUM BROMIDE 100 MG/10ML IV SOLN
INTRAVENOUS | Status: DC | PRN
  Administered 2019-04-12: 60 mg via INTRAVENOUS

## 2019-04-12 MED ORDER — GABAPENTIN 100 MG PO CAPS
ORAL_CAPSULE | ORAL | Status: AC
  Filled 2019-04-12: qty 2

## 2019-04-12 MED ORDER — LIDOCAINE 2% (20 MG/ML) 5 ML SYRINGE
INTRAMUSCULAR | Status: AC
  Filled 2019-04-12: qty 5

## 2019-04-12 MED ORDER — ONDANSETRON HCL 4 MG/2ML IJ SOLN: 4.0000 mg | Freq: Once | INTRAMUSCULAR | Status: DC | PRN

## 2019-04-12 MED ORDER — ACETAMINOPHEN 500 MG PO TABS
ORAL_TABLET | ORAL | Status: AC
  Filled 2019-04-12: qty 2

## 2019-04-12 MED ORDER — FENTANYL CITRATE (PF) 100 MCG/2ML IJ SOLN
INTRAMUSCULAR | Status: AC
  Filled 2019-04-12: qty 2

## 2019-04-12 MED ORDER — FENTANYL CITRATE (PF) 100 MCG/2ML IJ SOLN
25.0000 ug | INTRAMUSCULAR | Status: DC | PRN
  Administered 2019-04-12 (×4): 25 ug via INTRAVENOUS

## 2019-04-12 MED ORDER — CHLORHEXIDINE GLUCONATE CLOTH 2 % EX PADS: 6.0000 | MEDICATED_PAD | Freq: Once | CUTANEOUS | Status: DC

## 2019-04-12 MED ORDER — GLYCOPYRROLATE PF 0.2 MG/ML IJ SOSY
PREFILLED_SYRINGE | INTRAMUSCULAR | Status: DC | PRN
  Administered 2019-04-12 (×2): .1 mg via INTRAVENOUS

## 2019-04-12 MED ORDER — ONDANSETRON HCL 4 MG/2ML IJ SOLN
INTRAMUSCULAR | Status: DC | PRN
  Administered 2019-04-12: 4 mg via INTRAVENOUS

## 2019-04-12 MED ORDER — CEFAZOLIN SODIUM-DEXTROSE 2-4 GM/100ML-% IV SOLN
INTRAVENOUS | Status: AC
  Filled 2019-04-12: qty 100

## 2019-04-12 MED ORDER — PROPOFOL 10 MG/ML IV BOLUS
INTRAVENOUS | Status: AC
  Filled 2019-04-12: qty 20

## 2019-04-12 MED ORDER — DEXAMETHASONE SODIUM PHOSPHATE 10 MG/ML IJ SOLN
INTRAMUSCULAR | Status: DC | PRN
  Administered 2019-04-12: 4 mg via INTRAVENOUS

## 2019-04-12 MED ORDER — PROPOFOL 10 MG/ML IV BOLUS
INTRAVENOUS | Status: DC | PRN
  Administered 2019-04-12: 160 mg via INTRAVENOUS

## 2019-04-12 MED ORDER — CEFAZOLIN SODIUM-DEXTROSE 2-4 GM/100ML-% IV SOLN
2.0000 g | INTRAVENOUS | Status: AC
  Administered 2019-04-12: 2 g via INTRAVENOUS

## 2019-04-12 MED ORDER — GABAPENTIN 100 MG PO CAPS
200.0000 mg | ORAL_CAPSULE | ORAL | Status: AC
  Administered 2019-04-12: 200 mg via ORAL

## 2019-04-12 MED ORDER — ACETAMINOPHEN 500 MG PO TABS
1000.0000 mg | ORAL_TABLET | ORAL | Status: AC
  Administered 2019-04-12: 1000 mg via ORAL

## 2019-04-12 MED ORDER — PHENYLEPHRINE 40 MCG/ML (10ML) SYRINGE FOR IV PUSH (FOR BLOOD PRESSURE SUPPORT)
PREFILLED_SYRINGE | INTRAVENOUS | Status: DC | PRN
  Administered 2019-04-12: 120 ug via INTRAVENOUS
  Administered 2019-04-12: 80 ug via INTRAVENOUS

## 2019-04-12 MED ORDER — BUPIVACAINE HCL (PF) 0.25 % IJ SOLN
INTRAMUSCULAR | Status: AC
  Filled 2019-04-12: qty 30

## 2019-04-12 MED ORDER — OXYCODONE HCL 5 MG/5ML PO SOLN: 5.0000 mg | Freq: Once | ORAL | Status: DC | PRN

## 2019-04-12 MED ORDER — FENTANYL CITRATE (PF) 100 MCG/2ML IJ SOLN
INTRAMUSCULAR | Status: DC | PRN
  Administered 2019-04-12: 50 ug via INTRAVENOUS
  Administered 2019-04-12: 100 ug via INTRAVENOUS

## 2019-04-12 MED ORDER — STERILE WATER FOR IRRIGATION IR SOLN
Status: DC | PRN
  Administered 2019-04-12: 1000 mL

## 2019-04-12 MED ORDER — ONDANSETRON HCL 4 MG/2ML IJ SOLN
INTRAMUSCULAR | Status: AC
  Filled 2019-04-12: qty 2

## 2019-04-12 MED ORDER — SUGAMMADEX SODIUM 200 MG/2ML IV SOLN
INTRAVENOUS | Status: DC | PRN
  Administered 2019-04-12: 200 mg via INTRAVENOUS

## 2019-04-12 MED ORDER — TRAMADOL HCL 50 MG PO TABS: 50.0000 mg | ORAL_TABLET | Freq: Four times a day (QID) | ORAL | 0 refills | Status: DC | PRN

## 2019-04-12 MED ORDER — 0.9 % SODIUM CHLORIDE (POUR BTL) OPTIME
TOPICAL | Status: DC | PRN
  Administered 2019-04-12: 12:00:00 1000 mL

## 2019-04-12 MED ORDER — LIDOCAINE HCL (CARDIAC) PF 100 MG/5ML IV SOSY
PREFILLED_SYRINGE | INTRAVENOUS | Status: DC | PRN
  Administered 2019-04-12: 40 mg via INTRAVENOUS

## 2019-04-12 MED ORDER — LACTATED RINGERS IV SOLN: INTRAVENOUS | Status: DC

## 2019-04-12 MED ORDER — OXYCODONE HCL 5 MG PO TABS: 5.0000 mg | ORAL_TABLET | Freq: Once | ORAL | Status: DC | PRN

## 2019-04-12 MED ORDER — BUPIVACAINE HCL 0.25 % IJ SOLN
INTRAMUSCULAR | Status: DC | PRN
  Administered 2019-04-12: 30 mL

## 2019-04-12 SURGICAL SUPPLY — 40 items
CANISTER SUCT 3000ML PPV (MISCELLANEOUS) IMPLANT
COVER SURGICAL LIGHT HANDLE (MISCELLANEOUS) ×3 IMPLANT
COVER WAND RF STERILE (DRAPES) IMPLANT
DEFOGGER SCOPE WARMER CLEARIFY (MISCELLANEOUS) IMPLANT
DERMABOND ADVANCED (GAUZE/BANDAGES/DRESSINGS) ×2
DERMABOND ADVANCED .7 DNX12 (GAUZE/BANDAGES/DRESSINGS) ×1 IMPLANT
DISSECTOR BLUNT TIP ENDO 5MM (MISCELLANEOUS) IMPLANT
ELECT REM PT RETURN 9FT ADLT (ELECTROSURGICAL) ×3
ELECTRODE REM PT RTRN 9FT ADLT (ELECTROSURGICAL) ×1 IMPLANT
ENDOLOOP SUT PDS II  0 18 (SUTURE) ×3
ENDOLOOP SUT PDS II 0 18 (SUTURE) ×1 IMPLANT
GLOVE BIO SURGEON STRL SZ7.5 (GLOVE) ×3 IMPLANT
GOWN STRL REUS W/ TWL LRG LVL3 (GOWN DISPOSABLE) ×3 IMPLANT
GOWN STRL REUS W/ TWL XL LVL3 (GOWN DISPOSABLE) ×1 IMPLANT
GOWN STRL REUS W/TWL LRG LVL3 (GOWN DISPOSABLE) ×9
GOWN STRL REUS W/TWL XL LVL3 (GOWN DISPOSABLE) ×3
KIT BASIN OR (CUSTOM PROCEDURE TRAY) ×3 IMPLANT
KIT TURNOVER KIT B (KITS) ×3 IMPLANT
MESH 3DMAX 5X7 RT XLRG (Mesh General) ×3 IMPLANT
NEEDLE INSUFFLATION 14GA 120MM (NEEDLE) IMPLANT
NS IRRIG 1000ML POUR BTL (IV SOLUTION) ×3 IMPLANT
PAD ARMBOARD 7.5X6 YLW CONV (MISCELLANEOUS) ×6 IMPLANT
RELOAD STAPLE HERNIA 4.0 BLUE (INSTRUMENTS) ×3 IMPLANT
RELOAD STAPLE HERNIA 4.8 BLK (STAPLE) IMPLANT
SCISSORS LAP 5X35 DISP (ENDOMECHANICALS) ×3 IMPLANT
SET IRRIG TUBING LAPAROSCOPIC (IRRIGATION / IRRIGATOR) ×3 IMPLANT
SET TUBE SMOKE EVAC HIGH FLOW (TUBING) ×3 IMPLANT
STAPLER HERNIA 12 8.5 360D (INSTRUMENTS) ×6 IMPLANT
SUT MNCRL AB 4-0 PS2 18 (SUTURE) ×3 IMPLANT
SUT VIC AB 1 CT1 27 (SUTURE)
SUT VIC AB 1 CT1 27XBRD ANBCTR (SUTURE) IMPLANT
SYRINGE TOOMEY DISP (SYRINGE) ×3 IMPLANT
TOWEL GREEN STERILE (TOWEL DISPOSABLE) ×3 IMPLANT
TOWEL GREEN STERILE FF (TOWEL DISPOSABLE) ×3 IMPLANT
TRAY FOLEY W/BAG SLVR 14FR (SET/KITS/TRAYS/PACK) ×3 IMPLANT
TRAY LAPAROSCOPIC MC (CUSTOM PROCEDURE TRAY) ×3 IMPLANT
TROCAR OPTICAL SHORT 5MM (TROCAR) ×3 IMPLANT
TROCAR OPTICAL SLV SHORT 5MM (TROCAR) ×3 IMPLANT
TROCAR XCEL 12X100 BLDLESS (ENDOMECHANICALS) ×3 IMPLANT
WATER STERILE IRR 1000ML POUR (IV SOLUTION) ×3 IMPLANT

## 2019-04-12 NOTE — Anesthesia Procedure Notes (Signed)
Procedure Name: Intubation Date/Time: 04/12/2019 11:33 AM Performed by: Sneha Willig T, CRNA Pre-anesthesia Checklist: Patient identified, Suction available, Patient being monitored, Timeout performed and Emergency Drugs available Patient Re-evaluated:Patient Re-evaluated prior to induction Oxygen Delivery Method: Circle system utilized Preoxygenation: Pre-oxygenation with 100% oxygen Induction Type: IV induction Ventilation: Mask ventilation without difficulty Laryngoscope Size: Mac and 3 Grade View: Grade I Tube type: Oral Tube size: 7.5 mm Number of attempts: 1 Airway Equipment and Method: Stylet Placement Confirmation: ETT inserted through vocal cords under direct vision,  positive ETCO2,  CO2 detector and breath sounds checked- equal and bilateral Secured at: 24 cm Tube secured with: Tape Dental Injury: Teeth and Oropharynx as per pre-operative assessment

## 2019-04-12 NOTE — Anesthesia Postprocedure Evaluation (Signed)
Anesthesia Post Note  Patient: [name] [name]  Procedure(s) Performed: LAPAROSCOPIC RIGHT INGUINAL HERNIA REPAIR WITH MESH (Right )     Patient location during evaluation: PACU Anesthesia Type: General Level of consciousness: awake and alert Pain management: pain level controlled Vital Signs Assessment: post-procedure vital signs reviewed and stable Respiratory status: spontaneous breathing, nonlabored ventilation, respiratory function stable and patient connected to nasal cannula oxygen Cardiovascular status: blood pressure returned to baseline and stable Postop Assessment: no apparent nausea or vomiting Anesthetic complications: no    Last Vitals:  Vitals:   04/12/19 1356 04/12/19 1401  BP:  133/78  Pulse: 61 (!) 58  Resp: 14 17  Temp:  36.4 C  SpO2: 100% 99%    Last Pain:  Vitals:   04/12/19 1401  TempSrc:   PainSc: 2                  Nilam Quakenbush COKER

## 2019-04-12 NOTE — H&P (Unsigned)
  History of Present Illness Ralene Ok MD; 03/24/2019 2:27 PM) The patient is a 79 year old male who presents with an inguinal hernia. Chief Complaint: Right inguinal hernia  Patient is a 79 year old male with a history of CAD, status post AVR, status post laparoscopic cholecystectomy comes in with a one-month history of a right inguinal hernia. Patient does have a history of a open left inguinal hernia repair with mesh possible 20 years ago. Patient states that recently he has noticed more discomfort pain to the right inguinal area. He states this is more so when he is lifting or been active.  His nose no signs or symptoms of incarceration or granulation.  Patient does have a history of lower midline incision secondary to prostate surgery. This was approximately 20 years ago.   Medication History (Armen Glo Herring, CMA; 03/24/2019 2:09 PM) Gas Relief Ultra Strength (Oral) Specific strength unknown - Active. Acyclovir (800MG  Tablet, Oral) Active. AmLODIPine Besylate (5MG  Tablet, Oral) Active. Atorvastatin Calcium (40MG  Tablet, Oral) Active. Carvedilol (6.25MG  Tablet, Oral) Active. Oxybutynin Chloride (5MG  Tablet, Oral) Active. PrednisoLONE Acetate (1% Suspension, Ophthalmic) Active. Timolol Maleate (0.5% Solution, Ophthalmic) Active. Aspirin EC (81MG  Tablet DR, Oral) Active. Acetaminophen (500MG  Tablet, Oral) Active. NexIUM (20MG  Capsule DR, Oral) Active. Multivitamins/Minerals (Oral) Active. Medications Reconciled    Review of Systems Ralene Ok, MD; 03/24/2019 2:29 PM) All other systems negative  Vitals (Armen Ferguson CMA; 03/24/2019 2:08 PM) 03/24/2019 2:08 PM Weight: 166.13 lb Height: 73in Body Surface Area: 1.99 m Body Mass Index: 21.92 kg/m  Temp.: 98.62F  Pulse: 71 (Regular)  P.OX: 96% (Room air) BP: 146/82 (Sitting, Left Arm, Standard)       Physical Exam Ralene Ok MD; 03/24/2019 2:27 PM) The physical exam findings are as  follows: Note:Constitutional: No acute distress, conversant, appears stated age  Eyes: Anicteric sclerae, moist conjunctiva, no lid lag  Neck: No thyromegaly, trachea midline, no cervical lymphadenopathy  Lungs: Clear to auscultation biilaterally, normal respiratory effot  Cardiovascular: regular rate & rhythm, no murmurs, no peripheal edema, pedal pulses 2+  GI: Soft, no masses or hepatosplenomegaly, non-tender to palpation, lower midline incision  MSK: Normal gait, no clubbing cyanosis, edema  Skin: No rashes, palpation reveals normal skin turgor  Psychiatric: Appropriate judgment and insight, oriented to person, place, and time  Abdomen Inspection Hernias - Right - Inguinal hernia - Reducible - Right.    Assessment & Plan Ralene Ok MD; 03/24/2019 2:28 PM) RIGHT INGUINAL HERNIA (K40.90) Impression: 79 year old male with a primary right inguinal hernia. Patient has a history of prostate surgery, AVR, hypertension. We'll obtain cardiac clearance from Dr. Oval Linsey his cardiologist. 1. The patient will like to proceed to the operating room for laparoscopic right inguinal hernia repair with mesh.  2. I discussed with the patient the signs and symptoms of incarceration and strangulation and the need to proceed to the ER should they occur.  3. I discussed with the patient the risks and benefits of the procedure to include but not limited to: Infection, bleeding, damage to surrounding structures, possible need for further surgery, possible nerve pain, and possible recurrence. The patient was understanding and wishes to proceed.  I reviewed the patient's external notes from the referring physicians as well as consulting physician team. Each of the radiologic studies and lab studies were independently reviewed and interpreted. I discussed the results of the above studies and how they relate to the patient's surgical problems.

## 2019-04-12 NOTE — Op Note (Signed)
04/12/2019  12:19 PM  PATIENT:  [name] [name]  79 y.o. male  PRE-OPERATIVE DIAGNOSIS:  right inguinal hernia  POST-OPERATIVE DIAGNOSIS:  Right indirect inguinal hernia  PROCEDURE:  Procedure(s): LAPAROSCOPIC RIGHT INGUINAL HERNIA REPAIR WITH MESH (Right)  SURGEON:  Surgeon(s) and Role:    Ralene Ok, MD - Primary  ANESTHESIA:   local and general  EBL:  minimal   BLOOD ADMINISTERED:none  DRAINS: none   LOCAL MEDICATIONS USED:  BUPIVICAINE   SPECIMEN:  No Specimen  DISPOSITION OF SPECIMEN:  N/A  COUNTS:  YES  TOURNIQUET:  * No tourniquets in log *  DICTATION: .Dragon Dictation Counts: reported as correct x 2  Findings:  The patient had a large right indirect hernia.  Indications for procedure:  The patient is a 79 year old male with a right inguinal hernia for several months. Patient complained of symptomatology to his right inguinal area. The patient was taken back for elective inguinal hernia repair.  Details of the procedure: The patient was taken back to the operating room. The patient was placed in supine position with bilateral SCDs in place.  The patient was prepped and draped in the usual sterile fashion.  After appropriate anitbiotics were confirmed, a time-out was confirmed and all facts were verified.  0.25% Marcaine was used to infiltrate the umbilical area. A 11-blade was used to cut down the skin and blunt dissection was used to get the anterior fashion.  The anterior fascia was incised approximately 1 cm and the muscles were retracted laterally. Blunt dissection was then used to create a space in the preperitoneal area. At this time a 10 mm camera was then introduced into the space and advanced the pubic tubercle and a 12 mm trocar was placed over this and insufflation was started.  At this time and space was created from medial to laterally the preperitoneal space.  Cooper's ligament was initially cleaned off.  The hernia sac was identified in the  indirect space. Dissection of the hernia sac and cord structures was undertaken the vas deferens was identified and protected in all parts of the case.  There was a small tear into the hernia sac. The tear was closed using a 0 Endoloop x 1.  Once the hernia sac was taken down to approximately the umbilicus a Bard 3D Max mesh, size: [name] [name], was  introduced into the preperitoneal space.  The mesh was brought over to cover the direct and indirect hernia spaces.  This was anchored into place and secured to Cooper's ligament with 4.90mm staples from a Coviden hernia stapler. It was anchored to the anterior abdominal wall with 4.8 mm staples. The hernia sac was seen lying posterior to the mesh. There was no staples placed laterally. The insufflation was evacuated and the peritoneum was seen posterior to the mesh. The trochars were removed. The anterior fascia was reapproximated using #1 Vicryl on a UR- 6.  Intra-abdominal air was evacuated and the Veress needle removed. The skin was reapproximated using 4-0 Monocryl subcuticular fashion and Dermabond. The patient was awakened from general anesthesia and taken to recovery in stable condition.   PLAN OF CARE: Discharge to home after PACU  PATIENT DISPOSITION:  PACU - hemodynamically stable.   Delay start of Pharmacological VTE agent (>24hrs) due to surgical blood loss or risk of bleeding: not applicable

## 2019-04-12 NOTE — Interval H&P Note (Signed)
History and Physical Interval Note:  04/12/2019 10:46 AM  [name] [name]  has presented today for surgery, with the diagnosis of right inguinal hernia.  The various methods of treatment have been discussed with the patient and family. After consideration of risks, benefits and other options for treatment, the patient has consented to  Procedure(s): LAPAROSCOPIC RIGHT POSSIBLE OPEN INGUINAL HERNIA REPAIR WITH MESH (Right) as a surgical intervention.  The patient's history has been reviewed, patient examined, no change in status, stable for surgery.  I have reviewed the patient's chart and labs.  Questions were answered to the patient's satisfaction.     Ralene Ok

## 2019-04-12 NOTE — Transfer of Care (Signed)
Immediate Anesthesia Transfer of Care Note  Patient: [name] [name]  Procedure(s) Performed: LAPAROSCOPIC RIGHT INGUINAL HERNIA REPAIR WITH MESH (Right )  Patient Location: PACU  Anesthesia Type:General  Level of Consciousness: awake, alert  and oriented  Airway & Oxygen Therapy: Patient Spontanous Breathing and Patient connected to nasal cannula oxygen  Post-op Assessment: Report given to RN, Post -op Vital signs reviewed and stable and Patient moving all extremities  Post vital signs: Reviewed and stable  Last Vitals:  Vitals Value Taken Time  BP 121/75 04/12/19 1250  Temp 36.4 C 04/12/19 1235  Pulse 59 04/12/19 1258  Resp 19 04/12/19 1258  SpO2 100 % 04/12/19 1258  Vitals shown include unvalidated device data.  Last Pain:  Vitals:   04/12/19 0925  TempSrc: Oral         Complications: No apparent anesthesia complications

## 2019-04-12 NOTE — Discharge Instructions (Signed)
General Anesthesia, Adult, Care After This sheet gives you information about how to care for yourself after your procedure. Your health care provider may also give you more specific instructions. If you have problems or questions, contact your health care provider. What can I expect after the procedure? After the procedure, the following side effects are common:  Pain or discomfort at the IV site.  Nausea.  Vomiting.  Sore throat.  Trouble concentrating.  Feeling cold or chills.  Weak or tired.  Sleepiness and fatigue.  Soreness and body aches. These side effects can affect parts of the body that were not involved in surgery. Follow these instructions at home:  For at least 24 hours after the procedure:  Have a responsible adult stay with you. It is important to have someone help care for you until you are awake and alert.  Rest as needed.  Do not: ? Participate in activities in which you could fall or become injured. ? Drive. ? Use heavy machinery. ? Drink alcohol. ? Take sleeping pills or medicines that cause drowsiness. ? Make important decisions or sign legal documents. ? Take care of children on your own. Eating and drinking  Follow any instructions from your health care provider about eating or drinking restrictions.  When you feel hungry, start by eating small amounts of foods that are soft and easy to digest (bland), such as toast. Gradually return to your regular diet.  Drink enough fluid to keep your urine pale yellow.  If you vomit, rehydrate by drinking water, juice, or clear broth. General instructions  If you have sleep apnea, surgery and certain medicines can increase your risk for breathing problems. Follow instructions from your health care provider about wearing your sleep device: ? Anytime you are sleeping, including during daytime naps. ? While taking prescription pain medicines, sleeping medicines, or medicines that make you drowsy.  Return to  your normal activities as told by your health care provider. Ask your health care provider what activities are safe for you.  Take over-the-counter and prescription medicines only as told by your health care provider.  If you smoke, do not smoke without supervision.  Keep all follow-up visits as told by your health care provider. This is important. Contact a health care provider if:  You have nausea or vomiting that does not get better with medicine.  You cannot eat or drink without vomiting.  You have pain that does not get better with medicine.  You are unable to pass urine.  You develop a skin rash.  You have a fever.  You have redness around your IV site that gets worse. Get help right away if:  You have difficulty breathing.  You have chest pain.  You have blood in your urine or stool, or you vomit blood. Summary  After the procedure, it is common to have a sore throat or nausea. It is also common to feel tired.  Have a responsible adult stay with you for the first 24 hours after general anesthesia. It is important to have someone help care for you until you are awake and alert.  When you feel hungry, start by eating small amounts of foods that are soft and easy to digest (bland), such as toast. Gradually return to your regular diet.  Drink enough fluid to keep your urine pale yellow.  Return to your normal activities as told by your health care provider. Ask your health care provider what activities are safe for you. This information is not   intended to replace advice given to you by your health care provider. Make sure you discuss any questions you have with your health care provider. Document Revised: 01/10/2017 Document Reviewed: 08/23/2016 Elsevier Patient Education  2020 [employer] _______[facility]  INGUINAL HERNIA REPAIR: POST OP INSTRUCTIONS  Always review your discharge instruction sheet given to you by the facility where your  surgery was performed. IF YOU HAVE DISABILITY OR FAMILY LEAVE FORMS, YOU MUST BRING THEM TO THE OFFICE FOR PROCESSING.   DO NOT GIVE THEM TO YOUR DOCTOR.  1. A  prescription for pain medication may be given to you upon discharge.  Take your pain medication as prescribed, if needed.  If narcotic pain medicine is not needed, then you may take acetaminophen (Tylenol) or ibuprofen (Advil) as needed. 2. Take your usually prescribed medications unless otherwise directed. If you need a refill on your pain medication, please contact your pharmacy.  They will contact our office to request authorization. Prescriptions will not be filled after 5 pm or on week-ends. 3. You should follow a light diet the first 24 hours after arrival home, such as soup and crackers, etc.  Be sure to include lots of fluids daily.  Resume your normal diet the day after surgery. 4.Most patients will experience some swelling and bruising around the umbilicus or in the groin and scrotum.  Ice packs and reclining will help.  Swelling and bruising can take several days to resolve.  6. It is common to experience some constipation if taking pain medication after surgery.  Increasing fluid intake and taking a stool softener (such as Colace) will usually help or prevent this problem from occurring.  A mild laxative (Milk of Magnesia or Miralax) should be taken according to package directions if there are no bowel movements after 48 hours. 7. Unless discharge instructions indicate otherwise, you may remove your bandages 24-48 hours after surgery, and you may shower at that time.  You may have steri-strips (small skin tapes) in place directly over the incision.  These strips should be left on the skin for 7-10 days.  If your surgeon used skin glue on the incision, you may shower in 24 hours.  The glue will flake off over the next 2-3 weeks.  Any sutures or staples will be removed at the office during your follow-up visit. 8. ACTIVITIES:  You may  resume regular (light) daily activities beginning the next day--such as daily self-care, walking, climbing stairs--gradually increasing activities as tolerated.  You may have sexual intercourse when it is comfortable.  Refrain from any heavy lifting or straining until approved by your doctor.  a.You may drive when you are no longer taking prescription pain medication, you can comfortably wear a seatbelt, and you can safely maneuver your car and apply brakes. b.RETURN TO WORK:   _____________________________________________  9.You should see your doctor in the office for a follow-up appointment approximately 2-3 weeks after your surgery.  Make sure that you call for this appointment within a day or two after you arrive home to insure a convenient appointment time. 10.OTHER INSTRUCTIONS: _________________________    _____________________________________  WHEN TO CALL YOUR DOCTOR: 1. Fever over 101.0 2. Inability to urinate 3. Nausea and/or vomiting 4. Extreme swelling or bruising 5. Continued bleeding from incision. 6. Increased pain, redness, or drainage from the incision  The clinic staff is available to answer your questions during regular business hours.  Please don't hesitate to call and ask to speak to one of   the nurses for clinical concerns.  If you have a medical emergency, go to the nearest emergency room or call 911.  A surgeon from [facility] is always on call at the hospital   [address], [address], [place], [place]  [address] ?  [address] [address], [place], [place]   [address] [phone] ? [phone] ? FAX [phone] Web site: [email or web address]  

## 2019-04-13 ENCOUNTER — Encounter: Payer: Self-pay | Admitting: *Deleted

## 2019-05-04 ENCOUNTER — Other Ambulatory Visit: Payer: Self-pay | Admitting: Cardiovascular Disease

## 2019-05-05 DIAGNOSIS — H4052X3 Glaucoma secondary to other eye disorders, left eye, severe stage: Secondary | ICD-10-CM | POA: Diagnosis not present

## 2019-05-05 DIAGNOSIS — Z961 Presence of intraocular lens: Secondary | ICD-10-CM | POA: Diagnosis not present

## 2019-05-05 DIAGNOSIS — Z947 Corneal transplant status: Secondary | ICD-10-CM | POA: Diagnosis not present

## 2019-05-05 DIAGNOSIS — H4052X4 Glaucoma secondary to other eye disorders, left eye, indeterminate stage: Secondary | ICD-10-CM | POA: Diagnosis not present

## 2019-05-05 DIAGNOSIS — H16122 Filamentary keratitis, left eye: Secondary | ICD-10-CM | POA: Diagnosis not present

## 2019-05-05 DIAGNOSIS — H5712 Ocular pain, left eye: Secondary | ICD-10-CM | POA: Diagnosis not present

## 2019-05-11 DIAGNOSIS — M1712 Unilateral primary osteoarthritis, left knee: Secondary | ICD-10-CM | POA: Diagnosis not present

## 2019-05-11 DIAGNOSIS — M1991 Primary osteoarthritis, unspecified site: Secondary | ICD-10-CM | POA: Diagnosis not present

## 2019-05-11 DIAGNOSIS — M25561 Pain in right knee: Secondary | ICD-10-CM | POA: Diagnosis not present

## 2019-05-11 DIAGNOSIS — M25562 Pain in left knee: Secondary | ICD-10-CM | POA: Diagnosis not present

## 2019-05-17 DIAGNOSIS — M25562 Pain in left knee: Secondary | ICD-10-CM | POA: Diagnosis not present

## 2019-05-17 DIAGNOSIS — M25561 Pain in right knee: Secondary | ICD-10-CM | POA: Diagnosis not present

## 2019-05-17 DIAGNOSIS — M1712 Unilateral primary osteoarthritis, left knee: Secondary | ICD-10-CM | POA: Diagnosis not present

## 2019-05-17 DIAGNOSIS — M1991 Primary osteoarthritis, unspecified site: Secondary | ICD-10-CM | POA: Diagnosis not present

## 2019-05-19 DIAGNOSIS — M25562 Pain in left knee: Secondary | ICD-10-CM | POA: Diagnosis not present

## 2019-05-19 DIAGNOSIS — M25561 Pain in right knee: Secondary | ICD-10-CM | POA: Diagnosis not present

## 2019-05-19 DIAGNOSIS — M1712 Unilateral primary osteoarthritis, left knee: Secondary | ICD-10-CM | POA: Diagnosis not present

## 2019-05-19 DIAGNOSIS — M1991 Primary osteoarthritis, unspecified site: Secondary | ICD-10-CM | POA: Diagnosis not present

## 2019-05-25 DIAGNOSIS — M25561 Pain in right knee: Secondary | ICD-10-CM | POA: Diagnosis not present

## 2019-05-25 DIAGNOSIS — M1712 Unilateral primary osteoarthritis, left knee: Secondary | ICD-10-CM | POA: Diagnosis not present

## 2019-05-25 DIAGNOSIS — M25562 Pain in left knee: Secondary | ICD-10-CM | POA: Diagnosis not present

## 2019-05-25 DIAGNOSIS — M1991 Primary osteoarthritis, unspecified site: Secondary | ICD-10-CM | POA: Diagnosis not present

## 2019-05-27 DIAGNOSIS — M1712 Unilateral primary osteoarthritis, left knee: Secondary | ICD-10-CM | POA: Diagnosis not present

## 2019-05-27 DIAGNOSIS — M25561 Pain in right knee: Secondary | ICD-10-CM | POA: Diagnosis not present

## 2019-05-27 DIAGNOSIS — M25562 Pain in left knee: Secondary | ICD-10-CM | POA: Diagnosis not present

## 2019-05-27 DIAGNOSIS — M1991 Primary osteoarthritis, unspecified site: Secondary | ICD-10-CM | POA: Diagnosis not present

## 2019-06-03 DIAGNOSIS — M1712 Unilateral primary osteoarthritis, left knee: Secondary | ICD-10-CM | POA: Diagnosis not present

## 2019-06-03 DIAGNOSIS — M1991 Primary osteoarthritis, unspecified site: Secondary | ICD-10-CM | POA: Diagnosis not present

## 2019-06-03 DIAGNOSIS — M25562 Pain in left knee: Secondary | ICD-10-CM | POA: Diagnosis not present

## 2019-06-03 DIAGNOSIS — M25561 Pain in right knee: Secondary | ICD-10-CM | POA: Diagnosis not present

## 2019-06-23 ENCOUNTER — Other Ambulatory Visit: Payer: Self-pay

## 2019-06-23 ENCOUNTER — Encounter: Payer: Self-pay | Admitting: General Practice

## 2019-06-23 ENCOUNTER — Ambulatory Visit: Payer: Medicare HMO | Admitting: General Practice

## 2019-06-23 VITALS — BP 140/82 | HR 63 | Temp 98.4°F | Ht 73.0 in | Wt 167.0 lb

## 2019-06-23 DIAGNOSIS — I25110 Atherosclerotic heart disease of native coronary artery with unstable angina pectoris: Secondary | ICD-10-CM

## 2019-06-23 DIAGNOSIS — R600 Localized edema: Secondary | ICD-10-CM | POA: Diagnosis not present

## 2019-06-23 DIAGNOSIS — I119 Hypertensive heart disease without heart failure: Secondary | ICD-10-CM

## 2019-06-23 DIAGNOSIS — Z953 Presence of xenogenic heart valve: Secondary | ICD-10-CM | POA: Diagnosis not present

## 2019-06-23 DIAGNOSIS — E7800 Pure hypercholesterolemia, unspecified: Secondary | ICD-10-CM

## 2019-06-23 NOTE — Patient Instructions (Signed)
Medication Instructions:  The current medical regimen is effective;  continue present plan and medications as directed. Please refer to the Current Medication list given to you today. *If you need a refill on your cardiac medications before your next appointment, please call your pharmacy*  Special Instructions PLEASE PURCHASE AND WEAR COMPRESSION STOCKINGS DAILY AND OFF AT BEDTIME. Compression stockings are elastic socks that squeeze the legs. They help to increase blood flow to the legs and to decrease swelling in the legs from fluid retention, and reduce the chance of developing blood clots in the lower legs. Please put on in the AM when dressing and off at night when dressing for bed. PLEASE MAKE SURE TO ELEVATE YOU LEGS WHILE SITTING, THIS WILL HELP WITH THE SWELLING ALSO.  PLEASE READ AND FOLLOW SALTY 6-ATTACHED  CONTINUE TO TAKE ANTIBIOTICS FOR DENTAL PROCEDURES  TAKE AND LOG YOUR BP COUPLE TIMES WEEKLY AT DIFFERENT TIMES  Follow-Up: Your next appointment:  3 month(s) Please call our office 2 months in advance to schedule this appointment Either In Person or Virtual with [name], MD  At [facility], you and your health needs are our priority.  As part of our continuing mission to provide you with exceptional heart care, we have created designated Provider Care Teams.  These Care Teams include your primary Cardiologist (physician) and Advanced Practice Providers (APPs -  Physician Assistants and Nurse Practitioners) who all work together to provide you with the care you need, when you need it.  We recommend signing up for the patient portal called "MyChart".  Sign up information is provided on this After Visit Summary.  MyChart is used to connect with patients for Virtual Visits (Telemedicine).  Patients are able to view lab/test results, encounter notes, upcoming appointments, etc.  Non-urgent messages can be sent to your provider as well.   To learn more about what you can do  with MyChart, go to [email or web address].

## 2019-06-23 NOTE — Progress Notes (Signed)
Cardiology Clinic Note   Patient Name: [name] [name] Date of Encounter: 06/23/2019  Primary Care Provider:  [name], MD Primary Cardiologist:  [name], MD  Patient Profile    [name] [name] 79 year old male presents today for follow-up evaluation of his hypertension, CAD, and aortic stenosis.  Past Medical History    Past Medical History:  Diagnosis Date   Arthritis    knee   Cataracts, bilateral    Chronic kidney disease    stage 2   GERD (gastroesophageal reflux disease)    Glaucoma    Hearing loss    wears hearing aids   History of kidney stones    passed stones and 1 time had surgery   Hyperlipidemia    Hypertension    Inguinal hernia 03/2019   Pure hypercholesterolemia 03/23/2019   S/P aortic valve replacement with bioprosthetic valve 03/23/2019   Bioprosthetic 09/2014   Shingles    Varicose veins 03/31/2015   Wears partial dentures    upper   Past Surgical History:  Procedure Laterality Date   AORTIC VALVE REPLACEMENT N/A 09/28/2014   Procedure: AORTIC VALVE REPLACEMENT (AVR);  Surgeon: [name], MD;  Location: [facility];  Service: Open Heart Surgery;  Laterality: N/A;   CARDIAC CATHETERIZATION N/A 09/23/2014   Procedure: Left Heart Cath and Coronary Angiography;  Surgeon: [name], MD;  Location: [facility];  Service: Cardiovascular;  Laterality: N/A;   CATARACT EXTRACTION     CHOLECYSTECTOMY N/A 10/28/2016   Procedure: LAPAROSCOPIC CHOLECYSTECTOMY;  Surgeon: Ralene Ok, MD;  Location: [facility];  Service: General;  Laterality: N/A;   COLONOSCOPY     CORONARY ARTERY BYPASS GRAFT N/A 09/28/2014   Procedure: CORONARY ARTERY BYPASS GRAFTING (CABG) x4 using left internal mammory artery and right saphenous leg vein harvested endoscopically.;  Surgeon: [name], MD;  Location: [facility];  Service: Open Heart Surgery;  Laterality: N/A;   INGUINAL HERNIA REPAIR Right 04/12/2019   Procedure: LAPAROSCOPIC  RIGHT INGUINAL HERNIA REPAIR WITH MESH;  Surgeon: Ralene Ok, MD;  Location: [facility];  Service: General;  Laterality: Right;   kidney stone removal     PROSTATECTOMY     TEE WITHOUT CARDIOVERSION N/A 09/28/2014   Procedure: TRANSESOPHAGEAL ECHOCARDIOGRAM (TEE);  Surgeon: [name], MD;  Location: [facility];  Service: Open Heart Surgery;  Laterality: N/A;   TONSILLECTOMY     WISDOM TOOTH EXTRACTION      Allergies  Allergies  Allergen Reactions   Hydrochlorothiazide Swelling    History of Present Illness    Mr. [name] has a PMH of hypertension, CAD status post CABG (LIMA-LAD, SVG OM1, SVG PDA), aortic stenosis status post AVR, bradycardia and syncope.  He underwent a four-vessel CABG and AVR on 09/28/2014.  His postoperative course was complicated by atrial fibrillation/flutter however, he was discharged in sinus rhythm.  He had an episode of lightheadedness 8/17 and was sent to the ED.  He was noted to have acute renal failure and was felt to be dehydrated.  He had been working in the yard for several hours a day before and was not well-hydrated.  His metoprolol was switched to carvedilol due to bradycardia and hypertension.  He was also started on amlodipine therefore his simvastatin was switched to atorvastatin.  He was last seen by Dr. Oval Linsey on 03/23/2019.  During that time he was doing well.  He has been checking his blood pressure often and noticed it was in the 140s over  52s and 70s.  He had been feeling well overall.  He did indicate he was struggling with left knee pain.  However, this is somewhat improved with knee injections.  He was also having right lower extremity edema which was chronic.  Previously he had undergone lower extremity Dopplers that were negative for DVT.  Compression stockings were recommended by his PCP which were helping when he would wear them.  He denied chest pain and shortness of breath.  He was walking for exercise and had no exertional chest  pain.  He underwent right inguinal hernia repair on 04/12/2019 and tolerated the procedure well.  He presents the clinic today for follow-up evaluation and states he feels well.  His blood pressures been much better controlled at home in the 120s-110s over 60s-70s.  He has not had any episodes of lightheadedness or dizziness.  He states he is not adding sodium to his food and he has been wearing his lower extremity compression stockings.  I will give him the salty 6 diet sheet, have him continue wearing his compression stockings, and have him follow-up with Dr. Oval Linsey in 3 months.  Today he denies chest pain, shortness of breath, lower extremity edema, fatigue, palpitations, melena, hematuria, hemoptysis, diaphoresis, weakness, presyncope, syncope, orthopnea, and PND.   Home Medications    Prior to Admission medications   Medication Sig Start Date End Date Taking? Authorizing Provider  acetaminophen (TYLENOL) 500 MG tablet Take 1,000 mg by mouth every 6 (six) hours as needed for headache (pain).     [provider]  acyclovir (ZOVIRAX) 800 MG tablet Take 400 mg by mouth in the morning and at bedtime. 01/26/19   [provider]  amLODipine (NORVASC) 10 MG tablet Take 1 tablet (10 mg total) by mouth daily. 03/23/19   [name], MD  aspirin EC 81 MG tablet Take 1 tablet (81 mg total) by mouth daily. Patient taking differently: Take 81 mg by mouth at bedtime.  11/01/14   [name], MD  atorvastatin (LIPITOR) 40 MG tablet Take 1 tablet (40 mg total) by mouth at bedtime. 01/29/19   [name], MD  carvedilol (COREG) 6.25 MG tablet Take 1 tablet (6.25 mg total) by mouth 2 (two) times daily. 01/29/19   [name], MD  esomeprazole (NEXIUM) 20 MG capsule Take 20 mg by mouth every other day. Reported on 03/01/2015    [provider]  lisinopril (ZESTRIL) 20 MG tablet TAKE 1 TABLET TWICE DAILY 05/05/19   [name], MD  meloxicam (MOBIC) 15 MG tablet  Take 15 mg by mouth at bedtime.     [provider]  Multiple Vitamins-Minerals (MULTIVITAMIN WITH MINERALS) tablet Take 1 tablet by mouth daily.    [provider]  oxybutynin (DITROPAN) 5 MG tablet Take 5 mg by mouth 2 (two) times daily.  10/22/16   [provider]  prednisoLONE acetate (PRED FORTE) 1 % ophthalmic suspension Place 1 drop into the left eye in the morning, at noon, and at bedtime.     [provider]  timolol (TIMOPTIC) 0.5 % ophthalmic solution Place 1 drop into the left eye 2 (two) times daily. 09/30/16   [provider]  traMADol (ULTRAM) 50 MG tablet Take 1 tablet (50 mg total) by mouth every 6 (six) hours as needed. 04/12/19 04/11/20  Ralene Ok, MD    Family History    Family History  Problem Relation Age of Onset   Ovarian cancer Mother    He indicated that  the status of his mother is unknown.  Social History    Social History   Socioeconomic History   Marital status: Married    Spouse name: Not on file   Number of children: Not on file   Years of education: Not on file   Highest education level: Not on file  Occupational History   Not on file  Tobacco Use   Smoking status: Former Smoker    Types: Cigarettes    Quit date: 05/22/1980    Years since quitting: 39.1   Smokeless tobacco: Never Used   Tobacco comment: quit smoking in 1982  Substance and Sexual Activity   Alcohol use: Yes    Alcohol/week: 0.0 standard drinks    Comment: occasional liquor/ Beer   Drug use: No   Sexual activity: Yes  Other Topics Concern   Not on file  Social History Narrative   Not on file   Social Determinants of Health   Financial Resource Strain:    Difficulty of Paying Living Expenses:   Food Insecurity:    Worried About Charity fundraiser in the Last Year:    Arboriculturist in the Last Year:   Transportation Needs:    Film/video editor (Medical):    Lack of Transportation (Non-Medical):    Physical Activity:    Days of Exercise per Week:    Minutes of Exercise per Session:   Stress:    Feeling of Stress :   Social Connections:    Frequency of Communication with Friends and Family:    Frequency of Social Gatherings with Friends and Family:    Attends Religious Services:    Active Member of Clubs or Organizations:    Attends Music therapist:    Marital Status:   Intimate Partner Violence:    Fear of Current or Ex-Partner:    Emotionally Abused:    Physically Abused:    Sexually Abused:      Review of Systems    General:  No chills, fever, night sweats or weight changes.  Cardiovascular:  No chest pain, dyspnea on exertion, edema, orthopnea, palpitations, paroxysmal nocturnal dyspnea. Dermatological: No rash, lesions/masses Respiratory: No cough, dyspnea Urologic: No hematuria, dysuria Abdominal:   No nausea, vomiting, diarrhea, bright red blood per rectum, melena, or hematemesis Neurologic:  No visual changes, wkns, changes in mental status. All other systems reviewed and are otherwise negative except as noted above.  Physical Exam    VS:  BP 140/82 (BP Location: Left Arm, Patient Position: Sitting, Cuff Size: Normal)    Pulse 63    Temp 98.4 F (36.9 C)    Ht 6\' 1"  (1.854 m)    Wt 167 lb (75.8 kg)    BMI 22.03 kg/m  , BMI Body mass index is 22.03 kg/m. GEN: Well nourished, well developed, in no acute distress. HEENT: normal. Neck: Supple, no JVD, carotid bruits, or masses. Cardiac: RRR, no murmurs, rubs, or gallops. No clubbing, cyanosis, generalized nonpitting lower extremity edema.  Radials/DP/PT 2+ and equal bilaterally.  Respiratory:  Respirations regular and unlabored, clear to auscultation bilaterally. GI: Soft, nontender, nondistended, BS + x 4. MS: no deformity or atrophy. Skin: warm and dry, no rash. Neuro:  Strength and sensation are intact. Psych: Normal affect.  Accessory Clinical Findings    ECG personally  reviewed by me today-none today.  EKG 03/21/17:  Sinus rhythm.  Rate 60 bpm.  Left axis deviation.  EKG 03/23/18:  Sinus bradycardia.  Rate  54 bpm.  First degree AV block.  LAFB.   EKG 03/23/19:  Sinus rhythm.  Rate 60 bpm.  Left axis deviation.  Prior anteroseptal infarct.  IVCD.  Echocardiogram 11/01/14: Study Conclusions  - Left ventricle: The cavity size was normal. Wall thickness was increased in a pattern of moderate LVH. Systolic function was normal. The estimated ejection fraction was in the range of 55% to 60%. Wall motion was normal; there were no regional wall motion abnormalities. - Aortic valve: Normal appearing tissue AVR with no peri valvular regurgitation. - Mitral valve: There was mild regurgitation. - Left atrium: The atrium was mildly dilated. - Atrial septum: No defect or patent foramen ovale was identified.  Assessment & Plan   1.  Hypertensive heart disease-BP today 140/82.  110-120 over 60s-70s at home. Continue amlodipine, carvedilol, and lisinopril Heart healthy low-sodium diet-salty 6 given Increase physical activity as tolerated Avoid hypertension triggers-information sheet given Continue blood pressure log  Hyperlipidemia-LDL 55 on 11/19 Continue atorvastatin Heart healthy low-sodium high-fiber diet Increase physical activity as tolerated  Coronary artery disease-no chest pain today.  Status post CABG Continue aspirin, carvedilol, and atorvastatin Heart healthy low-sodium diet Increase physical activity as tolerated  Aortic valve replacement-status post bioprosthetic AVR.  No increased dyspnea or activity intolerance. Continue SBE prophylaxis  Lower extremity edema-chronic.  (Generalized lower extremity nonpitting) underwent lower extremity Dopplers 3/20 which showed no DVT Continue lower extremity support stockings Heart healthy low-sodium diet Increase physical activity as tolerated  Disposition: Follow-up with Dr. Oval Linsey in 3  months.  Jossie Ng. Mann Skaggs NP-C    06/23/2019, 11:34 AM [place] [place] [address] Office [phone] Fax [phone]

## 2019-06-24 DIAGNOSIS — E7800 Pure hypercholesterolemia, unspecified: Secondary | ICD-10-CM | POA: Diagnosis not present

## 2019-06-24 DIAGNOSIS — G250 Essential tremor: Secondary | ICD-10-CM | POA: Diagnosis not present

## 2019-06-24 DIAGNOSIS — I517 Cardiomegaly: Secondary | ICD-10-CM | POA: Diagnosis not present

## 2019-06-24 DIAGNOSIS — I1310 Hypertensive heart and chronic kidney disease without heart failure, with stage 1 through stage 4 chronic kidney disease, or unspecified chronic kidney disease: Secondary | ICD-10-CM | POA: Diagnosis not present

## 2019-06-24 DIAGNOSIS — K219 Gastro-esophageal reflux disease without esophagitis: Secondary | ICD-10-CM | POA: Diagnosis not present

## 2019-06-24 DIAGNOSIS — I2510 Atherosclerotic heart disease of native coronary artery without angina pectoris: Secondary | ICD-10-CM | POA: Diagnosis not present

## 2019-06-24 DIAGNOSIS — N182 Chronic kidney disease, stage 2 (mild): Secondary | ICD-10-CM | POA: Diagnosis not present

## 2019-06-24 DIAGNOSIS — Z0000 Encounter for general adult medical examination without abnormal findings: Secondary | ICD-10-CM | POA: Diagnosis not present

## 2019-07-28 DIAGNOSIS — Z8546 Personal history of malignant neoplasm of prostate: Secondary | ICD-10-CM | POA: Diagnosis not present

## 2019-08-02 DIAGNOSIS — R3915 Urgency of urination: Secondary | ICD-10-CM | POA: Diagnosis not present

## 2019-08-02 DIAGNOSIS — C61 Malignant neoplasm of prostate: Secondary | ICD-10-CM | POA: Diagnosis not present

## 2019-08-02 DIAGNOSIS — N5201 Erectile dysfunction due to arterial insufficiency: Secondary | ICD-10-CM | POA: Diagnosis not present

## 2019-09-22 ENCOUNTER — Other Ambulatory Visit: Payer: Self-pay | Admitting: Cardiovascular Disease

## 2019-09-28 ENCOUNTER — Ambulatory Visit: Payer: Medicare HMO | Admitting: Cardiovascular Disease

## 2019-09-28 ENCOUNTER — Other Ambulatory Visit: Payer: Self-pay

## 2019-09-28 ENCOUNTER — Encounter: Payer: Self-pay | Admitting: Cardiovascular Disease

## 2019-09-28 VITALS — BP 153/78 | HR 58 | Ht 73.0 in | Wt 171.4 lb

## 2019-09-28 DIAGNOSIS — Z5181 Encounter for therapeutic drug level monitoring: Secondary | ICD-10-CM | POA: Diagnosis not present

## 2019-09-28 DIAGNOSIS — Z951 Presence of aortocoronary bypass graft: Secondary | ICD-10-CM | POA: Diagnosis not present

## 2019-09-28 DIAGNOSIS — I10 Essential (primary) hypertension: Secondary | ICD-10-CM | POA: Diagnosis not present

## 2019-09-28 DIAGNOSIS — R001 Bradycardia, unspecified: Secondary | ICD-10-CM

## 2019-09-28 DIAGNOSIS — I25110 Atherosclerotic heart disease of native coronary artery with unstable angina pectoris: Secondary | ICD-10-CM

## 2019-09-28 MED ORDER — AMLODIPINE BESYLATE 5 MG PO TABS: 5.0000 mg | ORAL_TABLET | Freq: Every day | ORAL | 1 refills | Status: DC

## 2019-09-28 MED ORDER — SPIRONOLACTONE 25 MG PO TABS: 25.0000 mg | ORAL_TABLET | Freq: Every day | ORAL | 3 refills | Status: DC

## 2019-09-28 NOTE — Progress Notes (Signed)
Cardiology Office Note   Date:  09/28/2019   ID:  [name], [name] 1940-02-20, MRN [id number]  PCP:  [name], MD  Cardiologist:   [name], MD  CT Surgeon: [name]  No chief complaint on file.   Patient ID: [name] [name] is a 79 y.o. male with hypertension, CAD s/p CABG (LIMA-->LAD, SVG-->RI/OM1, SVG-->PDA), aortic stenosis s/p AVR, bradycardia, and syncope who presents for follow up.  [name] [name] underwent 4 vessel CABG and AVR (25 mm [facility] Ease pericardial tissue valve) on 09/28/14.  His post-operative course was complicated by atrial fibrillation/flutter, but he was discharged in sinus rhythm.  He had an episode of lightheadedness in 08/2015 and was seen in the ED.  He was noted to have acute renal failure and was felt to be dehydrated. He had been working in the yard for several hours the day before and was not well hydrated.  Metoprolol was switched to carvedilol due to bradycardia and hypertension.  He was also started on amlodipine, so simvastatin was switched to atorvastatin.   [name] [name] has some right lower extremity edema that is chronic.  He had lower extremity Dopplers 03/2018 that were negative for DVT.  His PCP recommended a compression stocking which does seem to help when he wears it. At his last appointment his blood pressure was elevated.  He had some family stressors and his nephew had recently been diagnosed with pancreatic cancer.  Amlodipine was increased to 10 mg.  He followed up with [name], NP, on 06/2019 and his blood pressure was still above goal.  He was instructed to work on diet and exercise.  He brings his BP log and it has been ranging 100s-120s/50-70.  He has noticed some swelling especially in the R leg.  He denies orthopnea or PND.  He doesn't walk as much due to arthritis in his R knee.  He notes that his edema has been worse since he increased amlodipine.  He is otherwise been feeling well and without complaint.   Past  Medical History:  Diagnosis Date  . Arthritis    knee  . Cataracts, bilateral   . Chronic kidney disease    stage 2  . GERD (gastroesophageal reflux disease)   . Glaucoma   . Hearing loss    wears hearing aids  . History of kidney stones    passed stones and 1 time had surgery  . Hyperlipidemia   . Hypertension   . Inguinal hernia 03/2019  . Pure hypercholesterolemia 03/23/2019  . S/P aortic valve replacement with bioprosthetic valve 03/23/2019   Bioprosthetic 09/2014  . Shingles   . Varicose veins 03/31/2015  . Wears partial dentures    upper    Past Surgical History:  Procedure Laterality Date  . AORTIC VALVE REPLACEMENT N/A 09/28/2014   Procedure: AORTIC VALVE REPLACEMENT (AVR);  Surgeon: [name], MD;  Location: [place];  Service: Open Heart Surgery;  Laterality: N/A;  . CARDIAC CATHETERIZATION N/A 09/23/2014   Procedure: Left Heart Cath and Coronary Angiography;  Surgeon: [name], MD;  Location: [place] [facility];  Service: Cardiovascular;  Laterality: N/A;  . CATARACT EXTRACTION    . CHOLECYSTECTOMY N/A 10/28/2016   Procedure: LAPAROSCOPIC CHOLECYSTECTOMY;  Surgeon: Ralene Ok, MD;  Location: [place];  Service: General;  Laterality: N/A;  . COLONOSCOPY    . CORONARY ARTERY BYPASS GRAFT N/A 09/28/2014   Procedure: CORONARY ARTERY BYPASS GRAFTING (CABG) x4 using left internal mammory  artery and right saphenous leg vein harvested endoscopically.;  Surgeon: [name], MD;  Location: [place];  Service: Open Heart Surgery;  Laterality: N/A;  . INGUINAL HERNIA REPAIR Right 04/12/2019   Procedure: LAPAROSCOPIC RIGHT INGUINAL HERNIA REPAIR WITH MESH;  Surgeon: Ralene Ok, MD;  Location: [place];  Service: General;  Laterality: Right;  . kidney stone removal    . PROSTATECTOMY    . TEE WITHOUT CARDIOVERSION N/A 09/28/2014   Procedure: TRANSESOPHAGEAL ECHOCARDIOGRAM (TEE);  Surgeon: [name], MD;  Location: [place];  Service: Open Heart Surgery;   Laterality: N/A;  . TONSILLECTOMY    . WISDOM TOOTH EXTRACTION       Current Outpatient Medications  Medication Sig Dispense Refill  . acetaminophen (TYLENOL) 500 MG tablet Take 1,000 mg by mouth every 6 (six) hours as needed for headache (pain).     Marland Kitchen acyclovir (ZOVIRAX) 800 MG tablet Take 400 mg by mouth in the morning and at bedtime.    Marland Kitchen amLODipine (NORVASC) 5 MG tablet Take 1 tablet (5 mg total) by mouth daily. 90 tablet 1  . aspirin EC 81 MG tablet Take 1 tablet (81 mg total) by mouth daily. (Patient taking differently: Take 81 mg by mouth at bedtime. ) 90 tablet 3  . atorvastatin (LIPITOR) 40 MG tablet TAKE 1 TABLET BY MOUTH AT BEDTIME. 90 tablet 3  . carvedilol (COREG) 6.25 MG tablet TAKE 1 TABLET TWICE DAILY 180 tablet 3  . esomeprazole (NEXIUM) 20 MG capsule Take 20 mg by mouth every other day. Reported on 03/01/2015    . lisinopril (ZESTRIL) 20 MG tablet TAKE 1 TABLET TWICE DAILY 180 tablet 3  . meloxicam (MOBIC) 15 MG tablet Take 15 mg by mouth at bedtime.     . Multiple Vitamins-Minerals (MULTIVITAMIN WITH MINERALS) tablet Take 1 tablet by mouth daily.    Marland Kitchen oxybutynin (DITROPAN) 5 MG tablet Take 5 mg by mouth 2 (two) times daily.     . prednisoLONE acetate (PRED FORTE) 1 % ophthalmic suspension Place 1 drop into the left eye in the morning, at noon, and at bedtime.     . timolol (TIMOPTIC) 0.5 % ophthalmic solution Place 1 drop into the left eye 2 (two) times daily.    Marland Kitchen spironolactone (ALDACTONE) 25 MG tablet Take 1 tablet (25 mg total) by mouth daily. 90 tablet 3   No current facility-administered medications for this visit.    Allergies:   Hydrochlorothiazide    Social History:  The patient  reports that he quit smoking about 39 years ago. His smoking use included cigarettes. He has never used smokeless tobacco. He reports current alcohol use. He reports that he does not use drugs.   Family History:  The patient's family history includes Ovarian cancer in his mother.     ROS:  Please see the history of present illness.   Otherwise, review of systems are positive for none.   All other systems are reviewed and negative.    PHYSICAL EXAM: VS:  BP (!) 153/78   Pulse (!) 58   Ht 6\' 1"  (1.854 m)   Wt 171 lb 6.4 oz (77.7 kg)   SpO2 97%   BMI 22.61 kg/m  , BMI Body mass index is 22.61 kg/m. GENERAL:  Well appearing HEENT: Pupils equal round and reactive, fundi not visualized, oral mucosa unremarkable NECK:  No jugular venous distention, waveform within normal limits, carotid upstroke brisk and symmetric, no bruits LUNGS:  Clear to auscultation bilaterally HEART:  RRR.  PMI not displaced or sustained,S1 and S2 within normal limits, no S3, no S4, no clicks, no rubs, III/VI systolic murmur at the LUSB at apex ABD:  Flat, positive bowel sounds normal in frequency in pitch, no bruits, no rebound, no guarding, no midline pulsatile mass, no hepatomegaly, no splenomegaly EXT:  2 plus pulses throughout, no edema, no cyanosis no clubbing SKIN:  No rashes no nodules NEURO:  Cranial nerves II through XII grossly intact, motor grossly intact throughout PSYCH:  Cognitively intact, oriented to person place and time   Recent Labs: 04/07/2019: BUN 31; Creatinine, Ser 0.95; Hemoglobin 15.5; Platelets 195; Potassium 4.5; Sodium 138   11/25/2017: Total cholesterol 114, triglycerides 91, HDL 40, LDL 55 Potassium 4.9, creatinine 0.9 Lipid Panel No results found for: CHOL, TRIG, HDL, CHOLHDL, VLDL, LDLCALC, LDLDIRECT    Wt Readings from Last 3 Encounters:  09/28/19 171 lb 6.4 oz (77.7 kg)  06/23/19 167 lb (75.8 kg)  04/12/19 163 lb (73.9 kg)    EKG 03/21/17: Sinus rhythm.  Rate 60 bpm.  Left axis deviation.  03/23/18: Sinus bradycardia.  Rate 54 bpm.  First degree AV block.  LAFB.   03/23/19: Sinus rhythm.  Rate 60 bpm.  Left axis deviation.  Prior anteroseptal infarct.  IVCD.  Echo 11/01/14: Study Conclusions  - Left ventricle: The cavity size was normal. Wall  thickness was increased in a pattern of moderate LVH. Systolic function was normal. The estimated ejection fraction was in the range of 55% to 60%. Wall motion was normal; there were no regional wall motion abnormalities. - Aortic valve: Normal appearing tissue AVR with no peri valvular regurgitation. - Mitral valve: There was mild regurgitation. - Left atrium: The atrium was mildly dilated. - Atrial septum: No defect or patent foramen ovale was identified.  ASSESSMENT AND PLAN:  # Hypertensive heart disease:  BP is elevated here but much better controlled at home.  However he is having increased lower extremity edema.  We will cut the amlodipine back to 5 mg daily.  We will start spironolactone 25 mg daily.  Check a BMP in a week.  Continue carvedilol and lisinopril.   # Hyperlipidemia:   Continue atorvastatin.  LDL was 55 on 06/2019.  # CAD: S/p CABG and doing well.  No angina.  Continue aspirin, carvedilol and atorvastatin.  He was encouraged to get back into his exercise routine.  # s/p AVR: Mr. Maese has a bioprosthetic AVR and is doing well.  He needs antibiotic prophylaxis prior to dental procedures.  # LE Edema: Chronic.  No DVT on lower extremity Dopplers 03/2018.  Worse since increasing amlodipine.  Reduce to 5 mg as above.  Current medicines are reviewed at length with the patient today.  The patient does not have concerns regarding medicines.  The following changes have been made:  none  Labs/ tests ordered today include:   Orders Placed This Encounter  Procedures  . Basic metabolic panel     Disposition:   FU with Cammy Sanjurjo C. Oval Linsey, MD in 2 months virtually.  Signed, [name], MD  09/28/2019 1:58 PM    [facility]

## 2019-09-28 NOTE — Patient Instructions (Signed)
Medication Instructions:  DECREASE YOUR AMLODIPINE TO 5 MG DAILY   START SPIRONOLACTONE 25 MG DAILY   *If you need a refill on your cardiac medications before your next appointment, please call your pharmacy*  Lab Work: BMET 1 WEEK   If you have labs (blood work) drawn today and your tests are completely normal, you will receive your results only by: Marland Kitchen MyChart Message (if you have MyChart) OR . A paper copy in the mail If you have any lab test that is abnormal or we need to change your treatment, we will call you to review the results.  Testing/Procedures: NONE  Follow-Up: At [facility], you and your health needs are our priority.  As part of our continuing mission to provide you with exceptional heart care, we have created designated Provider Care Teams.  These Care Teams include your primary Cardiologist (physician) and Advanced Practice Providers (APPs -  Physician Assistants and Nurse Practitioners) who all work together to provide you with the care you need, when you need it.  We recommend signing up for the patient portal called "MyChart".  Sign up information is provided on this After Visit Summary.  MyChart is used to connect with patients for Virtual Visits (Telemedicine).  Patients are able to view lab/test results, encounter notes, upcoming appointments, etc.  Non-urgent messages can be sent to your provider as well.   To learn more about what you can do with MyChart, go to [email or web address].    Your next appointment:   2 month(s)  The format for your next appointment:   Virtual Visit   Provider:   You may see [name], MD or one of the following Advanced Practice Providers on your designated Care Team:    Kerin Ransom, PA-C  [place], [place]  [name],   Other Instructions MONITOR AND LOG YOUR BLOOD PRESSURE AT HOME, [place]

## 2019-10-05 DIAGNOSIS — Z5181 Encounter for therapeutic drug level monitoring: Secondary | ICD-10-CM | POA: Diagnosis not present

## 2019-10-05 LAB — BASIC METABOLIC PANEL
BUN/Creatinine Ratio: 19 (ref 10–24)
BUN: 23 mg/dL (ref 8–27)
CO2: 25 mmol/L (ref 20–29)
Calcium: 9.9 mg/dL (ref 8.6–10.2)
Chloride: 101 mmol/L (ref 96–106)
Creatinine, Ser: 1.2 mg/dL (ref 0.76–1.27)
GFR calc Af Amer: 66 mL/min/{1.73_m2} (ref >59)
GFR calc non Af Amer: 57 mL/min/{1.73_m2} — ABNORMAL LOW (ref >59)
Glucose: 99 mg/dL (ref 65–99)
Potassium: 4.8 mmol/L (ref 3.5–5.2)
Sodium: 138 mmol/L (ref 134–144)

## 2019-10-18 DIAGNOSIS — L820 Inflamed seborrheic keratosis: Secondary | ICD-10-CM | POA: Diagnosis not present

## 2019-10-18 DIAGNOSIS — L905 Scar conditions and fibrosis of skin: Secondary | ICD-10-CM | POA: Diagnosis not present

## 2019-10-18 DIAGNOSIS — L821 Other seborrheic keratosis: Secondary | ICD-10-CM | POA: Diagnosis not present

## 2019-10-18 DIAGNOSIS — Z85828 Personal history of other malignant neoplasm of skin: Secondary | ICD-10-CM | POA: Diagnosis not present

## 2019-10-18 DIAGNOSIS — L578 Other skin changes due to chronic exposure to nonionizing radiation: Secondary | ICD-10-CM | POA: Diagnosis not present

## 2019-10-18 DIAGNOSIS — L570 Actinic keratosis: Secondary | ICD-10-CM | POA: Diagnosis not present

## 2019-10-19 DIAGNOSIS — Z23 Encounter for immunization: Secondary | ICD-10-CM | POA: Diagnosis not present

## 2019-11-23 ENCOUNTER — Telehealth: Payer: Self-pay | Admitting: *Deleted

## 2019-11-23 ENCOUNTER — Telehealth (INDEPENDENT_AMBULATORY_CARE_PROVIDER_SITE_OTHER): Payer: Medicare HMO | Admitting: Cardiovascular Disease

## 2019-11-23 ENCOUNTER — Encounter: Payer: Self-pay | Admitting: Cardiovascular Disease

## 2019-11-23 VITALS — BP 124/73 | HR 56 | Ht 73.0 in | Wt 162.0 lb

## 2019-11-23 DIAGNOSIS — E7800 Pure hypercholesterolemia, unspecified: Secondary | ICD-10-CM

## 2019-11-23 DIAGNOSIS — I25110 Atherosclerotic heart disease of native coronary artery with unstable angina pectoris: Secondary | ICD-10-CM | POA: Diagnosis not present

## 2019-11-23 DIAGNOSIS — I10 Essential (primary) hypertension: Secondary | ICD-10-CM | POA: Diagnosis not present

## 2019-11-23 DIAGNOSIS — Z953 Presence of xenogenic heart valve: Secondary | ICD-10-CM

## 2019-11-23 DIAGNOSIS — R001 Bradycardia, unspecified: Secondary | ICD-10-CM | POA: Diagnosis not present

## 2019-11-23 NOTE — Telephone Encounter (Signed)
°  Patient Consent for Virtual Visit         [name] [name] has provided verbal consent on 11/23/2019 for a virtual visit (video or telephone).   CONSENT FOR VIRTUAL VISIT FOR:  [name] [name]  By participating in this virtual visit I agree to the following:  I hereby voluntarily request, consent and authorize [facility] and its employed or contracted physicians, physician assistants, nurse practitioners or other licensed health care professionals (the Practitioner), to provide me with telemedicine health care services (the Services") as deemed necessary by the treating Practitioner. I acknowledge and consent to receive the Services by the Practitioner via telemedicine. I understand that the telemedicine visit will involve communicating with the Practitioner through live audiovisual communication technology and the disclosure of certain medical information by electronic transmission. I acknowledge that I have been given the opportunity to request an in-person assessment or other available alternative prior to the telemedicine visit and am voluntarily participating in the telemedicine visit.  I understand that I have the right to withhold or withdraw my consent to the use of telemedicine in the course of my care at any time, without affecting my right to future care or treatment, and that the Practitioner or I may terminate the telemedicine visit at any time. I understand that I have the right to inspect all information obtained and/or recorded in the course of the telemedicine visit and may receive copies of available information for a reasonable fee.  I understand that some of the potential risks of receiving the Services via telemedicine include:   Delay or interruption in medical evaluation due to technological equipment failure or disruption;  Information transmitted may not be sufficient (e.g. poor resolution of images) to allow for appropriate medical decision making by the Practitioner;  and/or   In rare instances, security protocols could fail, causing a breach of personal health information.  Furthermore, I acknowledge that it is my responsibility to provide information about my medical history, conditions and care that is complete and accurate to the best of my ability. I acknowledge that Practitioner's advice, recommendations, and/or decision may be based on factors not within their control, such as incomplete or inaccurate data provided by me or distortions of diagnostic images or specimens that may result from electronic transmissions. I understand that the practice of medicine is not an exact science and that Practitioner makes no warranties or guarantees regarding treatment outcomes. I acknowledge that a copy of this consent can be made available to me via my patient portal ([place]), or I can request a printed copy by calling the office of [facility].    I understand that my insurance will be billed for this visit.   I have read or had this consent read to me.  I understand the contents of this consent, which adequately explains the benefits and risks of the Services being provided via telemedicine.   I have been provided ample opportunity to ask questions regarding this consent and the Services and have had my questions answered to my satisfaction.  I give my informed consent for the services to be provided through the use of telemedicine in my medical care

## 2019-11-23 NOTE — Telephone Encounter (Signed)
RN spoke to patient's wife. Patient not available.  Instruction were given  from today's virtual visit 11/23/19 .  AVS SUMMARY has been mailed .  Recall palaced for 6 month  Visit - ( May 2022)   Patient's wife  verbalized understanding

## 2019-11-23 NOTE — Patient Instructions (Signed)
Medication Instructions:  No changes *If you need a refill on your cardiac medications before your next appointment, please call your pharmacy*   Lab Work: Not needed   Testing/Procedures: Not needed   Follow-Up: At [facility], you and your health needs are our priority.  As part of our continuing mission to provide you with exceptional heart care, we have created designated Provider Care Teams.  These Care Teams include your primary Cardiologist (physician) and Advanced Practice Providers (APPs -  Physician Assistants and Nurse Practitioners) who all work together to provide you with the care you need, when you need it.  We recommend signing up for the patient portal called "MyChart".  Sign up information is provided on this After Visit Summary.  MyChart is used to connect with patients for Virtual Visits (Telemedicine).  Patients are able to view lab/test results, encounter notes, upcoming appointments, etc.  Non-urgent messages can be sent to your provider as well.   To learn more about what you can do with MyChart, go to [email or web address].    Your next appointment:   6 month(s)  The format for your next appointment:   In Person  Provider:   [name], MD

## 2019-11-23 NOTE — Progress Notes (Signed)
Virtual Visit via Telephone Note   This visit type was conducted due to national recommendations for restrictions regarding the COVID-19 Pandemic (e.g. social distancing) in an effort to limit this patient's exposure and mitigate transmission in our community.  Due to his co-morbid illnesses, this patient is at least at moderate risk for complications without adequate follow up.  This format is felt to be most appropriate for this patient at this time.  The patient did not have access to video technology/had technical difficulties with video requiring transitioning to audio format only (telephone).  All issues noted in this document were discussed and addressed.  No physical exam could be performed with this format.  Please refer to the patient's chart for his  consent to telehealth for [facility].   The patient was identified using 2 identifiers.  Date:  11/23/2019   ID:  [name] [name], DOB [birth year], MRN [id number]  Patient Location: Home Provider Location: Office/Clinic  PCP:  [name], MD  Cardiologist:  [name], MD  Electrophysiologist:  None   Evaluation Performed:  Follow-Up Visit  Chief Complaint:  Hypertension, edema  History of Present Illness:    The patient does not have symptoms concerning for COVID-19 infection (fever, chills, cough, or new shortness of breath).   [name] [name] is a 79 y.o. male with hypertension, CAD s/p CABG (LIMA-->LAD, SVG-->RI/OM1, SVG-->PDA), aortic stenosis s/p AVR, bradycardia, and syncope who presents for follow up.  [name] [name] underwent 4 vessel CABG and AVR (25 mm [facility] Ease pericardial tissue valve) on 09/28/14.  His post-operative course was complicated by atrial fibrillation/flutter, but he was discharged in sinus rhythm.  He had an episode of lightheadedness in 08/2015 and was seen in the ED.  He was noted to have acute renal failure and was felt to be dehydrated. He had been working in the yard for several hours  the day before and was not well hydrated.  Metoprolol was switched to carvedilol due to bradycardia and hypertension.  He was also started on amlodipine, so simvastatin was switched to atorvastatin.   [name] [name] has some right lower extremity edema that is chronic.  He had lower extremity Dopplers 03/2018 that were negative for DVT.  His PCP recommended a compression stocking which does seem to help when he wears it. His blood pressure was elevated so amlodipine was increased.  However, at his last appointment [name] [name] had increased lower extremity edema.  His blood pressure is also not well have been controlled.  The increased swelling was thought to be due to the amlodipine.  Therefore his dose was reduced and he was started on spironolactone.  Since then his BP has been much better.  He has no more swelling in his legs and is doing well.  He isn't getting formal exercise but works in his yard a lot.  He has no exertional symptoms but has generalized fatigue.  He denies melena or hematochezia.  He has no LH/dizziness.  Home BPs: 132/75 127/72 122/78 118/69   Past Medical History:  Diagnosis Date  . Arthritis    knee  . Cataracts, bilateral   . Chronic kidney disease    stage 2  . GERD (gastroesophageal reflux disease)   . Glaucoma   . Hearing loss    wears hearing aids  . History of kidney stones    passed stones and 1 time had surgery  . Hyperlipidemia   . Hypertension   . Inguinal hernia 03/2019  .  Pure hypercholesterolemia 03/23/2019  . S/P aortic valve replacement with bioprosthetic valve 03/23/2019   Bioprosthetic 09/2014  . Shingles   . Varicose veins 03/31/2015  . Wears partial dentures    upper    Past Surgical History:  Procedure Laterality Date  . AORTIC VALVE REPLACEMENT N/A 09/28/2014   Procedure: AORTIC VALVE REPLACEMENT (AVR);  Surgeon: [name], MD;  Location: [place];  Service: Open Heart Surgery;  Laterality: N/A;  . CARDIAC CATHETERIZATION N/A 09/23/2014    Procedure: Left Heart Cath and Coronary Angiography;  Surgeon: [name], MD;  Location: [place] [facility];  Service: Cardiovascular;  Laterality: N/A;  . CATARACT EXTRACTION    . CHOLECYSTECTOMY N/A 10/28/2016   Procedure: LAPAROSCOPIC CHOLECYSTECTOMY;  Surgeon: Ralene Ok, MD;  Location: [place];  Service: General;  Laterality: N/A;  . COLONOSCOPY    . CORONARY ARTERY BYPASS GRAFT N/A 09/28/2014   Procedure: CORONARY ARTERY BYPASS GRAFTING (CABG) x4 using left internal mammory artery and right saphenous leg vein harvested endoscopically.;  Surgeon: [name], MD;  Location: [place];  Service: Open Heart Surgery;  Laterality: N/A;  . INGUINAL HERNIA REPAIR Right 04/12/2019   Procedure: LAPAROSCOPIC RIGHT INGUINAL HERNIA REPAIR WITH MESH;  Surgeon: Ralene Ok, MD;  Location: [place];  Service: General;  Laterality: Right;  . kidney stone removal    . PROSTATECTOMY    . TEE WITHOUT CARDIOVERSION N/A 09/28/2014   Procedure: TRANSESOPHAGEAL ECHOCARDIOGRAM (TEE);  Surgeon: [name], MD;  Location: [place];  Service: Open Heart Surgery;  Laterality: N/A;  . TONSILLECTOMY    . WISDOM TOOTH EXTRACTION       Current Outpatient Medications  Medication Sig Dispense Refill  . acetaminophen (TYLENOL) 500 MG tablet Take 1,000 mg by mouth every 6 (six) hours as needed for headache (pain).     Marland Kitchen acyclovir (ZOVIRAX) 800 MG tablet Take 400 mg by mouth in the morning and at bedtime.    Marland Kitchen amLODipine (NORVASC) 5 MG tablet Take 1 tablet (5 mg total) by mouth daily. 90 tablet 1  . aspirin EC 81 MG tablet Take 81 mg by mouth at bedtime. Swallow whole.    Marland Kitchen atorvastatin (LIPITOR) 40 MG tablet TAKE 1 TABLET BY MOUTH AT BEDTIME. 90 tablet 3  . carvedilol (COREG) 6.25 MG tablet TAKE 1 TABLET TWICE DAILY 180 tablet 3  . esomeprazole (NEXIUM) 20 MG capsule Take 20 mg by mouth every other day. Reported on 03/01/2015    . lisinopril (ZESTRIL) 20 MG tablet TAKE 1 TABLET TWICE DAILY 180 tablet 3   . meloxicam (MOBIC) 15 MG tablet Take 15 mg by mouth at bedtime.     . Multiple Vitamins-Minerals (MULTIVITAMIN WITH MINERALS) tablet Take 1 tablet by mouth daily.    Marland Kitchen oxybutynin (DITROPAN) 5 MG tablet Take 5 mg by mouth 2 (two) times daily.     . prednisoLONE acetate (PRED FORTE) 1 % ophthalmic suspension Place 1 drop into the left eye in the morning, at noon, and at bedtime.     Marland Kitchen spironolactone (ALDACTONE) 25 MG tablet Take 1 tablet (25 mg total) by mouth daily. 90 tablet 3  . timolol (TIMOPTIC) 0.5 % ophthalmic solution Place 1 drop into the left eye 2 (two) times daily.     No current facility-administered medications for this visit.    Allergies:   Hydrochlorothiazide    Social History:  The patient  reports that he quit smoking about 39 years ago. His smoking use included  cigarettes. He has never used smokeless tobacco. He reports current alcohol use. He reports that he does not use drugs.   Family History:  The patient's family history includes Ovarian cancer in his mother.    ROS:  Please see the history of present illness.   Otherwise, review of systems are positive for none.   All other systems are reviewed and negative.    PHYSICAL EXAM: BP 124/73   Pulse (!) 56   Ht 6\' 1"  (1.854 m)   Wt 162 lb (73.5 kg)   BMI 21.37 kg/m  GENERAL: Sounds well.  No acute distress. RESP: Respirations unlabored NEURO:  Speech fluent.  Cranial nerves grossly intact.  Moves all 4 extremities freely PSYCH:  Cognitively intact, oriented to person place and time  Recent Labs: 04/07/2019: Hemoglobin 15.5; Platelets 195 10/05/2019: BUN 23; Creatinine, Ser 1.20; Potassium 4.8; Sodium 138   11/25/2017: Total cholesterol 114, triglycerides 91, HDL 40, LDL 55 Potassium 4.9, creatinine 0.9  Lipid Panel No results found for: CHOL, TRIG, HDL, CHOLHDL, VLDL, LDLCALC, LDLDIRECT    Wt Readings from Last 3 Encounters:  11/23/19 162 lb (73.5 kg)  09/28/19 171 lb 6.4 oz (77.7 kg)  06/23/19 167 lb  (75.8 kg)    EKG 03/21/17: Sinus rhythm.  Rate 60 bpm.  Left axis deviation.  03/23/18: Sinus bradycardia.  Rate 54 bpm.  First degree AV block.  LAFB.   03/23/19: Sinus rhythm.  Rate 60 bpm.  Left axis deviation.  Prior anteroseptal infarct.  IVCD.  Echo 11/01/14: Study Conclusions  - Left ventricle: The cavity size was normal. Wall thickness was increased in a pattern of moderate LVH. Systolic function was normal. The estimated ejection fraction was in the range of 55% to 60%. Wall motion was normal; there were no regional wall motion abnormalities. - Aortic valve: Normal appearing tissue AVR with no peri valvular regurgitation. - Mitral valve: There was mild regurgitation. - Left atrium: The atrium was mildly dilated. - Atrial septum: No defect or patent foramen ovale was identified.  ASSESSMENT AND PLAN:  # Hypertensive heart disease:  BP is much better controlled.  His swelling has improved as well since we reduce the dose of amlodipine.  Continue amlodipine, carvedilol, lisinopril, and spironolactone.  # Hyperlipidemia:   Continue atorvastatin.  LDL was 55 on 06/2019.  # CAD: S/p CABG and doing well.  No angina.  Continue aspirin, carvedilol and atorvastatin.  He was again encouraged to get back into his exercise routine.  # s/p AVR: [name] [name] has a bioprosthetic AVR and is doing well.  He needs antibiotic prophylaxis prior to dental procedures.  # LE Edema: No DVT on lower extremity Dopplers 03/2018.  Improved since reducing amlodipine and adding spironolactone  Current medicines are reviewed at length with the patient today.  The patient does not have concerns regarding medicines.  The following changes have been made:  none  Labs/ tests ordered today include:   No orders of the defined types were placed in this encounter.  COVID-19 Education: The signs and symptoms of COVID-19 were discussed with the patient and how to seek care for testing (follow up with PCP or  arrange E-visit).  The importance of social distancing was discussed today.  Time:   Today, I have spent 17 minutes with the patient with telehealth technology discussing the above problems.    Disposition:   FU with Kyna Blahnik C. Oval Linsey, MD in 6 months.  Signed, [name], MD  11/23/2019 9:18 AM  [facility]

## 2019-12-27 DIAGNOSIS — N182 Chronic kidney disease, stage 2 (mild): Secondary | ICD-10-CM | POA: Diagnosis not present

## 2019-12-27 DIAGNOSIS — I1310 Hypertensive heart and chronic kidney disease without heart failure, with stage 1 through stage 4 chronic kidney disease, or unspecified chronic kidney disease: Secondary | ICD-10-CM | POA: Diagnosis not present

## 2019-12-27 DIAGNOSIS — I2510 Atherosclerotic heart disease of native coronary artery without angina pectoris: Secondary | ICD-10-CM | POA: Diagnosis not present

## 2019-12-27 DIAGNOSIS — E7800 Pure hypercholesterolemia, unspecified: Secondary | ICD-10-CM | POA: Diagnosis not present

## 2019-12-29 DIAGNOSIS — Z947 Corneal transplant status: Secondary | ICD-10-CM | POA: Diagnosis not present

## 2019-12-29 DIAGNOSIS — H5712 Ocular pain, left eye: Secondary | ICD-10-CM | POA: Diagnosis not present

## 2019-12-29 DIAGNOSIS — Z961 Presence of intraocular lens: Secondary | ICD-10-CM | POA: Diagnosis not present

## 2019-12-29 DIAGNOSIS — H4052X3 Glaucoma secondary to other eye disorders, left eye, severe stage: Secondary | ICD-10-CM | POA: Diagnosis not present

## 2019-12-29 DIAGNOSIS — H16122 Filamentary keratitis, left eye: Secondary | ICD-10-CM | POA: Diagnosis not present

## 2020-02-16 ENCOUNTER — Telehealth: Payer: Self-pay | Admitting: Cardiovascular Disease

## 2020-02-16 NOTE — Telephone Encounter (Signed)
Pt c/o Syncope: STAT if syncope occurred within 30 minutes and pt complains of lightheadedness High Priority if episode of passing out, completely, today or in last 24 hours   1. Did you pass out today? No  2. When is the last time you passed out? Yesterday  3. Has this occurred multiple times? Yes   4. Did you have any symptoms prior to passing out? Lightheaded and quezzy, heavy sweating

## 2020-02-16 NOTE — Telephone Encounter (Signed)
Agree with plan.  Please hold spironolactone.  Check BP in am and only take BP meds if BP is >130/80.  Track BP for the next week and APP follow up to review.

## 2020-02-16 NOTE — Telephone Encounter (Signed)
Spoke with wife and gave recommendations.  Scheduled follow up next week but she will keep telephone visit tomorrow in case readings remain low and feels she needs to speak with Dr Oval Linsey tomorrow, she is concerned with low readings.

## 2020-02-16 NOTE — Telephone Encounter (Signed)
Spoke with the patient and his wife.  Pt wife sts that the patient had an episode of pre-syncope/syncope yesterday around 4pm. Pt reports feeling lightheaded and diaphoretic prior  to the event.  He attempted to make his way to the bed. He believes he lost consciousness briefly awoke on the floor.  He denies palpitation, chest pain, sob. He scarped his nose on the carpet he denies any other injury. He wife was home with him at the time. She was able to check his vital signs shortly after 90/55 55bpm. 30 min later 86/45 56bpm. 11pm 106/68 64bpm. Patient has spent most of the day outside moving wood from the yard to the house. He did not eat or drink much.  Patients BP this morning  7:30am 118/70 (medications taken right before).  9am 124/69. 4pm 99/53 63 bpm 4:20pm 108/57  Patient is currently asymptomatic and has not had any reoccurring symptoms today.  Telephone appt scheduled with Dr. Oval Linsey tomorrow 1/27 @ 9:20 am. Verbal consent previously on file.  Adv the pt wife to have the pt weight and vs ready 20 min to the appt. Adv the patient to hydrate this afternoon. Adv them to contact EMS if syncope reoccurs.  Patient and his wife verbalized understanding and voiced appreciation for the call back.

## 2020-02-17 ENCOUNTER — Telehealth (INDEPENDENT_AMBULATORY_CARE_PROVIDER_SITE_OTHER): Payer: Medicare HMO | Admitting: Cardiovascular Disease

## 2020-02-17 ENCOUNTER — Encounter: Payer: Self-pay | Admitting: Cardiovascular Disease

## 2020-02-17 VITALS — BP 124/64 | HR 59 | Ht 73.0 in | Wt 160.0 lb

## 2020-02-17 DIAGNOSIS — I25110 Atherosclerotic heart disease of native coronary artery with unstable angina pectoris: Secondary | ICD-10-CM | POA: Diagnosis not present

## 2020-02-17 DIAGNOSIS — R001 Bradycardia, unspecified: Secondary | ICD-10-CM | POA: Diagnosis not present

## 2020-02-17 DIAGNOSIS — I10 Essential (primary) hypertension: Secondary | ICD-10-CM | POA: Diagnosis not present

## 2020-02-17 DIAGNOSIS — R55 Syncope and collapse: Secondary | ICD-10-CM

## 2020-02-17 DIAGNOSIS — Z951 Presence of aortocoronary bypass graft: Secondary | ICD-10-CM | POA: Diagnosis not present

## 2020-02-17 MED ORDER — SPIRONOLACTONE 25 MG PO TABS: 25.0000 mg | ORAL_TABLET | Freq: Every day | ORAL | 3 refills | Status: DC

## 2020-02-17 NOTE — Patient Instructions (Signed)
Medication Instructions:  RESTART spironolactone 25 mg daily STOP amlodipine  --Please check your blood pressure twice daily at home, write it down.  Bring blood pressure log to your next appointment on 2/4.    *If you need a refill on your cardiac medications before your next appointment, please call your pharmacy*  Follow-Up: At [facility], you and your health needs are our priority.  As part of our continuing mission to provide you with exceptional heart care, we have created designated Provider Care Teams.  These Care Teams include your primary Cardiologist (physician) and Advanced Practice Providers (APPs -  Physician Assistants and Nurse Practitioners) who all work together to provide you with the care you need, when you need it.  We recommend signing up for the patient portal called "MyChart".  Sign up information is provided on this After Visit Summary.  MyChart is used to connect with patients for Virtual Visits (Telemedicine).  Patients are able to view lab/test results, encounter notes, upcoming appointments, etc.  Non-urgent messages can be sent to your provider as well.   To learn more about what you can do with MyChart, go to [email or web address].    Your next appointment:   2/4 as scheduled with [name], DNP

## 2020-02-17 NOTE — Progress Notes (Signed)
Virtual Visit via Telephone Note   This visit type was conducted due to national recommendations for restrictions regarding the COVID-19 Pandemic (e.g. social distancing) in an effort to limit this patient's exposure and mitigate transmission in our community.  Due to his co-morbid illnesses, this patient is at least at moderate risk for complications without adequate follow up.  This format is felt to be most appropriate for this patient at this time.  The patient did not have access to video technology/had technical difficulties with video requiring transitioning to audio format only (telephone).  All issues noted in this document were discussed and addressed.  No physical exam could be performed with this format.  Please refer to the patient's chart for his  consent to telehealth for [facility].   The patient was identified using 2 identifiers.  Date:  02/17/2020   ID:  [name] [name], DOB 31-Jan-1940, MRN [id number]  Patient Location: Home Provider Location: Office/Clinic  PCP:  [name], MD  Cardiologist:  [name], MD  Electrophysiologist:  None   Evaluation Performed:  Follow-Up Visit  Chief Complaint:  Hypertension, edema  History of Present Illness:    The patient does not have symptoms concerning for COVID-19 infection (fever, chills, cough, or new shortness of breath).   [name] [name] is a 80 y.o. male with hypertension, CAD s/p CABG (LIMA-->LAD, SVG-->RI/OM1, SVG-->PDA), aortic stenosis s/p AVR, bradycardia, and syncope who presents for follow up.  [name] [name] underwent 4 vessel CABG and AVR (25 mm [facility] Ease pericardial tissue valve) on 09/28/14.  His post-operative course was complicated by atrial fibrillation/flutter, but he was discharged in sinus rhythm.  He had an episode of lightheadedness in 08/2015 and was seen in the ED.  He was noted to have acute renal failure and was felt to be dehydrated. He had been working in the yard for several hours  the day before and was not well hydrated.  Metoprolol was switched to carvedilol due to bradycardia and hypertension.  He was also started on amlodipine, so simvastatin was switched to atorvastatin.   [name] [name] has some right lower extremity edema that is chronic.  He had lower extremity Dopplers 03/2018 that were negative for DVT.  His PCP recommended a compression stocking which does seem to help when he wears it. His blood pressure was elevated so amlodipine was increased.  However, [name] [name] had increased lower extremity edema.  Therefore his dose was reduced and he was started on spironolactone.  Since then his BP has been much better.  Yesterday he had an episode of syncope.  His blood pressure dropped as low as 86/45.  He had been working outside vigorously and had not had much to eat or drink.  Recommended that he hold his spironolactone and check his blood pressure.  He recalls feeling nauseous.  He got up to go to the bathroom and had to sit down because he thought he would pass out.  He sat down on his bed and when stood back out he passed out.  There was no chest pain or shortness of breath.  He broke out in a cold sweat.    Past Medical History:  Diagnosis Date  . Arthritis    knee  . Cataracts, bilateral   . Chronic kidney disease    stage 2  . GERD (gastroesophageal reflux disease)   . Glaucoma   . Hearing loss    wears hearing aids  . History of kidney stones  passed stones and 1 time had surgery  . Hyperlipidemia   . Hypertension   . Inguinal hernia 03/2019  . Pure hypercholesterolemia 03/23/2019  . S/P aortic valve replacement with bioprosthetic valve 03/23/2019   Bioprosthetic 09/2014  . Shingles   . Varicose veins 03/31/2015  . Wears partial dentures    upper    Past Surgical History:  Procedure Laterality Date  . AORTIC VALVE REPLACEMENT N/A 09/28/2014   Procedure: AORTIC VALVE REPLACEMENT (AVR);  Surgeon: [name], MD;  Location: [place];  Service: Open Heart  Surgery;  Laterality: N/A;  . CARDIAC CATHETERIZATION N/A 09/23/2014   Procedure: Left Heart Cath and Coronary Angiography;  Surgeon: [name], MD;  Location: [place] [facility];  Service: Cardiovascular;  Laterality: N/A;  . CATARACT EXTRACTION    . CHOLECYSTECTOMY N/A 10/28/2016   Procedure: LAPAROSCOPIC CHOLECYSTECTOMY;  Surgeon: Ralene Ok, MD;  Location: [place];  Service: General;  Laterality: N/A;  . COLONOSCOPY    . CORONARY ARTERY BYPASS GRAFT N/A 09/28/2014   Procedure: CORONARY ARTERY BYPASS GRAFTING (CABG) x4 using left internal mammory artery and right saphenous leg vein harvested endoscopically.;  Surgeon: [name], MD;  Location: [place];  Service: Open Heart Surgery;  Laterality: N/A;  . INGUINAL HERNIA REPAIR Right 04/12/2019   Procedure: LAPAROSCOPIC RIGHT INGUINAL HERNIA REPAIR WITH MESH;  Surgeon: Ralene Ok, MD;  Location: [place];  Service: General;  Laterality: Right;  . kidney stone removal    . PROSTATECTOMY    . TEE WITHOUT CARDIOVERSION N/A 09/28/2014   Procedure: TRANSESOPHAGEAL ECHOCARDIOGRAM (TEE);  Surgeon: [name], MD;  Location: [place];  Service: Open Heart Surgery;  Laterality: N/A;  . TONSILLECTOMY    . WISDOM TOOTH EXTRACTION       Current Outpatient Medications  Medication Sig Dispense Refill  . acetaminophen (TYLENOL) 500 MG tablet Take 1,000 mg by mouth every 6 (six) hours as needed for headache (pain).     Marland Kitchen acyclovir (ZOVIRAX) 800 MG tablet Take 400 mg by mouth in the morning and at bedtime.    Marland Kitchen aspirin EC 81 MG tablet Take 81 mg by mouth at bedtime. Swallow whole.    Marland Kitchen atorvastatin (LIPITOR) 40 MG tablet TAKE 1 TABLET BY MOUTH AT BEDTIME. 90 tablet 3  . carvedilol (COREG) 6.25 MG tablet TAKE 1 TABLET TWICE DAILY 180 tablet 3  . esomeprazole (NEXIUM) 20 MG capsule Take 20 mg by mouth every other day. Reported on 03/01/2015    . lisinopril (ZESTRIL) 20 MG tablet TAKE 1 TABLET TWICE DAILY 180 tablet 3  . meloxicam (MOBIC)  15 MG tablet Take 15 mg by mouth at bedtime.     . Multiple Vitamins-Minerals (MULTIVITAMIN WITH MINERALS) tablet Take 1 tablet by mouth daily.    Marland Kitchen oxybutynin (DITROPAN) 5 MG tablet Take 5 mg by mouth 2 (two) times daily.     . prednisoLONE acetate (PRED FORTE) 1 % ophthalmic suspension Place 1 drop into the left eye in the morning, at noon, and at bedtime.     Marland Kitchen spironolactone (ALDACTONE) 25 MG tablet Take 1 tablet (25 mg total) by mouth daily. 90 tablet 3  . timolol (TIMOPTIC) 0.5 % ophthalmic solution Place 1 drop into the left eye 2 (two) times daily.     No current facility-administered medications for this visit.    Allergies:   Hydrochlorothiazide    Social History:  The patient  reports that he quit smoking about 39 years ago. His  smoking use included cigarettes. He has never used smokeless tobacco. He reports current alcohol use. He reports that he does not use drugs.   Family History:  The patient's family history includes Ovarian cancer in his mother.    ROS:  Please see the history of present illness.   Otherwise, review of systems are positive for none.   All other systems are reviewed and negative.    PHYSICAL EXAM: BP 124/64 Comment: at 830 am-no BP meds  Pulse (!) 59   Ht 6\' 1"  (1.854 m)   Wt 160 lb (72.6 kg)   BMI 21.11 kg/m  GENERAL: Sounds well.  No acute distress. RESP: Respirations unlabored NEURO:  Speech fluent.  Cranial nerves grossly intact.  Moves all 4 extremities freely PSYCH:  Cognitively intact, oriented to person place and time  Recent Labs: 04/07/2019: Hemoglobin 15.5; Platelets 195 10/05/2019: BUN 23; Creatinine, Ser 1.20; Potassium 4.8; Sodium 138   11/25/2017: Total cholesterol 114, triglycerides 91, HDL 40, LDL 55 Potassium 4.9, creatinine 0.9  Lipid Panel No results found for: CHOL, TRIG, HDL, CHOLHDL, VLDL, LDLCALC, LDLDIRECT    Wt Readings from Last 3 Encounters:  02/17/20 160 lb (72.6 kg)  11/23/19 162 lb (73.5 kg)  09/28/19 171 lb  6.4 oz (77.7 kg)    EKG 03/21/17: Sinus rhythm.  Rate 60 bpm.  Left axis deviation.  03/23/18: Sinus bradycardia.  Rate 54 bpm.  First degree AV block.  LAFB.   03/23/19: Sinus rhythm.  Rate 60 bpm.  Left axis deviation.  Prior anteroseptal infarct.  IVCD.  Echo 11/01/14: Study Conclusions  - Left ventricle: The cavity size was normal. Wall thickness was increased in a pattern of moderate LVH. Systolic function was normal. The estimated ejection fraction was in the range of 55% to 60%. Wall motion was normal; there were no regional wall motion abnormalities. - Aortic valve: Normal appearing tissue AVR with no peri valvular regurgitation. - Mitral valve: There was mild regurgitation. - Left atrium: The atrium was mildly dilated. - Atrial septum: No defect or patent foramen ovale was identified.  ASSESSMENT AND PLAN:  # Syncope: # Hypertensive heart disease:  The episode sounds orthostatic in the setting of not eating and drinking well and vigorous activity.  He is also losing weight.  He liked the spironolactone because it helps his knee did not swell as much.  We'll continue spironolactone 25 mg.  He will stop the amlodipine instead.  Continue carvedilol and lisinopril.  He will check his blood pressure twice daily and bring to follow-up next week.  # Hyperlipidemia:   Continue atorvastatin.  LDL was 55 on 06/2019.  # CAD: S/p CABG and doing well.  No angina.  Continue aspirin, carvedilol and atorvastatin.  He was again encouraged to get back into his exercise routine.  # s/p AVR: Mr. Dirksen has a bioprosthetic AVR and is doing well.  He has no chest pain, shortness of breath, or edema.  He needs antibiotic prophylaxis prior to dental procedures.  # LE Edema: No DVT on lower extremity Dopplers 03/2018.  Improved since reducing amlodipine and adding spironolactone  Current medicines are reviewed at length with the patient today.  The patient does not have concerns regarding  medicines.  The following changes have been made:  none  Labs/ tests ordered today include:   No orders of the defined types were placed in this encounter.  COVID-19 Education: The signs and symptoms of COVID-19 were discussed with the patient and how to  seek care for testing (follow up with PCP or arrange E-visit).  The importance of social distancing was discussed today.  Time:   Today, I have spent 22 minutes with the patient with telehealth technology discussing the above problems.    Disposition:   FU with Medardo Hassing C. Oval Linsey, MD in 6 months.  Signed, [name], MD  02/17/2020 9:53 AM    [place]

## 2020-02-18 NOTE — Progress Notes (Signed)
Cardiology Office Note   Date:  02/25/2020   ID:  [name] [name], [name] [name] Jan 17, 1941, MRN [id number]  PCP:  [name] Neer, MD  Cardiologist: [name] [name] [id number]    History of Present Illness: [name] [name] is a 80 y.o. male who presents for ongoing assessment and management of coronary artery disease status post CABG (LIMA to LAD, SVG to RI/OM1, SVG to PDA;, aortic stenosis status post AVR,  (25 mm [facility] Ease pericardial tissue valve) on 09/28/14.  bradycardia with syncope, and hypertension.  The patient also has a history of right lower extremity edema which is chronic.  He did have lower extremity Dopplers completed on 04/10/2018 that were negative for DVT.  He does have a compression stocking which helps with symptoms.  His amlodipine was temporarily increased but it caused worsening lower extremity edema and therefore the dosage was decreased back to original dosing.  He was then started on spironolactone.  His last encounter with [name] [name] was via virtual visit on 02/17/2020 after having had a syncopal episode.  His blood pressure dropped to as low as 86/45.  He had been working outside and had not had very much to eat and drink.  He did go inside, go to the bathroom, and felt as if he was going to pass out so sat down.  He then got up and sat down on his bed and when he stood back up he did pass out.  He broke out in a cold sweat but there was no chest pain or associated shortness of breath  [name] [name] felt that this was related to orthostasis in the setting of not eating and drinking with vigorous activity.  The patient states that he liked the spironolactone because it helps his knee not swell as much and therefore this was continued however amlodipine was discontinued.  He was to continue carvedilol and lisinopril.  He is to keep track of his blood pressure and bring his recordings with him to next office visit today.  Today he complains of lightheadedness with position  change.  He did bring with him a copy of his medic occasions as well as a list of his blood pressures from 02/15/2020 to present.  Blood pressures been running at lowest 90/55 at highest 135/66.  Blood pressure has improved over time with blood pressures fluctuating but fluctuating towards the higher level compared to his lowest on 02/15/2020.  Amlodipine was discontinued by [name] [name].  I have ordered orthostatics on this gentleman here in the office to evaluate further.  He was not found to be orthostatic as blood pressure remained stable throughout position change lowest blood pressure 130/82 lying highest blood pressure 142/79 after standing 3 minutes.  There was no significant change in heart rate as it averaged about 66 bpm.  He states that he is eating and drinking okay and not missing any meals.  He states that he does notice it mostly when he moves his head or bends over.  He does wear hearing aids.  Past Medical History:  Diagnosis Date  . Arthritis    knee  . Cataracts, bilateral   . Chronic kidney disease    stage 2  . GERD (gastroesophageal reflux disease)   . Glaucoma   . Hearing loss    wears hearing aids  . History of kidney stones    passed stones and 1 time had surgery  . Hyperlipidemia   . Hypertension   . Inguinal hernia 03/2019  . Pure  hypercholesterolemia 03/23/2019  . S/P aortic valve replacement with bioprosthetic valve 03/23/2019   Bioprosthetic 09/2014  . Shingles   . Varicose veins 03/31/2015  . Wears partial dentures    upper    Past Surgical History:  Procedure Laterality Date  . AORTIC VALVE REPLACEMENT N/A 09/28/2014   Procedure: AORTIC VALVE REPLACEMENT (AVR);  Surgeon: [name] Nakayama, MD;  Location: [place];  Service: Open Heart Surgery;  Laterality: N/A;  . CARDIAC CATHETERIZATION N/A 09/23/2014   Procedure: Left Heart Cath and Coronary Angiography;  Surgeon: [name], MD;  Location: [place] [facility];  Service: Cardiovascular;  Laterality: N/A;   . CATARACT EXTRACTION    . CHOLECYSTECTOMY N/A 10/28/2016   Procedure: LAPAROSCOPIC CHOLECYSTECTOMY;  Surgeon: Ralene Ok, MD;  Location: [place];  Service: General;  Laterality: N/A;  . COLONOSCOPY    . CORONARY ARTERY BYPASS GRAFT N/A 09/28/2014   Procedure: CORONARY ARTERY BYPASS GRAFTING (CABG) x4 using left internal mammory artery and right saphenous leg vein harvested endoscopically.;  Surgeon: [name] Nakayama, MD;  Location: [place];  Service: Open Heart Surgery;  Laterality: N/A;  . INGUINAL HERNIA REPAIR Right 04/12/2019   Procedure: LAPAROSCOPIC RIGHT INGUINAL HERNIA REPAIR WITH MESH;  Surgeon: Ralene Ok, MD;  Location: [place];  Service: General;  Laterality: Right;  . kidney stone removal    . PROSTATECTOMY    . TEE WITHOUT CARDIOVERSION N/A 09/28/2014   Procedure: TRANSESOPHAGEAL ECHOCARDIOGRAM (TEE);  Surgeon: [name] Nakayama, MD;  Location: [place];  Service: Open Heart Surgery;  Laterality: N/A;  . TONSILLECTOMY    . WISDOM TOOTH EXTRACTION       Current Outpatient Medications  Medication Sig Dispense Refill  . acetaminophen (TYLENOL) 500 MG tablet Take 1,000 mg by mouth every 6 (six) hours as needed for headache (pain).     Marland Kitchen acyclovir (ZOVIRAX) 800 MG tablet Take 400 mg by mouth in the morning and at bedtime.    Marland Kitchen aspirin EC 81 MG tablet Take 81 mg by mouth at bedtime. Swallow whole.    Marland Kitchen atorvastatin (LIPITOR) 40 MG tablet TAKE 1 TABLET BY MOUTH AT BEDTIME. 90 tablet 3  . carvedilol (COREG) 6.25 MG tablet TAKE 1 TABLET TWICE DAILY 180 tablet 3  . esomeprazole (NEXIUM) 20 MG capsule Take 20 mg by mouth every other day. Reported on 03/01/2015    . lisinopril (ZESTRIL) 20 MG tablet TAKE 1 TABLET TWICE DAILY 180 tablet 3  . meloxicam (MOBIC) 15 MG tablet Take 15 mg by mouth at bedtime.     . Multiple Vitamins-Minerals (MULTIVITAMIN WITH MINERALS) tablet Take 1 tablet by mouth daily.    Marland Kitchen oxybutynin (DITROPAN) 5 MG tablet Take 5 mg by mouth 2 (two) times daily.      . prednisoLONE acetate (PRED FORTE) 1 % ophthalmic suspension Place 1 drop into the left eye in the morning, at noon, and at bedtime.     Marland Kitchen spironolactone (ALDACTONE) 25 MG tablet Take 1 tablet (25 mg total) by mouth daily. 90 tablet 3  . timolol (TIMOPTIC) 0.5 % ophthalmic solution Place 1 drop into the left eye 2 (two) times daily.     No current facility-administered medications for this visit.    Allergies:   Hydrochlorothiazide    Social History:  The patient  reports that he quit smoking about 39 years ago. His smoking use included cigarettes. He has never used smokeless tobacco. He reports current alcohol use. He reports that he does not use drugs.  Family History:  The patient's family history includes Ovarian cancer in his mother.    ROS: All other systems are reviewed and negative. Unless otherwise mentioned in H&P    PHYSICAL EXAM: VS:  BP (!) 142/98   Pulse 61   Ht 6\' 1"  (1.854 m)   Wt 164 lb (74.4 kg)   SpO2 96%   BMI 21.64 kg/m  , BMI Body mass index is 21.64 kg/m. GEN: Well nourished, well developed, in no acute distress HEENT: normal Neck: no JVD, carotid bruits, or masses Cardiac: RRR; 1/6 systolic murmurs, rubs, or gallops,no edema  Respiratory:  Clear to auscultation bilaterally, normal work of breathing GI: soft, nontender, nondistended, + BS MS: no deformity or atrophy Skin: warm and dry, no rash Neuro:  Strength and sensation are intact Psych: euthymic mood, full affect   EKG: Sinus rhythm with first-degree AV block and occasional PVCs.  Personally reviewed.  Compared to prior EKG PVCs were not found.  Heart rate 61 bpm.  Recent Labs: 04/07/2019: Hemoglobin 15.5; Platelets 195 10/05/2019: BUN 23; Creatinine, Ser 1.20; Potassium 4.8; Sodium 138    Lipid Panel No results found for: CHOL, TRIG, HDL, CHOLHDL, VLDL, LDLCALC, LDLDIRECT    Wt Readings from Last 3 Encounters:  02/25/20 164 lb (74.4 kg)  02/17/20 160 lb (72.6 kg)  11/23/19 162 lb  (73.5 kg)      Other studies Reviewed: Echo 11/01/14: Study Conclusions  - Left ventricle: The cavity size was normal. Wall thickness was increased in a pattern of moderate LVH. Systolic function was normal. The estimated ejection fraction was in the range of 55% to 60%. Wall motion was normal; there were no regional wall motion abnormalities. - Aortic valve: Normal appearing tissue AVR with no peri valvular regurgitation. - Mitral valve: There was mild regurgitation. - Left atrium: The atrium was mildly dilated. - Atrial septum: No defect or patent foramen ovale was identified.    ASSESSMENT AND PLAN:  1.  Lightheadedness: These are positional per patient report.  I did do orthostatics and he was negative for orthostatic hypotension.  I have advised him to have his ears checked to make sure he does not have a cerumen impaction or other inner ear issues as he is wearing hearing aids which can exacerbate impaction.  He is to continue to be hydrated and to watch getting up quickly so that he does not have any issues with falls.  He verbalizes understanding.   He is on oxybutynin which may be contributing.to his is symptoms at this time.He is not orthostatic, and therefore no medication changes are made at this time.   2.  Coronary artery disease: He offers no complaints of chest discomfort dyspnea on exertion or fatigue.  EKG is not concerning even though he did have a PVC noted.  I did express to him if he is having a lot of palpitations or heart skipping to report this to [place].  I have reviewed his most recent labs electrolytes were all within normal limits.  3.  Hypertension: He continues on spironolactone, and lisinopril along with carvedilol.  As stated above he was not found to be orthostatic.  Blood pressure appears to have stabilized with elimination of amlodipine.  No further testing is planned at this time.  Current medicines are reviewed at length with the patient  today.  I have spent 25 minutes dedicated to the care of this patient on the date of this encounter to include pre-visit review of records, assessment, management  and diagnostic testing,with shared decision making.  Labs/ tests ordered today include: None [name] [name], ANP, AACC   02/25/2020 9:40 AM    [facility] [place] [address] Office [phone] Fax [phone]  Notice: This dictation was prepared with Dragon dictation along with smaller phrase technology. Any transcriptional errors that result from this process are unintentional and may not be corrected upon review.

## 2020-02-25 ENCOUNTER — Encounter: Payer: Self-pay | Admitting: Adult Health

## 2020-02-25 ENCOUNTER — Ambulatory Visit: Payer: Medicare HMO | Admitting: Adult Health

## 2020-02-25 ENCOUNTER — Other Ambulatory Visit: Payer: Self-pay

## 2020-02-25 VITALS — BP 142/98 | HR 61 | Ht 73.0 in | Wt 164.0 lb

## 2020-02-25 DIAGNOSIS — I10 Essential (primary) hypertension: Secondary | ICD-10-CM

## 2020-02-25 DIAGNOSIS — R42 Dizziness and giddiness: Secondary | ICD-10-CM

## 2020-02-25 DIAGNOSIS — E7800 Pure hypercholesterolemia, unspecified: Secondary | ICD-10-CM

## 2020-02-25 DIAGNOSIS — I25110 Atherosclerotic heart disease of native coronary artery with unstable angina pectoris: Secondary | ICD-10-CM

## 2020-02-25 NOTE — Patient Instructions (Signed)

## 2020-03-20 ENCOUNTER — Other Ambulatory Visit: Payer: Self-pay | Admitting: Cardiovascular Disease

## 2020-03-22 DIAGNOSIS — H903 Sensorineural hearing loss, bilateral: Secondary | ICD-10-CM | POA: Diagnosis not present

## 2020-04-05 ENCOUNTER — Encounter: Payer: Self-pay | Admitting: Cardiovascular Disease

## 2020-04-05 ENCOUNTER — Other Ambulatory Visit: Payer: Self-pay

## 2020-04-05 ENCOUNTER — Ambulatory Visit: Payer: Medicare HMO | Admitting: Cardiovascular Disease

## 2020-04-05 VITALS — BP 132/76 | HR 60 | Ht 73.0 in | Wt 165.4 lb

## 2020-04-05 DIAGNOSIS — Z953 Presence of xenogenic heart valve: Secondary | ICD-10-CM | POA: Diagnosis not present

## 2020-04-05 DIAGNOSIS — I25110 Atherosclerotic heart disease of native coronary artery with unstable angina pectoris: Secondary | ICD-10-CM | POA: Diagnosis not present

## 2020-04-05 DIAGNOSIS — I10 Essential (primary) hypertension: Secondary | ICD-10-CM

## 2020-04-05 DIAGNOSIS — E7800 Pure hypercholesterolemia, unspecified: Secondary | ICD-10-CM | POA: Diagnosis not present

## 2020-04-05 DIAGNOSIS — R0600 Dyspnea, unspecified: Secondary | ICD-10-CM | POA: Diagnosis not present

## 2020-04-05 DIAGNOSIS — R5383 Other fatigue: Secondary | ICD-10-CM

## 2020-04-05 DIAGNOSIS — R0609 Other forms of dyspnea: Secondary | ICD-10-CM

## 2020-04-05 DIAGNOSIS — Z951 Presence of aortocoronary bypass graft: Secondary | ICD-10-CM

## 2020-04-05 DIAGNOSIS — R001 Bradycardia, unspecified: Secondary | ICD-10-CM | POA: Diagnosis not present

## 2020-04-05 HISTORY — DX: Other fatigue: R53.83

## 2020-04-05 NOTE — Progress Notes (Signed)
Cardiology office note   Date:  04/05/2020   ID:  [name] [name], DOB [birth year], MRN [id number]   PCP:  [name], MD  Cardiologist:  [name], MD  Electrophysiologist:  None   Evaluation Performed:  Follow-Up Visit  Chief Complaint:  Hypertension, edema  History of Present Illness:     [name] [name] is a 80 y.o. male with hypertension, CAD s/p CABG (LIMA-->LAD, SVG-->RI/OM1, SVG-->PDA), aortic stenosis s/p AVR, bradycardia, and syncope who presents for follow up.  [name] [name] underwent 4 vessel CABG and AVR (25 mm [facility] Ease pericardial tissue valve) on 09/28/14.  His post-operative course was complicated by atrial fibrillation/flutter, but he was discharged in sinus rhythm.  He had an episode of lightheadedness in 08/2015 and was seen in the ED.  He was noted to have acute renal failure and was felt to be dehydrated. He had been working in the yard for several hours the day before and was not well hydrated.  Metoprolol was switched to carvedilol due to bradycardia and hypertension.  He was also started on amlodipine, so simvastatin was switched to atorvastatin.   [name] [name] has some right lower extremity edema that is chronic.  He had lower extremity Dopplers 03/2018 that were negative for DVT.  His PCP recommended a compression stocking which does seem to help when he wears it. His blood pressure was elevated so amlodipine was increased.  However, [name] [name] had increased lower extremity edema.  Therefore his dose was reduced and he was started on spironolactone.  Since then his BP has been much better.  Yesterday he had an episode of syncope.  His blood pressure dropped as low as 86/45.  He had been working outside vigorously and had not had much to eat or drink.  Recommended that he hold his spironolactone and check his blood pressure.  He recalls feeling nauseous.  He got up to go to the bathroom and had to sit down because he thought he would pass out.  He sat down on his  bed and when stood back out he passed out.  There was no chest pain or shortness of breath.  He broke out in a cold sweat.    At his last appointment [name] [name] had episodes of hypotension and syncope.  This occurred in the setting of not eating and drinking well and weight loss.  His symptoms were thought to be orthostatic.  Amlodipine was discontinued.  He followed up with [name] Sims, DNP 02/2020 and was not orthostatic in the office though he did continue to report positional lightheadedness.  His BP continues to be labile.  He has had some exercises he states history of diabetes lately as the 73s.  This typically occurs after he has been exerting himself outside.  He has been trying to eat and drink more.  There is no associated chest pain but he notes that his energy levels are sometimes not as good.  He has trouble with physical activity that he did not have in the past.  He gets tired trying to pull the chain on his chains saw.   His wife notes that he has diaphoresis during these episodes.  He denies any lower extremity edema, orthopnea, or PND.    Past Medical History:  Diagnosis Date  . Arthritis    knee  . Cataracts, bilateral   . Chronic kidney disease    stage 2  . Fatigue 04/05/2020  . GERD (gastroesophageal reflux disease)   .  Glaucoma   . Hearing loss    wears hearing aids  . History of kidney stones    passed stones and 1 time had surgery  . Hyperlipidemia   . Hypertension   . Inguinal hernia 03/2019  . Pure hypercholesterolemia 03/23/2019  . S/P aortic valve replacement with bioprosthetic valve 03/23/2019   Bioprosthetic 09/2014  . Shingles   . Varicose veins 03/31/2015  . Wears partial dentures    upper    Past Surgical History:  Procedure Laterality Date  . AORTIC VALVE REPLACEMENT N/A 09/28/2014   Procedure: AORTIC VALVE REPLACEMENT (AVR);  Surgeon: [name], MD;  Location: [place];  Service: Open Heart Surgery;  Laterality: N/A;  . CARDIAC CATHETERIZATION  N/A 09/23/2014   Procedure: Left Heart Cath and Coronary Angiography;  Surgeon: [name], MD;  Location: [place] [facility];  Service: Cardiovascular;  Laterality: N/A;  . CATARACT EXTRACTION    . CHOLECYSTECTOMY N/A 10/28/2016   Procedure: LAPAROSCOPIC CHOLECYSTECTOMY;  Surgeon: Ralene Ok, MD;  Location: [place];  Service: General;  Laterality: N/A;  . COLONOSCOPY    . CORONARY ARTERY BYPASS GRAFT N/A 09/28/2014   Procedure: CORONARY ARTERY BYPASS GRAFTING (CABG) x4 using left internal mammory artery and right saphenous leg vein harvested endoscopically.;  Surgeon: [name], MD;  Location: [place];  Service: Open Heart Surgery;  Laterality: N/A;  . INGUINAL HERNIA REPAIR Right 04/12/2019   Procedure: LAPAROSCOPIC RIGHT INGUINAL HERNIA REPAIR WITH MESH;  Surgeon: Ralene Ok, MD;  Location: [place];  Service: General;  Laterality: Right;  . kidney stone removal    . PROSTATECTOMY    . TEE WITHOUT CARDIOVERSION N/A 09/28/2014   Procedure: TRANSESOPHAGEAL ECHOCARDIOGRAM (TEE);  Surgeon: [name], MD;  Location: [place];  Service: Open Heart Surgery;  Laterality: N/A;  . TONSILLECTOMY    . WISDOM TOOTH EXTRACTION       Current Outpatient Medications  Medication Sig Dispense Refill  . acetaminophen (TYLENOL) 500 MG tablet Take 1,000 mg by mouth every 6 (six) hours as needed for headache (pain).     Marland Kitchen acyclovir (ZOVIRAX) 800 MG tablet Take 400 mg by mouth in the morning and at bedtime.    Marland Kitchen aspirin EC 81 MG tablet Take 81 mg by mouth at bedtime. Swallow whole.    Marland Kitchen atorvastatin (LIPITOR) 40 MG tablet TAKE 1 TABLET BY MOUTH AT BEDTIME. 90 tablet 3  . carvedilol (COREG) 6.25 MG tablet TAKE 1 TABLET TWICE DAILY 180 tablet 3  . esomeprazole (NEXIUM) 20 MG capsule Take 20 mg by mouth every other day. Reported on 03/01/2015    . lisinopril (ZESTRIL) 20 MG tablet TAKE 1 TABLET TWICE DAILY 180 tablet 1  . meloxicam (MOBIC) 15 MG tablet Take 15 mg by mouth at bedtime.     .  Multiple Vitamins-Minerals (MULTIVITAMIN WITH MINERALS) tablet Take 1 tablet by mouth daily.    Marland Kitchen oxybutynin (DITROPAN) 5 MG tablet Take 5 mg by mouth 2 (two) times daily.     . prednisoLONE acetate (PRED FORTE) 1 % ophthalmic suspension Place 1 drop into the left eye in the morning, at noon, and at bedtime.     Marland Kitchen spironolactone (ALDACTONE) 25 MG tablet Take 25 mg by mouth as directed. Take 1/2 tablet daily    . timolol (TIMOPTIC) 0.5 % ophthalmic solution Place 1 drop into the left eye 2 (two) times daily.     No current facility-administered medications for this visit.    Allergies:  Hydrochlorothiazide    Social History:  The patient  reports that he quit smoking about 39 years ago. His smoking use included cigarettes. He has never used smokeless tobacco. He reports current alcohol use. He reports that he does not use drugs.   Family History:  The patient's family history includes Ovarian cancer in his mother.    ROS:  Please see the history of present illness.   Otherwise, review of systems are positive for none.   All other systems are reviewed and negative.    PHYSICAL EXAM: VS:  BP 132/76   Pulse 60   Ht 6\' 1"  (1.854 m)   Wt 165 lb 6.4 oz (75 kg)   SpO2 93%   BMI 21.82 kg/m  , BMI Body mass index is 21.82 kg/m. GENERAL:  Well appearing HEENT: Pupils equal round and reactive, fundi not visualized, oral mucosa unremarkable NECK:  No jugular venous distention, waveform within normal limits, carotid upstroke brisk and symmetric, no bruits LUNGS:  Clear to auscultation bilaterally HEART:  RRR.  PMI not displaced or sustained,S1 and S2 within normal limits, no S3, no S4, no clicks, no rubs, III/VI  murmurs ABD:  Flat, positive bowel sounds normal in frequency in pitch, no bruits, no rebound, no guarding, no midline pulsatile mass, no hepatomegaly, no splenomegaly EXT:  2 plus pulses throughout, no edema, no cyanosis no clubbing SKIN:  No rashes no nodules NEURO:  Cranial nerves  II through XII grossly intact, motor grossly intact throughout PSYCH:  Cognitively intact, oriented to person place and time   Recent Labs: 04/07/2019: Hemoglobin 15.5; Platelets 195 10/05/2019: BUN 23; Creatinine, Ser 1.20; Potassium 4.8; Sodium 138   11/25/2017: Total cholesterol 114, triglycerides 91, HDL 40, LDL 55 Potassium 4.9, creatinine 0.9  Lipid Panel No results found for: CHOL, TRIG, HDL, CHOLHDL, VLDL, LDLCALC, LDLDIRECT    Wt Readings from Last 3 Encounters:  04/05/20 165 lb 6.4 oz (75 kg)  02/25/20 164 lb (74.4 kg)  02/17/20 160 lb (72.6 kg)    EKG 03/21/17: Sinus rhythm.  Rate 60 bpm.  Left axis deviation.  03/23/18: Sinus bradycardia.  Rate 54 bpm.  First degree AV block.  LAFB.   03/23/19: Sinus rhythm.  Rate 60 bpm.  Left axis deviation.  Prior anteroseptal infarct.  IVCD.  Echo 11/01/14: Study Conclusions  - Left ventricle: The cavity size was normal. Wall thickness was increased in a pattern of moderate LVH. Systolic function was normal. The estimated ejection fraction was in the range of 55% to 60%. Wall motion was normal; there were no regional wall motion abnormalities. - Aortic valve: Normal appearing tissue AVR with no peri valvular regurgitation. - Mitral valve: There was mild regurgitation. - Left atrium: The atrium was mildly dilated. - Atrial septum: No defect or patent foramen ovale was identified.  ASSESSMENT AND PLAN:  # Syncope: #  Hypertensive heart disease:   Mr. Cansler continues to have labile hypertension with low episodes with exertion.  His exertional tolerance is also change.  We will get Lexiscan Myoview to assess for ischemia.  We will also repeat his echo given his history of his aortic valve replacement that has not been reassessed in years.  Reduce spironolactone to 12.5 mg daily.  Otherwise, continue his regimen.  I did instruct him to not take the spironolactone if his blood pressure is less than 110 when he checks it in the  mornings.  # Hyperlipidemia:   Continue atorvastatin.  LDL was 55 on 06/2019.  #  CAD: S/p CABG. Lexiscan Myoview as above.  Continue aspirin, carvedilol and atorvastatin.  # s/p AVR: Mr. Ludtke has a bioprosthetic AVR and is doing well.  He has no chest pain, shortness of breath, or edema.  He needs antibiotic prophylaxis prior to dental procedures.  Echo as above.  # LE Edema: No DVT on lower extremity Dopplers 03/2018.  Improved since reducing amlodipine and adding spironolactone  # Fatigue: # Labile blood pressure:  Check TSH and cortisol.  Current medicines are reviewed at length with the patient today.  The patient does not have concerns regarding medicines.  The following changes have been made:  none  Labs/ tests ordered today include:   Orders Placed This Encounter  Procedures  . TSH  . Cortisol  . MYOCARDIAL PERFUSION IMAGING  . ECHOCARDIOGRAM COMPLETE    Disposition:   FU with T[place]on Ballow C. Oval Linsey, MD in 6 months. APP in 2 months  Signed, [name], MD  04/05/2020 12:04 PM    [place]

## 2020-04-05 NOTE — Patient Instructions (Signed)
Medication Instructions:  DECREASE YOU SPIRONOLACTONE TO 25 MG 1/2 TABLET DAILY   *If you need a refill on your cardiac medications before your next appointment, please call your pharmacy*  Lab Work: CORTISOL/TSH TODAY    Testing/Procedures: Your physician has requested that you have a lexiscan myoview. For further information please visit [email or web address]. Please follow instruction sheet, as given.  Your physician has requested that you have an echocardiogram. Echocardiography is a painless test that uses sound waves to create images of your heart. It provides your doctor with information about the size and shape of your heart and how well your heart's chambers and valves are working. This procedure takes approximately one hour. There are no restrictions for this procedure.  Follow-Up: At [facility], you and your health needs are our priority.  As part of our continuing mission to provide you with exceptional heart care, we have created designated Provider Care Teams.  These Care Teams include your primary Cardiologist (physician) and Advanced Practice Providers (APPs -  Physician Assistants and Nurse Practitioners) who all work together to provide you with the care you need, when you need it.  We recommend signing up for the patient portal called "MyChart".  Sign up information is provided on this After Visit Summary.  MyChart is used to connect with patients for Virtual Visits (Telemedicine).  Patients are able to view lab/test results, encounter notes, upcoming appointments, etc.  Non-urgent messages can be sent to your provider as well.   To learn more about what you can do with MyChart, go to [email or web address].    Your next appointment:   6 month(s)  The format for your next appointment:   In Person  Provider:   You may see [name], MD or one of the following Advanced Practice Providers on your designated Care Team:    Sande Rives, PA-C  [name],  FNP  Your physician recommends that you schedule a follow-up appointment in: 2 MONTHS WITH PA/NP

## 2020-04-06 LAB — TSH: TSH: 0.027 u[IU]/mL — ABNORMAL LOW (ref 0.450–4.500)

## 2020-04-06 LAB — CORTISOL: Cortisol: 15.5 ug/dL

## 2020-04-11 ENCOUNTER — Other Ambulatory Visit: Payer: Self-pay | Admitting: *Deleted

## 2020-04-11 DIAGNOSIS — R5383 Other fatigue: Secondary | ICD-10-CM

## 2020-04-11 DIAGNOSIS — I25110 Atherosclerotic heart disease of native coronary artery with unstable angina pectoris: Secondary | ICD-10-CM

## 2020-04-11 DIAGNOSIS — I10 Essential (primary) hypertension: Secondary | ICD-10-CM

## 2020-04-11 DIAGNOSIS — R0609 Other forms of dyspnea: Secondary | ICD-10-CM

## 2020-04-11 DIAGNOSIS — H903 Sensorineural hearing loss, bilateral: Secondary | ICD-10-CM | POA: Diagnosis not present

## 2020-04-11 DIAGNOSIS — R0600 Dyspnea, unspecified: Secondary | ICD-10-CM

## 2020-04-19 DIAGNOSIS — L821 Other seborrheic keratosis: Secondary | ICD-10-CM | POA: Diagnosis not present

## 2020-04-19 DIAGNOSIS — Z85828 Personal history of other malignant neoplasm of skin: Secondary | ICD-10-CM | POA: Diagnosis not present

## 2020-04-19 DIAGNOSIS — L905 Scar conditions and fibrosis of skin: Secondary | ICD-10-CM | POA: Diagnosis not present

## 2020-04-19 DIAGNOSIS — L570 Actinic keratosis: Secondary | ICD-10-CM | POA: Diagnosis not present

## 2020-04-25 ENCOUNTER — Telehealth: Payer: Self-pay | Admitting: Cardiovascular Disease

## 2020-04-25 NOTE — Telephone Encounter (Signed)
Patient returning call for lab results. 

## 2020-04-25 NOTE — Telephone Encounter (Signed)
Pt updated with lab results along with Dr. Blenda Mounts recommendations. Pt verbalized understanding.

## 2020-04-26 ENCOUNTER — Telehealth (HOSPITAL_COMMUNITY): Payer: Self-pay | Admitting: *Deleted

## 2020-04-26 ENCOUNTER — Telehealth (HOSPITAL_COMMUNITY): Payer: Self-pay

## 2020-04-26 NOTE — Telephone Encounter (Signed)
Left message on voicemail in reference to upcoming appointment scheduled for 05/03/20. Phone number given for a call back so details instructions can be given. Symone Cornman, Ranae Palms No mychart

## 2020-04-26 NOTE — Telephone Encounter (Signed)
Pt called for his instructions for his stress test. Detailed instructions were given. He stated that he would be here for his test. Asked to call back with any questions. S.Jorey Dollard EMTP

## 2020-05-03 ENCOUNTER — Ambulatory Visit (HOSPITAL_BASED_OUTPATIENT_CLINIC_OR_DEPARTMENT_OTHER): Payer: Medicare HMO

## 2020-05-03 ENCOUNTER — Other Ambulatory Visit: Payer: Self-pay

## 2020-05-03 ENCOUNTER — Ambulatory Visit (HOSPITAL_COMMUNITY): Payer: Medicare HMO | Attending: Cardiology

## 2020-05-03 DIAGNOSIS — Z953 Presence of xenogenic heart valve: Secondary | ICD-10-CM

## 2020-05-03 DIAGNOSIS — R0600 Dyspnea, unspecified: Secondary | ICD-10-CM

## 2020-05-03 DIAGNOSIS — R0609 Other forms of dyspnea: Secondary | ICD-10-CM

## 2020-05-03 LAB — ECHOCARDIOGRAM COMPLETE
AV Mean grad: 18 mmHg
AV Peak grad: 24.5 mmHg
Ao pk vel: 2.47 m/s
Area-P 1/2: 2.11 cm2
S' Lateral: 2.7 cm

## 2020-05-03 LAB — MYOCARDIAL PERFUSION IMAGING
LV dias vol: 99 mL (ref 62–150)
LV sys vol: 37 mL
Peak HR: 80 {beats}/min
Rest HR: 54 {beats}/min
SDS: 5
SRS: 0
SSS: 5
TID: 1.09

## 2020-05-03 MED ORDER — TECHNETIUM TC 99M TETROFOSMIN IV KIT
29.1000 | PACK | Freq: Once | INTRAVENOUS | Status: AC | PRN
Start: 2020-05-03 — End: 2020-05-03
  Administered 2020-05-03: 29.1 via INTRAVENOUS
  Filled 2020-05-03: qty 30

## 2020-05-03 MED ORDER — REGADENOSON 0.4 MG/5ML IV SOLN
0.4000 mg | Freq: Once | INTRAVENOUS | Status: AC
Start: 2020-05-03 — End: 2020-05-03
  Administered 2020-05-03: 0.4 mg via INTRAVENOUS

## 2020-05-03 MED ORDER — TECHNETIUM TC 99M TETROFOSMIN IV KIT
10.1000 | PACK | Freq: Once | INTRAVENOUS | Status: AC | PRN
  Administered 2020-05-03: 10.1 via INTRAVENOUS
  Filled 2020-05-03: qty 11

## 2020-05-28 NOTE — Progress Notes (Signed)
Cardiology Clinic Note   Patient Name: [name] [name] Date of Encounter: 05/30/2020  Primary Care Provider:  [name], MD Primary Cardiologist:  [name], MD  Patient Profile    [name] [name] 80 year old male presents today for follow-up evaluation of his hypertension, CAD, and aortic stenosis.  Past Medical History    Past Medical History:  Diagnosis Date  . Arthritis    knee  . Cataracts, bilateral   . Chronic kidney disease    stage 2  . Fatigue 04/05/2020  . GERD (gastroesophageal reflux disease)   . Glaucoma   . Hearing loss    wears hearing aids  . History of kidney stones    passed stones and 1 time had surgery  . Hyperlipidemia   . Hypertension   . Inguinal hernia 03/2019  . Pure hypercholesterolemia 03/23/2019  . S/P aortic valve replacement with bioprosthetic valve 03/23/2019   Bioprosthetic 09/2014  . Shingles   . Varicose veins 03/31/2015  . Wears partial dentures    upper   Past Surgical History:  Procedure Laterality Date  . AORTIC VALVE REPLACEMENT N/A 09/28/2014   Procedure: AORTIC VALVE REPLACEMENT (AVR);  Surgeon: [name], MD;  Location: [place];  Service: Open Heart Surgery;  Laterality: N/A;  . CARDIAC CATHETERIZATION N/A 09/23/2014   Procedure: Left Heart Cath and Coronary Angiography;  Surgeon: [name], MD;  Location: [facility];  Service: Cardiovascular;  Laterality: N/A;  . CATARACT EXTRACTION    . CHOLECYSTECTOMY N/A 10/28/2016   Procedure: LAPAROSCOPIC CHOLECYSTECTOMY;  Surgeon: Ralene Ok, MD;  Location: [place];  Service: General;  Laterality: N/A;  . COLONOSCOPY    . CORONARY ARTERY BYPASS GRAFT N/A 09/28/2014   Procedure: CORONARY ARTERY BYPASS GRAFTING (CABG) x4 using left internal mammory artery and right saphenous leg vein harvested endoscopically.;  Surgeon: [name], MD;  Location: [place];  Service: Open Heart Surgery;  Laterality: N/A;  . INGUINAL HERNIA REPAIR Right 04/12/2019    Procedure: LAPAROSCOPIC RIGHT INGUINAL HERNIA REPAIR WITH MESH;  Surgeon: Ralene Ok, MD;  Location: [place];  Service: General;  Laterality: Right;  . kidney stone removal    . PROSTATECTOMY    . TEE WITHOUT CARDIOVERSION N/A 09/28/2014   Procedure: TRANSESOPHAGEAL ECHOCARDIOGRAM (TEE);  Surgeon: [name], MD;  Location: [place];  Service: Open Heart Surgery;  Laterality: N/A;  . TONSILLECTOMY    . WISDOM TOOTH EXTRACTION      Allergies  Allergies  Allergen Reactions  . Hydrochlorothiazide Swelling    History of Present Illness    [name] [name] has a PMH of hypertension, CAD status post CABG (LIMA-LAD, SVG OM1, SVG PDA), aortic stenosis status post AVR, bradycardia and syncope.  He underwent a four-vessel CABG and AVR on 09/28/2014.  His postoperative course was complicated by atrial fibrillation/flutter however, he was discharged in sinus rhythm.  He had an episode of lightheadedness 8/17 and was sent to the ED.  He was noted to have acute renal failure and was felt to be dehydrated.  He had been working in the yard for several hours a day before and was not well-hydrated.  His metoprolol was switched to carvedilol due to bradycardia and hypertension.  He was also started on amlodipine therefore his simvastatin was switched to atorvastatin.  He was last seen by Dr. Oval Linsey on 03/23/2019.  During that time he was doing well.  He has been checking his blood pressure often and noticed it was  in the 140s over 41s and 70s.  He had been feeling well overall.  He did indicate he was struggling with left knee pain.  However, this is somewhat improved with knee injections.  He was also having right lower extremity edema which was chronic.  Previously he had undergone lower extremity Dopplers that were negative for DVT.  Compression stockings were recommended by his PCP which were helping when he would wear them.  He denied chest pain and shortness of breath.  He was walking for exercise and had no  exertional chest pain.  He underwent right inguinal hernia repair on 04/12/2019 and tolerated the procedure well.  He presented to the clinic 06/23/2019 for follow-up evaluation and stated he felt well.  His blood pressure was much better controlled at home in the 120s-110s over 60s-70s.  He had not had any episodes of lightheadedness or dizziness.  He stated he was not adding sodium to his food and he had been wearing his lower extremity compression stockings.  I  gave him the salty 6 diet sheet, had him continue wearing his compression stockings, and planned follow-up with Dr. Oval Linsey in 3 months.  He was seen by Dr. Oval Linsey on 04/05/2020.  At his previous appointment he was noted to have hypotension and syncope.  It was felt that this occurred in the setting of not eating/drinking well and weight loss.  His symptoms were felt to be orthostatic.  His amlodipine was discontinued at that time.  He then followed up with [name], DNP 2/22.  He was not orthostatic at that time.  However, he did continue to have positional lightheadedness.  His blood pressure continued to be labile.  He reported he was trying to eat and drink more.  He denied associated chest pain.  He reported having increased trouble with physical activity that he did not have trouble with in the past.  He reported getting tired trying to start his chainsaw.  And his wife noted that he would become diaphoretic with these episodes.  He denied lower extremity edema, orthopnea, and PND.  His follow-up echocardiogram 05/03/2020 showed an EF of 55%, G1 DD, mild-moderate dilation of his left atria, mild to diuretic right atria, mild mitral valve regurgitation, tricuspid regurgitation mild-moderate, well-functioning aortic valve, and no pericardial effusion.  He presents to the clinic today for follow-up evaluation states he feels fairly well except for having low energy.  We reviewed his echocardiogram and his stress test results.  He  expressed understanding.  We reviewed his TSH results.  He reports that he contacted his PCP who has referred him to endocrinology.  He has a appointment with endocrinology on 07/15/2020.  I will order a CBC today and have him follow-up in 6 months.  Today he denies chest pain, shortness of breath, lower extremity edema, increased fatigue, palpitations, melena, hematuria, hemoptysis, diaphoresis, weakness, presyncope, syncope, orthopnea, and PND.  Home Medications    Prior to Admission medications   Medication Sig Start Date End Date Taking? Authorizing Provider  acetaminophen (TYLENOL) 500 MG tablet Take 1,000 mg by mouth every 6 (six) hours as needed for headache (pain).     [provider]  acyclovir (ZOVIRAX) 800 MG tablet Take 400 mg by mouth in the morning and at bedtime. 01/26/19   [provider]  aspirin EC 81 MG tablet Take 81 mg by mouth at bedtime. Swallow whole.    [provider]  atorvastatin (LIPITOR) 40 MG tablet TAKE 1 TABLET BY MOUTH  AT BEDTIME. 09/24/19   [name], MD  carvedilol (COREG) 6.25 MG tablet TAKE 1 TABLET TWICE DAILY 09/24/19   [name], MD  esomeprazole (NEXIUM) 20 MG capsule Take 20 mg by mouth every other day. Reported on 03/01/2015    [provider]  lisinopril (ZESTRIL) 20 MG tablet TAKE 1 TABLET TWICE DAILY 03/21/20   Lendon Colonel, NP  meloxicam (MOBIC) 15 MG tablet Take 15 mg by mouth at bedtime.     [provider]  Multiple Vitamins-Minerals (MULTIVITAMIN WITH MINERALS) tablet Take 1 tablet by mouth daily.    [provider]  oxybutynin (DITROPAN) 5 MG tablet Take 5 mg by mouth 2 (two) times daily.  10/22/16   [provider]  prednisoLONE acetate (PRED FORTE) 1 % ophthalmic suspension Place 1 drop into the left eye in the morning, at noon, and at bedtime.     [provider]  spironolactone (ALDACTONE) 25 MG tablet Take 25 mg by mouth as directed. Take 1/2 tablet daily     [provider]  timolol (TIMOPTIC) 0.5 % ophthalmic solution Place 1 drop into the left eye 2 (two) times daily. 09/30/16   [provider]    Family History    Family History  Problem Relation Age of Onset  . Ovarian cancer Mother    He indicated that the status of his mother is unknown.  Social History    Social History   Socioeconomic History  . Marital status: Married    Spouse name: Not on file  . Number of children: Not on file  . Years of education: Not on file  . Highest education level: Not on file  Occupational History  . Not on file  Tobacco Use  . Smoking status: Former Smoker    Types: Cigarettes    Quit date: 05/22/1980    Years since quitting: 40.0  . Smokeless tobacco: Never Used  . Tobacco comment: quit smoking in 1982  Vaping Use  . Vaping Use: Never used  Substance and Sexual Activity  . Alcohol use: Yes    Alcohol/week: 0.0 standard drinks    Comment: occasional liquor/ Beer  . Drug use: No  . Sexual activity: Yes  Other Topics Concern  . Not on file  Social History Narrative  . Not on file   Social Determinants of Health   Financial Resource Strain: Not on file  Food Insecurity: Not on file  Transportation Needs: Not on file  Physical Activity: Not on file  Stress: Not on file  Social Connections: Not on file  Intimate Partner Violence: Not on file     Review of Systems    General:  No chills, fever, night sweats or weight changes.  Cardiovascular:  No chest pain, dyspnea on exertion, edema, orthopnea, palpitations, paroxysmal nocturnal dyspnea. Dermatological: No rash, lesions/masses Respiratory: No cough, dyspnea Urologic: No hematuria, dysuria Abdominal:   No nausea, vomiting, diarrhea, bright red blood per rectum, melena, or hematemesis Neurologic:  No visual changes, wkns, changes in mental status. All other systems reviewed and are otherwise negative except as noted above.  Physical Exam    VS:  BP 136/70  (BP Location: Left Arm, Patient Position: Sitting, Cuff Size: Normal)   Pulse (!) 56   Ht 6\' 1"  (1.854 m)   Wt 163 lb 6.4 oz (74.1 kg)   SpO2 96%   BMI 21.56 kg/m  , BMI Body mass index is 21.56 kg/m. GEN: Well nourished, well developed, in no  acute distress. HEENT: normal. Neck: Supple, no JVD, carotid bruits, or masses. Cardiac: RRR, no murmurs, rubs, or gallops. No clubbing, cyanosis, edema.  Radials/DP/PT 2+ and equal bilaterally.  Respiratory:  Respirations regular and unlabored, clear to auscultation bilaterally. GI: Soft, nontender, nondistended, BS + x 4. MS: no deformity or atrophy. Skin: warm and dry, no rash. Neuro:  Strength and sensation are intact. Psych: Normal affect.  Accessory Clinical Findings    Recent Labs: 10/05/2019: BUN 23; Creatinine, Ser 1.20; Potassium 4.8; Sodium 138 04/05/2020: TSH 0.027   Recent Lipid Panel No results found for: CHOL, TRIG, HDL, CHOLHDL, VLDL, LDLCALC, LDLDIRECT  ECG personally reviewed by me today-none today.  Echocardiogram 11/01/14: Study Conclusions  - Left ventricle: The cavity size was normal. Wall thickness was increased in a pattern of moderate LVH. Systolic function was normal. The estimated ejection fraction was in the range of 55% to 60%. Wall motion was normal; there were no regional wall motion abnormalities. - Aortic valve: Normal appearing tissue AVR with no peri valvular regurgitation. - Mitral valve: There was mild regurgitation. - Left atrium: The atrium was mildly dilated. - Atrial septum: No defect or patent foramen ovale was identified.  Echocardiogram 05/03/2020 IMPRESSIONS    1. Left ventricular ejection fraction, by estimation, is 55%. The left  ventricle has normal function. The left ventricle has no regional wall  motion abnormalities. There is mild left ventricular hypertrophy. Left  ventricular diastolic parameters are  consistent with Grade I diastolic dysfunction (impaired  relaxation).  2. Right ventricular systolic function is normal. The right ventricular  size is normal. There is normal pulmonary artery systolic pressure. The  estimated right ventricular systolic pressure is 40.9 mmHg.  3. Left atrial size was mild to moderately dilated.  4. Right atrial size was mildly dilated.  5. The mitral valve is normal in structure. Mild mitral valve  regurgitation. No evidence of mitral stenosis.  6. Tricuspid valve regurgitation is mild to moderate.  7. Bioprosthetic aortic valve replacement. Mean gradient 18 mmHg. No  significant regurgitation.  8. The inferior vena cava is normal in size with greater than 50%  respiratory variability, suggesting right atrial pressure of 3 mmHg.  Nuclear stress test 05/03/2020  The left ventricular ejection fraction is normal (55-65%).  Nuclear stress EF: 63%.  There was no ST segment deviation noted during stress.  This is a low risk study.   There is a small, moderate, reversible defect in the inferoapex.  Wall motion is normal in this region.  There is significant extracardiac bowel tracer uptake adjacent to the inferoapex with stress on both supine and upright stress images.  This finding likely represents attenuation artifact from extracardiac tracer uptake, but cannot exclude a small area of ischemia.  Overall study is low risk.   Assessment & Plan   1.  Syncope/Hypertensive heart disease-BP today 136/70.    Echocardiogram showed normal LVEF, G1 DD, and stable aortic valve.  Nuclear stress test showed low risk . Continue spironolactone, carvedilol, lisinopril Heart healthy low-sodium diet-salty 6 given Increase physical activity as tolerated Avoid hypertension triggers-information sheet given Continue blood pressure log  Fatigue- reports continued fatigue.  Has appointment with endocrinology 07/15/2020.  Denies bleeding issues. Order CBC   Hyperlipidemia-LDL 55 on 6/21 Continue atorvastatin Heart  healthy low-sodium high-fiber diet Increase physical activity as tolerated  Coronary artery disease-no chest pain today.  Status post CABG Continue aspirin, carvedilol, and atorvastatin.  Nuclear stress test showed low risk. Heart healthy low-sodium diet Increase  physical activity as tolerated  Aortic valve replacement-status post bioprosthetic AVR.  No increased dyspnea or activity intolerance.  Echocardiogram showed stable aortic valve. Continue SBE prophylaxis  Lower extremity edema-chronic.  (Generalized lower extremity nonpitting) underwent lower extremity Dopplers 3/20 which showed no DVT Continue lower extremity support stockings Heart healthy low-sodium diet Increase physical activity as tolerated  Disposition: Follow-up with Dr. Oval Linsey in 6 months.   Jossie Ng. Braeden Kennan NP-C    05/30/2020, 12:21 PM [facility] [place] [address] Office [phone] Fax [phone]  Notice: This dictation was prepared with Dragon dictation along with smaller phrase technology. Any transcriptional errors that result from this process are unintentional and may not be corrected upon review.  I spent 15 minutes examining this patient, reviewing medications, and using patient centered shared decision making involving her cardiac care.  Prior to her visit I spent greater than 20 minutes reviewing her past medical history,  medications, and prior cardiac tests.

## 2020-05-30 ENCOUNTER — Other Ambulatory Visit: Payer: Self-pay

## 2020-05-30 ENCOUNTER — Encounter: Payer: Self-pay | Admitting: General Practice

## 2020-05-30 ENCOUNTER — Ambulatory Visit: Payer: Medicare HMO | Admitting: General Practice

## 2020-05-30 VITALS — BP 136/70 | HR 56 | Ht 73.0 in | Wt 163.4 lb

## 2020-05-30 DIAGNOSIS — I25110 Atherosclerotic heart disease of native coronary artery with unstable angina pectoris: Secondary | ICD-10-CM

## 2020-05-30 DIAGNOSIS — R55 Syncope and collapse: Secondary | ICD-10-CM

## 2020-05-30 DIAGNOSIS — I119 Hypertensive heart disease without heart failure: Secondary | ICD-10-CM

## 2020-05-30 DIAGNOSIS — R600 Localized edema: Secondary | ICD-10-CM

## 2020-05-30 DIAGNOSIS — Z79899 Other long term (current) drug therapy: Secondary | ICD-10-CM | POA: Diagnosis not present

## 2020-05-30 DIAGNOSIS — E7800 Pure hypercholesterolemia, unspecified: Secondary | ICD-10-CM | POA: Diagnosis not present

## 2020-05-30 DIAGNOSIS — Z953 Presence of xenogenic heart valve: Secondary | ICD-10-CM

## 2020-05-30 LAB — CBC
Hematocrit: 43.2 % (ref 37.5–51.0)
Hemoglobin: 14.4 g/dL (ref 13.0–17.7)
MCH: 32.2 pg (ref 26.6–33.0)
MCHC: 33.3 g/dL (ref 31.5–35.7)
MCV: 97 fL (ref 79–97)
Platelets: 219 10*3/uL (ref 150–450)
RBC: 4.47 x10E6/uL (ref 4.14–5.80)
RDW: 12.1 % (ref 11.6–15.4)
WBC: 7.4 10*3/uL (ref 3.4–10.8)

## 2020-05-30 NOTE — Patient Instructions (Addendum)
Medication Instructions:  The current medical regimen is effective;  continue present plan and medications as directed. Please refer to the Current Medication list given to you today. *If you need a refill on your cardiac medications before your next appointment, please call your pharmacy*  Lab Work:   Testing/Procedures:  CBC TODAY   NONE  Special Instructions PLEASE READ AND FOLLOW SALTY 6-ATTACHED-1,800mg  daily  PLEASE INCREASE PHYSICAL ACTIVITY AS TOLERATED  Follow-Up: Your next appointment:  6 month(s) In Person with [name], MD OR IF UNAVAILABLE [name], FNP-C   Please call our office 2 months in advance to schedule this appointment   At [facility], you and your health needs are our priority.  As part of our continuing mission to provide you with exceptional heart care, we have created designated Provider Care Teams.  These Care Teams include your primary Cardiologist (physician) and Advanced Practice Providers (APPs -  Physician Assistants and Nurse Practitioners) who all work together to provide you with the care you need, when you need it.  We recommend signing up for the patient portal called "MyChart".  Sign up information is provided on this After Visit Summary.  MyChart is used to connect with patients for Virtual Visits (Telemedicine).  Patients are able to view lab/test results, encounter notes, upcoming appointments, etc.  Non-urgent messages can be sent to your provider as well.   To learn more about what you can do with MyChart, go to [email or web address].              6 SALTY THINGS TO AVOID     1,800MG  DAILY

## 2020-06-26 DIAGNOSIS — E7800 Pure hypercholesterolemia, unspecified: Secondary | ICD-10-CM | POA: Diagnosis not present

## 2020-06-26 DIAGNOSIS — I517 Cardiomegaly: Secondary | ICD-10-CM | POA: Diagnosis not present

## 2020-06-26 DIAGNOSIS — R946 Abnormal results of thyroid function studies: Secondary | ICD-10-CM | POA: Diagnosis not present

## 2020-06-26 DIAGNOSIS — R5383 Other fatigue: Secondary | ICD-10-CM | POA: Diagnosis not present

## 2020-06-26 DIAGNOSIS — K219 Gastro-esophageal reflux disease without esophagitis: Secondary | ICD-10-CM | POA: Diagnosis not present

## 2020-06-26 DIAGNOSIS — G250 Essential tremor: Secondary | ICD-10-CM | POA: Diagnosis not present

## 2020-06-26 DIAGNOSIS — I1310 Hypertensive heart and chronic kidney disease without heart failure, with stage 1 through stage 4 chronic kidney disease, or unspecified chronic kidney disease: Secondary | ICD-10-CM | POA: Diagnosis not present

## 2020-06-26 DIAGNOSIS — I2510 Atherosclerotic heart disease of native coronary artery without angina pectoris: Secondary | ICD-10-CM | POA: Diagnosis not present

## 2020-06-26 DIAGNOSIS — M1990 Unspecified osteoarthritis, unspecified site: Secondary | ICD-10-CM | POA: Diagnosis not present

## 2020-06-26 DIAGNOSIS — N182 Chronic kidney disease, stage 2 (mild): Secondary | ICD-10-CM | POA: Diagnosis not present

## 2020-06-26 DIAGNOSIS — Z0000 Encounter for general adult medical examination without abnormal findings: Secondary | ICD-10-CM | POA: Diagnosis not present

## 2020-07-03 DIAGNOSIS — H4052X3 Glaucoma secondary to other eye disorders, left eye, severe stage: Secondary | ICD-10-CM | POA: Diagnosis not present

## 2020-07-03 DIAGNOSIS — H4052X4 Glaucoma secondary to other eye disorders, left eye, indeterminate stage: Secondary | ICD-10-CM | POA: Diagnosis not present

## 2020-07-19 ENCOUNTER — Encounter: Payer: Self-pay | Admitting: Internal Medicine

## 2020-07-19 ENCOUNTER — Ambulatory Visit (INDEPENDENT_AMBULATORY_CARE_PROVIDER_SITE_OTHER): Payer: Medicare HMO | Admitting: Internal Medicine

## 2020-07-19 ENCOUNTER — Other Ambulatory Visit: Payer: Self-pay

## 2020-07-19 VITALS — BP 126/74 | HR 60 | Ht 72.0 in | Wt 164.0 lb

## 2020-07-19 DIAGNOSIS — E0590 Thyrotoxicosis, unspecified without thyrotoxic crisis or storm: Secondary | ICD-10-CM | POA: Diagnosis not present

## 2020-07-19 LAB — TSH: TSH: 0.11 u[IU]/mL — ABNORMAL LOW (ref 0.35–4.50)

## 2020-07-19 LAB — COMPREHENSIVE METABOLIC PANEL
ALT: 18 U/L (ref 0–53)
AST: 20 U/L (ref 0–37)
Albumin: 4.4 g/dL (ref 3.5–5.2)
Alkaline Phosphatase: 68 U/L (ref 39–117)
BUN: 25 mg/dL — ABNORMAL HIGH (ref 6–23)
CO2: 28 mEq/L (ref 19–32)
Calcium: 9.8 mg/dL (ref 8.4–10.5)
Chloride: 105 mEq/L (ref 96–112)
Creatinine, Ser: 1.15 mg/dL (ref 0.40–1.50)
GFR: 59.78 mL/min — ABNORMAL LOW (ref >60.00)
Glucose, Bld: 101 mg/dL — ABNORMAL HIGH (ref 70–99)
Potassium: 5.5 mEq/L — ABNORMAL HIGH (ref 3.5–5.1)
Sodium: 139 mEq/L (ref 135–145)
Total Bilirubin: 1 mg/dL (ref 0.2–1.2)
Total Protein: 7.2 g/dL (ref 6.0–8.3)

## 2020-07-19 LAB — T4, FREE: Free T4: 0.8 ng/dL (ref 0.60–1.60)

## 2020-07-19 MED ORDER — METHIMAZOLE 5 MG PO TABS: 5.0000 mg | ORAL_TABLET | Freq: Every day | ORAL | 1 refills | Status: DC

## 2020-07-19 NOTE — Progress Notes (Signed)
Name: [name] [name]  [name] [name], 04-19-1940    Age/ Sex: 80 y.o., male    PCP: [name], MD   Reason for Endocrinology Evaluation: Subclinical Hyperthyroidism     Date of Initial Endocrinology Evaluation: 07/19/2020     HPI: [name] is a 80 y.o. male with a past medical history of HTN, CAD (S/P CABG), S/P Aortic valve replacement, Hx of post-op A.Fib . The patient presented for initial endocrinology clinic visit on 07/19/2020 for consultative assistance with his Subclinical hyperthyroidism.   During cardiac follow up and c/o fatigue he was noted to have low TSH at 0.027 uIU/mL  No Amiodarone use   Today he is accompanied by family member  He continues to have exertional fatigue , he has noted this is associated with low BP, he was recently seen by cardiology and amlodipine has been discontinued and his spironolactone has been reduced by 50%  Weight has  been steady  Has occasional loose stools  Has chronic involuntary hand movement but not tremors per se Denies anxiety or jittery sensation  Denies local neck symptoms  NO recent viral infections   No vitamin intake at this time   No Fh of thyroid issues    HISTORY:  Past Medical History:  Past Medical History:  Diagnosis Date  . Arthritis    knee  . Cataracts, bilateral   . Chronic kidney disease    stage 2  . Fatigue 04/05/2020  . GERD (gastroesophageal reflux disease)   . Glaucoma   . Hearing loss    wears hearing aids  . History of kidney stones    passed stones and 1 time had surgery  . Hyperlipidemia   . Hypertension   . Inguinal hernia 03/2019  . Pure hypercholesterolemia 03/23/2019  . S/P aortic valve replacement with bioprosthetic valve 03/23/2019   Bioprosthetic 09/2014  . Shingles   . Varicose veins 03/31/2015  . Wears partial dentures    upper   Past Surgical History:  Past Surgical History:  Procedure Laterality Date  . AORTIC VALVE REPLACEMENT N/A 09/28/2014    Procedure: AORTIC VALVE REPLACEMENT (AVR);  Surgeon: [name], MD;  Location: [place];  Service: Open Heart Surgery;  Laterality: N/A;  . CARDIAC CATHETERIZATION N/A 09/23/2014   Procedure: Left Heart Cath and Coronary Angiography;  Surgeon: [name], MD;  Location: [place] [facility];  Service: Cardiovascular;  Laterality: N/A;  . CATARACT EXTRACTION    . CHOLECYSTECTOMY N/A 10/28/2016   Procedure: LAPAROSCOPIC CHOLECYSTECTOMY;  Surgeon: Ralene Ok, MD;  Location: [facility];  Service: General;  Laterality: N/A;  . COLONOSCOPY    . CORONARY ARTERY BYPASS GRAFT N/A 09/28/2014   Procedure: CORONARY ARTERY BYPASS GRAFTING (CABG) x4 using left internal mammory artery and right saphenous leg vein harvested endoscopically.;  Surgeon: [name], MD;  Location: [place];  Service: Open Heart Surgery;  Laterality: N/A;  . INGUINAL HERNIA REPAIR Right 04/12/2019   Procedure: LAPAROSCOPIC RIGHT INGUINAL HERNIA REPAIR WITH MESH;  Surgeon: Ralene Ok, MD;  Location: [place];  Service: General;  Laterality: Right;  . kidney stone removal    . PROSTATECTOMY    . TEE WITHOUT CARDIOVERSION N/A 09/28/2014   Procedure: TRANSESOPHAGEAL ECHOCARDIOGRAM (TEE);  Surgeon: [name], MD;  Location: [place];  Service: Open Heart Surgery;  Laterality: N/A;  . TONSILLECTOMY    . WISDOM TOOTH EXTRACTION      Social History:  reports that he  quit smoking about 40 years ago. His smoking use included cigarettes. He has never used smokeless tobacco. He reports current alcohol use. He reports that he does not use drugs. Family History: family history includes Ovarian cancer in his mother.   HOME MEDICATIONS: Allergies as of 07/19/2020       Reactions   Hydrochlorothiazide Swelling        Medication List        Accurate as of July 19, 2020  7:13 AM. If you have any questions, ask your nurse or doctor.          acetaminophen 500 MG tablet Commonly known as: TYLENOL Take 1,000 mg  by mouth every 6 (six) hours as needed for headache (pain).   acyclovir 800 MG tablet Commonly known as: ZOVIRAX Take 400 mg by mouth in the morning and at bedtime.   aspirin EC 81 MG tablet Take 81 mg by mouth at bedtime. Swallow whole.   atorvastatin 40 MG tablet Commonly known as: LIPITOR TAKE 1 TABLET BY MOUTH AT BEDTIME.   carvedilol 6.25 MG tablet Commonly known as: COREG TAKE 1 TABLET TWICE DAILY   esomeprazole 20 MG capsule Commonly known as: NEXIUM Take 20 mg by mouth every other day. Reported on 03/01/2015   lisinopril 20 MG tablet Commonly known as: ZESTRIL TAKE 1 TABLET TWICE DAILY   meloxicam 15 MG tablet Commonly known as: MOBIC Take 15 mg by mouth at bedtime.   multivitamin with minerals tablet Take 1 tablet by mouth daily.   oxybutynin 5 MG tablet Commonly known as: DITROPAN Take 5 mg by mouth 2 (two) times daily.   prednisoLONE acetate 1 % ophthalmic suspension Commonly known as: PRED FORTE Place 1 drop into the left eye in the morning, at noon, and at bedtime.   spironolactone 25 MG tablet Commonly known as: ALDACTONE Take 25 mg by mouth as directed. Take 1/2 tablet daily   timolol 0.5 % ophthalmic solution Commonly known as: TIMOPTIC Place 1 drop into the left eye 2 (two) times daily.          REVIEW OF SYSTEMS: A comprehensive ROS was conducted with the patient and is negative except as per HPI     OBJECTIVE:  VS: BP 126/74   Pulse 60   Ht 6' (1.829 m)   Wt 164 lb (74.4 kg)   SpO2 96%   BMI 22.24 kg/m   Wt Readings from Last 3 Encounters:  05/30/20 163 lb 6.4 oz (74.1 kg)  05/03/20 160 lb (72.6 kg)  04/05/20 165 lb 6.4 oz (75 kg)     EXAM: General: Pt appears well and is in NAD  Neck: General: Supple without adenopathy. Thyroid: Thyroid size normal.  No goiter or nodules appreciated. No thyroid bruit.  Lungs: Clear with good BS bilat with no rales, rhonchi, or wheezes  Heart: Auscultation: RRR.  Abdomen: Normoactive  bowel sounds, soft, nontender, without masses or organomegaly palpable  Extremities:  BL LE: No pretibial edema normal ROM and strength.  Skin: Hair: Texture and amount normal with gender appropriate distribution Skin Inspection: No rashes Skin Palpation: Skin temperature, texture, and thickness normal to palpation  Neuro: Cranial nerves: II - XII grossly intact  Motor: Normal strength throughout DTRs: 2+ and symmetric in UE without delay in relaxation phase  Mental Status: Judgment, insight: Intact Orientation: Oriented to time, place, and person Mood and affect: No depression, anxiety, or agitation     DATA REVIEWED: Results for [name] "DOUG" (MRN  [id number]) as of 07/19/2020 16:20  Ref. Range 07/19/2020 11:12  Sodium Latest Ref Range: 135 - 145 mEq/L 139  Potassium Latest Ref Range: 3.5 - 5.1 mEq/L 5.5 No hemolysis seen (H)  Chloride Latest Ref Range: 96 - 112 mEq/L 105  CO2 Latest Ref Range: 19 - 32 mEq/L 28  Glucose Latest Ref Range: 70 - 99 mg/dL 101 (H)  BUN Latest Ref Range: 6 - 23 mg/dL 25 (H)  Creatinine Latest Ref Range: 0.40 - 1.50 mg/dL 1.15  Calcium Latest Ref Range: 8.4 - 10.5 mg/dL 9.8  Alkaline Phosphatase Latest Ref Range: 39 - 117 U/L 68  Albumin Latest Ref Range: 3.5 - 5.2 g/dL 4.4  AST Latest Ref Range: 0 - 37 U/L 20  ALT Latest Ref Range: 0 - 53 U/L 18  Total Protein Latest Ref Range: 6.0 - 8.3 g/dL 7.2  Total Bilirubin Latest Ref Range: 0.2 - 1.2 mg/dL 1.0  GFR Latest Ref Range: >60.00 mL/min 59.78 (L)  TSH Latest Ref Range: 0.35 - 4.50 uIU/mL 0.11 (L)  T4,Free(Direct) Latest Ref Range: 0.60 - 1.60 ng/dL 0.80      ASSESSMENT/PLAN/RECOMMENDATIONS:   Subclinical Hyperthyroidism:  -Patient with nonspecific symptoms of fatigue -No local neck symptoms -We discussed differential diagnosis of Graves' disease, autonomous thyroid nodule, subacute thyroiditis -I have recommended proceeding with treatment given his history of coronary artery disease, as  well as postop A. fib requiring cardioversion.  We discussed with pt the benefits of methimazole in the Tx of hyperthyroidism, as well as the possible side effects/complications of anti-thyroid drug Tx (specifically detailing the rare, but serious side effect of agranulocytosis). He was informed of need for regular thyroid function monitoring while on methimazole to ensure appropriate dosage without over-treatment. As well, we discussed the possible side effects of methimazole including the chance of rash, the small chance of liver irritation/juandice and the <=1 in 300-400 chance of sudden onset agranulocytosis.  We discussed importance of going to ED promptly (and stopping methimazole) if hewere to develop significant fever with severe sore throat of other evidence of acute infection.      We extensively discussed the various treatment options for hyperthyroidism and Graves disease including ablation therapy with radioactive iodine versus antithyroid drug treatment versus surgical therapy.  We recommended to the patient that we felt, at this time, that thionamides therapy would be most optimal.  We discussed the various possible benefits versus side effects of the various therapies.   I carefully explained to the patient that one of the consequences of I-131 ablation treatment would likely be permanent hypothyroidism which would require long-term replacement therapy with LT4.  -I am going to check TR AB, if this is negative we will proceed with thyroid ultrasound at the time -No history of amiodarone use in the past no history of amiodarone use in the past     Medications : Start methimazole 5 mg daily Labs in 6 weeks  2.  Hyperkalemia:  -This is slightly elevated, I would suspect this will trend down with decreasing spironolactone dose by cardiology - No changes at this time, will monitor     Follow-up in 3 months   Addendum: Discussed results with the patient on 07/19/2020 at  1620  Signed electronically by: Mack Guise, MD  [facility]  [address] [place], [place] [address] Phone: [phone] FAX: [phone]   CC: [name], MD [address] [place] [address] Phone: [phone] Fax:  [phone]   Return to Endocrinology clinic as below: Future Appointments  Date Time Provider [place]  07/19/2020 10:30 AM Anyiah Coverdale, Melanie Crazier, MD [facility] None

## 2020-07-19 NOTE — Patient Instructions (Signed)
We recommend that you follow these hyperthyroidism instructions at home:  1) Take Methimazole  If you develop severe sore throat with high fevers OR develop unexplained yellowing of your skin, eyes, under your tongue, severe abdominal pain with nausea or vomiting --> then please get evaluated immediately.  2) Get repeat thyroid labs 6 weeks .  It is ESSENTIAL to get follow-up labs to help avoid over or undertreatment of your hyperthyroidism - both of which can be dangerous to your health.

## 2020-07-22 LAB — TRAB (TSH RECEPTOR BINDING ANTIBODY): TRAB: 2.49 IU/L — ABNORMAL HIGH (ref <=2.00)

## 2020-07-22 LAB — T3: T3, Total: 100 ng/dL (ref 76–181)

## 2020-08-02 DIAGNOSIS — U071 COVID-19: Secondary | ICD-10-CM | POA: Diagnosis not present

## 2020-08-02 DIAGNOSIS — J069 Acute upper respiratory infection, unspecified: Secondary | ICD-10-CM | POA: Diagnosis not present

## 2020-09-05 ENCOUNTER — Other Ambulatory Visit: Payer: Self-pay

## 2020-09-05 ENCOUNTER — Other Ambulatory Visit (INDEPENDENT_AMBULATORY_CARE_PROVIDER_SITE_OTHER): Payer: Medicare HMO

## 2020-09-05 DIAGNOSIS — E0590 Thyrotoxicosis, unspecified without thyrotoxic crisis or storm: Secondary | ICD-10-CM

## 2020-09-05 LAB — COMPREHENSIVE METABOLIC PANEL
ALT: 17 U/L (ref 0–53)
AST: 19 U/L (ref 0–37)
Albumin: 4.2 g/dL (ref 3.5–5.2)
Alkaline Phosphatase: 63 U/L (ref 39–117)
BUN: 25 mg/dL — ABNORMAL HIGH (ref 6–23)
CO2: 27 mEq/L (ref 19–32)
Calcium: 9.7 mg/dL (ref 8.4–10.5)
Chloride: 103 mEq/L (ref 96–112)
Creatinine, Ser: 1.21 mg/dL (ref 0.40–1.50)
GFR: 56.19 mL/min — ABNORMAL LOW (ref >60.00)
Glucose, Bld: 107 mg/dL — ABNORMAL HIGH (ref 70–99)
Potassium: 4.5 mEq/L (ref 3.5–5.1)
Sodium: 137 mEq/L (ref 135–145)
Total Bilirubin: 0.8 mg/dL (ref 0.2–1.2)
Total Protein: 7.2 g/dL (ref 6.0–8.3)

## 2020-09-05 LAB — TSH: TSH: 4.15 u[IU]/mL (ref 0.35–5.50)

## 2020-09-05 LAB — T4, FREE: Free T4: 0.59 ng/dL — ABNORMAL LOW (ref 0.60–1.60)

## 2020-09-07 ENCOUNTER — Telehealth: Payer: Self-pay | Admitting: Internal Medicine

## 2020-09-07 MED ORDER — METHIMAZOLE 5 MG PO TABS: 2.5000 mg | ORAL_TABLET | Freq: Every day | ORAL | 1 refills | Status: DC

## 2020-09-07 NOTE — Telephone Encounter (Signed)
Please let the patient know that his thyroid levels are getting better and to decrease the methimazole to half a tablet daily   Thanks   Abby Nena Jordan, MD  [facility] Endocrin[facility]  [facility] [address] [place], [place] [address] Phone: [phone] FAX: [phone]

## 2020-09-08 NOTE — Telephone Encounter (Signed)
Called and spoke with pt informed him that he is lab results and also advised pt of dose change. Pt understood without any concerns.

## 2020-09-21 DIAGNOSIS — C61 Malignant neoplasm of prostate: Secondary | ICD-10-CM | POA: Diagnosis not present

## 2020-09-26 ENCOUNTER — Other Ambulatory Visit: Payer: Self-pay | Admitting: Adult Health

## 2020-09-26 ENCOUNTER — Other Ambulatory Visit: Payer: Self-pay | Admitting: Cardiovascular Disease

## 2020-09-28 DIAGNOSIS — N5201 Erectile dysfunction due to arterial insufficiency: Secondary | ICD-10-CM | POA: Diagnosis not present

## 2020-09-28 DIAGNOSIS — Z8546 Personal history of malignant neoplasm of prostate: Secondary | ICD-10-CM | POA: Diagnosis not present

## 2020-09-28 DIAGNOSIS — R350 Frequency of micturition: Secondary | ICD-10-CM | POA: Diagnosis not present

## 2020-10-20 ENCOUNTER — Ambulatory Visit (INDEPENDENT_AMBULATORY_CARE_PROVIDER_SITE_OTHER): Payer: Medicare HMO | Admitting: Internal Medicine

## 2020-10-20 ENCOUNTER — Other Ambulatory Visit: Payer: Self-pay

## 2020-10-20 VITALS — BP 126/74 | HR 74 | Ht 72.0 in | Wt 169.8 lb

## 2020-10-20 DIAGNOSIS — E0590 Thyrotoxicosis, unspecified without thyrotoxic crisis or storm: Secondary | ICD-10-CM | POA: Diagnosis not present

## 2020-10-20 NOTE — Progress Notes (Signed)
Name: [name] [name]  MRN/ DOB: [id number], [birth year]    Age/ Sex: 80 y.o., male     PCP: [name], MD   Reason for Endocrinology Evaluation: Subclinical Hyperthyroidism     Initial Endocrinology Clinic Visit: 07/19/2020    PATIENT IDENTIFIER: [name] [name] is a 80 y.o., male with a past medical history of .HTN, CAD (S/P CABG), S/P Aortic valve replacement, Hx of post-op A.Fib He has followed with [facility] since 07/19/2020 for consultative assistance with management of his Subclinical Hyperthyroidism.   HISTORICAL SUMMARY:   During cardiac follow up and c/o fatigue he was noted to have low TSH at 0.027 uIU/mL  No Amiodarone use   TRab slightly elevated at 2.49 IU/L  Started methimazole 06/2020 due to extensive cardiac history    No Fh of thyroid issues  SUBJECTIVE:    Today (10/20/2020):  Mr. [name] is here for Subclinical Hyperthyroidism.   Denies abdominal pain or vomiting  Denies palpitations  Denies local neck swelling , but has occasional dysphagia    Methimazole 5 mg half a tablet daily    HISTORY:  Past Medical History:  Past Medical History:  Diagnosis Date   Arthritis    knee   Cataracts, bilateral    Chronic kidney disease    stage 2   Fatigue 04/05/2020   GERD (gastroesophageal reflux disease)    Glaucoma    Hearing loss    wears hearing aids   History of kidney stones    passed stones and 1 time had surgery   Hyperlipidemia    Hypertension    Inguinal hernia 03/2019   Pure hypercholesterolemia 03/23/2019   S/P aortic valve replacement with bioprosthetic valve 03/23/2019   Bioprosthetic 09/2014   Shingles    Varicose veins 03/31/2015   Wears partial dentures    upper   Past Surgical History:  Past Surgical History:  Procedure Laterality Date   AORTIC VALVE REPLACEMENT N/A 09/28/2014   Procedure: AORTIC VALVE REPLACEMENT (AVR);  Surgeon: [name], MD;  Location: [place];  Service: Open Heart Surgery;   Laterality: N/A;   CARDIAC CATHETERIZATION N/A 09/23/2014   Procedure: Left Heart Cath and Coronary Angiography;  Surgeon: [name], MD;  Location: [place] [facility];  Service: Cardiovascular;  Laterality: N/A;   CATARACT EXTRACTION     CHOLECYSTECTOMY N/A 10/28/2016   Procedure: LAPAROSCOPIC CHOLECYSTECTOMY;  Surgeon: Ralene Ok, MD;  Location: [place];  Service: General;  Laterality: N/A;   COLONOSCOPY     CORONARY ARTERY BYPASS GRAFT N/A 09/28/2014   Procedure: CORONARY ARTERY BYPASS GRAFTING (CABG) x4 using left internal mammory artery and right saphenous leg vein harvested endoscopically.;  Surgeon: [name], MD;  Location: [place];  Service: Open Heart Surgery;  Laterality: N/A;   INGUINAL HERNIA REPAIR Right 04/12/2019   Procedure: LAPAROSCOPIC RIGHT INGUINAL HERNIA REPAIR WITH MESH;  Surgeon: Ralene Ok, MD;  Location: [place];  Service: General;  Laterality: Right;   kidney stone removal     PROSTATECTOMY     TEE WITHOUT CARDIOVERSION N/A 09/28/2014   Procedure: TRANSESOPHAGEAL ECHOCARDIOGRAM (TEE);  Surgeon: [name], MD;  Location: [facility] [place];  Service: Open Heart Surgery;  Laterality: N/A;   TONSILLECTOMY     WISDOM TOOTH EXTRACTION     Social History:  reports that he quit smoking about 40 years ago. His smoking use included cigarettes. He has never used smokeless tobacco. He reports current alcohol use. He reports that  he does not use drugs. Family History:  Family History  Problem Relation Age of Onset   Cancer Mother    Ovarian cancer Mother      HOME MEDICATIONS: Allergies as of 10/20/2020       Reactions   Hydrochlorothiazide Swelling        Medication List        Accurate as of October 20, 2020  1:03 PM. If you have any questions, ask your nurse or doctor.          acetaminophen 500 MG tablet Commonly known as: TYLENOL Take 1,000 mg by mouth every 6 (six) hours as needed for headache (pain).   acyclovir 800 MG  tablet Commonly known as: ZOVIRAX Take 400 mg by mouth in the morning and at bedtime.   aspirin EC 81 MG tablet Take 81 mg by mouth at bedtime. Swallow whole.   atorvastatin 40 MG tablet Commonly known as: LIPITOR TAKE 1 TABLET AT BEDTIME   carvedilol 6.25 MG tablet Commonly known as: COREG TAKE 1 TABLET TWICE DAILY   esomeprazole 20 MG capsule Commonly known as: NEXIUM Take 20 mg by mouth every other day. Reported on 03/01/2015   lisinopril 20 MG tablet Commonly known as: ZESTRIL TAKE 1 TABLET TWICE DAILY   meloxicam 15 MG tablet Commonly known as: MOBIC Take 15 mg by mouth at bedtime.   methimazole 5 MG tablet Commonly known as: TAPAZOLE Take 0.5 tablets (2.5 mg total) by mouth daily.   multivitamin with minerals tablet Take 1 tablet by mouth daily.   oxybutynin 5 MG tablet Commonly known as: DITROPAN Take 5 mg by mouth 2 (two) times daily.   prednisoLONE acetate 1 % ophthalmic suspension Commonly known as: PRED FORTE Place 1 drop into the left eye in the morning, at noon, and at bedtime.   spironolactone 25 MG tablet Commonly known as: ALDACTONE Take 12.5 mg by mouth as directed. Take 1/2 tablet daily   timolol 0.5 % ophthalmic solution Commonly known as: TIMOPTIC Place 1 drop into the left eye 2 (two) times daily.          OBJECTIVE:   PHYSICAL EXAM: VS: BP 126/74 (BP Location: Left Arm, Patient Position: Sitting, Cuff Size: Small)   Pulse 74   Ht 6' (1.829 m)   Wt 169 lb 12.8 oz (77 kg)   SpO2 98%   BMI 23.03 kg/m    EXAM: General: Pt appears well and is in NAD  Neck: General: Supple without adenopathy. Thyroid: Thyroid size normal.  No goiter or nodules appreciated.  Lungs: Clear with good BS bilat with no rales, rhonchi, or wheezes  Heart: Auscultation: RRR.  Abdomen: Normoactive bowel sounds, soft, nontender, without masses or organomegaly palpable  Extremities:  BL LE: No pretibial edema normal ROM and strength.  Mental Status:  Judgment, insight: Intact Orientation: Oriented to time, place, and person  Mood and affect: No depression, anxiety, or agitation     DATA REVIEWED: Results for LONZY, MATO" (MRN [id number]) as of 10/23/2020 08:17  Ref. Range 10/20/2020 11:45  TSH Latest Ref Range: 0.40 - 4.50 mIU/L 3.41  T4,Free(Direct) Latest Ref Range: 0.8 - 1.8 ng/dL 0.9    Results for IBROHIM, SIMMERS" (MRN [id number]) as of 10/20/2020 07:55  Ref. Range 07/19/2020 11:12  TRAB Latest Ref Range: <=2.00 IU/L 2.49 (H)    ASSESSMENT / PLAN / RECOMMENDATIONS:   Hyperthyroidism :    - Slight elevation is TRAb consistent with Graves' Disease  -  We will proceed with thyroid ultrasound due to occasional dysphagia -He is tolerating methimazole without side effects -Patient is clinically euthyroid - TFT's normal , no change    Medications   Continue Methimazole 5 mg, half a tablet daily  Follow-up in 4 months Signed electronically by: Mack Guise, MD  [facility] Endocrin[facility]  [place] [facility] [address] [place], [place] [address] Phone: [phone] FAX: [phone]      CC: [name], MD [address] [place] [address] Phone: [phone]  Fax: [phone]   Return to Endocrinology clinic as below: Future Appointments  Date Time Provider [place]  11/27/2020  2:00 PM [name], MD [facility]  02/19/2021 11:10 AM Jereline Ticer, Melanie Crazier, MD [facility] None

## 2020-10-21 LAB — T4, FREE: Free T4: 0.9 ng/dL (ref 0.8–1.8)

## 2020-10-21 LAB — TSH: TSH: 3.41 mIU/L (ref 0.40–4.50)

## 2020-10-23 ENCOUNTER — Telehealth: Payer: Self-pay | Admitting: Internal Medicine

## 2020-10-23 MED ORDER — METHIMAZOLE 5 MG PO TABS: 2.5000 mg | ORAL_TABLET | Freq: Every day | ORAL | 1 refills | Status: DC

## 2020-10-23 NOTE — Telephone Encounter (Signed)
Please let him know that his thyroid is normal and to continue current dose of methimazole      Thanks    Abby Nena Jordan, MD  [facility] End[facility]in[facility]  [facility] [address] [place], [place] [address] Phone: [phone] FAX: [phone]

## 2020-10-23 NOTE — Telephone Encounter (Signed)
Patient notified and will continue medication

## 2020-10-24 ENCOUNTER — Ambulatory Visit
Admission: RE | Admit: 2020-10-24 | Discharge: 2020-10-24 | Disposition: A | Discharge status: Home / Self Care | Payer: Medicare HMO | Source: Ambulatory Visit | Attending: Internal Medicine | Admitting: Internal Medicine

## 2020-10-24 DIAGNOSIS — M1712 Unilateral primary osteoarthritis, left knee: Secondary | ICD-10-CM | POA: Diagnosis not present

## 2020-10-24 DIAGNOSIS — E0590 Thyrotoxicosis, unspecified without thyrotoxic crisis or storm: Secondary | ICD-10-CM | POA: Diagnosis not present

## 2020-10-25 ENCOUNTER — Encounter: Payer: Self-pay | Admitting: Internal Medicine

## 2020-10-30 ENCOUNTER — Telehealth: Payer: Self-pay | Admitting: Internal Medicine

## 2020-10-30 NOTE — Telephone Encounter (Signed)
Patient wife [name] [name] notified that US showed no nodules

## 2020-10-30 NOTE — Telephone Encounter (Signed)
Patient called to follow up on results from  Thyroid US doe 10/24/2020  Can call patient on home [phone] and then [phone] cell if no answer

## 2020-11-04 DIAGNOSIS — Z23 Encounter for immunization: Secondary | ICD-10-CM | POA: Diagnosis not present

## 2020-11-27 ENCOUNTER — Other Ambulatory Visit: Payer: Self-pay

## 2020-11-27 ENCOUNTER — Encounter (HOSPITAL_BASED_OUTPATIENT_CLINIC_OR_DEPARTMENT_OTHER): Payer: Self-pay | Admitting: Cardiovascular Disease

## 2020-11-27 ENCOUNTER — Ambulatory Visit (HOSPITAL_BASED_OUTPATIENT_CLINIC_OR_DEPARTMENT_OTHER): Payer: Medicare HMO | Admitting: Cardiovascular Disease

## 2020-11-27 ENCOUNTER — Encounter: Payer: Self-pay | Admitting: *Deleted

## 2020-11-27 VITALS — BP 132/86 | HR 65 | Ht 73.0 in | Wt 167.2 lb

## 2020-11-27 DIAGNOSIS — Z01810 Encounter for preprocedural cardiovascular examination: Secondary | ICD-10-CM

## 2020-11-27 DIAGNOSIS — Z951 Presence of aortocoronary bypass graft: Secondary | ICD-10-CM | POA: Diagnosis not present

## 2020-11-27 DIAGNOSIS — I25110 Atherosclerotic heart disease of native coronary artery with unstable angina pectoris: Secondary | ICD-10-CM

## 2020-11-27 DIAGNOSIS — I10 Essential (primary) hypertension: Secondary | ICD-10-CM

## 2020-11-27 DIAGNOSIS — E7800 Pure hypercholesterolemia, unspecified: Secondary | ICD-10-CM

## 2020-11-27 DIAGNOSIS — I119 Hypertensive heart disease without heart failure: Secondary | ICD-10-CM

## 2020-11-27 DIAGNOSIS — Z01818 Encounter for other preprocedural examination: Secondary | ICD-10-CM

## 2020-11-27 DIAGNOSIS — Z953 Presence of xenogenic heart valve: Secondary | ICD-10-CM

## 2020-11-27 DIAGNOSIS — R001 Bradycardia, unspecified: Secondary | ICD-10-CM

## 2020-11-27 NOTE — Patient Instructions (Signed)
Medication Instructions:  Your physician recommends that you continue on your current medications as directed. Please refer to the Current Medication list given to you today.   *If you need a refill on your cardiac medications before your next appointment, please call your pharmacy*  Lab Work: NONE  Testing/Procedures: NONE  Follow-Up: At [facility], you and your health needs are our priority.  As part of our continuing mission to provide you with exceptional heart care, we have created designated Provider Care Teams.  These Care Teams include your primary Cardiologist (physician) and Advanced Practice Providers (APPs -  Physician Assistants and Nurse Practitioners) who all work together to provide you with the care you need, when you need it.  We recommend signing up for the patient portal called "MyChart".  Sign up information is provided on this After Visit Summary.  MyChart is used to connect with patients for Virtual Visits (Telemedicine).  Patients are able to view lab/test results, encounter notes, upcoming appointments, etc.  Non-urgent messages can be sent to your provider as well.   To learn more about what you can do with MyChart, go to [email or web address].    Your next appointment:   6 month(s)  The format for your next appointment:   In Person  Provider:   [name], MD    Other Instructions  [place]

## 2020-11-27 NOTE — Progress Notes (Signed)
Cardiology office note   Date:  11/27/2020   ID:  [name] [name], DOB 16-Mar-1940, MRN [id number]   PCP:  [name], MD  Cardiologist:  [name], MD  Electrophysiologist:  None   Evaluation Performed:  Follow-Up Visit  Chief Complaint:  Hypertension, edema  History of Present Illness:     [name] [name] is a 80 y.o. male with hypertension, CAD s/p CABG (LIMA-->LAD, SVG-->RI/OM1, SVG-->PDA), aortic stenosis s/p AVR, bradycardia, and syncope who presents for follow up.  [name] [name] underwent 4 vessel CABG and AVR (25 mm [facility] Ease pericardial tissue valve) on 09/28/14.  His post-operative course was complicated by atrial fibrillation/flutter, but he was discharged in sinus rhythm.  He had an episode of lightheadedness in 08/2015 and was seen in the ED.  He was noted to have acute renal failure and was felt to be dehydrated. He had been working in the yard for several hours the day before and was not well hydrated.  Metoprolol was switched to carvedilol due to bradycardia and hypertension.  He was also started on amlodipine, so simvastatin was switched to atorvastatin.   [name] [name] has some right lower extremity edema that is chronic.  He had lower extremity Dopplers 03/2018 that were negative for DVT.  His PCP recommended a compression stocking which does seem to help when he wears it. His blood pressure was elevated so amlodipine was increased.  However, [name] [name] had increased lower extremity edema.  Therefore his dose was reduced and he was started on spironolactone.  Since then his BP has been much better.  Yesterday he had an episode of syncope.  His blood pressure dropped as low as 86/45.  He had been working outside vigorously and had not had much to eat or drink.  Recommended that he hold his spironolactone and check his blood pressure.  He recalls feeling nauseous.  He got up to go to the bathroom and had to sit down because he thought he would pass out.  He sat down on his  bed and when stood back out he passed out.  There was no chest pain or shortness of breath.  He broke out in a cold sweat.    [name] [name] had episodes of hypotension and syncope.  This occurred in the setting of not eating and drinking well and weight loss.  His symptoms were thought to be orthostatic.  Amlodipine was discontinued.  He followed up with [name], DNP 02/2020 and was not orthostatic in the office though he did continue to report positional lightheadedness. At his last appointment [name] [name] noticed a reduction in his exercise capacity.  He was referred for an echo 04/2020 that revealed LVEF 55%.  He had grade 1 diastolic dysfunction and his bioprosthetic aortic valve was functioning well.  The mean gradient was 18 mmHg.  He also had a nuclear stress test with LVEF 63% and no ischemia.  Spironolactone was reduced due to labile blood pressures  He follow-up with [name] in the next month and was stable but continued to report fatigue.  Lately he has been feeling well.  His only complaint has been pain in his left knee.  He thinks he is going to go ahead and have it replaced.  He has not had any exertional chest pain or shortness of breath.  He denies lower extremity edema, orthopnea, or PND.  He brings a log of his blood pressure showing that they have been mostly controlled.  At  times it has been as low as the 100s and as high as the 150s, but on average it is in the 412I to 786V systolic.  He has not had any more problems with lightheadedness.   Past Medical History:  Diagnosis Date   Arthritis    knee   Cataracts, bilateral    Chronic kidney disease    stage 2   Fatigue 04/05/2020   GERD (gastroesophageal reflux disease)    Glaucoma    Hearing loss    wears hearing aids   History of kidney stones    passed stones and 1 time had surgery   Hyperlipidemia    Hypertension    Inguinal hernia 03/2019   Pure hypercholesterolemia 03/23/2019   S/P aortic valve replacement with  bioprosthetic valve 03/23/2019   Bioprosthetic 09/2014   Shingles    Varicose veins 03/31/2015   Wears partial dentures    upper    Past Surgical History:  Procedure Laterality Date   AORTIC VALVE REPLACEMENT N/A 09/28/2014   Procedure: AORTIC VALVE REPLACEMENT (AVR);  Surgeon: [name], MD;  Location: [place];  Service: Open Heart Surgery;  Laterality: N/A;   CARDIAC CATHETERIZATION N/A 09/23/2014   Procedure: Left Heart Cath and Coronary Angiography;  Surgeon: [name], MD;  Location: [place] [facility];  Service: Cardiovascular;  Laterality: N/A;   CATARACT EXTRACTION     CHOLECYSTECTOMY N/A 10/28/2016   Procedure: LAPAROSCOPIC CHOLECYSTECTOMY;  Surgeon: Ralene Ok, MD;  Location: [place];  Service: General;  Laterality: N/A;   COLONOSCOPY     CORONARY ARTERY BYPASS GRAFT N/A 09/28/2014   Procedure: CORONARY ARTERY BYPASS GRAFTING (CABG) x4 using left internal mammory artery and right saphenous leg vein harvested endoscopically.;  Surgeon: [name], MD;  Location: [place];  Service: Open Heart Surgery;  Laterality: N/A;   INGUINAL HERNIA REPAIR Right 04/12/2019   Procedure: LAPAROSCOPIC RIGHT INGUINAL HERNIA REPAIR WITH MESH;  Surgeon: Ralene Ok, MD;  Location: [place];  Service: General;  Laterality: Right;   kidney stone removal     PROSTATECTOMY     TEE WITHOUT CARDIOVERSION N/A 09/28/2014   Procedure: TRANSESOPHAGEAL ECHOCARDIOGRAM (TEE);  Surgeon: [name], MD;  Location: [place];  Service: Open Heart Surgery;  Laterality: N/A;   TONSILLECTOMY     WISDOM TOOTH EXTRACTION       Current Outpatient Medications  Medication Sig Dispense Refill   acetaminophen (TYLENOL) 500 MG tablet Take 1,000 mg by mouth every 6 (six) hours as needed for headache (pain).      acyclovir (ZOVIRAX) 800 MG tablet Take 400 mg by mouth in the morning and at bedtime.     aspirin EC 81 MG tablet Take 81 mg by mouth at bedtime. Swallow whole.     atorvastatin (LIPITOR)  40 MG tablet TAKE 1 TABLET AT BEDTIME 90 tablet 3   carvedilol (COREG) 6.25 MG tablet TAKE 1 TABLET TWICE DAILY 180 tablet 2   esomeprazole (NEXIUM) 20 MG capsule Take 20 mg by mouth every other day. Reported on 03/01/2015     lisinopril (ZESTRIL) 20 MG tablet TAKE 1 TABLET TWICE DAILY 180 tablet 1   meloxicam (MOBIC) 15 MG tablet Take 15 mg by mouth at bedtime.      methimazole (TAPAZOLE) 5 MG tablet Take 0.5 tablets (2.5 mg total) by mouth daily. 45 tablet 1   Multiple Vitamins-Minerals (MULTIVITAMIN WITH MINERALS) tablet Take 1 tablet by mouth daily.     oxybutynin (DITROPAN) 5  MG tablet Take 5 mg by mouth 2 (two) times daily.      prednisoLONE acetate (PRED FORTE) 1 % ophthalmic suspension Place 1 drop into the left eye in the morning, at noon, and at bedtime.      spironolactone (ALDACTONE) 25 MG tablet Take 12.5 mg by mouth as directed. Take 1/2 tablet daily     timolol (TIMOPTIC) 0.5 % ophthalmic solution Place 1 drop into the left eye 2 (two) times daily.     No current facility-administered medications for this visit.    Allergies:   Hydrochlorothiazide    Social History:  The patient  reports that he quit smoking about 40 years ago. His smoking use included cigarettes. He has never used smokeless tobacco. He reports current alcohol use. He reports that he does not use drugs.   Family History:  The patient's family history includes Cancer in his mother; Ovarian cancer in his mother.    ROS:  Please see the history of present illness.   Otherwise, review of systems are positive for none.   All other systems are reviewed and negative.    PHYSICAL EXAM: VS:  BP 132/86 (BP Location: Left Arm, Patient Position: Sitting, Cuff Size: Normal)   Pulse 65   Ht 6\' 1"  (1.854 m)   Wt 167 lb 3.2 oz (75.8 kg)   BMI 22.06 kg/m  , BMI Body mass index is 22.06 kg/m. GENERAL:  Well appearing HEENT: Pupils equal round and reactive, fundi not visualized, oral mucosa unremarkable NECK:  No  jugular venous distention, waveform within normal limits, carotid upstroke brisk and symmetric, no bruits LUNGS:  Clear to auscultation bilaterally HEART:  RRR.  PMI not displaced or sustained,S1 and S2 within normal limits, no S3, no S4, no clicks, no rubs, III/VI  murmurs ABD:  Flat, positive bowel sounds normal in frequency in pitch, no bruits, no rebound, no guarding, no midline pulsatile mass, no hepatomegaly, no splenomegaly EXT:  2 plus pulses throughout, no edema, no cyanosis no clubbing SKIN:  No rashes no nodules NEURO:  Cranial nerves II through XII grossly intact, motor grossly intact throughout PSYCH:  Cognitively intact, oriented to person place and time   Recent Labs: 05/30/2020: Hemoglobin 14.4; Platelets 219 09/05/2020: ALT 17; BUN 25; Creatinine, Ser 1.21; Potassium 4.5; Sodium 137 10/20/2020: TSH 3.41   11/25/2017: Total cholesterol 114, triglycerides 91, HDL 40, LDL 55 Potassium 4.9, creatinine 0.9  Lipid Panel No results found for: CHOL, TRIG, HDL, CHOLHDL, VLDL, LDLCALC, LDLDIRECT    Wt Readings from Last 3 Encounters:  11/27/20 167 lb 3.2 oz (75.8 kg)  10/20/20 169 lb 12.8 oz (77 kg)  07/19/20 164 lb (74.4 kg)    EKG 03/21/17: Sinus rhythm.  Rate 60 bpm.  Left axis deviation.  03/23/18: Sinus bradycardia.  Rate 54 bpm.  First degree AV block.  LAFB.   03/23/19: Sinus rhythm.  Rate 60 bpm.  Left axis deviation.  Prior anteroseptal infarct.  IVCD. 11/27/2020: Sinus rhythm.  Rate 65 bpm.  LAFB.  IVCD.  Cannot rule out prior anteroseptal infarct.  Echo 11/01/14: Study Conclusions  - Left ventricle: The cavity size was normal. Wall thickness was   increased in a pattern of moderate LVH. Systolic function was   normal. The estimated ejection fraction was in the range of 55%   to 60%. Wall motion was normal; there were no regional wall   motion abnormalities. - Aortic valve: Normal appearing tissue AVR with no peri valvular   regurgitation. -  Mitral valve: There was  mild regurgitation. - Left atrium: The atrium was mildly dilated. - Atrial septum: No defect or patent foramen ovale was identified.  ASSESSMENT AND PLAN:  # Syncope: #  Hypertensive heart disease:  No recurrent episodes.  Blood pressure remains somewhat labile but overall better controlled and stable.  He is not having any symptoms.  Continue current regimen of carvedilol and spironolactone.  # Hyperlipidemia:   Continue atorvastatin.  Lipids are well-controlled on atorvastatin.  # CAD: S/p CABG. Lexiscan Myoview was negative for ischemia 04/2020.  Continue aspirin, carvedilol, and atorvastatin.  # s/p AVR: [name] [name] has a bioprosthetic AVR and is doing well.  He has no chest pain, shortness of breath, or edema.  He needs antibiotic prophylaxis prior to dental procedures.  Mean gradient was 18 mmHg and his valve is functioning well 04/2020.  # LE Edema: No DVT on lower extremity Dopplers 03/2018.  Improved since reducing amlodipine and adding spironolactone  # Preoperative risk assessment: [name] [name] needs to have his knee replaced.  He has no exertional symptoms and had a negative stress test earlier this year.  He is at low risk for surgery.  Okay to hold aspirin as needed preoperatively.  Current medicines are reviewed at length with the patient today.  The patient does not have concerns regarding medicines.  The following changes have been made:  none  Labs/ tests ordered today include:   Orders Placed This Encounter  Procedures   EKG 12-Lead     Disposition:   FU with Amilah Greenspan C. Oval Linsey, MD in 6 months.   Signed, [name], MD  11/27/2020 10:52 PM    [place]

## 2020-12-13 DIAGNOSIS — M1712 Unilateral primary osteoarthritis, left knee: Secondary | ICD-10-CM | POA: Diagnosis not present

## 2020-12-26 DIAGNOSIS — H40021 Open angle with borderline findings, high risk, right eye: Secondary | ICD-10-CM | POA: Diagnosis not present

## 2020-12-26 DIAGNOSIS — H04122 Dry eye syndrome of left lacrimal gland: Secondary | ICD-10-CM | POA: Diagnosis not present

## 2020-12-26 DIAGNOSIS — H401123 Primary open-angle glaucoma, left eye, severe stage: Secondary | ICD-10-CM | POA: Diagnosis not present

## 2020-12-26 DIAGNOSIS — Z961 Presence of intraocular lens: Secondary | ICD-10-CM | POA: Diagnosis not present

## 2021-01-01 DIAGNOSIS — I1310 Hypertensive heart and chronic kidney disease without heart failure, with stage 1 through stage 4 chronic kidney disease, or unspecified chronic kidney disease: Secondary | ICD-10-CM | POA: Diagnosis not present

## 2021-01-01 DIAGNOSIS — N182 Chronic kidney disease, stage 2 (mild): Secondary | ICD-10-CM | POA: Diagnosis not present

## 2021-01-01 DIAGNOSIS — M1990 Unspecified osteoarthritis, unspecified site: Secondary | ICD-10-CM | POA: Diagnosis not present

## 2021-01-01 DIAGNOSIS — E7800 Pure hypercholesterolemia, unspecified: Secondary | ICD-10-CM | POA: Diagnosis not present

## 2021-01-01 DIAGNOSIS — I2510 Atherosclerotic heart disease of native coronary artery without angina pectoris: Secondary | ICD-10-CM | POA: Diagnosis not present

## 2021-01-01 DIAGNOSIS — E0500 Thyrotoxicosis with diffuse goiter without thyrotoxic crisis or storm: Secondary | ICD-10-CM | POA: Diagnosis not present

## 2021-02-08 DIAGNOSIS — M1712 Unilateral primary osteoarthritis, left knee: Secondary | ICD-10-CM | POA: Diagnosis not present

## 2021-02-19 ENCOUNTER — Ambulatory Visit: Payer: Medicare HMO | Admitting: Internal Medicine

## 2021-02-19 NOTE — Progress Notes (Signed)
Surgery orders requested via Epic inbox. °

## 2021-02-23 NOTE — Progress Notes (Addendum)
COVID swab appointment: 03-08-21 @ 9:00 AM  COVID Vaccine Completed:  Yes X2 Date COVID Vaccine completed: Has received booster:  Yes x1 COVID vaccine manufacturer: Pfizer      Date of COVID positive in last 90 days:  No  PCP - [name], MD (office note on chart) Cardiologist - [name], MD  Cardiac clearance in note dated 11-27-20 by [name], MD  Chest x-ray - N/A EKG - 11-27-20 Epic Stress Test - 05-03-20 Epic ECHO - 05-03-20 Epic Cardiac Cath - greater than 2 years Pacemaker/ICD device last checked: Spinal Cord Stimulator:  Bowel Prep - N/A  Sleep Study - N/A CPAP -   Fasting Blood Sugar - N/A Checks Blood Sugar _____ times a day  Blood Thinner Instructions: Aspirin Instructions:  ASA 81 mg.  Patient to stop 5 days before surgery Last Dose:  Activity level:   Can go up a flight of stairs and perform activities of daily living without stopping and without symptoms of chest pain or shortness of breath.    Anesthesia review:  Afib, CAD, HTN, murmur, CABG and aortic valve replacement, syncope  BP elevated at PAT but patient states that he monitors daily at home and it runs in the 130/80 range.  Denies headache, chest pain or SOB  Patient denies shortness of breath, fever, cough and chest pain at PAT appointment  Patient verbalized understanding of instructions that were given to them at the PAT appointment. Patient was also instructed that they will need to review over the PAT instructions again at home before surgery.

## 2021-02-23 NOTE — Patient Instructions (Addendum)
DUE TO COVID-19 ONLY ONE VISITOR IS ALLOWED TO COME WITH YOU AND STAY IN THE WAITING ROOM ONLY DURING PRE OP AND PROCEDURE.   **NO VISITORS ARE ALLOWED IN THE SHORT STAY AREA OR RECOVERY ROOM!!**  IF YOU WILL BE ADMITTED INTO THE HOSPITAL YOU ARE ALLOWED ONLY TWO SUPPORT PEOPLE DURING VISITATION HOURS ONLY (7 AM -8PM)    Up to two visitors ages 79+ are allowed at one time in a patient's room.  The visitors may rotate out with other people throughout the day.  Additionally, up to two children between the ages of 13 and 81 are allowed and do not count toward the number of allowed visitors.  Children within this age range must be accompanied by an adult visitor.  One adult visitor may remain with the patient overnight and must be in the room by 8 PM.  COVID SWAB TESTING MUST BE COMPLETED ON:  03-08-21 @ 9:00 AM   COME IN THROUGH MAIN ENTRANCE of [facility].  Take a seat in the lobby area to the right as you come in the main entrance.  Call [phone]  and give your name and let them know you are here for COVID testing  You are not required to quarantine, however you are required to wear a well-fitted mask when you are out and around people not in your household.  Hand Hygiene often Do NOT share personal items Notify your provider if you are in close contact with someone who has COVID or you develop fever 100.4 or greater, new onset of sneezing, cough, sore throat, shortness of breath or body aches.        Your procedure is scheduled on: Monday, 03-12-21   Report to [facility] Main  Entrance     Report to admitting at 8:45 AM   Call this number if you have problems the morning of surgery [phone]   Do not eat food :After Midnight.   May have liquids until  8:30 AM  day of surgery  CLEAR LIQUID DIET  Foods Allowed                                                                     Foods Excluded  Water, Black Coffee (no milk/no creamer) and tea, regular and decaf                               liquids that you cannot  Plain Jell-O in any flavor  (No red)                         see through such as: Fruit ices (not with fruit pulp)                                 milk, soups, orange juice  Iced Popsicles (No red)                                    All solid food  Apple juices Sports drinks like Gatorade (No red) Lightly seasoned clear broth or consume(fat free) Sugar    Complete one Ensure drink the morning of surgery at 8:30 AM the day of surgery.     The day of surgery:  Drink ONE (1) Pre-Surgery Clear Ensure the morning of surgery. Drink in one sitting. Do not sip.  This drink was given to you during your hospital  pre-op appointment visit. Nothing else to drink after completing the Pre-Surgery Clear Ensure          If you have questions, please contact your surgeons office.     Oral Hygiene is also important to reduce your risk of infection.                                    Remember - BRUSH YOUR TEETH THE MORNING OF SURGERY WITH YOUR REGULAR TOOTHPASTE   Do NOT smoke after Midnight  Take these medicines the morning of surgery with A SIP OF WATER: Tylenol, Acyclovir, Carvedilol, Nexium, Methimazole, Oxybutynin.  Okay to use eyedrops  Aspirin 81 - Stop 5 days before surgery   Stop all vitamins and herbal supplements a week before surgery  You may not have any metal on your body including  jewelry, and body piercing             Do not wear  lotions, powders, cologne, or deodorant              Men may shave face and neck.  Do not bring valuables to the hospital. [place].   Contacts, dentures or bridgework may not be worn into surgery.  Bring small overnight bag day of surgery.  Special Instructions: Bring a copy of your healthcare power of attorney and living will documents the day of surgery if you haven't scanned them in before.  Please read over the following fact sheets  you were given: IF YOU HAVE QUESTIONS ABOUT YOUR PRE OP INSTRUCTIONS PLEASE CALL [place] - Preparing for Surgery Before surgery, you can play an important role.  Because skin is not sterile, your skin needs to be as free of germs as possible.  You can reduce the number of germs on your skin by washing with CHG (chlorahexidine gluconate) soap before surgery.  CHG is an antiseptic cleaner which kills germs and bonds with the skin to continue killing germs even after washing. Please DO NOT use if you have an allergy to CHG or antibacterial soaps.  If your skin becomes reddened/irritated stop using the CHG and inform your nurse when you arrive at Short Stay. Do not shave (including legs and underarms) for at least 48 hours prior to the first CHG shower.  You may shave your face/neck.  Please follow these instructions carefully:  1.  Shower with CHG Soap the night before surgery and the  morning of surgery.  2.  If you choose to wash your hair, wash your hair first as usual with your normal  shampoo.  3.  After you shampoo, rinse your hair and body thoroughly to remove the shampoo.                             4.  Use CHG as you would any other liquid soap.  You can apply chg directly to the skin  and wash.  Gently with a scrungie or clean washcloth.  5.  Apply the CHG Soap to your body ONLY FROM THE NECK DOWN.   Do   not use on face/ open                           Wound or open sores. Avoid contact with eyes, ears mouth and   genitals (private parts).                       Wash face,  Genitals (private parts) with your normal soap.             6.  Wash thoroughly, paying special attention to the area where your    surgery  will be performed.  7.  Thoroughly rinse your body with warm water from the neck down.  8.  DO NOT shower/wash with your normal soap after using and rinsing off the CHG Soap.                9.  Pat yourself dry with a clean towel.            10.  Wear clean  pajamas.            11.  Place clean sheets on your bed the night of your first shower and do not  sleep with pets. Day of Surgery : Do not apply any lotions/deodorants the morning of surgery.  Please wear clean clothes to the hospital/surgery center.  FAILURE TO FOLLOW THESE INSTRUCTIONS MAY RESULT IN THE CANCELLATION OF YOUR SURGERY  PATIENT SIGNATURE_________________________________  NURSE SIGNATURE__________________________________  ________________________________________________________________________   Adam Phenix  An incentive spirometer is a tool that can help keep your lungs clear and active. This tool measures how well you are filling your lungs with each breath. Taking long deep breaths may help reverse or decrease the chance of developing breathing (pulmonary) problems (especially infection) following: A long period of time when you are unable to move or be active. BEFORE THE PROCEDURE  If the spirometer includes an indicator to show your best effort, your nurse or respiratory therapist will set it to a desired goal. If possible, sit up straight or lean slightly forward. Try not to slouch. Hold the incentive spirometer in an upright position. INSTRUCTIONS FOR USE  Sit on the edge of your bed if possible, or sit up as far as you can in bed or on a chair. Hold the incentive spirometer in an upright position. Breathe out normally. Place the mouthpiece in your mouth and seal your lips tightly around it. Breathe in slowly and as deeply as possible, raising the piston or the ball toward the top of the column. Hold your breath for 3-5 seconds or for as long as possible. Allow the piston or ball to fall to the bottom of the column. Remove the mouthpiece from your mouth and breathe out normally. Rest for a few seconds and repeat Steps 1 through 7 at least 10 times every 1-2 hours when you are awake. Take your time and take a few normal breaths between deep breaths. The  spirometer may include an indicator to show your best effort. Use the indicator as a goal to work toward during each repetition. After each set of 10 deep breaths, practice coughing to be sure your lungs are clear. If you have an incision (the cut made at the time of surgery), support your incision  when coughing by placing a pillow or rolled up towels firmly against it. Once you are able to get out of bed, walk around indoors and cough well. You may stop using the incentive spirometer when instructed by your caregiver.  RISKS AND COMPLICATIONS Take your time so you do not get dizzy or light-headed. If you are in pain, you may need to take or ask for pain medication before doing incentive spirometry. It is harder to take a deep breath if you are having pain. AFTER USE Rest and breathe slowly and easily. It can be helpful to keep track of a log of your progress. Your caregiver can provide you with a simple table to help with this. If you are using the spirometer at home, follow these instructions: [place] IF:  You are having difficultly using the spirometer. You have trouble using the spirometer as often as instructed. Your pain medication is not giving enough relief while using the spirometer. You develop fever of 100.5 F (38.1 C) or higher. SEEK IMMEDIATE MEDICAL CARE IF:  You cough up bloody sputum that had not been present before. You develop fever of 102 F (38.9 C) or greater. You develop worsening pain at or near the incision site. MAKE SURE YOU:  Understand these instructions. Will watch your condition. Will get help right away if you are not doing well or get worse. Document Released: 05/20/2006 Document Revised: 04/01/2011 Document Reviewed: 07/21/2006 [facility] Patient Information 2014 [place], [place].   ________________________________________________________________________

## 2021-02-27 ENCOUNTER — Encounter (HOSPITAL_COMMUNITY)
Admission: RE | Admit: 2021-02-27 | Discharge: 2021-02-27 | Disposition: A | Discharge status: Home / Self Care | Payer: Medicare HMO | Source: Ambulatory Visit | Attending: Orthopedic Surgery | Admitting: Orthopedic Surgery

## 2021-02-27 ENCOUNTER — Encounter (HOSPITAL_COMMUNITY): Payer: Self-pay

## 2021-02-27 ENCOUNTER — Other Ambulatory Visit: Payer: Self-pay

## 2021-02-27 VITALS — BP 161/101 | HR 62 | Temp 98.4°F | Resp 20 | Ht 73.0 in | Wt 165.2 lb

## 2021-02-27 DIAGNOSIS — M1712 Unilateral primary osteoarthritis, left knee: Secondary | ICD-10-CM | POA: Diagnosis not present

## 2021-02-27 DIAGNOSIS — Z01818 Encounter for other preprocedural examination: Secondary | ICD-10-CM

## 2021-02-27 DIAGNOSIS — Z01812 Encounter for preprocedural laboratory examination: Secondary | ICD-10-CM | POA: Insufficient documentation

## 2021-02-27 HISTORY — DX: Unspecified atrial fibrillation: I48.91

## 2021-02-27 HISTORY — DX: Malignant neoplasm of prostate: C61

## 2021-02-27 HISTORY — DX: Atherosclerotic heart disease of native coronary artery without angina pectoris: I25.10

## 2021-02-27 LAB — CBC
HCT: 44.3 % (ref 39.0–52.0)
Hemoglobin: 14.8 g/dL (ref 13.0–17.0)
MCH: 33.3 pg (ref 26.0–34.0)
MCHC: 33.4 g/dL (ref 30.0–36.0)
MCV: 99.8 fL (ref 80.0–100.0)
Platelets: 211 10*3/uL (ref 150–400)
RBC: 4.44 MIL/uL (ref 4.22–5.81)
RDW: 12 % (ref 11.5–15.5)
WBC: 7.4 10*3/uL (ref 4.0–10.5)
nRBC: 0 % (ref 0.0–0.2)

## 2021-02-27 LAB — COMPREHENSIVE METABOLIC PANEL
ALT: 24 U/L (ref 0–44)
AST: 26 U/L (ref 15–41)
Albumin: 4.2 g/dL (ref 3.5–5.0)
Alkaline Phosphatase: 61 U/L (ref 38–126)
Anion gap: 7 (ref 5–15)
BUN: 25 mg/dL — ABNORMAL HIGH (ref 8–23)
CO2: 27 mmol/L (ref 22–32)
Calcium: 9.7 mg/dL (ref 8.9–10.3)
Chloride: 103 mmol/L (ref 98–111)
Creatinine, Ser: 1 mg/dL (ref 0.61–1.24)
GFR, Estimated: >60 mL/min (ref >60)
Glucose, Bld: 104 mg/dL — ABNORMAL HIGH (ref 70–99)
Potassium: 4.7 mmol/L (ref 3.5–5.1)
Sodium: 137 mmol/L (ref 135–145)
Total Bilirubin: 0.8 mg/dL (ref 0.3–1.2)
Total Protein: 7.5 g/dL (ref 6.5–8.1)

## 2021-02-27 LAB — SURGICAL PCR SCREEN
MRSA, PCR: NEGATIVE
Staphylococcus aureus: NEGATIVE

## 2021-02-27 LAB — PROTIME-INR
INR: 1 (ref 0.8–1.2)
Prothrombin Time: 13.7 seconds (ref 11.4–15.2)

## 2021-02-27 LAB — TYPE AND SCREEN
ABO/RH(D): O POS
Antibody Screen: NEGATIVE

## 2021-03-02 ENCOUNTER — Encounter (HOSPITAL_BASED_OUTPATIENT_CLINIC_OR_DEPARTMENT_OTHER): Payer: Self-pay

## 2021-03-02 NOTE — Anesthesia Preprocedure Evaluation (Addendum)
Anesthesia Evaluation  Patient identified by MRN, date of birth, ID band Patient awake    Reviewed: Allergy & Precautions, NPO status , Patient's Chart, lab work & pertinent test results, reviewed documented beta blocker date and time   Airway Mallampati: II  TM Distance: >3 FB Neck ROM: Full    Dental  (+) Missing, Dental Advisory Given, Chipped, Partial Lower, Partial Upper   Pulmonary neg pulmonary ROS, former smoker,    Pulmonary exam normal breath sounds clear to auscultation       Cardiovascular hypertension, Pt. on home beta blockers and Pt. on medications + angina + CAD and + CABG  Normal cardiovascular exam+ dysrhythmias Atrial Fibrillation + Valvular Problems/Murmurs MR  Rhythm:Regular Rate:Normal  Echo 04/2020 1. Left ventricular ejection fraction, by estimation, is 55%. The left ventricle has normal function. The left ventricle has no regional wall motion abnormalities. There is mild left ventricular hypertrophy. Left ventricular diastolic parameters are consistent with Grade I diastolic dysfunction (impaired relaxation).  2. Right ventricular systolic function is normal. The right ventricular size is normal. There is normal pulmonary artery systolic pressure. The estimated right ventricular systolic pressure is 44.3 mmHg.  3. Left atrial size was mild to moderately dilated.  4. Right atrial size was mildly dilated.  5. The mitral valve is normal in structure. Mild mitral valve  regurgitation. No evidence of mitral stenosis.  6. Tricuspid valve regurgitation is mild to moderate.  7. Bioprosthetic aortic valve replacement. Mean gradient 18 mmHg. No significant regurgitation.  8. The inferior vena cava is normal in size with greater than 50% respiratory variability, suggesting right atrial pressure of 3 mmHg.    Neuro/Psych negative neurological ROS     GI/Hepatic Neg liver ROS, GERD  ,  Endo/Other  negative  endocrine ROS  Renal/GU Renal disease     Musculoskeletal  (+) Arthritis ,   Abdominal   Peds  Hematology negative hematology ROS (+)   Anesthesia Other Findings   Reproductive/Obstetrics                           Anesthesia Physical Anesthesia Plan  ASA: 3  Anesthesia Plan: Spinal   Post-op Pain Management: Regional block and Ofirmev IV (intra-op)   Induction: Intravenous  PONV Risk Score and Plan: 2 and Dexamethasone, Propofol infusion and Treatment may vary due to age or medical condition  Airway Management Planned: Natural Airway  Additional Equipment:   Intra-op Plan:   Post-operative Plan:   Informed Consent: I have reviewed the patients History and Physical, chart, labs and discussed the procedure including the risks, benefits and alternatives for the proposed anesthesia with the patient or authorized representative who has indicated his/her understanding and acceptance.     Dental advisory given  Plan Discussed with: CRNA  Anesthesia Plan Comments: (See PAT note 02/27/2021, Konrad Felix Ward, PA-C)      Anesthesia Quick Evaluation

## 2021-03-02 NOTE — Progress Notes (Signed)
Anesthesia Chart Review   Case: [id number] Date/Time: 03/12/21 1120   Procedure: TOTAL KNEE ARTHROPLASTY (Left: Knee)   Anesthesia type: Choice   Pre-op diagnosis: left knee osteoarthritis   Location: [facility] ROOM 09 / [facility]   Surgeons: Gaynelle Arabian, MD       DISCUSSION:81 y.o. former smoker with h/o HTN, GERD, A-fib, CAD s/p CABG (LIMA-->LAD, SVG-->RI/OM1, SVG-->PDA), s/p AVR, prostate cancer, left knee OA scheduled for above procedure 03/12/2021 with Dr. Gaynelle Arabian.   Pt last seen by cardiology 11/27/2020. Per OV note, "Preoperative risk assessment: Mr. Tatsch needs to have his knee replaced.  He has no exertional symptoms and had a negative stress test earlier this year.  He is at low risk for surgery.  Okay to hold aspirin as needed preoperatively."  Anticipate pt can proceed with planned procedure barring acute status change.   VS: BP (!) 161/101    Pulse 62    Temp 36.9 C (Oral)    Resp 20    Ht 6\' 1"  (1.854 m)    Wt 74.9 kg    SpO2 99%    BMI 21.80 kg/m   PROVIDERS: [name], MD is PCP   [name], MD is Cardiologist  LABS: Labs reviewed: Acceptable for surgery. (all labs ordered are listed, but only abnormal results are displayed)  Labs Reviewed  COMPREHENSIVE METABOLIC PANEL - Abnormal; Notable for the following components:      Result Value   Glucose, Bld 104 (*)    BUN 25 (*)    All other components within normal limits  SURGICAL PCR SCREEN  CBC  PROTIME-INR  TYPE AND SCREEN     IMAGES:   EKG: 11/27/2020 Rate 65 bpm  NSR Nonspecific intraventricular block  Minimal voltage criteria for LVH, may be normal variant Cannot rule out anteroseptal infarct, age undetermined   CV: Echo 05/03/2020  1. Left ventricular ejection fraction, by estimation, is 55%. The left  ventricle has normal function. The left ventricle has no regional wall  motion abnormalities. There is mild left ventricular hypertrophy. Left  ventricular diastolic parameters are   consistent with Grade I diastolic dysfunction (impaired relaxation).   2. Right ventricular systolic function is normal. The right ventricular  size is normal. There is normal pulmonary artery systolic pressure. The  estimated right ventricular systolic pressure is 82.5 mmHg.   3. Left atrial size was mild to moderately dilated.   4. Right atrial size was mildly dilated.   5. The mitral valve is normal in structure. Mild mitral valve  regurgitation. No evidence of mitral stenosis.   6. Tricuspid valve regurgitation is mild to moderate.   7. Bioprosthetic aortic valve replacement. Mean gradient 18 mmHg. No  significant regurgitation.   8. The inferior vena cava is normal in size with greater than 50%  respiratory variability, suggesting right atrial pressure of 3 mmHg.   Myocardial Perfusion 05/03/2020 The left ventricular ejection fraction is normal (55-65%). Nuclear stress EF: 63%. There was no ST segment deviation noted during stress. This is a low risk study.   There is a small, moderate, reversible defect in the inferoapex.  Wall motion is normal in this region.  There is significant extracardiac bowel tracer uptake adjacent to the inferoapex with stress on both supine and upright stress images.  This finding likely represents attenuation artifact from extracardiac tracer uptake, but cannot exclude a small area of ischemia.   Overall study is low risk.   Past Medical History:  Diagnosis Date  A-fib ([place])    Arthritis    knee   Cataracts, bilateral    Chronic kidney disease    stage 2   Coronary artery disease    Fatigue 04/05/2020   GERD (gastroesophageal reflux disease)    Glaucoma    Hearing loss    wears hearing aids   History of kidney stones    passed stones and 1 time had surgery   Hyperlipidemia    Hypertension    Inguinal hernia 03/2019   Prostate cancer ([place])    Pure hypercholesterolemia 03/23/2019   S/P aortic valve replacement with bioprosthetic valve  03/23/2019   Bioprosthetic 09/2014   Shingles    Varicose veins 03/31/2015   Wears partial dentures    upper    Past Surgical History:  Procedure Laterality Date   AORTIC VALVE REPLACEMENT N/A 09/28/2014   Procedure: AORTIC VALVE REPLACEMENT (AVR);  Surgeon: [name], MD;  Location: [place];  Service: Open Heart Surgery;  Laterality: N/A;   CARDIAC CATHETERIZATION N/A 09/23/2014   Procedure: Left Heart Cath and Coronary Angiography;  Surgeon: [name], MD;  Location: [place] [facility];  Service: Cardiovascular;  Laterality: N/A;   CATARACT EXTRACTION     CHOLECYSTECTOMY N/A 10/28/2016   Procedure: LAPAROSCOPIC CHOLECYSTECTOMY;  Surgeon: Ralene Ok, MD;  Location: [place];  Service: General;  Laterality: N/A;   COLONOSCOPY     CORONARY ARTERY BYPASS GRAFT N/A 09/28/2014   Procedure: CORONARY ARTERY BYPASS GRAFTING (CABG) x4 using left internal mammory artery and right saphenous leg vein harvested endoscopically.;  Surgeon: [name], MD;  Location: [place];  Service: Open Heart Surgery;  Laterality: N/A;   INGUINAL HERNIA REPAIR Right 04/12/2019   Procedure: LAPAROSCOPIC RIGHT INGUINAL HERNIA REPAIR WITH MESH;  Surgeon: Ralene Ok, MD;  Location: [place];  Service: General;  Laterality: Right;   kidney stone removal     PROSTATECTOMY     TEE WITHOUT CARDIOVERSION N/A 09/28/2014   Procedure: TRANSESOPHAGEAL ECHOCARDIOGRAM (TEE);  Surgeon: [name], MD;  Location: [place];  Service: Open Heart Surgery;  Laterality: N/A;   TONSILLECTOMY     WISDOM TOOTH EXTRACTION      MEDICATIONS:  acetaminophen (TYLENOL) 500 MG tablet   acyclovir (ZOVIRAX) 800 MG tablet   aspirin EC 81 MG tablet   atorvastatin (LIPITOR) 40 MG tablet   carvedilol (COREG) 6.25 MG tablet   esomeprazole (NEXIUM) 20 MG capsule   Lidocaine 4 % PTCH   lisinopril (ZESTRIL) 20 MG tablet   meloxicam (MOBIC) 15 MG tablet   methimazole (TAPAZOLE) 5 MG tablet   Multiple Vitamins-Minerals  (MULTIVITAMIN WITH MINERALS) tablet   oxybutynin (DITROPAN) 5 MG tablet   prednisoLONE acetate (PRED FORTE) 1 % ophthalmic suspension   spironolactone (ALDACTONE) 25 MG tablet   timolol (TIMOPTIC) 0.5 % ophthalmic solution   No current facility-administered medications for this encounter.    Konrad Felix Ward, PA-C [facility] [phone]

## 2021-03-05 ENCOUNTER — Encounter (HOSPITAL_BASED_OUTPATIENT_CLINIC_OR_DEPARTMENT_OTHER): Payer: Self-pay

## 2021-03-05 NOTE — H&P (Signed)
TOTAL KNEE ADMISSION H&P  Patient is being admitted for left total knee arthroplasty.  Subjective:  Chief Complaint: Left knee pain.  HPI: [name] [name], 81 y.o. male has a history of pain and functional disability in the left knee due to arthritis and has failed non-surgical conservative treatments for greater than 12 weeks to include NSAID's and/or analgesics and activity modification. Onset of symptoms was gradual, starting  several  years ago with gradually worsening course since that time. The patient noted no past surgery on the left knee.  Patient currently rates pain in the left knee at 7 out of 10 with activity. Patient has worsening of pain with activity and weight bearing, pain that interferes with activities of daily living, pain with passive range of motion, and crepitus. Patient has evidence of periarticular osteophytes and joint space narrowing by imaging studies. There is no active infection.  Patient Active Problem List   Diagnosis Date Noted   Fatigue 04/05/2020   Pure hypercholesterolemia 03/23/2019   S/P aortic valve replacement with bioprosthetic valve 03/23/2019   Acute cholecystitis 10/28/2016   Varicose veins of left lower extremity with complications 00/37/0488   Bradycardia 09/29/2014   S/P CABG x 4 09/28/2014   Cardiac murmur, previously undiagnosed 09/23/2014   Coronary artery disease involving native heart with unstable angina pectoris ([place]) 09/23/2014   Syncope 09/30/2013   Essential hypertension, benign 09/30/2013   GERD (gastroesophageal reflux disease) 09/30/2013    Past Medical History:  Diagnosis Date   A-fib ([place])    Arthritis    knee   Cataracts, bilateral    Chronic kidney disease    stage 2   Coronary artery disease    Fatigue 04/05/2020   GERD (gastroesophageal reflux disease)    Glaucoma    Hearing loss    wears hearing aids   History of kidney stones    passed stones and 1 time had surgery   Hyperlipidemia    Hypertension     Inguinal hernia 03/2019   Prostate cancer ([place])    Pure hypercholesterolemia 03/23/2019   S/P aortic valve replacement with bioprosthetic valve 03/23/2019   Bioprosthetic 09/2014   Shingles    Varicose veins 03/31/2015   Wears partial dentures    upper    Past Surgical History:  Procedure Laterality Date   AORTIC VALVE REPLACEMENT N/A 09/28/2014   Procedure: AORTIC VALVE REPLACEMENT (AVR);  Surgeon: [name], MD;  Location: [place];  Service: Open Heart Surgery;  Laterality: N/A;   CARDIAC CATHETERIZATION N/A 09/23/2014   Procedure: Left Heart Cath and Coronary Angiography;  Surgeon: [name], MD;  Location: [place] [facility];  Service: Cardiovascular;  Laterality: N/A;   CATARACT EXTRACTION     CHOLECYSTECTOMY N/A 10/28/2016   Procedure: LAPAROSCOPIC CHOLECYSTECTOMY;  Surgeon: Ralene Ok, MD;  Location: [place];  Service: General;  Laterality: N/A;   COLONOSCOPY     CORONARY ARTERY BYPASS GRAFT N/A 09/28/2014   Procedure: CORONARY ARTERY BYPASS GRAFTING (CABG) x4 using left internal mammory artery and right saphenous leg vein harvested endoscopically.;  Surgeon: [name], MD;  Location: [place];  Service: Open Heart Surgery;  Laterality: N/A;   INGUINAL HERNIA REPAIR Right 04/12/2019   Procedure: LAPAROSCOPIC RIGHT INGUINAL HERNIA REPAIR WITH MESH;  Surgeon: Ralene Ok, MD;  Location: [place];  Service: General;  Laterality: Right;   kidney stone removal     PROSTATECTOMY     TEE WITHOUT CARDIOVERSION N/A 09/28/2014   Procedure: TRANSESOPHAGEAL ECHOCARDIOGRAM (  TEE);  Surgeon: [name], MD;  Location: [place];  Service: Open Heart Surgery;  Laterality: N/A;   TONSILLECTOMY     WISDOM TOOTH EXTRACTION      Prior to Admission medications   Medication Sig Start Date End Date Taking? Authorizing Provider  acetaminophen (TYLENOL) 500 MG tablet Take 1,000 mg by mouth every 6 (six) hours as needed for headache (pain).    Yes [provider]   acyclovir (ZOVIRAX) 800 MG tablet Take 400 mg by mouth in the morning and at bedtime. 01/26/19  Yes [provider]  aspirin EC 81 MG tablet Take 81 mg by mouth at bedtime. Swallow whole.   Yes [provider]  atorvastatin (LIPITOR) 40 MG tablet TAKE 1 TABLET AT BEDTIME 09/27/20  Yes [name], MD  carvedilol (COREG) 6.25 MG tablet TAKE 1 TABLET TWICE DAILY 09/27/20  Yes [name], MD  esomeprazole (NEXIUM) 20 MG capsule Take 20 mg by mouth every other day. Reported on 03/01/2015   Yes [provider]  Lidocaine 4 % PTCH Apply 1 patch topically daily as needed (pain).   Yes [provider]  lisinopril (ZESTRIL) 20 MG tablet TAKE 1 TABLET TWICE DAILY 09/27/20  Yes Cleaver, Jossie Ng, NP  meloxicam (MOBIC) 15 MG tablet Take 15 mg by mouth at bedtime.    Yes [provider]  methimazole (TAPAZOLE) 5 MG tablet Take 0.5 tablets (2.5 mg total) by mouth daily. 10/23/20  Yes Shamleffer, Melanie Crazier, MD  Multiple Vitamins-Minerals (MULTIVITAMIN WITH MINERALS) tablet Take 1 tablet by mouth daily.   Yes [provider]  oxybutynin (DITROPAN) 5 MG tablet Take 5 mg by mouth 2 (two) times daily.  10/22/16  Yes [provider]  prednisoLONE acetate (PRED FORTE) 1 % ophthalmic suspension Place 1 drop into the left eye in the morning and at bedtime.   Yes [provider]  spironolactone (ALDACTONE) 25 MG tablet Take 12.5 mg by mouth daily. Take 1/2 tablet daily   Yes [provider]  timolol (TIMOPTIC) 0.5 % ophthalmic solution Place 1 drop into the left eye 2 (two) times daily. 09/30/16  Yes [provider]    Allergies  Allergen Reactions   Hydrochlorothiazide Swelling    Social History   Socioeconomic History   Marital status: Married    Spouse name: Not on file   Number of children: Not on file   Years of education: Not on file   Highest education level: Not on file  Occupational History   Not on file   Tobacco Use   Smoking status: Former    Types: Cigarettes    Quit date: 05/22/1980    Years since quitting: 40.8   Smokeless tobacco: Never   Tobacco comments:    quit smoking in 1982  Vaping Use   Vaping Use: Never used  Substance and Sexual Activity   Alcohol use: Yes    Alcohol/week: 0.0 standard drinks    Comment: occasional liquor/ Beer   Drug use: No   Sexual activity: Yes  Other Topics Concern   Not on file  Social History Narrative   Pt leaving with wife.   Social Determinants of Health   Financial Resource Strain: Not on file  Food Insecurity: Not on file  Transportation Needs: Not on file  Physical Activity: Not on file  Stress: Not on file  Social Connections: Not on file  Intimate Partner Violence: Not on file    Tobacco Use: Medium Risk  Smoking Tobacco Use: Former   Smokeless Tobacco Use: Never   Passive Exposure: Not on file   Social History   Substance and Sexual Activity  Alcohol Use Yes   Alcohol/week: 0.0 standard drinks   Comment: occasional liquor/ Beer    Family History  Problem Relation Age of Onset   Cancer Mother    Ovarian cancer Mother     ROS: Constitutional: no fever, no chills, no night sweats, no significant weight loss Cardiovascular: no chest pain, no palpitations Respiratory: no cough, no shortness of breath, No COPD Gastrointestinal: no vomiting, no nausea Musculoskeletal: no swelling in Joints, Joint Pain Neurologic: no numbness, no tingling, no difficulty with balance   Objective:  Physical Exam: Well nourished and well developed.  General: Alert and oriented x3, cooperative and pleasant, no acute distress.  Head: normocephalic, atraumatic, neck supple.  Eyes: EOMI.  Respiratory: breath sounds clear in all fields, no wheezing, rales, or rhonchi. Cardiovascular: Regular rate and rhythm, no murmurs, gallops or rubs.  Abdomen: non-tender to palpation and soft, normoactive bowel sounds. Musculoskeletal:  Right  Knee Exam:   No effusion present. No swelling present.   The range of motion is: 0 to 125 degrees.   Positive crepitus on range of motion of the knee.   No medial joint line tenderness.   No lateral joint line tenderness.   The knee is stable.     Left Knee Exam:   Varus deformity.   No effusion present. No swelling present.   The Range of motion is: 5 to 125 degrees.   Moderate crepitus on range of motion of the knee.   Positive medial joint line tenderness.   No lateral joint line tenderness.   Stable knee.   Calves soft and nontender. Motor function intact in LE. Strength 5/5 LE bilaterally. Neuro: Distal pulses 2+. Sensation to light touch intact in LE.   Vital signs in last 24 hours:    Imaging Review Radiographs- AP of bilateral knees and lateral of the left knee from earlier this year demonstrate bone-on-bone arthritis in the medial and patellofemoral compartments with varus deformity and tibial subluxation.  Assessment/Plan:  End stage arthritis, left knee   The patient history, physical examination, clinical judgment of the provider and imaging studies are consistent with end stage degenerative joint disease of the left knee and total knee arthroplasty is deemed medically necessary. The treatment options including medical management, injection therapy arthroscopy and arthroplasty were discussed at length. The risks and benefits of total knee arthroplasty were presented and reviewed. The risks due to aseptic loosening, infection, stiffness, patella tracking problems, thromboembolic complications and other imponderables were discussed. The patient acknowledged the explanation, agreed to proceed with the plan and consent was signed. Patient is being admitted for inpatient treatment for surgery, pain control, PT, OT, prophylactic antibiotics, VTE prophylaxis, progressive ambulation and ADLs and discharge planning. The patient is planning to be discharged  home .   Patient's  anticipated LOS is less than 2 midnights, meeting these requirements: - Younger than 67 - Lives within 1 hour of care - Has a competent adult at home to recover with post-op recover - NO history of  - Chronic pain requiring opiods  - Diabetes  - Coronary Artery Disease  - Heart failure  - Heart attack  - Stroke  - DVT/VTE  - Cardiac arrhythmia  - Respiratory Failure/COPD  - Renal failure  - Anemia  - Advanced Liver disease    Therapy Plans: [facility] Disposition:  Home with Wife Planned DVT Prophylaxis: Xarelto 10mg  (Hx of Prostate Cancer) DME Needed: RW Cardiologist: Dr. Oval Linsey (clearance received - hx of open heart surgery in 2017) TXA: IV Allergies: NKDA Anesthesia Concerns: None BMI: 22.2 Last HgbA1c: n/a  Pharmacy: [facility]  - Patient was instructed on what medications to stop prior to surgery. - Follow-up visit in 2 weeks with Dr. Wynelle Link - Begin physical therapy following surgery - Pre-operative lab work as pre-surgical testing - Prescriptions will be provided in hospital at time of discharge  [place] Foy, [facility], PA-C Orthopedic Surgery [facility] [place]

## 2021-03-07 DIAGNOSIS — Z01812 Encounter for preprocedural laboratory examination: Secondary | ICD-10-CM | POA: Diagnosis not present

## 2021-03-07 DIAGNOSIS — Z20822 Contact with and (suspected) exposure to covid-19: Secondary | ICD-10-CM | POA: Diagnosis not present

## 2021-03-08 ENCOUNTER — Other Ambulatory Visit: Payer: Self-pay

## 2021-03-08 ENCOUNTER — Encounter (HOSPITAL_COMMUNITY)
Admission: RE | Admit: 2021-03-08 | Discharge: 2021-03-08 | Disposition: A | Discharge status: Home / Self Care | Payer: Medicare HMO | Source: Ambulatory Visit | Attending: Orthopedic Surgery | Admitting: Orthopedic Surgery

## 2021-03-08 DIAGNOSIS — Z01812 Encounter for preprocedural laboratory examination: Secondary | ICD-10-CM | POA: Insufficient documentation

## 2021-03-08 DIAGNOSIS — Z20822 Contact with and (suspected) exposure to covid-19: Secondary | ICD-10-CM | POA: Insufficient documentation

## 2021-03-08 LAB — SARS CORONAVIRUS 2 (TAT 6-24 HRS): SARS Coronavirus 2: NEGATIVE

## 2021-03-12 ENCOUNTER — Encounter (HOSPITAL_COMMUNITY): Payer: Self-pay | Admitting: Orthopedic Surgery

## 2021-03-12 ENCOUNTER — Other Ambulatory Visit: Payer: Self-pay

## 2021-03-12 ENCOUNTER — Ambulatory Visit (HOSPITAL_BASED_OUTPATIENT_CLINIC_OR_DEPARTMENT_OTHER): Payer: Medicare HMO | Admitting: Registered Nurse

## 2021-03-12 ENCOUNTER — Ambulatory Visit (HOSPITAL_COMMUNITY): Payer: Medicare HMO | Admitting: Physician Assistant

## 2021-03-12 ENCOUNTER — Encounter (HOSPITAL_COMMUNITY)
Admission: RE | Disposition: A | Discharge status: Home / Self Care | Payer: Self-pay | Source: Ambulatory Visit | Attending: Orthopedic Surgery

## 2021-03-12 ENCOUNTER — Observation Stay (HOSPITAL_COMMUNITY)
Admission: RE | Admit: 2021-03-12 | Discharge: 2021-03-13 | Disposition: A | Discharge status: Home / Self Care | Payer: Medicare HMO | Source: Ambulatory Visit | Attending: Orthopedic Surgery | Admitting: Orthopedic Surgery

## 2021-03-12 DIAGNOSIS — I25110 Atherosclerotic heart disease of native coronary artery with unstable angina pectoris: Secondary | ICD-10-CM | POA: Insufficient documentation

## 2021-03-12 DIAGNOSIS — I25119 Atherosclerotic heart disease of native coronary artery with unspecified angina pectoris: Secondary | ICD-10-CM

## 2021-03-12 DIAGNOSIS — I10 Essential (primary) hypertension: Secondary | ICD-10-CM

## 2021-03-12 DIAGNOSIS — Z7901 Long term (current) use of anticoagulants: Secondary | ICD-10-CM | POA: Insufficient documentation

## 2021-03-12 DIAGNOSIS — Z79899 Other long term (current) drug therapy: Secondary | ICD-10-CM | POA: Insufficient documentation

## 2021-03-12 DIAGNOSIS — N182 Chronic kidney disease, stage 2 (mild): Secondary | ICD-10-CM | POA: Diagnosis not present

## 2021-03-12 DIAGNOSIS — Z8546 Personal history of malignant neoplasm of prostate: Secondary | ICD-10-CM | POA: Insufficient documentation

## 2021-03-12 DIAGNOSIS — M1712 Unilateral primary osteoarthritis, left knee: Principal | ICD-10-CM | POA: Diagnosis present

## 2021-03-12 DIAGNOSIS — Z951 Presence of aortocoronary bypass graft: Secondary | ICD-10-CM | POA: Insufficient documentation

## 2021-03-12 DIAGNOSIS — I1310 Hypertensive heart and chronic kidney disease without heart failure, with stage 1 through stage 4 chronic kidney disease, or unspecified chronic kidney disease: Secondary | ICD-10-CM | POA: Diagnosis not present

## 2021-03-12 DIAGNOSIS — Z7982 Long term (current) use of aspirin: Secondary | ICD-10-CM | POA: Diagnosis not present

## 2021-03-12 DIAGNOSIS — N289 Disorder of kidney and ureter, unspecified: Secondary | ICD-10-CM

## 2021-03-12 DIAGNOSIS — I4891 Unspecified atrial fibrillation: Secondary | ICD-10-CM | POA: Diagnosis not present

## 2021-03-12 DIAGNOSIS — Z87891 Personal history of nicotine dependence: Secondary | ICD-10-CM | POA: Insufficient documentation

## 2021-03-12 DIAGNOSIS — M179 Osteoarthritis of knee, unspecified: Secondary | ICD-10-CM | POA: Diagnosis present

## 2021-03-12 DIAGNOSIS — I340 Nonrheumatic mitral (valve) insufficiency: Secondary | ICD-10-CM | POA: Diagnosis not present

## 2021-03-12 DIAGNOSIS — G8918 Other acute postprocedural pain: Secondary | ICD-10-CM | POA: Diagnosis not present

## 2021-03-12 DIAGNOSIS — Z01818 Encounter for other preprocedural examination: Secondary | ICD-10-CM

## 2021-03-12 HISTORY — PX: TOTAL KNEE ARTHROPLASTY: SHX125

## 2021-03-12 SURGERY — ARTHROPLASTY, KNEE, TOTAL
Anesthesia: Spinal | Site: Knee | Laterality: Left

## 2021-03-12 MED ORDER — BUPIVACAINE LIPOSOME 1.3 % IJ SUSP
INTRAMUSCULAR | Status: DC | PRN
  Administered 2021-03-12: 20 mL

## 2021-03-12 MED ORDER — PANTOPRAZOLE SODIUM 40 MG PO TBEC
40.0000 mg | DELAYED_RELEASE_TABLET | Freq: Every day | ORAL | Status: DC
  Administered 2021-03-13: 40 mg via ORAL
  Filled 2021-03-12: qty 1

## 2021-03-12 MED ORDER — DOCUSATE SODIUM 100 MG PO CAPS
100.0000 mg | ORAL_CAPSULE | Freq: Two times a day (BID) | ORAL | Status: DC
  Administered 2021-03-12 – 2021-03-13 (×2): 100 mg via ORAL
  Filled 2021-03-12 (×2): qty 1

## 2021-03-12 MED ORDER — METOCLOPRAMIDE HCL 5 MG PO TABS: 5.0000 mg | ORAL_TABLET | Freq: Three times a day (TID) | ORAL | Status: DC | PRN

## 2021-03-12 MED ORDER — CLONIDINE HCL (ANALGESIA) 100 MCG/ML EP SOLN
EPIDURAL | Status: DC | PRN
  Administered 2021-03-12: 80 ug

## 2021-03-12 MED ORDER — TRAMADOL HCL 50 MG PO TABS
50.0000 mg | ORAL_TABLET | Freq: Four times a day (QID) | ORAL | Status: DC | PRN
  Administered 2021-03-12: 100 mg via ORAL
  Filled 2021-03-12: qty 2

## 2021-03-12 MED ORDER — DEXAMETHASONE SODIUM PHOSPHATE 10 MG/ML IJ SOLN
INTRAMUSCULAR | Status: AC
  Filled 2021-03-12: qty 1

## 2021-03-12 MED ORDER — BUPIVACAINE LIPOSOME 1.3 % IJ SUSP: 20.0000 mL | Freq: Once | INTRAMUSCULAR | Status: AC

## 2021-03-12 MED ORDER — LACTATED RINGERS IV SOLN: INTRAVENOUS | Status: DC

## 2021-03-12 MED ORDER — CARVEDILOL 6.25 MG PO TABS
6.2500 mg | ORAL_TABLET | Freq: Two times a day (BID) | ORAL | Status: DC
  Administered 2021-03-12 – 2021-03-13 (×2): 6.25 mg via ORAL
  Filled 2021-03-12 (×2): qty 1

## 2021-03-12 MED ORDER — SODIUM CHLORIDE (PF) 0.9 % IJ SOLN
INTRAMUSCULAR | Status: AC
  Filled 2021-03-12: qty 10

## 2021-03-12 MED ORDER — CEFAZOLIN SODIUM-DEXTROSE 2-4 GM/100ML-% IV SOLN
2.0000 g | INTRAVENOUS | Status: AC
  Administered 2021-03-12: 2 g via INTRAVENOUS
  Filled 2021-03-12: qty 100

## 2021-03-12 MED ORDER — ACETAMINOPHEN 10 MG/ML IV SOLN
1000.0000 mg | Freq: Once | INTRAVENOUS | Status: AC
  Administered 2021-03-12: 1000 mg via INTRAVENOUS
  Filled 2021-03-12: qty 100

## 2021-03-12 MED ORDER — PROPOFOL 500 MG/50ML IV EMUL
INTRAVENOUS | Status: DC | PRN
  Administered 2021-03-12: 40 ug/kg/min via INTRAVENOUS

## 2021-03-12 MED ORDER — METHOCARBAMOL 500 MG IVPB - SIMPLE MED
500.0000 mg | Freq: Four times a day (QID) | INTRAVENOUS | Status: DC | PRN
  Filled 2021-03-12: qty 50

## 2021-03-12 MED ORDER — BUPIVACAINE LIPOSOME 1.3 % IJ SUSP
INTRAMUSCULAR | Status: AC
  Filled 2021-03-12: qty 20

## 2021-03-12 MED ORDER — BUPIVACAINE IN DEXTROSE 0.75-8.25 % IT SOLN
INTRATHECAL | Status: DC | PRN
  Administered 2021-03-12: 1.6 mL via INTRATHECAL

## 2021-03-12 MED ORDER — RIVAROXABAN 10 MG PO TABS
10.0000 mg | ORAL_TABLET | Freq: Every day | ORAL | Status: DC
  Administered 2021-03-13: 10 mg via ORAL
  Filled 2021-03-12: qty 1

## 2021-03-12 MED ORDER — METHOCARBAMOL 500 MG PO TABS
500.0000 mg | ORAL_TABLET | Freq: Four times a day (QID) | ORAL | Status: DC | PRN
  Administered 2021-03-12: 500 mg via ORAL
  Filled 2021-03-12: qty 1

## 2021-03-12 MED ORDER — ACETAMINOPHEN 500 MG PO TABS
1000.0000 mg | ORAL_TABLET | Freq: Four times a day (QID) | ORAL | Status: DC
  Administered 2021-03-12 – 2021-03-13 (×3): 1000 mg via ORAL
  Filled 2021-03-12 (×3): qty 2

## 2021-03-12 MED ORDER — OXYBUTYNIN CHLORIDE 5 MG PO TABS
5.0000 mg | ORAL_TABLET | Freq: Two times a day (BID) | ORAL | Status: DC
  Administered 2021-03-12 – 2021-03-13 (×2): 5 mg via ORAL
  Filled 2021-03-12 (×2): qty 1

## 2021-03-12 MED ORDER — FENTANYL CITRATE (PF) 100 MCG/2ML IJ SOLN
INTRAMUSCULAR | Status: AC
  Filled 2021-03-12: qty 2

## 2021-03-12 MED ORDER — PHENYLEPHRINE HCL-NACL 20-0.9 MG/250ML-% IV SOLN
INTRAVENOUS | Status: DC | PRN
  Administered 2021-03-12: 40 ug/min via INTRAVENOUS

## 2021-03-12 MED ORDER — ORAL CARE MOUTH RINSE: 15.0000 mL | Freq: Once | OROMUCOSAL | Status: AC

## 2021-03-12 MED ORDER — PREDNISOLONE ACETATE 1 % OP SUSP
1.0000 [drp] | Freq: Two times a day (BID) | OPHTHALMIC | Status: DC
  Administered 2021-03-12 – 2021-03-13 (×2): 1 [drp] via OPHTHALMIC
  Filled 2021-03-12: qty 5

## 2021-03-12 MED ORDER — FENTANYL CITRATE PF 50 MCG/ML IJ SOSY
50.0000 ug | PREFILLED_SYRINGE | INTRAMUSCULAR | Status: DC
  Administered 2021-03-12: 50 ug via INTRAVENOUS
  Filled 2021-03-12: qty 2

## 2021-03-12 MED ORDER — METOCLOPRAMIDE HCL 5 MG/ML IJ SOLN: 5.0000 mg | Freq: Three times a day (TID) | INTRAMUSCULAR | Status: DC | PRN

## 2021-03-12 MED ORDER — DEXAMETHASONE SODIUM PHOSPHATE 4 MG/ML IJ SOLN
INTRAMUSCULAR | Status: DC | PRN
  Administered 2021-03-12: 3 mg via PERINEURAL

## 2021-03-12 MED ORDER — SODIUM CHLORIDE 0.9 % IV SOLN: INTRAVENOUS | Status: DC

## 2021-03-12 MED ORDER — HYDROMORPHONE HCL 1 MG/ML IJ SOLN
INTRAMUSCULAR | Status: AC
  Administered 2021-03-12: 0.5 mg via INTRAVENOUS
  Filled 2021-03-12: qty 1

## 2021-03-12 MED ORDER — HYDROMORPHONE HCL 1 MG/ML IJ SOLN
0.2500 mg | INTRAMUSCULAR | Status: DC | PRN
  Administered 2021-03-12: 0.5 mg via INTRAVENOUS

## 2021-03-12 MED ORDER — POLYETHYLENE GLYCOL 3350 17 G PO PACK: 17.0000 g | PACK | Freq: Every day | ORAL | Status: DC | PRN

## 2021-03-12 MED ORDER — ONDANSETRON HCL 4 MG/2ML IJ SOLN: 4.0000 mg | Freq: Four times a day (QID) | INTRAMUSCULAR | Status: DC | PRN

## 2021-03-12 MED ORDER — SPIRONOLACTONE 12.5 MG HALF TABLET
12.5000 mg | ORAL_TABLET | Freq: Every day | ORAL | Status: DC
  Administered 2021-03-13: 12.5 mg via ORAL
  Filled 2021-03-12: qty 1

## 2021-03-12 MED ORDER — PROMETHAZINE HCL 25 MG/ML IJ SOLN: 6.2500 mg | INTRAMUSCULAR | Status: DC | PRN

## 2021-03-12 MED ORDER — MORPHINE SULFATE (PF) 2 MG/ML IV SOLN: 0.5000 mg | INTRAVENOUS | Status: DC | PRN

## 2021-03-12 MED ORDER — MENTHOL 3 MG MT LOZG: 1.0000 | LOZENGE | OROMUCOSAL | Status: DC | PRN

## 2021-03-12 MED ORDER — CEFAZOLIN SODIUM-DEXTROSE 2-4 GM/100ML-% IV SOLN
2.0000 g | Freq: Four times a day (QID) | INTRAVENOUS | Status: AC
  Administered 2021-03-12 (×2): 2 g via INTRAVENOUS
  Filled 2021-03-12 (×2): qty 100

## 2021-03-12 MED ORDER — DEXAMETHASONE SODIUM PHOSPHATE 10 MG/ML IJ SOLN: 10.0000 mg | Freq: Once | INTRAMUSCULAR | Status: DC

## 2021-03-12 MED ORDER — DEXAMETHASONE SODIUM PHOSPHATE 10 MG/ML IJ SOLN
8.0000 mg | Freq: Once | INTRAMUSCULAR | Status: AC
  Administered 2021-03-12: 8 mg via INTRAVENOUS

## 2021-03-12 MED ORDER — OXYCODONE HCL 5 MG PO TABS
5.0000 mg | ORAL_TABLET | ORAL | Status: DC | PRN
  Administered 2021-03-12 – 2021-03-13 (×5): 10 mg via ORAL
  Filled 2021-03-12 (×5): qty 2

## 2021-03-12 MED ORDER — FLEET ENEMA 7-19 GM/118ML RE ENEM: 1.0000 | ENEMA | Freq: Once | RECTAL | Status: DC | PRN

## 2021-03-12 MED ORDER — TRANEXAMIC ACID-NACL 1000-0.7 MG/100ML-% IV SOLN
1000.0000 mg | INTRAVENOUS | Status: AC
  Administered 2021-03-12: 1000 mg via INTRAVENOUS
  Filled 2021-03-12: qty 100

## 2021-03-12 MED ORDER — BISACODYL 10 MG RE SUPP: 10.0000 mg | Freq: Every day | RECTAL | Status: DC | PRN

## 2021-03-12 MED ORDER — PROPOFOL 1000 MG/100ML IV EMUL
INTRAVENOUS | Status: AC
  Filled 2021-03-12: qty 100

## 2021-03-12 MED ORDER — METHIMAZOLE 5 MG PO TABS
2.5000 mg | ORAL_TABLET | Freq: Every day | ORAL | Status: DC
  Administered 2021-03-13: 2.5 mg via ORAL
  Filled 2021-03-12: qty 1

## 2021-03-12 MED ORDER — ROPIVACAINE HCL 7.5 MG/ML IJ SOLN
INTRAMUSCULAR | Status: DC | PRN
  Administered 2021-03-12: 20 mL via PERINEURAL

## 2021-03-12 MED ORDER — SODIUM CHLORIDE 0.9 % IR SOLN
Status: DC | PRN
  Administered 2021-03-12 (×2): 1000 mL

## 2021-03-12 MED ORDER — SODIUM CHLORIDE (PF) 0.9 % IJ SOLN
INTRAMUSCULAR | Status: DC | PRN
  Administered 2021-03-12: 60 mL

## 2021-03-12 MED ORDER — CHLORHEXIDINE GLUCONATE CLOTH 2 % EX PADS: 6.0000 | MEDICATED_PAD | Freq: Every day | CUTANEOUS | Status: DC

## 2021-03-12 MED ORDER — PHENOL 1.4 % MT LIQD: 1.0000 | OROMUCOSAL | Status: DC | PRN

## 2021-03-12 MED ORDER — CHLORHEXIDINE GLUCONATE 0.12 % MT SOLN: 15.0000 mL | Freq: Once | OROMUCOSAL | Status: AC

## 2021-03-12 MED ORDER — TIMOLOL MALEATE 0.5 % OP SOLN
1.0000 [drp] | Freq: Two times a day (BID) | OPHTHALMIC | Status: DC
  Administered 2021-03-12: 1 [drp] via OPHTHALMIC
  Filled 2021-03-12: qty 5

## 2021-03-12 MED ORDER — ATORVASTATIN CALCIUM 40 MG PO TABS: 40.0000 mg | ORAL_TABLET | Freq: Every day | ORAL | Status: DC

## 2021-03-12 MED ORDER — DIPHENHYDRAMINE HCL 12.5 MG/5ML PO ELIX
12.5000 mg | ORAL_SOLUTION | ORAL | Status: DC | PRN
  Administered 2021-03-12 – 2021-03-13 (×2): 25 mg via ORAL
  Filled 2021-03-12 (×2): qty 10

## 2021-03-12 MED ORDER — ONDANSETRON HCL 4 MG PO TABS: 4.0000 mg | ORAL_TABLET | Freq: Four times a day (QID) | ORAL | Status: DC | PRN

## 2021-03-12 MED ORDER — ONDANSETRON HCL 4 MG/2ML IJ SOLN
INTRAMUSCULAR | Status: DC | PRN
Start: 2021-03-12 — End: 2021-03-12
  Administered 2021-03-12: 4 mg via INTRAVENOUS

## 2021-03-12 MED ORDER — ONDANSETRON HCL 4 MG/2ML IJ SOLN
INTRAMUSCULAR | Status: AC
  Filled 2021-03-12: qty 2

## 2021-03-12 MED ORDER — SODIUM CHLORIDE 0.9 % IV BOLUS
250.0000 mL | Freq: Once | INTRAVENOUS | Status: AC
  Administered 2021-03-12: 250 mL via INTRAVENOUS

## 2021-03-12 MED ORDER — METHOCARBAMOL 500 MG IVPB - SIMPLE MED
INTRAVENOUS | Status: AC
  Administered 2021-03-12: 500 mg
  Filled 2021-03-12: qty 50

## 2021-03-12 MED ORDER — POVIDONE-IODINE 10 % EX SWAB
2.0000 "application " | Freq: Once | CUTANEOUS | Status: AC
  Administered 2021-03-12: 2 via TOPICAL

## 2021-03-12 SURGICAL SUPPLY — 52 items
BAG COUNTER SPONGE SURGICOUNT (BAG) ×1 IMPLANT
BAG DECANTER FOR FLEXI CONT (MISCELLANEOUS) ×1 IMPLANT
BAG ZIPLOCK 12X15 (MISCELLANEOUS) ×2 IMPLANT
BLADE SAG 18X100X1.27 (BLADE) ×2 IMPLANT
BLADE SAW SGTL 11.0X1.19X90.0M (BLADE) ×2 IMPLANT
BNDG ELASTIC 6X5.8 VLCR STR LF (GAUZE/BANDAGES/DRESSINGS) ×2 IMPLANT
BOWL SMART MIX CTS (DISPOSABLE) ×2 IMPLANT
CEMENT HV SMART SET (Cement) ×4 IMPLANT
CEMENT TIBIA MBT SIZE 4 (Knees) IMPLANT
COVER SURGICAL LIGHT HANDLE (MISCELLANEOUS) ×2 IMPLANT
CUFF TOURN SGL QUICK 34 (TOURNIQUET CUFF) ×2
CUFF TRNQT CYL 34X4.125X (TOURNIQUET CUFF) ×1 IMPLANT
DRAPE INCISE IOBAN 66X45 STRL (DRAPES) ×2 IMPLANT
DRAPE U-SHAPE 47X51 STRL (DRAPES) ×2 IMPLANT
DRSG AQUACEL AG ADV 3.5X10 (GAUZE/BANDAGES/DRESSINGS) ×2 IMPLANT
DURAPREP 26ML APPLICATOR (WOUND CARE) ×2 IMPLANT
ELECT REM PT RETURN 15FT ADLT (MISCELLANEOUS) ×2 IMPLANT
FEMUR SIGMA PS SZ 5.0 L (Femur) ×1 IMPLANT
GLOVE SRG 8 PF TXTR STRL LF DI (GLOVE) ×1 IMPLANT
GLOVE SURG ENC MOIS LTX SZ6.5 (GLOVE) ×2 IMPLANT
GLOVE SURG ENC MOIS LTX SZ8 (GLOVE) ×4 IMPLANT
GLOVE SURG UNDER POLY LF SZ7 (GLOVE) ×2 IMPLANT
GLOVE SURG UNDER POLY LF SZ8 (GLOVE) ×2
GLOVE SURG UNDER POLY LF SZ8.5 (GLOVE) ×2 IMPLANT
GOWN STRL REUS W/TWL LRG LVL3 (GOWN DISPOSABLE) ×2 IMPLANT
GOWN STRL REUS W/TWL XL LVL3 (GOWN DISPOSABLE) IMPLANT
HANDPIECE INTERPULSE COAX TIP (DISPOSABLE) ×2
HOLDER FOLEY CATH W/STRAP (MISCELLANEOUS) ×1 IMPLANT
IMMOBILIZER KNEE 20 (SOFTGOODS) ×2
IMMOBILIZER KNEE 20 THIGH 36 (SOFTGOODS) ×1 IMPLANT
KIT TURNOVER KIT A (KITS) IMPLANT
MANIFOLD NEPTUNE II (INSTRUMENTS) ×2 IMPLANT
NS IRRIG 1000ML POUR BTL (IV SOLUTION) ×2 IMPLANT
PACK TOTAL KNEE CUSTOM (KITS) ×2 IMPLANT
PADDING CAST COTTON 6X4 STRL (CAST SUPPLIES) ×3 IMPLANT
PATELLA DOME PFC 41MM (Knees) ×1 IMPLANT
PLATE ROT INSERT 12.5MM (Plate) ×1 IMPLANT
PROTECTOR NERVE ULNAR (MISCELLANEOUS) ×2 IMPLANT
SET HNDPC FAN SPRY TIP SCT (DISPOSABLE) ×1 IMPLANT
SPIKE FLUID TRANSFER (MISCELLANEOUS) ×1 IMPLANT
SPONGE T-LAP 18X18 ~~LOC~~+RFID (SPONGE) ×4 IMPLANT
STRIP CLOSURE SKIN 1/2X4 (GAUZE/BANDAGES/DRESSINGS) ×4 IMPLANT
SUT MNCRL AB 4-0 PS2 18 (SUTURE) ×2 IMPLANT
SUT STRATAFIX 0 PDS 27 VIOLET (SUTURE) ×2
SUT VIC AB 2-0 CT1 27 (SUTURE) ×6
SUT VIC AB 2-0 CT1 TAPERPNT 27 (SUTURE) ×3 IMPLANT
SUTURE STRATFX 0 PDS 27 VIOLET (SUTURE) ×1 IMPLANT
TIBIA MBT CEMENT SIZE 4 (Knees) ×2 IMPLANT
TRAY FOLEY MTR SLVR 16FR STAT (SET/KITS/TRAYS/PACK) ×2 IMPLANT
TUBE SUCTION HIGH CAP CLEAR NV (SUCTIONS) ×2 IMPLANT
WATER STERILE IRR 1000ML POUR (IV SOLUTION) ×4 IMPLANT
WRAP KNEE MAXI GEL POST OP (GAUZE/BANDAGES/DRESSINGS) ×2 IMPLANT

## 2021-03-12 NOTE — Interval H&P Note (Signed)
History and Physical Interval Note:  03/12/2021 9:17 AM  [name] [name]  has presented today for surgery, with the diagnosis of left knee osteoarthritis.  The various methods of treatment have been discussed with the patient and family. After consideration of risks, benefits and other options for treatment, the patient has consented to  Procedure(s): TOTAL KNEE ARTHROPLASTY (Left) as a surgical intervention.  The patient's history has been reviewed, patient examined, no change in status, stable for surgery.  I have reviewed the patient's chart and labs.  Questions were answered to the patient's satisfaction.     Pilar Plate Darnell Jeschke

## 2021-03-12 NOTE — Anesthesia Postprocedure Evaluation (Signed)
Anesthesia Post Note  Patient: [name] [name]  Procedure(s) Performed: TOTAL KNEE ARTHROPLASTY (Left: Knee)     Patient location during evaluation: PACU Anesthesia Type: Spinal Level of consciousness: awake and alert Pain management: pain level controlled Vital Signs Assessment: post-procedure vital signs reviewed and stable Respiratory status: spontaneous breathing Cardiovascular status: stable Anesthetic complications: no   No notable events documented.  Last Vitals:  Vitals:   03/12/21 1717 03/12/21 2008  BP: 90/64 127/83  Pulse: (!) 51 65  Resp: 15 16  Temp:  36.8 C  SpO2: 97% 100%    Last Pain:  Vitals:   03/12/21 2046  TempSrc:   PainSc: [place]

## 2021-03-12 NOTE — Anesthesia Procedure Notes (Signed)
Spinal  Start time: 03/12/2021 11:11 AM End time: 03/12/2021 11:16 AM Reason for block: surgical anesthesia Staffing Resident/CRNA: Victoriano Lain, CRNA Preanesthetic Checklist Completed: patient identified, IV checked, site marked, risks and benefits discussed, surgical consent, monitors and equipment checked, pre-op evaluation and timeout performed Spinal Block Patient position: sitting Prep: ChloraPrep and site prepped and draped Patient monitoring: heart rate, continuous pulse ox and blood pressure Approach: midline Location: L3-4 Injection technique: single-shot Needle Needle type: Pencan  Needle gauge: 24 G Needle length: 10 cm Assessment Sensory level: T4 Events: CSF return Additional Notes Spinal kit expiration date checked and verified. Pt placed in sitting position. Sterile prep and drape of back. One attempt by CRNA. + clear CSF, - heme. Pt tolerated well.

## 2021-03-12 NOTE — Transfer of Care (Signed)
Immediate Anesthesia Transfer of Care Note  Patient: [name] [name]  Procedure(s) Performed: TOTAL KNEE ARTHROPLASTY (Left: Knee)  Patient Location: PACU  Anesthesia Type:MAC and Spinal  Level of Consciousness: awake, alert , oriented and patient cooperative  Airway & Oxygen Therapy: Patient Spontanous Breathing and Patient connected to face mask oxygen  Post-op Assessment: Report given to RN and Post -op Vital signs reviewed and stable  Post vital signs: Reviewed and stable  Last Vitals:  Vitals Value Taken Time  BP 115/82 03/12/21 1246  Temp    Pulse 54 03/12/21 1248  Resp 16 03/12/21 1248  SpO2 100 % 03/12/21 1248  Vitals shown include unvalidated device data.  Last Pain:  Vitals:   03/12/21 0859  TempSrc:   PainSc: 6       Patients Stated Pain Goal: 3 (63/78/58 8502)  Complications: No notable events documented.

## 2021-03-12 NOTE — Progress Notes (Signed)
Orthopedic Tech Progress Note Patient Details:  [name] [name] 10-15-1940 [id number]      Post Interventions Patient Tolerated: Well Instructions Provided: Care of device, Adjustment of device  [name] 03/12/2021, 12:46 PM

## 2021-03-12 NOTE — Care Plan (Signed)
Ortho Bundle Case Management Note  Patient Details  Name: [name] [name] MRN: [id number] Date of Birth: 01-03-1941  L TKA on 03-12-21 DCP:  Home with wife DME:  No needs, has a RW PT:  EO on 03-16-21.                   DME Arranged:  N/A DME Agency:  NA  HH Arranged:  NA HH Agency:  NA  Additional Comments: Please contact me with any questions of if this plan should need to change.  Marianne Sofia, RN,CCM [facility]  [phone] 03/12/2021, 2:05 PM

## 2021-03-12 NOTE — Evaluation (Signed)
Physical Therapy Evaluation Patient Details Name: RISHIKESH KHACHATRYAN MRN: [id number] DOB: 05-31-1940 Today's Date: 03/12/2021  History of Present Illness  81 yo s/p L TKA on 02/09/21. PMH: HTN, CABG x4, CKD, aortic valve replacement, inguinal hernia repair  Clinical Impression  Pt is s/p TKA resulting in the deficits listed below (see PT Problem List).  Pt amb ~ 78' with RW and min/guard assist, feeling well.  Pt appeared pale after seated and reclined, exhibiting bradycardia with HR in 30s on continuous monitor,  BP 59/47, RN notified. BP rechecked after 5 minutes-->61/43, RN made aware. Pt left in recliner in fully reclined position with wife present and chair alarm on.  Provided pt with snack and drink.   Pt will benefit from skilled PT to increase their independence and safety with mobility to allow discharge to the venue listed below.         Recommendations for follow up therapy are one component of a multi-disciplinary discharge planning process, led by the attending physician.  Recommendations may be updated based on patient status, additional functional criteria and insurance authorization.  Follow Up Recommendations Follow physician's recommendations for discharge plan and follow up therapies    Assistance Recommended at Discharge Intermittent Supervision/Assistance  Patient can return home with the following  Assistance with cooking/housework;Help with stairs or ramp for entrance;Assist for transportation    Equipment Recommendations None recommended by PT  Recommendations for Other Services       Functional Status Assessment Patient has had a recent decline in their functional status and demonstrates the ability to make significant improvements in function in a reasonable and predictable amount of time.     Precautions / Restrictions Precautions Precautions: Fall;Knee Restrictions Weight Bearing Restrictions: No Other Position/Activity Restrictions: WBAT       Mobility  Bed Mobility Overal bed mobility: Needs Assistance Bed Mobility: Supine to Sit     Supine to sit: Supervision     General bed mobility comments: for safety    Transfers Overall transfer level: Needs assistance Equipment used: Rolling walker (2 wheels) Transfers: Sit to/from Stand Sit to Stand: Min assist           General transfer comment: light assist to rise and transition to RW, cues for hand placement and LLE position    Ambulation/Gait Ambulation/Gait assistance: Min assist, Min guard Gait Distance (Feet): 40 Feet Assistive device: Rolling walker (2 wheels) Gait Pattern/deviations: Step-to pattern, Decreased stance time - left       General Gait Details: cues for sequence, RW position  Stairs            Wheelchair Mobility    Modified Rankin (Stroke Patients Only)       Balance                                             Pertinent Vitals/Pain Pain Assessment Pain Assessment: 0-10 Pain Location: L knee Pain Descriptors / Indicators: Aching, Grimacing, Sore Pain Intervention(s): Repositioned, Premedicated before session, Ice applied, Monitored during session, Limited activity within patient's tolerance    Home Living Family/patient expects to be discharged to:: Private residence Living Arrangements: Spouse/significant other Available Help at Discharge: Family Type of Home: House Home Access: Ramped entrance       Home Layout: One level Home Equipment: Conservation officer, nature (2 wheels)      Prior Function Prior Level of Function :  Independent/Modified Independent                     Hand Dominance        Extremity/Trunk Assessment   Upper Extremity Assessment Upper Extremity Assessment: Overall WFL for tasks assessed    Lower Extremity Assessment Lower Extremity Assessment: LLE deficits/detail LLE Deficits / Details: ankle WFL, knee extension and hip flexion 3/5       Communication    Communication: No difficulties  Cognition Arousal/Alertness: Awake/alert Behavior During Therapy: WFL for tasks assessed/performed Overall Cognitive Status: Within Functional Limits for tasks assessed                                          General Comments      Exercises Total Joint Exercises Ankle Circles/Pumps: AROM, Both, 10 reps Quad Sets: 5 reps, AROM, Both Straight Leg Raises: AROM, Left, 5 reps   Assessment/Plan    PT Assessment Patient needs continued PT services  PT Problem List Decreased strength;Decreased range of motion;Decreased activity tolerance;Decreased balance;Decreased mobility;Pain;Decreased knowledge of use of DME       PT Treatment Interventions DME instruction;Therapeutic exercise;Gait training;Therapeutic activities;Patient/family education;Functional mobility training    PT Goals (Current goals can be found in the Care Plan section)  Acute Rehab PT Goals Patient Stated Goal: home, less knee pain PT Goal Formulation: With patient Time For Goal Achievement: 03/19/21 Potential to Achieve Goals: Good    Frequency 7X/week     Co-evaluation               AM-PAC PT "6 Clicks" Mobility  Outcome Measure Help needed turning from your back to your side while in a flat bed without using bedrails?: A Little Help needed moving from lying on your back to sitting on the side of a flat bed without using bedrails?: A Little Help needed moving to and from a bed to a chair (including a wheelchair)?: A Little Help needed standing up from a chair using your arms (e.g., wheelchair or bedside chair)?: A Little Help needed to walk in hospital room?: A Little Help needed climbing 3-5 steps with a railing? : A Lot 6 Click Score: 17    End of Session Equipment Utilized During Treatment: Gait belt Activity Tolerance: Patient tolerated treatment well;Other (comment) (low BP and decr HR after amb, pt reclined and RN notified) Patient left: in  chair;with call bell/phone within reach;with chair alarm set   PT Visit Diagnosis: Other abnormalities of gait and mobility (R26.89);Difficulty in walking, not elsewhere classified (R26.2)    Time: 4665-9935 PT Time Calculation (min) (ACUTE ONLY): 35 min   Charges:   PT Evaluation $PT Eval Low Complexity: 1 Low PT Treatments $Gait Training: 8-22 mins        Baxter Flattery, PT  Acute Rehab Dept ([place]) [phone] Pager [phone]  03/12/2021   [facility] 03/12/2021, 5:20 PM

## 2021-03-12 NOTE — Discharge Instructions (Addendum)
Gaynelle Arabian, MD Total Joint Specialist [facility] [place] [address], [address] [place], [place] [address] [phone]  TOTAL KNEE REPLACEMENT POSTOPERATIVE DIRECTIONS    Knee Rehabilitation, Guidelines Following Surgery  Results after knee surgery are often greatly improved when you follow the exercise, range of motion and muscle strengthening exercises prescribed by your doctor. Safety measures are also important to protect the knee from further injury. If any of these exercises cause you to have increased pain or swelling in your knee joint, decrease the amount until you are comfortable again and slowly increase them. If you have problems or questions, call your caregiver or physical therapist for advice.   BLOOD CLOT PREVENTION Take a 10 mg Xarelto once a day for three weeks following surgery. Then resume one 81 mg aspirin once a day. You may resume your vitamins/supplements once you have discontinued the Xarelto. Do not take any NSAIDs (Advil, Aleve, Ibuprofen, Meloxicam, etc.) until you have discontinued the Xarelto.    Information on my medicine - XARELTO (Rivaroxaban)  Why was Xarelto prescribed for you? Xarelto was prescribed for you to reduce the risk of blood clots forming after orthopedic surgery. The medical term for these abnormal blood clots is venous thromboembolism (VTE).  What do you need to know about xarelto ? Take your Xarelto ONCE DAILY at the same time every day. You may take it either with or without food.  If you have difficulty swallowing the tablet whole, you may crush it and mix in applesauce just prior to taking your dose.  Take Xarelto exactly as prescribed by your doctor and DO NOT stop taking Xarelto without talking to the doctor who prescribed the medication.  Stopping without other VTE prevention medication to take the place of Xarelto may increase your risk of developing a clot.  After discharge, you should have regular  check-up appointments with your healthcare provider that is prescribing your Xarelto.    What do you do if you miss a dose? If you miss a dose, take it as soon as you remember on the same day then continue your regularly scheduled once daily regimen the next day. Do not take two doses of Xarelto on the same day.   Important Safety Information A possible side effect of Xarelto is bleeding. You should call your healthcare provider right away if you experience any of the following: Bleeding from an injury or your nose that does not stop. Unusual colored urine (red or dark brown) or unusual colored stools (red or black). Unusual bruising for unknown reasons. A serious fall or if you hit your head (even if there is no bleeding).  Some medicines may interact with Xarelto and might increase your risk of bleeding while on Xarelto. To help avoid this, consult your healthcare provider or pharmacist prior to using any new prescription or non-prescription medications, including herbals, vitamins, non-steroidal anti-inflammatory drugs (NSAIDs) and supplements.  This website has more information on Xarelto: [email or web address]-benson.[email or web address]/.    HOME CARE INSTRUCTIONS  Remove items at home which could result in a fall. This includes throw rugs or furniture in walking pathways.  ICE to the affected knee as much as tolerated. Icing helps control swelling. If the swelling is well controlled you will be more comfortable and rehab easier. Continue to use ice on the knee for pain and swelling from surgery. You may notice swelling that will progress down to the foot and ankle. This is normal after surgery. Elevate the leg when you are not up  walking on it.    Continue to use the breathing machine which will help keep your temperature down. It is common for your temperature to cycle up and down following surgery, especially at night when you are not up moving around and exerting yourself. The breathing machine keeps your lungs  expanded and your temperature down. Do not place pillow under the operative knee, focus on keeping the knee straight while resting  DIET You may resume your previous home diet once you are discharged from the hospital.  DRESSING / WOUND CARE / SHOWERING Keep your bulky bandage on for 2 days. On the third post-operative day you may remove the Ace bandage and gauze. There is a waterproof adhesive bandage on your skin which will stay in place until your first follow-up appointment. Once you remove this you will not need to place another bandage You may begin showering 3 days following surgery, but do not submerge the incision under water.  ACTIVITY For the first 5 days, the key is rest and control of pain and swelling Do your home exercises twice a day starting on post-operative day 3. On the days you go to physical therapy, just do the home exercises once that day. You should rest, ice and elevate the leg for 50 minutes out of every hour. Get up and walk/stretch for 10 minutes per hour. After 5 days you can increase your activity slowly as tolerated. Walk with your walker as instructed. Use the walker until you are comfortable transitioning to a cane. Walk with the cane in the opposite hand of the operative leg. You may discontinue the cane once you are comfortable and walking steadily. Avoid periods of inactivity such as sitting longer than an hour when not asleep. This helps prevent blood clots.  You may discontinue the knee immobilizer once you are able to perform a straight leg raise while lying down. You may resume a sexual relationship in one month or when given the OK by your doctor.  You may return to work once you are cleared by your doctor.  Do not drive a car for 6 weeks or until released by your surgeon.  Do not drive while taking narcotics.  TED HOSE STOCKINGS Wear the elastic stockings on both legs for three weeks following surgery during the day. You may remove them at night for  sleeping.  WEIGHT BEARING Weight bearing as tolerated with assist device (walker, cane, etc) as directed, use it as long as suggested by your surgeon or therapist, typically at least 4-6 weeks.  POSTOPERATIVE CONSTIPATION PROTOCOL Constipation - defined medically as fewer than three stools per week and severe constipation as less than one stool per week.  One of the most common issues patients have following surgery is constipation.  Even if you have a regular bowel pattern at home, your normal regimen is likely to be disrupted due to multiple reasons following surgery.  Combination of anesthesia, postoperative narcotics, change in appetite and fluid intake all can affect your bowels.  In order to avoid complications following surgery, here are some recommendations in order to help you during your recovery period.  Colace (docusate) - Pick up an over-the-counter form of Colace or another stool softener and take twice a day as long as you are requiring postoperative pain medications.  Take with a full glass of water daily.  If you experience loose stools or diarrhea, hold the colace until you stool forms back up. If your symptoms do not get better within 1  week or if they get worse, check with your doctor. Dulcolax (bisacodyl) - Pick up over-the-counter and take as directed by the product packaging as needed to assist with the movement of your bowels.  Take with a full glass of water.  Use this product as needed if not relieved by Colace only.  MiraLax (polyethylene glycol) - Pick up over-the-counter to have on hand. MiraLax is a solution that will increase the amount of water in your bowels to assist with bowel movements.  Take as directed and can mix with a glass of water, juice, soda, coffee, or tea. Take if you go more than two days without a movement. Do not use MiraLax more than once per day. Call your doctor if you are still constipated or irregular after using this medication for 7 days in a  row.  If you continue to have problems with postoperative constipation, please contact the office for further assistance and recommendations.  If you experience "the worst abdominal pain ever" or develop nausea or vomiting, please contact the office immediatly for further recommendations for treatment.  ITCHING If you experience itching with your medications, try taking only a single pain pill, or even half a pain pill at a time.  You can also use Benadryl over the counter for itching or also to help with sleep.   MEDICATIONS See your medication summary on the After Visit Summary that the nursing staff will review with you prior to discharge.  You may have some home medications which will be placed on hold until you complete the course of blood thinner medication.  It is important for you to complete the blood thinner medication as prescribed by your surgeon.  Continue your approved medications as instructed at time of discharge.  PRECAUTIONS If you experience chest pain or shortness of breath - call 911 immediately for transfer to the hospital emergency department.  If you develop a fever greater that 101 F, purulent drainage from wound, increased redness or drainage from wound, foul odor from the wound/dressing, or calf pain - CONTACT YOUR SURGEON.                                                   FOLLOW-UP APPOINTMENTS Make sure you keep all of your appointments after your operation with your surgeon and caregivers. You should call the office at the above phone number and make an appointment for approximately two weeks after the date of your surgery or on the date instructed by your surgeon outlined in the "After Visit Summary".  RANGE OF MOTION AND STRENGTHENING EXERCISES  Rehabilitation of the knee is important following a knee injury or an operation. After just a few days of immobilization, the muscles of the thigh which control the knee become weakened and shrink (atrophy). Knee exercises  are designed to build up the tone and strength of the thigh muscles and to improve knee motion. Often times heat used for twenty to thirty minutes before working out will loosen up your tissues and help with improving the range of motion but do not use heat for the first two weeks following surgery. These exercises can be done on a training (exercise) mat, on the floor, on a table or on a bed. Use what ever works the best and is most comfortable for you Knee exercises include:  Leg Lifts -  While your knee is still immobilized in a splint or cast, you can do straight leg raises. Lift the leg to 60 degrees, hold for 3 sec, and slowly lower the leg. Repeat 10-20 times 2-3 times daily. Perform this exercise against resistance later as your knee gets better.  Quad and Hamstring Sets - Tighten up the muscle on the front of the thigh (Quad) and hold for 5-10 sec. Repeat this 10-20 times hourly. Hamstring sets are done by pushing the foot backward against an object and holding for 5-10 sec. Repeat as with quad sets.  Leg Slides: Lying on your back, slowly slide your foot toward your buttocks, bending your knee up off the floor (only go as far as is comfortable). Then slowly slide your foot back down until your leg is flat on the floor again. Angel Wings: Lying on your back spread your legs to the side as far apart as you can without causing discomfort.  A rehabilitation program following serious knee injuries can speed recovery and prevent re-injury in the future due to weakened muscles. Contact your doctor or a physical therapist for more information on knee rehabilitation.   POST-OPERATIVE OPIOID TAPER INSTRUCTIONS: It is important to wean off of your opioid medication as soon as possible. If you do not need pain medication after your surgery it is ok to stop day one. Opioids include: Codeine, Hydrocodone(Norco, Vicodin), Oxycodone(Percocet, oxycontin) and hydromorphone amongst others.  Long term and even short  term use of opiods can cause: Increased pain response Dependence Constipation Depression Respiratory depression And more.  Withdrawal symptoms can include Flu like symptoms Nausea, vomiting And more Techniques to manage these symptoms Hydrate well Eat regular healthy meals Stay active Use relaxation techniques(deep breathing, meditating, yoga) Do Not substitute Alcohol to help with tapering If you have been on opioids for less than two weeks and do not have pain than it is ok to stop all together.  Plan to wean off of opioids This plan should start within one week post op of your joint replacement. Maintain the same interval or time between taking each dose and first decrease the dose.  Cut the total daily intake of opioids by one tablet each day Next start to increase the time between doses. The last dose that should be eliminated is the evening dose.   IF YOU ARE TRANSFERRED TO A SKILLED REHAB FACILITY If the patient is transferred to a skilled rehab facility following release from the hospital, a list of the current medications will be sent to the facility for the patient to continue.  When discharged from the skilled rehab facility, please have the facility set up the patient's [place] prior to being released. Also, the skilled facility will be responsible for providing the patient with their medications at time of release from the facility to include their pain medication, the muscle relaxants, and their blood thinner medication. If the patient is still at the rehab facility at time of the two week follow up appointment, the skilled rehab facility will also need to assist the patient in arranging follow up appointment in our office and any transportation needs.  MAKE SURE YOU:  Understand these instructions.  Get help right away if you are not doing well or get worse.   DENTAL ANTIBIOTICS:  In most cases prophylactic antibiotics for Dental procdeures after  total joint surgery are not necessary.  Exceptions are as follows:  1. History of prior total joint infection  2. Severely immunocompromised (  Organ Transplant, cancer chemotherapy, Rheumatoid biologic meds such as [place])  3. Poorly controlled diabetes (A1C &gt; 8.0, blood glucose over 200)  If you have one of these conditions, contact your surgeon for an antibiotic prescription, prior to your dental procedure.    Pick up stool softner and laxative for home use following surgery while on pain medications. Do not submerge incision under water. Please use good hand washing techniques while changing dressing each day. May shower starting three days after surgery. Please use a clean towel to pat the incision dry following showers. Continue to use ice for pain and swelling after surgery. Do not use any lotions or creams on the incision until instructed by your surgeon.

## 2021-03-12 NOTE — Anesthesia Procedure Notes (Signed)
Anesthesia Regional Block: Adductor canal block   Pre-Anesthetic Checklist: , timeout performed,  Correct Patient, Correct Site, Correct Laterality,  Correct Procedure, Correct Position, site marked,  Risks and benefits discussed,  Surgical consent,  Pre-op evaluation,  At surgeon's request and post-op pain management  Laterality: Lower and Left  Prep: chloraprep       Needles:  Injection technique: Single-shot  Needle Type: Stimiplex     Needle Length: 9cm  Needle Gauge: 21     Additional Needles:   Procedures:,,,, ultrasound used (permanent image in chart),,    Narrative:  Start time: 03/12/2021 10:17 AM End time: 03/12/2021 10:37 AM Injection made incrementally with aspirations every 5 mL.  Performed by: Personally  Anesthesiologist: Nolon Nations, MD  Additional Notes: BP cuff, EKG monitors applied. Sedation begun. Artery and nerve location verified with ultrasound. Anesthetic injected incrementally (14ml), slowly, and after negative aspirations under direct u/s guidance. Good fascial/perineural spread. Tolerated well.

## 2021-03-12 NOTE — Op Note (Signed)
OPERATIVE REPORT-TOTAL KNEE ARTHROPLASTY   Pre-operative diagnosis- Osteoarthritis  Left knee(s)  Post-operative diagnosis- Osteoarthritis Left knee(s)  Procedure-  Left  Total Knee Arthroplasty  Surgeon- Dione Plover. Martell Mcfadyen, MD  Assistant- Theresa Duty, PA-C   Anesthesia-   Adductor canal block and spinal  EBL-50 mL   Drains None  Tourniquet time- 37 minutes @ 213 mm Hg  Complications- None  Condition-PACU - hemodynamically stable.   Brief Clinical Note   [name] [name] is a 81 y.o. year old male with end stage OA of his left knee with progressively worsening pain and dysfunction. He has constant pain, with activity and at rest and significant functional deficits with difficulties even with ADLs. He has had extensive non-op management including analgesics, injections of cortisone and viscosupplements, and home exercise program, but remains in significant pain with significant dysfunction. Radiographs show bone on bone arthritis medial and patellofemoral. He presents now for left Total Knee Arthroplasty.     Procedure in detail---   The patient is brought into the operating room and positioned supine on the operating table. After successful administration of  Adductor canal block and spinal,   a tourniquet is placed high on the  Left thigh(s) and the lower extremity is prepped and draped in the usual sterile fashion. Time out is performed by the operating team and then the  Left lower extremity is wrapped in Esmarch, knee flexed and the tourniquet inflated to 300 mmHg.       A midline incision is made with a ten blade through the subcutaneous tissue to the level of the extensor mechanism. A fresh blade is used to make a medial parapatellar arthrotomy. Soft tissue over the proximal medial tibia is subperiosteally elevated to the joint line with a knife and into the semimembranosus bursa with a Cobb elevator. Soft tissue over the proximal lateral tibia is elevated with attention  being paid to avoiding the patellar tendon on the tibial tubercle. The patella is everted, knee flexed 90 degrees and the ACL and PCL are removed. Findings are bone on bone medial and patellofemoral with large global osteophytes        The drill is used to create a starting hole in the distal femur and the canal is thoroughly irrigated with sterile saline to remove the fatty contents. The 5 degree Left  valgus alignment guide is placed into the femoral canal and the distal femoral cutting block is pinned to remove 10 mm off the distal femur. Resection is made with an oscillating saw.      The tibia is subluxed forward and the menisci are removed. The extramedullary alignment guide is placed referencing proximally at the medial aspect of the tibial tubercle and distally along the second metatarsal axis and tibial crest. The block is pinned to remove 31mm off the more deficient medial  side. Resection is made with an oscillating saw. Size 4is the most appropriate size for the tibia and the proximal tibia is prepared with the modular drill and keel punch for that size.      The femoral sizing guide is placed and size 5 is most appropriate. Rotation is marked off the epicondylar axis and confirmed by creating a rectangular flexion gap at 90 degrees. The size 5 cutting block is pinned in this rotation and the anterior, posterior and chamfer cuts are made with the oscillating saw. The intercondylar block is then placed and that cut is made.      Trial size 4 tibial component, trial  size 5 posterior stabilized femur and a 12.5  mm posterior stabilized rotating platform insert trial is placed. Full extension is achieved with excellent varus/valgus and anterior/posterior balance throughout full range of motion. The patella is everted and thickness measured to be 27  mm. Free hand resection is taken to 15 mm, a 41 template is placed, lug holes are drilled, trial patella is placed, and it tracks normally. Osteophytes are  removed off the posterior femur with the trial in place. All trials are removed and the cut bone surfaces prepared with pulsatile lavage. Cement is mixed and once ready for implantation, the size 4 tibial implant, size  5 posterior stabilized femoral component, and the size 41 patella are cemented in place and the patella is held with the clamp. The trial insert is placed and the knee held in full extension. The Exparel (20 ml mixed with 60 ml saline) is injected into the extensor mechanism, posterior capsule, medial and lateral gutters and subcutaneous tissues.  All extruded cement is removed and once the cement is hard the permanent 12.5 mm posterior stabilized rotating platform insert is placed into the tibial tray.      The wound is copiously irrigated with saline solution and the extensor mechanism closed with # 0 Stratofix suture. The tourniquet is released for a total tourniquet time of 37  minutes. Flexion against gravity is 140 degrees and the patella tracks normally. Subcutaneous tissue is closed with 2.0 vicryl and subcuticular with running 4.0 Monocryl. The incision is cleaned and dried and steri-strips and a bulky sterile dressing are applied. The limb is placed into a knee immobilizer and the patient is awakened and transported to recovery in stable condition.      Please note that a surgical assistant was a medical necessity for this procedure in order to perform it in a safe and expeditious manner. Surgical assistant was necessary to retract the ligaments and vital neurovascular structures to prevent injury to them and also necessary for proper positioning of the limb to allow for anatomic placement of the prosthesis.   Dione Plover Hyatt Capobianco, MD    03/12/2021, 12:19 PM

## 2021-03-12 NOTE — Progress Notes (Signed)
AssistedDr. Lissa Hoard with left, ultrasound guided, adductor canal block. Side rails up, monitors on throughout procedure. See vital signs in flow sheet. Tolerated Procedure well.

## 2021-03-13 ENCOUNTER — Encounter (HOSPITAL_COMMUNITY): Payer: Self-pay | Admitting: Orthopedic Surgery

## 2021-03-13 DIAGNOSIS — I1310 Hypertensive heart and chronic kidney disease without heart failure, with stage 1 through stage 4 chronic kidney disease, or unspecified chronic kidney disease: Secondary | ICD-10-CM | POA: Diagnosis not present

## 2021-03-13 DIAGNOSIS — Z7901 Long term (current) use of anticoagulants: Secondary | ICD-10-CM | POA: Diagnosis not present

## 2021-03-13 DIAGNOSIS — N182 Chronic kidney disease, stage 2 (mild): Secondary | ICD-10-CM | POA: Diagnosis not present

## 2021-03-13 DIAGNOSIS — I25110 Atherosclerotic heart disease of native coronary artery with unstable angina pectoris: Secondary | ICD-10-CM | POA: Diagnosis not present

## 2021-03-13 DIAGNOSIS — Z7982 Long term (current) use of aspirin: Secondary | ICD-10-CM | POA: Diagnosis not present

## 2021-03-13 DIAGNOSIS — Z8546 Personal history of malignant neoplasm of prostate: Secondary | ICD-10-CM | POA: Diagnosis not present

## 2021-03-13 DIAGNOSIS — Z951 Presence of aortocoronary bypass graft: Secondary | ICD-10-CM | POA: Diagnosis not present

## 2021-03-13 DIAGNOSIS — M1712 Unilateral primary osteoarthritis, left knee: Secondary | ICD-10-CM | POA: Diagnosis not present

## 2021-03-13 DIAGNOSIS — Z79899 Other long term (current) drug therapy: Secondary | ICD-10-CM | POA: Diagnosis not present

## 2021-03-13 LAB — BASIC METABOLIC PANEL
Anion gap: 3 — ABNORMAL LOW (ref 5–15)
BUN: 24 mg/dL — ABNORMAL HIGH (ref 8–23)
CO2: 27 mmol/L (ref 22–32)
Calcium: 8.6 mg/dL — ABNORMAL LOW (ref 8.9–10.3)
Chloride: 107 mmol/L (ref 98–111)
Creatinine, Ser: 0.97 mg/dL (ref 0.61–1.24)
GFR, Estimated: >60 mL/min (ref >60)
Glucose, Bld: 164 mg/dL — ABNORMAL HIGH (ref 70–99)
Potassium: 4.7 mmol/L (ref 3.5–5.1)
Sodium: 137 mmol/L (ref 135–145)

## 2021-03-13 LAB — CBC
HCT: 33.6 % — ABNORMAL LOW (ref 39.0–52.0)
Hemoglobin: 11.6 g/dL — ABNORMAL LOW (ref 13.0–17.0)
MCH: 33.8 pg (ref 26.0–34.0)
MCHC: 34.5 g/dL (ref 30.0–36.0)
MCV: 98 fL (ref 80.0–100.0)
Platelets: 151 10*3/uL (ref 150–400)
RBC: 3.43 MIL/uL — ABNORMAL LOW (ref 4.22–5.81)
RDW: 12.2 % (ref 11.5–15.5)
WBC: 13.5 10*3/uL — ABNORMAL HIGH (ref 4.0–10.5)
nRBC: 0 % (ref 0.0–0.2)

## 2021-03-13 MED ORDER — RIVAROXABAN 10 MG PO TABS: 10.0000 mg | ORAL_TABLET | Freq: Every day | ORAL | 0 refills | Status: AC

## 2021-03-13 MED ORDER — METHOCARBAMOL 500 MG PO TABS: 500.0000 mg | ORAL_TABLET | Freq: Four times a day (QID) | ORAL | 0 refills | Status: DC | PRN

## 2021-03-13 MED ORDER — OXYCODONE HCL 5 MG PO TABS: 5.0000 mg | ORAL_TABLET | Freq: Four times a day (QID) | ORAL | 0 refills | Status: DC | PRN

## 2021-03-13 MED ORDER — SODIUM CHLORIDE 0.9 % IV BOLUS
250.0000 mL | Freq: Once | INTRAVENOUS | Status: AC
  Administered 2021-03-13: 250 mL via INTRAVENOUS

## 2021-03-13 MED ORDER — TRAMADOL HCL 50 MG PO TABS: 50.0000 mg | ORAL_TABLET | Freq: Four times a day (QID) | ORAL | 0 refills | Status: DC | PRN

## 2021-03-13 NOTE — Progress Notes (Signed)
Physical Therapy Treatment Patient Details Name: [name] [name] MRN: [id number] DOB: 20-Jun-1940 Today's Date: 03/13/2021   History of Present Illness 81 yo s/p L TKA on 02/09/21. PMH: HTN, CABG x4, CKD, aortic valve replacement, inguinal hernia repair    PT Comments    Pt progressing well today. No dizziness with gait-hallway distance. Will see again to review HEP.   Recommendations for follow up therapy are one component of a multi-disciplinary discharge planning process, led by the attending physician.  Recommendations may be updated based on patient status, additional functional criteria and insurance authorization.  Follow Up Recommendations  Follow physician's recommendations for discharge plan and follow up therapies     Assistance Recommended at Discharge Intermittent Supervision/Assistance  Patient can return home with the following Assistance with cooking/housework;Help with stairs or ramp for entrance;Assist for transportation   Equipment Recommendations  None recommended by PT    Recommendations for Other Services       Precautions / Restrictions Precautions Precautions: Fall;Knee Precaution Comments: able to perform IND SLR Restrictions Other Position/Activity Restrictions: WBAT     Mobility  Bed Mobility               General bed mobility comments: in chair    Transfers Overall transfer level: Needs assistance Equipment used: Rolling walker (2 wheels) Transfers: Sit to/from Stand Sit to Stand: Min guard, Supervision           General transfer comment: min/guard to rise and transition to RW, cues for hand placement and LLE position    Ambulation/Gait Ambulation/Gait assistance: Min guard Gait Distance (Feet): 140 Feet Assistive device: Rolling walker (2 wheels) Gait Pattern/deviations: Step-to pattern, Decreased stance time - left       General Gait Details: cues for sequence, RW position; min/guard for safety   Stairs              Wheelchair Mobility    Modified Rankin (Stroke Patients Only)       Balance                                            Cognition Arousal/Alertness: Awake/alert Behavior During Therapy: WFL for tasks assessed/performed Overall Cognitive Status: Within Functional Limits for tasks assessed                                          Exercises      General Comments        Pertinent Vitals/Pain Pain Assessment Pain Assessment: 0-10 Pain Score: 3  Pain Location: L knee Pain Descriptors / Indicators: Aching, Grimacing, Sore Pain Intervention(s): Limited activity within patient's tolerance, Monitored during session, Premedicated before session, Repositioned, Ice applied    Home Living                          Prior Function            PT Goals (current goals can now be found in the care plan section) Acute Rehab PT Goals Patient Stated Goal: home, less knee pain PT Goal Formulation: With patient Time For Goal Achievement: 03/19/21 Potential to Achieve Goals: Good Progress towards PT goals: Progressing toward goals    Frequency    7X/week      PT  Plan Current plan remains appropriate    Co-evaluation              AM-PAC PT "6 Clicks" Mobility   Outcome Measure  Help needed turning from your back to your side while in a flat bed without using bedrails?: A Little Help needed moving from lying on your back to sitting on the side of a flat bed without using bedrails?: A Little Help needed moving to and from a bed to a chair (including a wheelchair)?: A Little Help needed standing up from a chair using your arms (e.g., wheelchair or bedside chair)?: A Little Help needed to walk in hospital room?: A Little Help needed climbing 3-5 steps with a railing? : A Little 6 Click Score: 18    End of Session Equipment Utilized During Treatment: Gait belt Activity Tolerance: Patient tolerated treatment well Patient  left: in chair;with call bell/phone within reach;with chair alarm set   PT Visit Diagnosis: Other abnormalities of gait and mobility (R26.89);Difficulty in walking, not elsewhere classified (R26.2)     Time: 7001-7494 PT Time Calculation (min) (ACUTE ONLY): 18 min  Charges:  $Gait Training: 8-22 mins                     Baxter Flattery, PT  Acute Rehab Dept ([place]) [phone] Pager [phone]  03/13/2021    [facility] 03/13/2021, 1:08 PM

## 2021-03-13 NOTE — Progress Notes (Signed)
°   03/13/21 1300  PT Visit Information  Last PT Received On 03/13/21  Reviewed TKA HEP with pt and wife. Pt able to return demo and is progressing well with knee ROM. Reviewed not doing too much too soon prior to OPPT, reviewed importance of mobility progression  Assistance Needed +1  History of Present Illness 81 yo s/p L TKA on 02/09/21. PMH: HTN, CABG x4, CKD, aortic valve replacement, inguinal hernia repair  Subjective Data  Patient Stated Goal home, less knee pain  Precautions  Precautions Fall;Knee  Precaution Comments able to perform IND SLR  Restrictions  Other Position/Activity Restrictions WBAT  Pain Assessment  Pain Assessment 0-10  Pain Score 3  Pain Location L knee  Pain Descriptors / Indicators Aching;Grimacing;Sore  Pain Intervention(s) Limited activity within patient's tolerance;Monitored during session;Premedicated before session;Repositioned;Ice applied  Cognition  Arousal/Alertness Awake/alert  Behavior During Therapy WFL for tasks assessed/performed  Overall Cognitive Status Within Functional Limits for tasks assessed  Bed Mobility  General bed mobility comments in chair  Total Joint Exercises  Ankle Circles/Pumps AROM;Both;10 reps  Quad Sets AROM;Both;10 reps  Straight Leg Raises AROM;Strengthening;Left;10 reps  Heel Slides AAROM;Left;10 reps  Hip ABduction/ADduction AROM;Strengthening;Left;10 reps  Goniometric ROM ~ 12 to 80 degrees left knee flexion  PT - End of Session  Equipment Utilized During Treatment Gait belt  Activity Tolerance Patient tolerated treatment well  Patient left in chair;with call bell/phone within reach;with chair alarm set;with family/visitor present   PT - Assessment/Plan  PT Plan Current plan remains appropriate  PT Visit Diagnosis Other abnormalities of gait and mobility (R26.89);Difficulty in walking, not elsewhere classified (R26.2)  PT Frequency (ACUTE ONLY) 7X/week  Follow Up Recommendations Follow physician's recommendations  for discharge plan and follow up therapies  Assistance recommended at discharge Intermittent Supervision/Assistance  Patient can return home with the following Assistance with cooking/housework;Help with stairs or ramp for entrance;Assist for transportation  PT equipment None recommended by PT  AM-PAC PT "6 Clicks" Mobility Outcome Measure (Version 2)  Help needed turning from your back to your side while in a flat bed without using bedrails? 3  Help needed moving from lying on your back to sitting on the side of a flat bed without using bedrails? 3  Help needed moving to and from a bed to a chair (including a wheelchair)? 3  Help needed standing up from a chair using your arms (e.g., wheelchair or bedside chair)? 3  Help needed to walk in hospital room? 3  Help needed climbing 3-5 steps with a railing?  3  6 Click Score 18  Consider Recommendation of Discharge To: Home with [facility]  PT Goal Progression  Progress towards PT goals Progressing toward goals  Acute Rehab PT Goals  PT Goal Formulation With patient  Time For Goal Achievement 03/19/21  Potential to Achieve Goals Good  PT Time Calculation  PT Start Time (ACUTE ONLY) 1242  PT Stop Time (ACUTE ONLY) 1301  PT Time Calculation (min) (ACUTE ONLY) 19 min  PT General Charges  $$ ACUTE PT VISIT 1 Visit  PT Treatments  $Therapeutic Exercise 8-22 mins

## 2021-03-13 NOTE — Plan of Care (Signed)
°  Problem: Health Behavior/Discharge Planning: Goal: Ability to manage health-related needs will improve Outcome: Adequate for Discharge   Problem: Clinical Measurements: Goal: Ability to maintain clinical measurements within normal limits will improve Outcome: Adequate for Discharge Goal: Will remain free from infection Outcome: Adequate for Discharge Goal: Diagnostic test results will improve Outcome: Adequate for Discharge Goal: Respiratory complications will improve Outcome: Adequate for Discharge Goal: Cardiovascular complication will be avoided Outcome: Adequate for Discharge   Problem: Activity: Goal: Risk for activity intolerance will decrease Outcome: Adequate for Discharge   Problem: Nutrition: Goal: Adequate nutrition will be maintained Outcome: Adequate for Discharge   Problem: Coping: Goal: Level of anxiety will decrease Outcome: Adequate for Discharge   Problem: Elimination: Goal: Will not experience complications related to bowel motility Outcome: Adequate for Discharge Goal: Will not experience complications related to urinary retention Outcome: Adequate for Discharge   Problem: Pain Managment: Goal: General experience of comfort will improve Outcome: Adequate for Discharge   Problem: Safety: Goal: Ability to remain free from injury will improve Outcome: Adequate for Discharge   Problem: Skin Integrity: Goal: Risk for impaired skin integrity will decrease Outcome: Adequate for Discharge   Problem: Education: Goal: Knowledge of the prescribed therapeutic regimen will improve Outcome: Adequate for Discharge   Problem: Activity: Goal: Ability to avoid complications of mobility impairment will improve Outcome: Adequate for Discharge Goal: Range of joint motion will improve Outcome: Adequate for Discharge   Problem: Clinical Measurements: Goal: Postoperative complications will be avoided or minimized Outcome: Adequate for Discharge   Problem:  Pain Management: Goal: Pain level will decrease with appropriate interventions Outcome: Adequate for Discharge   Problem: Skin Integrity: Goal: Will show signs of wound healing Outcome: Adequate for Discharge   Problem: Acute Rehab PT Goals(only PT should resolve) Goal: Pt Will Go Supine/Side To Sit Outcome: Adequate for Discharge Goal: Patient Will Transfer Sit To/From Stand Outcome: Adequate for Discharge Goal: Pt Will Ambulate Outcome: Adequate for Discharge

## 2021-03-13 NOTE — TOC Transition Note (Signed)
Transition of Care [facility]) - CM/SW Discharge Note   Patient Details  Name: [name] [name] MRN: [id number] Date of Birth: 12-16-1940  Transition of Care [facility]) CM/SW Contact:  Lennart Pall, LCSW Phone Number: 03/13/2021, 9:41 AM   Clinical Narrative:    Met briefly with pt and confirming he has all needed DME at home.  Plan for OP @ [facility].  No TOC needs.   Final next level of care: OP Rehab Barriers to Discharge: No Barriers Identified   Patient Goals and CMS Choice Patient states their goals for this hospitalization and ongoing recovery are:: return home      Discharge Placement                       Discharge Plan and Services                DME Arranged: N/A DME Agency: NA       HH Arranged: NA HH Agency: NA        Social Determinants of Health (SDOH) Interventions     Readmission Risk Interventions No flowsheet data found.

## 2021-03-13 NOTE — Progress Notes (Signed)
Subjective: 1 Day Post-Op Procedure(s) (LRB): TOTAL KNEE ARTHROPLASTY (Left) Patient reports pain as mild.   Patient seen in rounds by Dr. Wynelle Link. Patient had issues with hypotension following physical therapy session yesterday. Received bolus with improvement Feels well this morning. Denies chest pain, SOB, or calf pain. Foley catheter removed this AM.  We will continue therapy today, ambulated 40' yesterday.   Objective: Vital signs in last 24 hours: Temp:  [97.5 F (36.4 C)-98.2 F (36.8 C)] 97.9 F (36.6 C) (02/21 0501) Pulse Rate:  [51-65] 63 (02/21 0501) Resp:  [12-21] 17 (02/21 0501) BP: (59-187)/(47-111) 118/57 (02/21 0501) SpO2:  [97 %-100 %] 97 % (02/21 0501) Weight:  [74.9 kg] 74.9 kg (02/20 0859)  Intake/Output from previous day:  Intake/Output Summary (Last 24 hours) at 03/13/2021 0740 Last data filed at 03/13/2021 0600 Gross per 24 hour  Intake 4671 ml  Output 3025 ml  Net 1646 ml     Intake/Output this shift: No intake/output data recorded.  Labs: Recent Labs    03/13/21 0312  HGB 11.6*   Recent Labs    03/13/21 0312  WBC 13.5*  RBC 3.43*  HCT 33.6*  PLT 151   Recent Labs    03/13/21 0312  NA 137  K 4.7  CL 107  CO2 27  BUN 24*  CREATININE 0.97  GLUCOSE 164*  CALCIUM 8.6*   No results for input(s): LABPT, INR in the last 72 hours.  Exam: General - Patient is Alert and Oriented Extremity - Neurologically intact Neurovascular intact Sensation intact distally Dorsiflexion/Plantar flexion intact Dressing - dressing C/D/I Motor Function - intact, moving foot and toes well on exam.   Past Medical History:  Diagnosis Date   A-fib ([place])    Arthritis    knee   Cataracts, bilateral    Chronic kidney disease    stage 2   Coronary artery disease    Fatigue 04/05/2020   GERD (gastroesophageal reflux disease)    Glaucoma    Hearing loss    wears hearing aids   History of kidney stones    passed stones and 1 time had surgery    Hyperlipidemia    Hypertension    Inguinal hernia 03/2019   Prostate cancer ([place])    Pure hypercholesterolemia 03/23/2019   S/P aortic valve replacement with bioprosthetic valve 03/23/2019   Bioprosthetic 09/2014   Shingles    Varicose veins 03/31/2015   Wears partial dentures    upper    Assessment/Plan: 1 Day Post-Op Procedure(s) (LRB): TOTAL KNEE ARTHROPLASTY (Left) Principal Problem:   OA (osteoarthritis) of knee Active Problems:   Primary osteoarthritis of left knee  Estimated body mass index is 21.8 kg/m as calculated from the following:   Height as of this encounter: 6\' 1"  (1.854 m).   Weight as of this encounter: 74.9 kg. Advance diet Up with therapy   Patient's anticipated LOS is less than 2 midnights, meeting these requirements: - Lives within 1 hour of care - Has a competent adult at home to recover with post-op recover - NO history of  - Chronic pain requiring opiods  - Diabetes  - Heart failure  - Stroke  - DVT/VTE  - Cardiac arrhythmia  - Respiratory Failure/COPD  - Renal failure  - Anemia  - Advanced Liver disease  DVT Prophylaxis - Xarelto Weight bearing as tolerated. Continue therapy.  BP soft this AM, will order additional bolus prior to PT.  Plan is to go Home after hospital stay. Plan  for discharge later today if progresses with therapy and meeting goals. Scheduled for OPPT at [facility]. Follow-up in the office in 2 weeks.  The PDMP database was reviewed today prior to any opioid medications being prescribed to this patient.  Theresa Duty, PA-C Orthopedic Surgery [phone] 03/13/2021, 7:40 AM

## 2021-03-15 NOTE — Discharge Summary (Signed)
Patient ID: [name] [name] MRN: [id number] DOB/AGE: 82-Nov-1942 82 y.o.  Admit date: 03/12/2021 Discharge date: 03/13/2021  Admission Diagnoses:  Principal Problem:   OA (osteoarthritis) of knee Active Problems:   Primary osteoarthritis of left knee   Discharge Diagnoses:  Same  Past Medical History:  Diagnosis Date   A-fib ([place])    Arthritis    knee   Cataracts, bilateral    Chronic kidney disease    stage 2   Coronary artery disease    Fatigue 04/05/2020   GERD (gastroesophageal reflux disease)    Glaucoma    Hearing loss    wears hearing aids   History of kidney stones    passed stones and 1 time had surgery   Hyperlipidemia    Hypertension    Inguinal hernia 03/2019   Prostate cancer ([place])    Pure hypercholesterolemia 03/23/2019   S/P aortic valve replacement with bioprosthetic valve 03/23/2019   Bioprosthetic 09/2014   Shingles    Varicose veins 03/31/2015   Wears partial dentures    upper    Surgeries: Procedure(s): TOTAL KNEE ARTHROPLASTY on 03/12/2021   Consultants:   Discharged Condition: Improved  Hospital Course: [name] [name] is an 81 y.o. male who was admitted 03/12/2021 for operative treatment ofOA (osteoarthritis) of knee. Patient has severe unremitting pain that affects sleep, daily activities, and work/hobbies. After pre-op clearance the patient was taken to the operating room on 03/12/2021 and underwent  Procedure(s): TOTAL KNEE ARTHROPLASTY.    Patient was given perioperative antibiotics:  Anti-infectives (From admission, onward)    Start     Dose/Rate Route Frequency Ordered Stop   03/12/21 1730  ceFAZolin (ANCEF) IVPB 2g/100 mL premix        2 g 200 mL/hr over 30 Minutes Intravenous Every 6 hours 03/12/21 1504 03/12/21 2349   03/12/21 0845  ceFAZolin (ANCEF) IVPB 2g/100 mL premix        2 g 200 mL/hr over 30 Minutes Intravenous On call to O.R. 03/12/21 1540 03/12/21 1147        Patient was given sequential compression devices,  early ambulation, and chemoprophylaxis to prevent DVT.  Patient benefited maximally from hospital stay and there were no complications.    Recent vital signs: No data found.   Recent laboratory studies:  Recent Labs    03/13/21 0312  WBC 13.5*  HGB 11.6*  HCT 33.6*  PLT 151  NA 137  K 4.7  CL 107  CO2 27  BUN 24*  CREATININE 0.97  GLUCOSE 164*  CALCIUM 8.6*     Discharge Medications:   Allergies as of 03/13/2021       Reactions   Hydrochlorothiazide Swelling        Medication List     STOP taking these medications    aspirin EC 81 MG tablet   Lidocaine 4 % Ptch   meloxicam 15 MG tablet Commonly known as: MOBIC   multivitamin with minerals tablet       TAKE these medications    acetaminophen 500 MG tablet Commonly known as: TYLENOL Take 1,000 mg by mouth every 6 (six) hours as needed for headache (pain).   acyclovir 800 MG tablet Commonly known as: ZOVIRAX Take 400 mg by mouth in the morning and at bedtime.   atorvastatin 40 MG tablet Commonly known as: LIPITOR TAKE 1 TABLET AT BEDTIME   carvedilol 6.25 MG tablet Commonly known as: COREG TAKE 1 TABLET TWICE DAILY   esomeprazole 20 MG capsule Commonly  known as: NEXIUM Take 20 mg by mouth every other day. Reported on 03/01/2015   lisinopril 20 MG tablet Commonly known as: ZESTRIL TAKE 1 TABLET TWICE DAILY   methimazole 5 MG tablet Commonly known as: TAPAZOLE Take 0.5 tablets (2.5 mg total) by mouth daily.   methocarbamol 500 MG tablet Commonly known as: ROBAXIN Take 1 tablet (500 mg total) by mouth every 6 (six) hours as needed for muscle spasms.   oxybutynin 5 MG tablet Commonly known as: DITROPAN Take 5 mg by mouth 2 (two) times daily.   oxyCODONE 5 MG immediate release tablet Commonly known as: Oxy IR/ROXICODONE Take 1-2 tablets (5-10 mg total) by mouth every 6 (six) hours as needed for severe pain.   prednisoLONE acetate 1 % ophthalmic suspension Commonly known as: PRED  FORTE Place 1 drop into the left eye in the morning and at bedtime.   rivaroxaban 10 MG Tabs tablet Commonly known as: XARELTO Take 1 tablet (10 mg total) by mouth daily with breakfast for 20 days. Then resume one 81 mg aspirin once a day.   spironolactone 25 MG tablet Commonly known as: ALDACTONE Take 12.5 mg by mouth daily. Take 1/2 tablet daily   timolol 0.5 % ophthalmic solution Commonly known as: TIMOPTIC Place 1 drop into the left eye 2 (two) times daily.   traMADol 50 MG tablet Commonly known as: ULTRAM Take 1-2 tablets (50-100 mg total) by mouth every 6 (six) hours as needed for moderate pain.               Discharge Care Instructions  (From admission, onward)           Start     Ordered   03/13/21 0000  Weight bearing as tolerated        03/13/21 0744   03/13/21 0000  Change dressing       Comments: You may remove the bulky bandage (ACE wrap and gauze) two days after surgery. You will have an adhesive waterproof bandage underneath. Leave this in place until your first follow-up appointment.   03/13/21 0744            Diagnostic Studies: No results found.  Disposition: Discharge disposition: 01-Home or Self Care       Discharge Instructions     Call MD / Call 911   Complete by: As directed    If you experience chest pain or shortness of breath, CALL 911 and be transported to the hospital emergency room.  If you develope a fever above 101 F, pus (white drainage) or increased drainage or redness at the wound, or calf pain, call your surgeon's office.   Change dressing   Complete by: As directed    You may remove the bulky bandage (ACE wrap and gauze) two days after surgery. You will have an adhesive waterproof bandage underneath. Leave this in place until your first follow-up appointment.   Constipation Prevention   Complete by: As directed    Drink plenty of fluids.  Prune juice may be helpful.  You may use a stool softener, such as Colace  (over the counter) 100 mg twice a day.  Use MiraLax (over the counter) for constipation as needed.   Diet - low sodium heart healthy   Complete by: As directed    Do not put a pillow under the knee. Place it under the heel.   Complete by: As directed    Driving restrictions   Complete by: As directed  No driving for two weeks   Post-operative opioid taper instructions:   Complete by: As directed    POST-OPERATIVE OPIOID TAPER INSTRUCTIONS: It is important to wean off of your opioid medication as soon as possible. If you do not need pain medication after your surgery it is ok to stop day one. Opioids include: Codeine, Hydrocodone(Norco, Vicodin), Oxycodone(Percocet, oxycontin) and hydromorphone amongst others.  Long term and even short term use of opiods can cause: Increased pain response Dependence Constipation Depression Respiratory depression And more.  Withdrawal symptoms can include Flu like symptoms Nausea, vomiting And more Techniques to manage these symptoms Hydrate well Eat regular healthy meals Stay active Use relaxation techniques(deep breathing, meditating, yoga) Do Not substitute Alcohol to help with tapering If you have been on opioids for less than two weeks and do not have pain than it is ok to stop all together.  Plan to wean off of opioids This plan should start within one week post op of your joint replacement. Maintain the same interval or time between taking each dose and first decrease the dose.  Cut the total daily intake of opioids by one tablet each day Next start to increase the time between doses. The last dose that should be eliminated is the evening dose.      TED hose   Complete by: As directed    Use stockings (TED hose) for three weeks on both leg(s).  You may remove them at night for sleeping.   Weight bearing as tolerated   Complete by: As directed         Follow-up Information     Gaynelle Arabian, MD. Go on 03/28/2021.   Specialty:  Orthopedic Surgery Why: You are scheduled for a follow up appointment on 03-28-21 at 3:30 pm. Contact information: [address] [place] [address] [phone]                  Signed: Theresa Duty 03/15/2021, 12:44 PM

## 2021-03-16 DIAGNOSIS — M25562 Pain in left knee: Secondary | ICD-10-CM | POA: Diagnosis not present

## 2021-03-21 DIAGNOSIS — Z471 Aftercare following joint replacement surgery: Secondary | ICD-10-CM | POA: Diagnosis not present

## 2021-03-21 DIAGNOSIS — Z4789 Encounter for other orthopedic aftercare: Secondary | ICD-10-CM | POA: Diagnosis not present

## 2021-03-21 DIAGNOSIS — M25562 Pain in left knee: Secondary | ICD-10-CM | POA: Diagnosis not present

## 2021-03-27 DIAGNOSIS — M25562 Pain in left knee: Secondary | ICD-10-CM | POA: Diagnosis not present

## 2021-03-30 DIAGNOSIS — M25562 Pain in left knee: Secondary | ICD-10-CM | POA: Diagnosis not present

## 2021-04-03 ENCOUNTER — Other Ambulatory Visit (HOSPITAL_COMMUNITY): Payer: Medicare HMO

## 2021-04-03 DIAGNOSIS — M25562 Pain in left knee: Secondary | ICD-10-CM | POA: Diagnosis not present

## 2021-04-06 DIAGNOSIS — M25562 Pain in left knee: Secondary | ICD-10-CM | POA: Diagnosis not present

## 2021-04-10 DIAGNOSIS — M25562 Pain in left knee: Secondary | ICD-10-CM | POA: Diagnosis not present

## 2021-04-13 DIAGNOSIS — M25562 Pain in left knee: Secondary | ICD-10-CM | POA: Diagnosis not present

## 2021-04-17 DIAGNOSIS — M25562 Pain in left knee: Secondary | ICD-10-CM | POA: Diagnosis not present

## 2021-04-17 DIAGNOSIS — Z471 Aftercare following joint replacement surgery: Secondary | ICD-10-CM | POA: Diagnosis not present

## 2021-04-17 DIAGNOSIS — M169 Osteoarthritis of hip, unspecified: Secondary | ICD-10-CM | POA: Diagnosis not present

## 2021-04-17 DIAGNOSIS — Z4789 Encounter for other orthopedic aftercare: Secondary | ICD-10-CM | POA: Diagnosis not present

## 2021-04-18 DIAGNOSIS — L821 Other seborrheic keratosis: Secondary | ICD-10-CM | POA: Diagnosis not present

## 2021-04-18 DIAGNOSIS — L298 Other pruritus: Secondary | ICD-10-CM | POA: Diagnosis not present

## 2021-04-18 DIAGNOSIS — L538 Other specified erythematous conditions: Secondary | ICD-10-CM | POA: Diagnosis not present

## 2021-04-18 DIAGNOSIS — R208 Other disturbances of skin sensation: Secondary | ICD-10-CM | POA: Diagnosis not present

## 2021-04-18 DIAGNOSIS — L814 Other melanin hyperpigmentation: Secondary | ICD-10-CM | POA: Diagnosis not present

## 2021-04-18 DIAGNOSIS — C44629 Squamous cell carcinoma of skin of left upper limb, including shoulder: Secondary | ICD-10-CM | POA: Diagnosis not present

## 2021-04-18 DIAGNOSIS — Z08 Encounter for follow-up examination after completed treatment for malignant neoplasm: Secondary | ICD-10-CM | POA: Diagnosis not present

## 2021-04-18 DIAGNOSIS — D1801 Hemangioma of skin and subcutaneous tissue: Secondary | ICD-10-CM | POA: Diagnosis not present

## 2021-04-18 DIAGNOSIS — Z85828 Personal history of other malignant neoplasm of skin: Secondary | ICD-10-CM | POA: Diagnosis not present

## 2021-04-18 DIAGNOSIS — L820 Inflamed seborrheic keratosis: Secondary | ICD-10-CM | POA: Diagnosis not present

## 2021-04-20 DIAGNOSIS — M25562 Pain in left knee: Secondary | ICD-10-CM | POA: Diagnosis not present

## 2021-05-04 DIAGNOSIS — H401123 Primary open-angle glaucoma, left eye, severe stage: Secondary | ICD-10-CM | POA: Diagnosis not present

## 2021-05-04 DIAGNOSIS — Z947 Corneal transplant status: Secondary | ICD-10-CM | POA: Diagnosis not present

## 2021-05-04 DIAGNOSIS — H40021 Open angle with borderline findings, high risk, right eye: Secondary | ICD-10-CM | POA: Diagnosis not present

## 2021-05-04 DIAGNOSIS — H04122 Dry eye syndrome of left lacrimal gland: Secondary | ICD-10-CM | POA: Diagnosis not present

## 2021-05-04 DIAGNOSIS — Z961 Presence of intraocular lens: Secondary | ICD-10-CM | POA: Diagnosis not present

## 2021-05-07 DIAGNOSIS — Z947 Corneal transplant status: Secondary | ICD-10-CM | POA: Diagnosis not present

## 2021-05-07 DIAGNOSIS — Z961 Presence of intraocular lens: Secondary | ICD-10-CM | POA: Diagnosis not present

## 2021-05-07 DIAGNOSIS — H04122 Dry eye syndrome of left lacrimal gland: Secondary | ICD-10-CM | POA: Diagnosis not present

## 2021-05-07 DIAGNOSIS — H40021 Open angle with borderline findings, high risk, right eye: Secondary | ICD-10-CM | POA: Diagnosis not present

## 2021-05-07 DIAGNOSIS — H401123 Primary open-angle glaucoma, left eye, severe stage: Secondary | ICD-10-CM | POA: Diagnosis not present

## 2021-05-14 ENCOUNTER — Other Ambulatory Visit: Payer: Self-pay | Admitting: General Practice

## 2021-05-29 DIAGNOSIS — C44629 Squamous cell carcinoma of skin of left upper limb, including shoulder: Secondary | ICD-10-CM | POA: Diagnosis not present

## 2021-05-29 DIAGNOSIS — L905 Scar conditions and fibrosis of skin: Secondary | ICD-10-CM | POA: Diagnosis not present

## 2021-06-27 ENCOUNTER — Emergency Department (HOSPITAL_COMMUNITY)
Admission: EM | Admit: 2021-06-27 | Discharge: 2021-06-27 | Disposition: A | Discharge status: Home / Self Care | Payer: Medicare HMO | Attending: Emergency Medicine | Admitting: Emergency Medicine

## 2021-06-27 ENCOUNTER — Emergency Department (HOSPITAL_COMMUNITY): Payer: Medicare HMO

## 2021-06-27 ENCOUNTER — Other Ambulatory Visit: Payer: Self-pay

## 2021-06-27 DIAGNOSIS — Y990 Civilian activity done for income or pay: Secondary | ICD-10-CM | POA: Diagnosis not present

## 2021-06-27 DIAGNOSIS — S50812A Abrasion of left forearm, initial encounter: Secondary | ICD-10-CM | POA: Insufficient documentation

## 2021-06-27 DIAGNOSIS — T63484A Toxic effect of venom of other arthropod, undetermined, initial encounter: Secondary | ICD-10-CM | POA: Diagnosis not present

## 2021-06-27 DIAGNOSIS — I672 Cerebral atherosclerosis: Secondary | ICD-10-CM | POA: Diagnosis not present

## 2021-06-27 DIAGNOSIS — I6523 Occlusion and stenosis of bilateral carotid arteries: Secondary | ICD-10-CM | POA: Diagnosis not present

## 2021-06-27 DIAGNOSIS — W010XXA Fall on same level from slipping, tripping and stumbling without subsequent striking against object, initial encounter: Secondary | ICD-10-CM | POA: Insufficient documentation

## 2021-06-27 DIAGNOSIS — S8002XA Contusion of left knee, initial encounter: Secondary | ICD-10-CM | POA: Insufficient documentation

## 2021-06-27 DIAGNOSIS — S0993XA Unspecified injury of face, initial encounter: Secondary | ICD-10-CM | POA: Diagnosis present

## 2021-06-27 DIAGNOSIS — R609 Edema, unspecified: Secondary | ICD-10-CM | POA: Diagnosis not present

## 2021-06-27 DIAGNOSIS — W19XXXA Unspecified fall, initial encounter: Secondary | ICD-10-CM

## 2021-06-27 DIAGNOSIS — S0240CA Maxillary fracture, right side, initial encounter for closed fracture: Secondary | ICD-10-CM | POA: Diagnosis not present

## 2021-06-27 DIAGNOSIS — M542 Cervicalgia: Secondary | ICD-10-CM | POA: Diagnosis not present

## 2021-06-27 DIAGNOSIS — S022XXA Fracture of nasal bones, initial encounter for closed fracture: Secondary | ICD-10-CM | POA: Diagnosis not present

## 2021-06-27 DIAGNOSIS — R58 Hemorrhage, not elsewhere classified: Secondary | ICD-10-CM | POA: Diagnosis not present

## 2021-06-27 DIAGNOSIS — I10 Essential (primary) hypertension: Secondary | ICD-10-CM | POA: Diagnosis not present

## 2021-06-27 DIAGNOSIS — Y92007 Garden or yard of unspecified non-institutional (private) residence as the place of occurrence of the external cause: Secondary | ICD-10-CM | POA: Diagnosis not present

## 2021-06-27 DIAGNOSIS — Z96652 Presence of left artificial knee joint: Secondary | ICD-10-CM | POA: Diagnosis not present

## 2021-06-27 DIAGNOSIS — Z471 Aftercare following joint replacement surgery: Secondary | ICD-10-CM | POA: Diagnosis not present

## 2021-06-27 DIAGNOSIS — S199XXA Unspecified injury of neck, initial encounter: Secondary | ICD-10-CM | POA: Diagnosis not present

## 2021-06-27 DIAGNOSIS — S0292XA Unspecified fracture of facial bones, initial encounter for closed fracture: Secondary | ICD-10-CM | POA: Insufficient documentation

## 2021-06-27 DIAGNOSIS — R221 Localized swelling, mass and lump, neck: Secondary | ICD-10-CM | POA: Diagnosis not present

## 2021-06-27 MED ORDER — OXYCODONE-ACETAMINOPHEN 5-325 MG PO TABS: 1.0000 | ORAL_TABLET | Freq: Four times a day (QID) | ORAL | 0 refills | Status: DC | PRN

## 2021-06-27 MED ORDER — AMOXICILLIN-POT CLAVULANATE 875-125 MG PO TABS
1.0000 | ORAL_TABLET | Freq: Once | ORAL | Status: AC
  Administered 2021-06-27: 1 via ORAL
  Filled 2021-06-27: qty 1

## 2021-06-27 MED ORDER — AMOXICILLIN-POT CLAVULANATE 875-125 MG PO TABS: 1.0000 | ORAL_TABLET | Freq: Two times a day (BID) | ORAL | 0 refills | Status: DC

## 2021-06-27 MED ORDER — BACITRACIN ZINC 500 UNIT/GM EX OINT
TOPICAL_OINTMENT | Freq: Two times a day (BID) | CUTANEOUS | Status: DC
  Filled 2021-06-27: qty 3.6

## 2021-06-27 MED ORDER — DEXAMETHASONE SODIUM PHOSPHATE 10 MG/ML IJ SOLN
10.0000 mg | Freq: Once | INTRAMUSCULAR | Status: AC
  Administered 2021-06-27: 10 mg via INTRAMUSCULAR
  Filled 2021-06-27: qty 1

## 2021-06-27 MED ORDER — OXYCODONE-ACETAMINOPHEN 5-325 MG PO TABS
1.0000 | ORAL_TABLET | Freq: Once | ORAL | Status: AC
  Administered 2021-06-27: 1 via ORAL
  Filled 2021-06-27: qty 1

## 2021-06-27 NOTE — ED Provider Triage Note (Signed)
Emergency Medicine Provider Triage Evaluation Note  [name] [name] , a 81 y.o. male  was evaluated in triage.  Pt complains of fall.  Patient was working in the garden he got stung on the left hand by a wasp, fell forward hitting his head.  Did not blackout, has pain to the left side of his face and his left knee.  States he had a knee replacement 3 months ago.  No loss of consciousness, nausea, vomiting.  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  Review of Systems  Per hpi  Physical Exam  BP (!) 163/109 (BP Location: Right Arm)   Pulse (!) 59   Temp 98 F (36.7 C)   Resp 16   SpO2 100%  Gen:   Awake, no distress   Resp:  Normal effort  MSK:   Moves extremities without difficulty  Other:  Left knee swelling, abrasion over left knee.  Left hand is swollen and slightly erythematous but not warm.  No pain with ROM.  Patient has laceration over left eyelid, left eyebrow.  Multiple skin tears in the left upper extremity and left side of face.  Cranial nerves III through XII are grossly intact.  Medical Decision Making  Medically screening exam initiated at 12:49 PM.  Appropriate orders placed.  CAZ WEAVER was informed that the remainder of the evaluation will be completed by another provider, this initial triage assessment does not replace that evaluation, and the importance of remaining in the ED until their evaluation is complete.  Imaging    Sherrill Raring, [place] 06/27/21 1249

## 2021-06-27 NOTE — ED Provider Notes (Signed)
[facility] EMERGENCY DEPARTMENT Provider Note   CSN: [id number] Arrival date & time: 06/27/21  1238     History  Chief Complaint  Patient presents with   [name] [name] is a 81 y.o. male.  HPI Patient was working in the garden and got stung by wasp.  This caused him to try to evade the wasp and he ended up falling directly onto his face.  Patient reports he also fell onto his left knee and scraped his left forearm.  Patient reports he had a lot of bleeding from his nose and above his lip.  There is a lot of swelling and pain in the midface.  Patient denies he had loss of consciousness.  He denies generalized headache.  He denies neck pain.  He denies experienced any numbness or tingling in his arms or his legs.  He has been back up and ambulatory since the fall.     Home Medications Prior to Admission medications   Medication Sig Start Date End Date Taking? Authorizing Provider  amoxicillin-clavulanate (AUGMENTIN) 875-125 MG tablet Take 1 tablet by mouth every 12 (twelve) hours. 06/27/21  Yes Charlesetta Shanks, MD  oxyCODONE-acetaminophen (PERCOCET) 5-325 MG tablet Take 1-2 tablets by mouth every 6 (six) hours as needed. 06/27/21  Yes Charlesetta Shanks, MD  acetaminophen (TYLENOL) 500 MG tablet Take 1,000 mg by mouth every 6 (six) hours as needed for headache (pain).     [provider]  acyclovir (ZOVIRAX) 800 MG tablet Take 400 mg by mouth in the morning and at bedtime. 01/26/19   [provider]  atorvastatin (LIPITOR) 40 MG tablet TAKE 1 TABLET AT BEDTIME 09/27/20   [name], MD  carvedilol (COREG) 6.25 MG tablet TAKE 1 TABLET TWICE DAILY 09/27/20   [name], MD  esomeprazole (NEXIUM) 20 MG capsule Take 20 mg by mouth every other day. Reported on 03/01/2015    [provider]  lisinopril (ZESTRIL) 20 MG tablet TAKE 1 TABLET TWICE DAILY 05/14/21   [name], MD  methimazole (TAPAZOLE) 5 MG tablet Take 0.5 tablets (2.5  mg total) by mouth daily. 10/23/20   Shamleffer, Melanie Crazier, MD  methocarbamol (ROBAXIN) 500 MG tablet Take 1 tablet (500 mg total) by mouth every 6 (six) hours as needed for muscle spasms. 03/13/21   Edmisten, Kristie L, PA  oxybutynin (DITROPAN) 5 MG tablet Take 5 mg by mouth 2 (two) times daily.  10/22/16   [provider]  oxyCODONE (OXY IR/ROXICODONE) 5 MG immediate release tablet Take 1-2 tablets (5-10 mg total) by mouth every 6 (six) hours as needed for severe pain. 03/13/21   Edmisten, Kristie L, PA  prednisoLONE acetate (PRED FORTE) 1 % ophthalmic suspension Place 1 drop into the left eye in the morning and at bedtime.    [provider]  spironolactone (ALDACTONE) 25 MG tablet Take 12.5 mg by mouth daily. Take 1/2 tablet daily    [provider]  timolol (TIMOPTIC) 0.5 % ophthalmic solution Place 1 drop into the left eye 2 (two) times daily. 09/30/16   [provider]  traMADol (ULTRAM) 50 MG tablet Take 1-2 tablets (50-100 mg total) by mouth every 6 (six) hours as needed for moderate pain. 03/13/21   Edmisten, Ok Anis, PA      Allergies    Hydrochlorothiazide    Review of Systems   Review of Systems 10 systems reviewed negative except as per HPI Physical Exam Updated Vital Signs BP 136/77 (  BP Location: Right Arm)   Pulse (!) 59   Temp 98 F (36.7 C) (Oral)   Resp 18   SpO2 98%  Physical Exam Constitutional:      Comments: Alert.  GCS 15.  No respiratory distress.   HENT:     Head:     Comments: Multiple minor abrasions and cuts to the face and large swelling over the right cheek.  Extensive bruising and hematoma on the chin.  See attached images.  No trismus and jaws aligned.  Patient can open and close the jaw.    Nose:     Comments: Blood is dried in both naris.  No active bleeding.    Mouth/Throat:     Comments: Posterior airway is widely patent.  There is some blood streaking mucus in the posterior nasopharynx.  Patient has deep  purple hematoma visible on the buccal mucosa of the right cheek. Eyes:     Comments: Patient has a left prosthetic eye.  There are some abrasions of the lid..  General periorbital hematoma but the patient can open the eye.  Neck:     Comments: No midline C-spine tenderness. Cardiovascular:     Rate and Rhythm: Normal rate and regular rhythm.  Pulmonary:     Effort: Pulmonary effort is normal.     Breath sounds: Normal breath sounds.  Chest:     Chest wall: No tenderness.  Abdominal:     General: There is no distension.     Palpations: Abdomen is soft.     Tenderness: There is no abdominal tenderness. There is no guarding.  Musculoskeletal:        General: Normal range of motion.     Comments: Patient does have superficial abrasions to the left forearm.  Hematoma to left knee but intact range of motion and weightbearing   Skin:    General: Skin is warm and dry.  Neurological:     General: No focal deficit present.     Mental Status: He is oriented to person, place, and time.     Motor: No weakness.     Coordination: Coordination normal.     Comments: Patient is independently ambulatory with a coordinated gait.  Psychiatric:        Mood and Affect: Mood normal.       ED Results / Procedures / Treatments   Labs (all labs ordered are listed, but only abnormal results are displayed) Labs Reviewed - No data to display  EKG None  Radiology CT Head Wo Contrast  Result Date: 06/27/2021 CLINICAL DATA:  Head trauma, minor (Age >= 65y) EXAM: CT HEAD WITHOUT CONTRAST TECHNIQUE: Contiguous axial images were obtained from the base of the skull through the vertex without intravenous contrast. RADIATION DOSE REDUCTION: This exam was performed according to the departmental dose-optimization program which includes automated exposure control, adjustment of the mA and/or kV according to patient size and/or use of iterative reconstruction technique. COMPARISON:  None Available. FINDINGS:  Brain: No evidence of acute infarction, hemorrhage, hydrocephalus, extra-axial collection or mass lesion/mass effect. Moderate age appropriate diffuse cerebral atrophy with prominent sulci and ventricles. Moderate chronic periventricular white matter ischemia. Vascular: No hyperdense vessel. Moderately severe atheromatous calcifications of the distal vertebral arteries and cavernous aspect of the carotid arteries. Skull: There is an acute fracture of the right anterior maxillary sinus with up to 6 mm posterior depression. Nondisplaced pterygoid plate fractures are suspected. There is an acute mildly displaced and mildly comminuted fracture of the nasal  bones with 3-4 mm lateral displacement of the left nasal bone. Sinuses/Orbits: Moderate bilateral maxillary sinus opacification including bilateral air-fluid levels. There is hyperdensity seen at the right maxillary sinus of likely hemorrhage. Moderate mucosal opacification of the ethmoidal sinuses more so anteriorly. Other: There are pockets of air seen tracking within the soft tissues lateral to the right maxillary sinus and lateral to the frontal skull. Soft tissue swelling with small hematoma at the forehead IMPRESSION: 1. No intracranial bleed, mass, mass effect or acute major vessel infarction seen. Age-appropriate moderate diffuse cerebral atrophy with moderate periventricular white matter ischemia. 2. Comminuted about 6 mm depressed fracture of the anterior wall of the right maxillary sinus. 3. Comminuted fractures of the bilateral nasal bones with 3-4 mm lateral displacement of the left nasal bone. 4.  Likely nondisplaced fractures of the bilateral pterygoid plates. 5. Severe right and moderate left opacification of the maxillary sinuses with air fluid levels. Mucosal thickening of the bilateral ethmoidal sinuses more so anteriorly. 6.  Mild soft tissue swelling/hematoma at the forehead region. Electronically Signed   By: Frazier Richards M.D.   On: 06/27/2021  14:31   CT Cervical Spine Wo Contrast  Result Date: 06/27/2021 CLINICAL DATA:  Neck trauma (Age >= 65y) after a fall with pain and left sided facial swelling and bruising EXAM: CT CERVICAL SPINE WITHOUT CONTRAST TECHNIQUE: Multidetector CT imaging of the cervical spine was performed without intravenous contrast. Multiplanar CT image reconstructions were also generated. RADIATION DOSE REDUCTION: This exam was performed according to the departmental dose-optimization program which includes automated exposure control, adjustment of the mA and/or kV according to patient size and/or use of iterative reconstruction technique. COMPARISON:  September 30, 2013 FINDINGS: Alignment: There is 3 mm subluxation of C4 over C5 seen and has increased in the interim. Skull base and vertebrae: No acute fracture. No primary bone lesion or focal pathologic process. Moderately severe degenerative changes with prominent marginal osteophytes and facet hypertrophic changes. Soft tissues and spinal canal: No prevertebral fluid or swelling. No visible canal hematoma. Disc levels: Loss of the disc space at C6-C7 anterior lesser extent C5-C6. Upper chest: There are couple of nodules in the left upper lobe measuring 4 and 4.6 mm (images 84 and 85 of series 4) Other: None. IMPRESSION: 3 mm anterior subluxation of C4 over C5 and has increased in the interim. No fracture or dislocation. Moderately severe degenerative changes with prominent marginal osteophytes and facet hypertrophic changes. Loss of the disc space at C6-C7. Couple of 4 and 4.6 mm pulmonary nodule seen in the left upper lobe. Recommend noncontrast CT of the chest for further evaluation of the remainder of the lungs. Electronically Signed   By: Frazier Richards M.D.   On: 06/27/2021 13:49   DG Knee Complete 4 Views Left  Result Date: 06/27/2021 CLINICAL DATA:  Fall, history of knee replacement EXAM: LEFT KNEE - COMPLETE 4+ VIEW COMPARISON:  None Available. FINDINGS: Post left  total knee arthroplasty. No evidence of periprosthetic or other acute fracture. Vascular calcifications are noted. IMPRESSION: No acute fracture. Electronically Signed   By: Macy Mis M.D.   On: 06/27/2021 13:11   CT Maxillofacial Wo Contrast  Result Date: 06/27/2021 CLINICAL DATA:  Fall. Facial trauma. Left-sided facial swelling/bruising. EXAM: CT MAXILLOFACIAL WITHOUT CONTRAST TECHNIQUE: Multidetector CT imaging of the maxillofacial structures was performed. Multiplanar CT image reconstructions were also generated. RADIATION DOSE REDUCTION: This exam was performed according to the departmental dose-optimization program which includes automated exposure control, adjustment of the  mA and/or kV according to patient size and/or use of iterative reconstruction technique. COMPARISON:  None Available. FINDINGS: Osseous: There is an acute mildly displaced and mildly comminuted fracture of the left-greater-than-right nasal bone with approximately 3-4 mm medial displacement of the left nasal bone. There is an acute fracture of the anterior right maxillary sinus wall with up to 6 mm posterior depression. There appeared to be nondisplaced fractures of the bilateral pterygoid plates (axial series 4 images 56 and 57 and coronal series 10 images 47 through 51). The bilateral temporomandibular joints are located. The bilateral zygomatic arches are intact. Air is seen tracking within the soft tissues lateral to the right maxillary sinus and lateral to the frontal skull. Orbits: Redemonstration of a superolateral left ocular prosthesis. The bilateral orbital globes are normal in size. Status post bilateral ocular lens replacements. The bilateral extraocular muscles are intact without entrapment. Sinuses: Moderate mucosal opacification of the left-greater-than-right ethmoid air cells. Moderate bilateral maxillary sinus opacification including bilateral air-fluid levels and additional nondependent right thicker appearing  mucosal secretions with internal bubbly air lucencies. The sphenoid sinus is clear. The bilateral mastoid air cells are clear. Soft tissues: Moderate midline to left forehead soft tissue hematoma. Mild-to-moderate superolateral left periorbital soft tissue swelling. Limited intracranial: Please see contemporaneous MRI of the brain for evaluation of the brain parenchyma. IMPRESSION: 1. Mildly displaced and comminuted acute nasal bone fracture. 2. Anterior right maxillary sinus wall acute fracture with up to 6 mm posterior depression. 3. Nondisplaced bilateral pterygoid plate acute fractures. Electronically Signed   By: Yvonne Kendall M.D.   On: 06/27/2021 13:57    Procedures Procedures    Medications Ordered in ED Medications  bacitracin ointment (has no administration in time range)  oxyCODONE-acetaminophen (PERCOCET/ROXICET) 5-325 MG per tablet 1 tablet (1 tablet Oral Given 06/27/21 1701)  amoxicillin-clavulanate (AUGMENTIN) 875-125 MG per tablet 1 tablet (1 tablet Oral Given 06/27/21 1858)  dexamethasone (DECADRON) injection 10 mg (10 mg Intramuscular Given 06/27/21 1859)    ED Course/ Medical Decision Making/ A&P                           Medical Decision Making Risk OTC drugs. Prescription drug management.   Consult: 17: 35 reviewed with Kim neurosurgery no further imaging needed at this time. Can follow-up outpatient  Consult: 18: 28 reviewed with Dr. Mancel Parsons maxillofacial trauma.  Commence Augmentin and Decadron.  . Patient presents as outlined with fall.  He has multiple facial fractures present.  His mental status is clear.  No signs of confusion.  CT does not show any intracranial injury.  We will initiate Augmentin and Decadron as per discussion with Dr. Mancel Parsons for maxillofacial trauma.  Patient has no midline CT tenderness and did not experience any neurologic symptoms.  Careful return precautions reviewed.  No signs at this time of critical neck injury.  Stable for outpatient  follow-up.  Percocet prescribed for pain control.  Patient is counseled on no additional use of NSAIDs as he got a dose of Decadron in the emergency department.  We discussed precautions of head elevation and no blowing of the nose.  We discussed return precautions.        Final Clinical Impression(s) / ED Diagnoses Final diagnoses:  Fall, initial encounter  Closed fracture of facial bone due to fall, initial encounter ([place])  Contusion of left knee, initial encounter    Rx / DC Orders ED Discharge Orders  Ordered    oxyCODONE-acetaminophen (PERCOCET) 5-325 MG tablet  Every 6 hours PRN        06/27/21 1918    amoxicillin-clavulanate (AUGMENTIN) 875-125 MG tablet  Every 12 hours        06/27/21 1918              Charlesetta Shanks, MD 06/27/21 1933

## 2021-06-27 NOTE — Discharge Instructions (Signed)
1.  Do not blow your nose.  If you have to sneeze, sneeze with your mouth open.  You may dab any blood or drainage from your nose,  but do not stick anything into it.  Sit and sleep with your head elevated at 30 degrees.  2.  Take Augmentin twice daily as prescribed.  This is an antibiotic to help prevent infection.  Keep your facial wounds clean and put bacitracin ointment onto them twice daily.  Also put bacitracin ointment on your scrapes to your knee and arm.  Ice any bruised swollen areas. 3.  You are given a steroid to help with swelling in the face.  Do not take any additional ibuprofen, Aleve or other medications known as NSAIDs.  You may take 1-2 Percocet tablets if needed for pain.  If your pain is more mild you may take plain Tylenol tablets. 4.  Schedule your appointment with Dr. Mancel Parsons, contact information is included in your discharge instructions 5.  Return to emergency department immediately for new worsening or concerning symptoms.

## 2021-06-27 NOTE — ED Notes (Signed)
Pt and wife verbalized understanding of d/c instructions, prescriptions and follow up care. Pt wheeled out of ED via wheelchair. Pts wife is driving him home.

## 2021-06-27 NOTE — ED Triage Notes (Signed)
Pt working out in the yard, was stung by some bees, tried to run from them and tripped on the gravel, falling. No LOC, no blood thinners. Facial bruising, L forearm pain.

## 2021-06-27 NOTE — ED Notes (Signed)
Wounds cleaned with saline, dressings placed on knee and arm with nonadherent pads and bacitracin.

## 2021-07-04 ENCOUNTER — Other Ambulatory Visit: Payer: Self-pay | Admitting: Cardiovascular Disease

## 2021-07-05 NOTE — Telephone Encounter (Signed)
Rx request sent to pharmacy.  

## 2021-07-18 DIAGNOSIS — N182 Chronic kidney disease, stage 2 (mild): Secondary | ICD-10-CM | POA: Diagnosis not present

## 2021-07-18 DIAGNOSIS — Z0000 Encounter for general adult medical examination without abnormal findings: Secondary | ICD-10-CM | POA: Diagnosis not present

## 2021-07-18 DIAGNOSIS — I2510 Atherosclerotic heart disease of native coronary artery without angina pectoris: Secondary | ICD-10-CM | POA: Diagnosis not present

## 2021-07-18 DIAGNOSIS — E7800 Pure hypercholesterolemia, unspecified: Secondary | ICD-10-CM | POA: Diagnosis not present

## 2021-07-18 DIAGNOSIS — E0500 Thyrotoxicosis with diffuse goiter without thyrotoxic crisis or storm: Secondary | ICD-10-CM | POA: Diagnosis not present

## 2021-07-18 DIAGNOSIS — K219 Gastro-esophageal reflux disease without esophagitis: Secondary | ICD-10-CM | POA: Diagnosis not present

## 2021-07-18 DIAGNOSIS — N529 Male erectile dysfunction, unspecified: Secondary | ICD-10-CM | POA: Diagnosis not present

## 2021-07-18 DIAGNOSIS — I517 Cardiomegaly: Secondary | ICD-10-CM | POA: Diagnosis not present

## 2021-07-18 DIAGNOSIS — I1310 Hypertensive heart and chronic kidney disease without heart failure, with stage 1 through stage 4 chronic kidney disease, or unspecified chronic kidney disease: Secondary | ICD-10-CM | POA: Diagnosis not present

## 2021-08-12 ENCOUNTER — Other Ambulatory Visit: Payer: Self-pay | Admitting: Internal Medicine

## 2021-08-21 DIAGNOSIS — R69 Illness, unspecified: Secondary | ICD-10-CM | POA: Diagnosis not present

## 2021-09-09 ENCOUNTER — Other Ambulatory Visit: Payer: Self-pay | Admitting: Cardiovascular Disease

## 2021-09-10 NOTE — Telephone Encounter (Signed)
Rx request sent to pharmacy.  

## 2021-09-17 DIAGNOSIS — H401123 Primary open-angle glaucoma, left eye, severe stage: Secondary | ICD-10-CM | POA: Diagnosis not present

## 2021-09-17 DIAGNOSIS — H04122 Dry eye syndrome of left lacrimal gland: Secondary | ICD-10-CM | POA: Diagnosis not present

## 2021-09-17 DIAGNOSIS — Z947 Corneal transplant status: Secondary | ICD-10-CM | POA: Diagnosis not present

## 2021-09-17 DIAGNOSIS — Z961 Presence of intraocular lens: Secondary | ICD-10-CM | POA: Diagnosis not present

## 2021-09-17 NOTE — Progress Notes (Unsigned)
Cardiology Office Note:    Date:  09/18/2021   ID:  [name] [name], [name] [name] 1941/01/07, MRN [id number]  PCP:  [name] Neer, MD   [facility] Cardiologist:  [name] Latch, MD     Referring MD: [name] Neer, MD   CC: Recent Syncope and Collapse  History of Present Illness:    [name] [name] is a 81 y.o. male with a hx of the following:  HTN CAD, s/p CABG X 4 2016 Aortic stenosis S/P aortic valve replacement with bioprosthetic valve 2016 Pure hypercholesterolemia Varicose veins of left lower extremity GERD Hx of syncope Bradycardia Fatigue   He underwent four-vessel CABG and aortic valve replacement on September 2016, postoperative course was complicated by atrial flutter/atrial fibrillation and was discharged in normal sinus rhythm.  In August 2017 he had an episode of lightheadedness and presented to the ED, it was found that he was having acute renal failure and felt to be dehydrated due to doing yard work for several hours the day before and not staying well-hydrated.  For his bradycardia and hypertension metoprolol was switched to carvedilol, was started on amlodipine and simvastatin was switched Lipitor.  In 2020 he had lower extremity Dopplers done for some right lower extremity edema that were negative for DVT.  Unable to tolerate amlodipine due to lower extremity swelling so was started on spironolactone.  In 2022 he had an episode of syncope where his blood pressure dropped to 86/45.  Like before, he had been working outside and had not eaten or drinking much.  He went to the bathroom and had to sit down.  As he sat down on his bed and stood back up he passed out.  It was thought that his symptoms were orthostatic as he has had these episodes before, amlodipine was discontinued.  Previous cardiac work-up included echocardiogram in April 2022 that revealed LVEF of 55%, aortic valve replacement functioning well, with grade 1 diastolic dysfunction.  Nuclear  stress test revealed LVEF 63% with no ischemia.  At his last office visit with [name] [name] in November 2022 he reported feeling well-denied any cardiac complaints, blood pressure log mostly well-controlled.  Denied any problems with lightheadedness.  Was told to follow-up in 6 months. Today he presents for follow-up and he states overall he is doing well, however had a syncope and collapse episode in July.  He was outside doing lawn work when some hornets stung him on the right earlobe  and his hand.  He made a quick movement, turned around, then passed out and fell to the ground.  States it happened so quick, no warning sign prior to, denied any vision loss.  Face was bruised and nose was hurt.  He presented to the ED and had a thorough work-up done.  Denies vertigo.  Does admit to some lightheadedness occasionally while bending over.  No dizziness or presyncope/syncopal episodes since.  Denies any chest pain, shortness of breath, swelling or significant weight changes, fever, chills, nausea/ vomiting, neurological deficits, orthopnea/PND, any acute bleeding, claudication.   Past Medical History:  Diagnosis Date   A-fib [facility])    Arthritis    knee   Cataracts, bilateral    Chronic kidney disease    stage 2   Coronary artery disease    Fatigue 04/05/2020   GERD (gastroesophageal reflux disease)    Glaucoma    Hearing loss    wears hearing aids   History of kidney stones    passed stones and 1  time had surgery   Hyperlipidemia    Hypertension    Inguinal hernia 03/2019   Prostate cancer ([place])    Pure hypercholesterolemia 03/23/2019   S/P aortic valve replacement with bioprosthetic valve 03/23/2019   Bioprosthetic 09/2014   Shingles    Varicose veins 03/31/2015   Wears partial dentures    upper    Past Surgical History:  Procedure Laterality Date   AORTIC VALVE REPLACEMENT N/A 09/28/2014   Procedure: AORTIC VALVE REPLACEMENT (AVR);  Surgeon: [name], MD;  Location: [place];   Service: Open Heart Surgery;  Laterality: N/A;   CARDIAC CATHETERIZATION N/A 09/23/2014   Procedure: Left Heart Cath and Coronary Angiography;  Surgeon: [name], MD;  Location: [place] [facility];  Service: Cardiovascular;  Laterality: N/A;   CATARACT EXTRACTION     CHOLECYSTECTOMY N/A 10/28/2016   Procedure: LAPAROSCOPIC CHOLECYSTECTOMY;  Surgeon: Ralene Ok, MD;  Location: [place];  Service: General;  Laterality: N/A;   COLONOSCOPY     CORONARY ARTERY BYPASS GRAFT N/A 09/28/2014   Procedure: CORONARY ARTERY BYPASS GRAFTING (CABG) x4 using left internal mammory artery and right saphenous leg vein harvested endoscopically.;  Surgeon: [name], MD;  Location: [place];  Service: Open Heart Surgery;  Laterality: N/A;   INGUINAL HERNIA REPAIR Right 04/12/2019   Procedure: LAPAROSCOPIC RIGHT INGUINAL HERNIA REPAIR WITH MESH;  Surgeon: Ralene Ok, MD;  Location: [place];  Service: General;  Laterality: Right;   kidney stone removal     PROSTATECTOMY     TEE WITHOUT CARDIOVERSION N/A 09/28/2014   Procedure: TRANSESOPHAGEAL ECHOCARDIOGRAM (TEE);  Surgeon: [name], MD;  Location: [place];  Service: Open Heart Surgery;  Laterality: N/A;   TONSILLECTOMY     TOTAL KNEE ARTHROPLASTY Left 03/12/2021   Procedure: TOTAL KNEE ARTHROPLASTY;  Surgeon: Gaynelle Arabian, MD;  Location: [facility];  Service: Orthopedics;  Laterality: Left;   WISDOM TOOTH EXTRACTION      Current Medications: Current Meds  Medication Sig   acetaminophen (TYLENOL) 500 MG tablet Take 1,000 mg by mouth every 6 (six) hours as needed for headache (pain).    acyclovir (ZOVIRAX) 800 MG tablet Take 400 mg by mouth in the morning and at bedtime.   aspirin EC 81 MG tablet Take 81 mg by mouth daily. Swallow whole.   atorvastatin (LIPITOR) 40 MG tablet TAKE 1 TABLET AT BEDTIME   carvedilol (COREG) 6.25 MG tablet Take 1 tablet (6.25 mg total) by mouth 2 (two) times daily.   esomeprazole (NEXIUM) 20 MG capsule Take 20  mg by mouth every other day. Reported on 03/01/2015   lisinopril (ZESTRIL) 20 MG tablet TAKE 1 TABLET TWICE DAILY   methimazole (TAPAZOLE) 5 MG tablet TAKE 1/2 TABLET BY MOUTH DAILY   oxybutynin (DITROPAN) 5 MG tablet Take 5 mg by mouth 2 (two) times daily.    prednisoLONE acetate (PRED FORTE) 1 % ophthalmic suspension Place 1 drop into the left eye in the morning and at bedtime.   spironolactone (ALDACTONE) 25 MG tablet Take 1 tablet (25 mg total) by mouth daily. Please schedule appointment for refills.   timolol (TIMOPTIC) 0.5 % ophthalmic solution Place 1 drop into the left eye 2 (two) times daily.     Allergies:   Hydrochlorothiazide   Social History   Socioeconomic History   Marital status: Married    Spouse name: Not on file   Number of children: Not on file   Years of education: Not on file  Highest education level: Not on file  Occupational History   Not on file  Tobacco Use   Smoking status: Former    Types: Cigarettes    Quit date: 05/22/1980    Years since quitting: 41.3   Smokeless tobacco: Never   Tobacco comments:    quit smoking in 1982  Vaping Use   Vaping Use: Never used  Substance and Sexual Activity   Alcohol use: Yes    Alcohol/week: 0.0 standard drinks of alcohol    Comment: occasional liquor/ Beer   Drug use: No   Sexual activity: Yes  Other Topics Concern   Not on file  Social History Narrative   Pt leaving with wife.   Social Determinants of Health   Financial Resource Strain: Not on file  Food Insecurity: Not on file  Transportation Needs: Not on file  Physical Activity: Not on file  Stress: Not on file  Social Connections: Not on file     Family History: The patient's family history includes Cancer in his mother; Ovarian cancer in his mother.  ROS:   Review of Systems  Constitutional:  Negative for chills, diaphoresis, fever, malaise/fatigue and weight loss.  HENT: Negative.    Eyes: Negative.        Chronic vision loss in the left  eye, no vision loss in right eye.   Respiratory: Negative.    Cardiovascular: Negative.        See HPI.  Gastrointestinal: Negative.   Genitourinary: Negative.   Musculoskeletal:  Positive for falls. Negative for back pain, joint pain, myalgias and neck pain.       See HPI.  Syncope and collapse in July 2023.  Skin: Negative.        See HPI.  Neurological:  Positive for dizziness. Negative for tingling, tremors, sensory change, speech change, focal weakness, seizures, loss of consciousness, weakness and headaches.  Endo/Heme/Allergies: Negative.   Psychiatric/Behavioral: Negative.      Please see the history of present illness.    All other systems reviewed and are negative.  EKGs/Labs/Other Studies Reviewed:    The following studies were reviewed today:   EKG:  EKG is ordered today.  The ekg ordered today demonstrates sinus bradycardia, 54 bpm, left anterior fascicular block, minimal LVH, nonspecific, ST segment changes, otherwise no acute changes, similar to prior EKG from 11/2020.    Nuclear medicine stress test on May 03, 2020: LVEF is normal (55 to 65%).  Nuclear stress EF: 63%.  There was no ST segment deviation noted during stress.  This low risk study.  There is a small, moderate, reversible defect in the inferoapex.  Wall motion is normal in this region.  There is significant extracardiac bowel tracer uptake adjacent to the inferoapex with stress on both supine and upright stress images.  This finding likely represents attenuation artifact from extracardiac tracer uptake, but cannot exclude a small area of ischemia.  2D echocardiogram on May 03, 2020: 1. Left ventricular ejection fraction, by estimation, is 55%. The left  ventricle has normal function. The left ventricle has no regional wall  motion abnormalities. There is mild left ventricular hypertrophy. Left  ventricular diastolic parameters are  consistent with Grade I diastolic dysfunction (impaired relaxation).    2. Right ventricular systolic function is normal. The right ventricular  size is normal. There is normal pulmonary artery systolic pressure. The  estimated right ventricular systolic pressure is 14.4 mmHg.   3. Left atrial size was mild to moderately dilated.  4. Right atrial size was mildly dilated.   5. The mitral valve is normal in structure. Mild mitral valve  regurgitation. No evidence of mitral stenosis.   6. Tricuspid valve regurgitation is mild to moderate.   7. Bioprosthetic aortic valve replacement. Mean gradient 18 mmHg. No  significant regurgitation.   8. The inferior vena cava is normal in size with greater than 50%  respiratory variability, suggesting right atrial pressure of 3 mmHg.   Vascular ultrasound lower extremity venous Doppler (DVT) on March 25, 2018: Right: No evidence of deep vein thrombosis in the lower extremity. No  indirect evidence of obstruction proximal to the inguinal ligament.  Left: No evidence of common femoral vein obstruction.   Left heart catheterization and coronary angiography on September 23, 2014: Severe multivessel disease: Prox LAD to Mid LAD lesion, 95% stenosed. Ramus lesion, 95% stenosed. Ost 1st Diag lesion, 90% stenosed. Dist RCA lesion, 90% stenosed. 1st Mrg lesion, 60% stenosed. Dist LAD lesion, 40% stenosed. There is mild left ventricular systolic dysfunction. There does appear to be a distal LAD distribution wall motion abnormality     Severe multivessel disease involving the ostial-proximal LAD and what appears to be either a bifurcating diagonal or ramus and diagonal branch. There is also focal distal RCA disease.   Based on the location of the LAD lesion, there is not adequate landing zone for a stent prior to the Left Main. Additionally, with involvement of 2 major branches this would be a very high-risk PCI. Overall best recommendation would be to pursue CABG.   The patient also has a significant aortic valve murmur with gradient  noted by catheterization. Recommend echocardiogram to better assess left ventricular outflow/aortic valve stenosis. Pending the severity of aortic valve disease, would consider potentially Performing Aortic Valve Replacement in Addition to CABG.     Plan: The patient will be admitted to the TCU STEPDOWN Unit for close monitoring. Cardiac surgery has been consulted by Dr. Sallyanne Kuster Will initiate IV heparin 6 hours following TR band removal Aggressive risk factor modification. Blood pressure were tolerated, would consider IV nitroglycerin.   Recent Labs: 10/20/2020: TSH 3.41 02/27/2021: ALT 24 03/13/2021: BUN 24; Creatinine, Ser 0.97; Hemoglobin 11.6; Platelets 151; Potassium 4.7; Sodium 137  Recent Lipid Panel No results found for: "CHOL", "TRIG", "HDL", "CHOLHDL", "VLDL", "LDLCALC", "LDLDIRECT"    Physical Exam:    VS:  BP (!) 140/95 (BP Location: Left Arm, Patient Position: Sitting, Cuff Size: Normal)   Pulse (!) 54   Ht '6\' 1"'$  (1.854 m)   Wt 173 lb 3.2 oz (78.6 kg)   BMI 22.85 kg/m     Wt Readings from Last 3 Encounters:  09/18/21 173 lb 3.2 oz (78.6 kg)  03/12/21 165 lb 3.2 oz (74.9 kg)  02/27/21 165 lb 3.2 oz (74.9 kg)    Vitals:   09/18/21 1124 09/18/21 1203  BP: (!) 159/89 (!) 140/95  Pulse:     Orthostatic VS for the past 24 hrs (Last 3 readings):  BP- Lying Pulse- Lying BP- Sitting Pulse- Sitting BP- Standing at 0 minutes Pulse- Standing at 0 minutes BP- Standing at 3 minutes Pulse- Standing at 3 minutes  09/18/21 1208 163/88 52 (!) 164/93 54 (!) 160/92 56 (!) 160/94 54     GEN: Well nourished, well developed in no acute distress HEENT: Normal NECK: No JVD; No carotid bruits CARDIAC: Slow rate and regular RRR, no murmurs, rubs, gallops; 2+ peripheral pulses throughout, strong and equal bilaterally, varicosities/varicose veins noted along left  lower leg (chronic per patient's report) RESPIRATORY:  Clear to auscultation without rales, wheezing or rhonchi  ABDOMEN:  Soft, non-tender, non-distended MUSCULOSKELETAL:  No edema; No deformity  SKIN: Warm and dry NEUROLOGIC:  Alert and oriented x 3 PSYCHIATRIC:  Normal affect   ASSESSMENT:    1. Coronary artery disease of native artery of native heart with stable angina pectoris ([place])   2. Syncope, unspecified syncope type   3. S/P CABG x 4   4. Pure hypercholesterolemia   5. Aortic valve stenosis, etiology of cardiac valve disease unspecified   6. S/P aortic valve replacement with bioprosthetic valve   7. Hypertension, unspecified type    PLAN:    In order of problems listed above:  Syncope and collapse - recent Etiology multifactorial.  Orthostatic blood pressures obtained in office today were negative as noted above.  Did admit to some orthostatic dizziness, while bending over, will update 2D echocardiogram and carotid Doppler.  We will obtain the following blood work CBC, BMET, magnesium, and TSH.   2.  Coronary artery disease, status post CABG x4, pure hypercholesterolemia with LDL goal less than 70 Stable with no anginal symptoms. No indication for ischemic evaluation. LDL from 12/2020 was at goal at 56. Continue Lipitor 80 mg daily and ASA 81 mg daily.   3. Aortic valve stenosis, s/p aortic valve replacement with bioprosthetic valve-chronic, stable AVR with bioprosthetic valve in 2016.  Echo in 2022 revealed LVEF 55%, no significant regurgitation noted along bioprosthetic aortic valve replacement.  No murmur, rub, or gallop noted on exam. Will update 2D complete echocardiogram as mentioned above.  Mentioned to him about SBE prophylaxis, including amoxicillin to take as needed for dental procedures, stated to check in with his PCP or dentist about this prescription.  He verbalized understanding.  4.  Hypertension-chronic, stable Initial BP on arrival 164/90 and 159/89, repeat manual BP 140/95.  Does admit to whitecoat hypertension.  Mentions blood pressure occasionally checked at home is 130/70,  heart rate in the 60s. Discussed to monitor BP at home at least 2 hours after medications and sitting for 5-10 minutes. Discussed to let me know his readings within 2 weeks time via MyChart.  If SBP continues to be elevated, plan to increase carvedilol or spironolactone.  5.  Disposition: Follow-up in 1 month for evaluation of syncope and collapse and blood pressure log.  If he has any recurring episodes of syncope, plan to arrange ZIO monitor at next visit.   Medication Adjustments/Labs and Tests Ordered: Current medicines are reviewed at length with the patient today.  Concerns regarding medicines are outlined above.  Orders Placed This Encounter  Procedures   Basic metabolic panel   CBC   Magnesium   Thyroid Panel With TSH   EKG 12-Lead   ECHOCARDIOGRAM COMPLETE   VAS US CAROTID   No orders of the defined types were placed in this encounter.   Patient Instructions  Medication Instructions:  Your Physician recommend you continue on your current medication as directed.    *If you need a refill on your cardiac medications before your next appointment, please call your pharmacy*   Lab Work: Please return for Lab work On Wednesday for BMP, CBC, MAG, Thyroid Panel. You may come to the...   [facility] (3rd floor) [address], [place], [place] [address]  Open: 8am-Noon and 1pm-4:30pm  Please ring the doorbell on the small table when you exit the elevator and the Lab Tech will come get you  [facility] [address] [place], [place], [place] [address] Open: 8am-1pm, then 2pm-4:30pm   [facility]- Please see attached locations sheet stapled to your lab work with address and hours.   If you have labs (blood work) drawn today and your tests are completely normal, you will receive your results only by: [facility] (if you have MyChart) OR A paper copy in the mail If you have any lab test that is abnormal or we need to change  your treatment, we will call you to review the results.   Testing/Procedures: Your physician has requested that you have an echocardiogram. Echocardiography is a painless test that uses sound waves to create images of your heart. It provides your doctor with information about the size and shape of your heart and how well your heart's chambers and valves are working. This procedure takes approximately one hour. There are no restrictions for this procedure. [facility] has requested that you have a carotid duplex. This test is an ultrasound of the carotid arteries in your neck. It looks at blood flow through these arteries that supply the brain with blood. Allow one hour for this exam. There are no restrictions or special instructions.    Follow-Up: At [facility], you and your health needs are our priority.  As part of our continuing mission to provide you with exceptional heart care, we have created designated Provider Care Teams.  These Care Teams include your primary Cardiologist (physician) and Advanced Practice Providers (APPs -  Physician Assistants and Nurse Practitioners) who all work together to provide you with the care you need, when you need it.  We recommend signing up for the patient portal called "MyChart".  Sign up information is provided on this After Visit Summary.  MyChart is used to connect with patients for Virtual Visits (Telemedicine).  Patients are able to view lab/test results, encounter notes, upcoming appointments, etc.  Non-urgent messages can be sent to your provider as well.   To learn more about what you can do with MyChart, go to [email or web address].    Your next appointment:   1 month(s)  The format for your next appointment:   In Person   Provider:   [name], MD or Laurann Montana, NP  Other Instructions Please use the following information and keep a log of your blood pressures for the next two  weeks and send [place] a mychart message on 9/12 with those readings.   Important Information About Sugar     '   Signed, [name], NP  09/18/2021 9:52 PM    [place]

## 2021-09-18 ENCOUNTER — Encounter (HOSPITAL_BASED_OUTPATIENT_CLINIC_OR_DEPARTMENT_OTHER): Payer: Self-pay | Admitting: Nurse Practitioner

## 2021-09-18 ENCOUNTER — Ambulatory Visit (HOSPITAL_BASED_OUTPATIENT_CLINIC_OR_DEPARTMENT_OTHER): Payer: Medicare HMO | Admitting: Nurse Practitioner

## 2021-09-18 VITALS — BP 140/95 | HR 54 | Ht 73.0 in | Wt 173.2 lb

## 2021-09-18 DIAGNOSIS — Z953 Presence of xenogenic heart valve: Secondary | ICD-10-CM | POA: Diagnosis not present

## 2021-09-18 DIAGNOSIS — R55 Syncope and collapse: Secondary | ICD-10-CM | POA: Diagnosis not present

## 2021-09-18 DIAGNOSIS — E7800 Pure hypercholesterolemia, unspecified: Secondary | ICD-10-CM

## 2021-09-18 DIAGNOSIS — I10 Essential (primary) hypertension: Secondary | ICD-10-CM

## 2021-09-18 DIAGNOSIS — I350 Nonrheumatic aortic (valve) stenosis: Secondary | ICD-10-CM

## 2021-09-18 DIAGNOSIS — Z951 Presence of aortocoronary bypass graft: Secondary | ICD-10-CM

## 2021-09-18 DIAGNOSIS — I25118 Atherosclerotic heart disease of native coronary artery with other forms of angina pectoris: Secondary | ICD-10-CM | POA: Diagnosis not present

## 2021-09-18 NOTE — Patient Instructions (Signed)
Medication Instructions:  Your Physician recommend you continue on your current medication as directed.    *If you need a refill on your cardiac medications before your next appointment, please call your pharmacy*   Lab Work: Please return for Lab work On Wednesday for BMP, CBC, MAG, Thyroid Panel. You may come to the...   [facility] (3rd floor) [address] [place], [place] [address]  Open: 8am-Noon and 1pm-4:30pm  Please ring the doorbell on the small table when you exit the elevator and the Lab Tech will come get you  [place] [facility] [address] [place], [place], [place] [address] Open: 8am-1pm, then 2pm-4:30pm   [place]- Please see attached locations sheet stapled to your lab work with address and hours.   If you have labs (blood work) drawn today and your tests are completely normal, you will receive your results only by: [place] (if you have MyChart) OR A paper copy in the mail If you have any lab test that is abnormal or we need to change your treatment, we will call you to review the results.   Testing/Procedures: Your physician has requested that you have an echocardiogram. Echocardiography is a painless test that uses sound waves to create images of your heart. It provides your doctor with information about the size and shape of your heart and how well your heart's chambers and valves are working. This procedure takes approximately one hour. There are no restrictions for this procedure. [place] has requested that you have a carotid duplex. This test is an ultrasound of the carotid arteries in your neck. It looks at blood flow through these arteries that supply the brain with blood. Allow one hour for this exam. There are no restrictions or special instructions.    Follow-Up: At [facility], you and your health needs are our priority.  As part of our  continuing mission to provide you with exceptional heart care, we have created designated Provider Care Teams.  These Care Teams include your primary Cardiologist (physician) and Advanced Practice Providers (APPs -  Physician Assistants and Nurse Practitioners) who all work together to provide you with the care you need, when you need it.  We recommend signing up for the patient portal called "MyChart".  Sign up information is provided on this After Visit Summary.  MyChart is used to connect with patients for Virtual Visits (Telemedicine).  Patients are able to view lab/test results, encounter notes, upcoming appointments, etc.  Non-urgent messages can be sent to your provider as well.   To learn more about what you can do with MyChart, go to [email or web address].    Your next appointment:   1 month(s)  The format for your next appointment:   In Person   Provider:   [name], MD or Laurann Montana, NP  Other Instructions Please use the following information and keep a log of your blood pressures for the next two weeks and send [place] a mychart message on 9/12 with those readings.   Important Information About Sugar     '

## 2021-09-19 DIAGNOSIS — R55 Syncope and collapse: Secondary | ICD-10-CM | POA: Diagnosis not present

## 2021-09-19 DIAGNOSIS — I10 Essential (primary) hypertension: Secondary | ICD-10-CM | POA: Diagnosis not present

## 2021-09-19 DIAGNOSIS — E7800 Pure hypercholesterolemia, unspecified: Secondary | ICD-10-CM | POA: Diagnosis not present

## 2021-09-19 DIAGNOSIS — I25118 Atherosclerotic heart disease of native coronary artery with other forms of angina pectoris: Secondary | ICD-10-CM | POA: Diagnosis not present

## 2021-09-20 LAB — BASIC METABOLIC PANEL
BUN/Creatinine Ratio: 22 (ref 10–24)
BUN: 27 mg/dL (ref 8–27)
CO2: 22 mmol/L (ref 20–29)
Calcium: 10 mg/dL (ref 8.6–10.2)
Chloride: 104 mmol/L (ref 96–106)
Creatinine, Ser: 1.21 mg/dL (ref 0.76–1.27)
Glucose: 106 mg/dL — ABNORMAL HIGH (ref 70–99)
Potassium: 5.4 mmol/L — ABNORMAL HIGH (ref 3.5–5.2)
Sodium: 141 mmol/L (ref 134–144)
eGFR: 60 mL/min/{1.73_m2} (ref >59)

## 2021-09-20 LAB — CBC
Hematocrit: 41.1 % (ref 37.5–51.0)
Hemoglobin: 13.7 g/dL (ref 13.0–17.7)
MCH: 32.2 pg (ref 26.6–33.0)
MCHC: 33.3 g/dL (ref 31.5–35.7)
MCV: 97 fL (ref 79–97)
Platelets: 190 10*3/uL (ref 150–450)
RBC: 4.26 x10E6/uL (ref 4.14–5.80)
RDW: 12.3 % (ref 11.6–15.4)
WBC: 5.9 10*3/uL (ref 3.4–10.8)

## 2021-09-20 LAB — MAGNESIUM: Magnesium: 2.1 mg/dL (ref 1.6–2.3)

## 2021-09-20 LAB — THYROID PANEL WITH TSH
Free Thyroxine Index: 1.6 (ref 1.2–4.9)
T3 Uptake Ratio: 26 % (ref 24–39)
T4, Total: 6.1 ug/dL (ref 4.5–12.0)
TSH: 3.8 u[IU]/mL (ref 0.450–4.500)

## 2021-09-23 ENCOUNTER — Telehealth: Payer: Self-pay | Admitting: Nurse Practitioner

## 2021-09-23 ENCOUNTER — Other Ambulatory Visit: Payer: Self-pay | Admitting: Nurse Practitioner

## 2021-09-23 NOTE — Telephone Encounter (Signed)
S/W and updated patient and wife regarding blood work. K+ is 5.4. Discussed with them to hold Spironolactone X 3 days and then resume this Thursday, 9/7. Wife does state his energy has been low. I stated the rest of the blood work was normal and that he will come back the following week for Echo and Carotids and they verbalized understanding and were appreciative of my call.   [name], NP

## 2021-09-25 ENCOUNTER — Other Ambulatory Visit: Payer: Self-pay | Admitting: *Deleted

## 2021-09-25 DIAGNOSIS — E875 Hyperkalemia: Secondary | ICD-10-CM

## 2021-09-26 ENCOUNTER — Telehealth (HOSPITAL_BASED_OUTPATIENT_CLINIC_OR_DEPARTMENT_OTHER): Payer: Self-pay

## 2021-09-26 MED ORDER — SPIRONOLACTONE 25 MG PO TABS: 12.5000 mg | ORAL_TABLET | Freq: Every day | ORAL | 3 refills | Status: DC

## 2021-09-26 NOTE — Telephone Encounter (Signed)
Rx updated at request of NP   "Hello,   Spoke to patient and wife via telephone and updated them regarding labs. Can we please decrease Spironolactone daily dose to 12.5 mg? Wife stated he is only taking half a tablet, total of 12.5 mg daily. Can we please update medication records to reflect this change?   Thanks so much!   Kind Regards,  [name], NP "

## 2021-09-26 NOTE — Telephone Encounter (Signed)
-----   Message from [name], NP sent at 09/23/2021  5:42 PM EDT ----- Regarding: Adjust Spironolactone Hello,  Spoke to patient and wife via telephone and updated them regarding labs. Can we please decrease Spironolactone daily dose to 12.5 mg? Wife stated he is only taking half a tablet, total of 12.5 mg daily. Can we please update medication records to reflect this change?  Thanks so much!   Kind Regards,  [name], NP

## 2021-09-28 DIAGNOSIS — E875 Hyperkalemia: Secondary | ICD-10-CM | POA: Diagnosis not present

## 2021-09-29 LAB — BASIC METABOLIC PANEL
BUN/Creatinine Ratio: 23 (ref 10–24)
BUN: 32 mg/dL — ABNORMAL HIGH (ref 8–27)
CO2: 22 mmol/L (ref 20–29)
Calcium: 10 mg/dL (ref 8.6–10.2)
Chloride: 103 mmol/L (ref 96–106)
Creatinine, Ser: 1.37 mg/dL — ABNORMAL HIGH (ref 0.76–1.27)
Glucose: 104 mg/dL — ABNORMAL HIGH (ref 70–99)
Potassium: 5.2 mmol/L (ref 3.5–5.2)
Sodium: 141 mmol/L (ref 134–144)
eGFR: 52 mL/min/{1.73_m2} — ABNORMAL LOW (ref >59)

## 2021-10-01 ENCOUNTER — Telehealth (HOSPITAL_BASED_OUTPATIENT_CLINIC_OR_DEPARTMENT_OTHER): Payer: Self-pay

## 2021-10-01 DIAGNOSIS — I10 Essential (primary) hypertension: Secondary | ICD-10-CM

## 2021-10-01 NOTE — Telephone Encounter (Addendum)
Results called to patient who verbalizes understanding!  Labs ordered and patient will have done after echo on 9/14    ----- Message from [name], NP sent at 09/30/2021  3:08 PM EDT ----- Kidney function is elevated. I believe he was probably dehydrated. Let's have him stay well hydrated and repeat a BMET in 1-2 weeks. If next one is still elevated, I plan to adjust his medications.   Thanks!  [name], AGNP-C

## 2021-10-04 ENCOUNTER — Ambulatory Visit (INDEPENDENT_AMBULATORY_CARE_PROVIDER_SITE_OTHER): Payer: Medicare HMO

## 2021-10-04 DIAGNOSIS — R55 Syncope and collapse: Secondary | ICD-10-CM

## 2021-10-04 DIAGNOSIS — I10 Essential (primary) hypertension: Secondary | ICD-10-CM

## 2021-10-04 DIAGNOSIS — E7800 Pure hypercholesterolemia, unspecified: Secondary | ICD-10-CM

## 2021-10-04 DIAGNOSIS — I25118 Atherosclerotic heart disease of native coronary artery with other forms of angina pectoris: Secondary | ICD-10-CM

## 2021-10-04 LAB — ECHOCARDIOGRAM COMPLETE
AR max vel: 1.95 cm2
AV Area VTI: 2.11 cm2
AV Area mean vel: 2 cm2
AV Mean grad: 14 mmHg
AV Peak grad: 26 mmHg
Ao pk vel: 2.55 m/s
Area-P 1/2: 2.83 cm2
MV M vel: 5.61 m/s
MV Peak grad: 125.9 mmHg
Radius: 0.4 cm
S' Lateral: 2.44 cm

## 2021-10-05 LAB — BASIC METABOLIC PANEL
BUN/Creatinine Ratio: 18 (ref 10–24)
BUN: 21 mg/dL (ref 8–27)
CO2: 25 mmol/L (ref 20–29)
Calcium: 9.9 mg/dL (ref 8.6–10.2)
Chloride: 105 mmol/L (ref 96–106)
Creatinine, Ser: 1.16 mg/dL (ref 0.76–1.27)
Glucose: 93 mg/dL (ref 70–99)
Potassium: 4.9 mmol/L (ref 3.5–5.2)
Sodium: 142 mmol/L (ref 134–144)
eGFR: 63 mL/min/{1.73_m2} (ref >59)

## 2021-10-08 ENCOUNTER — Telehealth (HOSPITAL_BASED_OUTPATIENT_CLINIC_OR_DEPARTMENT_OTHER): Payer: Self-pay

## 2021-10-08 NOTE — Telephone Encounter (Addendum)
Results called to patient who verbalizes understanding!     ----- Message from [name], NP sent at 10/08/2021  3:36 PM EDT ----- "Please let [name] [name] know that his echocardiogram revealed normal, strong heart pumping function -EF is 60 to 65%.  There is moderate left ventricular hypertrophy noted (this muscle is thick), heart is a little stiff-grade 2 diastolic dysfunction.  Right ventricle is mildly enlarged in size.  Left atrium is mildly dilated in size.  Mild leakiness of the mitral valve with no mitral stenosis.  His aortic valve that has been replaced is working well.  All other findings are normal.  Please let [name] know that the carotid Doppler showed minimal plaque or thickening on the right carotid and 1 to 39% stenosis in the left carotid.  Normal blood flow seen bilaterally throughout.  We will continue to monitor this for now.  I will see him back at [facility] one week from today. At next visit, I plan to discuss with him about increasing his statin.   Thanks so much!   [name], AGNP-C"

## 2021-10-09 ENCOUNTER — Other Ambulatory Visit: Payer: Self-pay | Admitting: Cardiovascular Disease

## 2021-10-09 NOTE — Telephone Encounter (Signed)
Rx request sent to pharmacy.  

## 2021-10-12 DIAGNOSIS — C44329 Squamous cell carcinoma of skin of other parts of face: Secondary | ICD-10-CM | POA: Diagnosis not present

## 2021-10-12 DIAGNOSIS — D485 Neoplasm of uncertain behavior of skin: Secondary | ICD-10-CM | POA: Diagnosis not present

## 2021-10-12 DIAGNOSIS — C44529 Squamous cell carcinoma of skin of other part of trunk: Secondary | ICD-10-CM | POA: Diagnosis not present

## 2021-10-12 DIAGNOSIS — C44629 Squamous cell carcinoma of skin of left upper limb, including shoulder: Secondary | ICD-10-CM | POA: Diagnosis not present

## 2021-10-14 NOTE — Progress Notes (Signed)
Cardiology Office Note:    Date:  10/15/2021   ID:  [name] [name], DOB 1940/10/26, MRN [id number]  PCP:  [name], MD   [place] Providers Cardiologist:  [name], MD     Referring MD: [name], MD   CC: Here for follow-up  History of Present Illness:    [name] [name] is a 81 y.o. male with a hx of the following:   HTN CAD, s/p CABG X 4 2016 Aortic stenosis S/P aortic valve replacement with bioprosthetic valve 2016 Moderate asymmetric LVH of basal-septal segment (09/2021) Bilateral carotid artery disease Pure hypercholesterolemia Varicose veins of left lower extremity GERD Hx of syncope and collapse Bradycardia Fatigue    Underwent CABG x 4 and AVR on 09/2014, postoperative course was complicated by A-flutter/A-fib and was discharged in NSR. In August 2017 had an episode of lightheadedness and presented to the ED, had acute renal failure and felt to be dehydrated due to doing yard work for several hours the day before, wasn't well-hydrated. Metoprolol was switched to carvedilol, was started on amlodipine and simvastatin was switched Lipitor.  Lower extremity Dopplers in 2020 negative for DVT. Unable to tolerate amlodipine due to lower extremity swelling, spironolactone initiated. Had an episode of syncope, BP droppped to 86/45 in 2022, was working outside and wasn't eating or drinking much. He went to the bathroom and had to sit down. As he sat down on his bed and stood back up he passed out. It was thought that his symptoms were orthostatic as he has had these episodes before, amlodipine was discontinued. Previous cardiac work-up included echocardiogram in April 2022 that revealed LVEF of 55%, aortic valve replacement functioning well, with grade 1 diastolic dysfunction. Nuclear stress test revealed LVEF 63% with no ischemia. Was feeling well at office visit with Dr. Oval Linsey in 2022. BP was well controlled. Denied any cardiac complaints.   I  last saw this patient on 09/18/2021 with CC recent syncope and collapse episode in July 2023, did some yard work when some hornets stung him on the earlobe and his hand.  Said he made a quick movement, turned around and passed out and fell to the ground.  Denied any vision loss, prodromal symptoms, vertigo.  Nose was hurt and face was bruised.  Admitted to some occasional lightheadedness while bending over.  Denied any dizziness, presyncopal, or syncopal episodes. Denied any other cardiac complaints or issues. EKG showed SB, LAFB, minimal LVH, ST segment changes, otherwise no acute changes, similar to EKG from 11/2020. Echo 09/2020 LVEF 60-65%, moderate asymmetric LVH of the basal-septal segment, grade 2 DD, mild MR, normal prosthetic function of aortic valve replacement. Carotid dopplers showed minimal plaque in the right carotid and 1-39% stenosis in the left carotid. Lab work unremarkable.   Today he presents for a 1 month follow-up with his wife.  Overall he is doing well from a cardiac perspective. Denies any chest pain, shortness of breath, palpitations, syncope, presyncope, lightheadedness, orthopnea, PND, swelling, weight changes, bleeding, or claudication. Still has some dizziness with bending over, only occurs about once per month and not bothersome. Wife states he does not drink enough liquid during the day. Says SBP mainly around 130's to 140's at home. Denies any other questions or concerns today.     Past Medical History:  Diagnosis Date   A-fib [facility])    Arthritis    knee   Cataracts, bilateral    Chronic kidney disease    stage 2  Coronary artery disease    Fatigue 04/05/2020   GERD (gastroesophageal reflux disease)    Glaucoma    Hearing loss    wears hearing aids   History of kidney stones    passed stones and 1 time had surgery   Hyperlipidemia    Hypertension    Inguinal hernia 03/2019   Prostate cancer ([place])    Pure hypercholesterolemia 03/23/2019   S/P aortic valve  replacement with bioprosthetic valve 03/23/2019   Bioprosthetic 09/2014   Shingles    Varicose veins 03/31/2015   Wears partial dentures    upper    Past Surgical History:  Procedure Laterality Date   AORTIC VALVE REPLACEMENT N/A 09/28/2014   Procedure: AORTIC VALVE REPLACEMENT (AVR);  Surgeon: [name], MD;  Location: [place];  Service: Open Heart Surgery;  Laterality: N/A;   CARDIAC CATHETERIZATION N/A 09/23/2014   Procedure: Left Heart Cath and Coronary Angiography;  Surgeon: [name], MD;  Location: [place] [facility];  Service: Cardiovascular;  Laterality: N/A;   CATARACT EXTRACTION     CHOLECYSTECTOMY N/A 10/28/2016   Procedure: LAPAROSCOPIC CHOLECYSTECTOMY;  Surgeon: Ralene Ok, MD;  Location: [place];  Service: General;  Laterality: N/A;   COLONOSCOPY     CORONARY ARTERY BYPASS GRAFT N/A 09/28/2014   Procedure: CORONARY ARTERY BYPASS GRAFTING (CABG) x4 using left internal mammory artery and right saphenous leg vein harvested endoscopically.;  Surgeon: [name], MD;  Location: [place];  Service: Open Heart Surgery;  Laterality: N/A;   INGUINAL HERNIA REPAIR Right 04/12/2019   Procedure: LAPAROSCOPIC RIGHT INGUINAL HERNIA REPAIR WITH MESH;  Surgeon: Ralene Ok, MD;  Location: [place];  Service: General;  Laterality: Right;   kidney stone removal     PROSTATECTOMY     TEE WITHOUT CARDIOVERSION N/A 09/28/2014   Procedure: TRANSESOPHAGEAL ECHOCARDIOGRAM (TEE);  Surgeon: [name], MD;  Location: [place];  Service: Open Heart Surgery;  Laterality: N/A;   TONSILLECTOMY     TOTAL KNEE ARTHROPLASTY Left 03/12/2021   Procedure: TOTAL KNEE ARTHROPLASTY;  Surgeon: Gaynelle Arabian, MD;  Location: [facility];  Service: Orthopedics;  Laterality: Left;   WISDOM TOOTH EXTRACTION      Current Medications: Current Meds  Medication Sig   acetaminophen (TYLENOL) 500 MG tablet Take 1,000 mg by mouth every 6 (six) hours as needed for headache (pain).    acyclovir  (ZOVIRAX) 800 MG tablet Take 400 mg by mouth in the morning and at bedtime.   aspirin EC 81 MG tablet Take 81 mg by mouth daily. Swallow whole.   carvedilol (COREG) 6.25 MG tablet Take 1 tablet (6.25 mg total) by mouth 2 (two) times daily.   esomeprazole (NEXIUM) 20 MG capsule Take 20 mg by mouth every other day. Reported on 03/01/2015   lisinopril (ZESTRIL) 20 MG tablet TAKE 1 TABLET TWICE DAILY   oxybutynin (DITROPAN) 5 MG tablet Take 5 mg by mouth 2 (two) times daily.    prednisoLONE acetate (PRED FORTE) 1 % ophthalmic suspension Place 1 drop into the left eye in the morning and at bedtime.   timolol (TIMOPTIC) 0.5 % ophthalmic solution Place 1 drop into the left eye 2 (two) times daily.   atorvastatin (LIPITOR) 40 MG tablet TAKE 1 TABLET AT BEDTIME   spironolactone (ALDACTONE) 25 MG tablet Take 0.5 tablets (12.5 mg total) by mouth daily. Please schedule appointment for refills.     Allergies:   Hydrochlorothiazide   Social History   Socioeconomic History  Marital status: Married    Spouse name: Not on file   Number of children: Not on file   Years of education: Not on file   Highest education level: Not on file  Occupational History   Not on file  Tobacco Use   Smoking status: Former    Types: Cigarettes    Quit date: 05/22/1980    Years since quitting: 41.4   Smokeless tobacco: Never   Tobacco comments:    quit smoking in 1982  Vaping Use   Vaping Use: Never used  Substance and Sexual Activity   Alcohol use: Yes    Alcohol/week: 0.0 standard drinks of alcohol    Comment: occasional liquor/ Beer   Drug use: No   Sexual activity: Yes  Other Topics Concern   Not on file  Social History Narrative   Pt leaving with wife.   Social Determinants of Health   Financial Resource Strain: Not on file  Food Insecurity: Not on file  Transportation Needs: Not on file  Physical Activity: Not on file  Stress: Not on file  Social Connections: Not on file     Family History: The  patient's family history includes Cancer in his mother; Ovarian cancer in his mother.  ROS:   Review of Systems  Constitutional: Negative.   HENT: Negative.    Eyes: Negative.   Respiratory: Negative.    Cardiovascular: Negative.   Gastrointestinal: Negative.   Genitourinary: Negative.   Musculoskeletal: Negative.   Skin: Negative.   Neurological:  Positive for dizziness. Negative for tingling, tremors, sensory change, speech change, focal weakness, seizures, loss of consciousness, weakness and headaches.       See HPI.  Endo/Heme/Allergies: Negative.   Psychiatric/Behavioral: Negative.      Please see the history of present illness.    All other systems reviewed and are negative.  EKGs/Labs/Other Studies Reviewed:    The following studies were reviewed today:   EKG:  EKG is not ordered today.  2D Echocardiogram Complete on 10/04/2021: 1. Left ventricular ejection fraction, by estimation, is 60 to 65%. The  left ventricle has normal function. The left ventricle has no regional  wall motion abnormalities. There is moderate asymmetric left ventricular  hypertrophy of the basal-septal  segment. Left ventricular diastolic parameters are consistent with Grade  II diastolic dysfunction (pseudonormalization).   2. Right ventricular systolic function is normal. The right ventricular  size is mildly enlarged. There is normal pulmonary artery systolic  pressure.   3. Left atrial size was mildly dilated.   4. The mitral valve is grossly normal. Mild mitral valve regurgitation.  No evidence of mitral stenosis.   5. The aortic valve has been replaced with a 25 mm Magna Ease  Bioprosthetic valve. Aortic valve regurgitation is not visualized (small  diastolic flow in PLAX view is likely coronary). Effectiveorifice area, by  VTI measures 2.11 cm. Aortic valve mean  gradient measures 14.0 mmHg. Aortic valve acceleration time measures 85  msec. Normal prosthetic function.   6. The  inferior vena cava is normal in size with greater than 50%  respiratory variability, suggesting right atrial pressure of 3 mmHg.   Bilateral Carotid Duplex on 10/04/2021: Right Carotid: The extracranial vessels were near-normal with only minimal wall thickening or plaque.  Left Carotid: Velocities in the left ICA are consistent with a 1-39%  stenosis.  Vertebrals: Bilateral vertebral arteries demonstrate antegrade flow.  Subclavians: Normal flow hemodynamics were seen in bilateral subclavian arteries.   Nuclear medicine stress  test on May 03, 2020: LVEF is normal (55 to 65%).  Nuclear stress EF: 63%.  There was no ST segment deviation noted during stress.  This low risk study.  There is a small, moderate, reversible defect in the inferoapex.  Wall motion is normal in this region.  There is significant extracardiac bowel tracer uptake adjacent to the inferoapex with stress on both supine and upright stress images.  This finding likely represents attenuation artifact from extracardiac tracer uptake, but cannot exclude a small area of ischemia  2D echocardiogram on May 03, 2020: 1. Left ventricular ejection fraction, by estimation, is 55%. The left  ventricle has normal function. The left ventricle has no regional wall  motion abnormalities. There is mild left ventricular hypertrophy. Left  ventricular diastolic parameters are  consistent with Grade I diastolic dysfunction (impaired relaxation).   2. Right ventricular systolic function is normal. The right ventricular  size is normal. There is normal pulmonary artery systolic pressure. The  estimated right ventricular systolic pressure is 96.2 mmHg.   3. Left atrial size was mild to moderately dilated.   4. Right atrial size was mildly dilated.   5. The mitral valve is normal in structure. Mild mitral valve  regurgitation. No evidence of mitral stenosis.   6. Tricuspid valve regurgitation is mild to moderate.   7. Bioprosthetic aortic  valve replacement. Mean gradient 18 mmHg. No  significant regurgitation.   8. The inferior vena cava is normal in size with greater than 50%  respiratory variability, suggesting right atrial pressure of 3 mmHg.    Vascular ultrasound lower extremity venous Doppler (DVT) on March 25, 2018: Right: No evidence of deep vein thrombosis in the lower extremity. No  indirect evidence of obstruction proximal to the inguinal ligament.  Left: No evidence of common femoral vein obstruction.    Left heart catheterization and coronary angiography on September 23, 2014: Severe multivessel disease: Prox LAD to Mid LAD lesion, 95% stenosed. Ramus lesion, 95% stenosed. Ost 1st Diag lesion, 90% stenosed. Dist RCA lesion, 90% stenosed. 1st Mrg lesion, 60% stenosed. Dist LAD lesion, 40% stenosed. There is mild left ventricular systolic dysfunction. There does appear to be a distal LAD distribution wall motion abnormality     Severe multivessel disease involving the ostial-proximal LAD and what appears to be either a bifurcating diagonal or ramus and diagonal branch. There is also focal distal RCA disease.   Based on the location of the LAD lesion, there is not adequate landing zone for a stent prior to the Left Main. Additionally, with involvement of 2 major branches this would be a very high-risk PCI. Overall best recommendation would be to pursue CABG.   The patient also has a significant aortic valve murmur with gradient noted by catheterization. Recommend echocardiogram to better assess left ventricular outflow/aortic valve stenosis. Pending the severity of aortic valve disease, would consider potentially Performing Aortic Valve Replacement in Addition to CABG.     Plan: The patient will be admitted to the TCU STEPDOWN Unit for close monitoring. Cardiac surgery has been consulted by Dr. Sallyanne Kuster Will initiate IV heparin 6 hours following TR band removal Aggressive risk factor modification. Blood pressure  were tolerated, would consider IV nitroglycerin.  Recent Labs: 02/27/2021: ALT 24 09/19/2021: Hemoglobin 13.7; Magnesium 2.1; Platelets 190; TSH 3.800 10/04/2021: BUN 21; Creatinine, Ser 1.16; Potassium 4.9; Sodium 142  Recent Lipid Panel No results found for: "CHOL", "TRIG", "HDL", "CHOLHDL", "VLDL", "LDLCALC", "LDLDIRECT"  Physical Exam:    VS:  BP (!) 130/92   Pulse (!) 56   Ht '6\' 1"'$  (1.854 m)   Wt 173 lb (78.5 kg)   BMI 22.82 kg/m     Wt Readings from Last 3 Encounters:  10/15/21 173 lb (78.5 kg)  09/18/21 173 lb 3.2 oz (78.6 kg)  03/12/21 165 lb 3.2 oz (74.9 kg)     GEN: Well nourished, well developed in no acute distress HEENT: Normal NECK: No JVD; No carotid bruits CARDIAC: S1/S2, slow rate and regular some, AVR functioning well, no murmurs, rubs, gallops; 2+ peripheral pulses throughout, strong and equal bilaterally RESPIRATORY:  Clear to auscultation without rales, wheezing or rhonchi  MUSCULOSKELETAL:  No edema; No deformity  SKIN: Warm and dry NEUROLOGIC:  Alert and oriented x 3 PSYCHIATRIC:  Normal affect   ASSESSMENT:    1. Syncope and collapse   2. Coronary artery disease involving native coronary artery of native heart with unstable angina pectoris ([place])   3. S/P CABG x 4   4. Pure hypercholesterolemia   5. Bilateral carotid artery disease, unspecified type ([place])   6. Aortic valve stenosis, etiology of cardiac valve disease unspecified   7. S/P aortic valve replacement with bioprosthetic valve   8. Left ventricular hypertrophy   9. Hypertension, unspecified type    PLAN:    In order of problems listed above:  History of syncope and collapse Per wife's report, ? If this was true syncope and collapse versus falling and and not remembering event.  Unwitnessed event.  No more recurrent episodes. Lab work unremarkable, carotid Doppler minimal plaque on right carotid, 1 to 39% stenosis in the left carotid.  Recent echo LVEF 60 to 65%, aortic valve  working well.  Occasional dizziness while bending over, occurs about once per month.  Discussed conservative measures including adequate hydration, compression stockings, and changing positions slowly.  2.  Coronary artery disease,  s/p CABG x 4, pure hypercholesterolemia with LDL goal < 70, carotid artery disease Stable no anginal symptoms.  No indication for ischemic evaluation.  LDL from last December was 56.  Carotid Doppler revealed minimal plaque on the right carotid, 1 to 39% stenosis of the left carotid.  Home medication list states he is only taking 40 mg of Lipitor daily.  Increase Lipitor from 40 mg to 80 mg daily.  Repeat FLP and LFT in 2 months.   3.  Aortic valve stenosis, s/p aortic valve replacement with bioprosthetic valve-chronic, stable AVR with bioprosthetic valve in 2016.  Recent 2D echo revealed LVEF 60 to 65%.  Mention to patient and wife about SBE prophylaxis, including amoxicillin to take as needed for dental procedures, they are aware.  Continue current medication regimen.  4.  Left ventricular hypertrophy Recent echo revealed moderate asymmetric left ventricular hypertrophy of the basal-septal segment, grade 2 diastolic dysfunction, this finding has worsened since last echocardiogram in April 2022.  No LVOT noted. discussed BP management and control.  Increase spironolactone from 12.5 to 25 mg daily.  Repeat BMET in 1 week.  Discussed adequate hydration of 32 to 64 ounces daily and to report worsening dizziness or symptoms.  5.  HTN Did not bring BP log to visit today, but reports average SBP 130s to 140s.  Increase spironolactone from 12.5 to 25 mg daily.  Repeat BMET in 1 week. Discussed to monitor BP at home at least 2 hours after medications and sitting for 5-10 minutes.  6.  Disposition: Follow-up with APP  or Dr. Oval Linsey in 6 weeks or sooner if anything changes.    Medication Adjustments/Labs and Tests Ordered: Current medicines are reviewed at length with the  patient today.  Concerns regarding medicines are outlined above.  Orders Placed This Encounter  Procedures   Basic metabolic panel   Lipid panel   Hepatic function panel   Meds ordered this encounter  Medications   DISCONTD: atorvastatin (LIPITOR) 80 MG tablet    Sig: Take 1 tablet (80 mg total) by mouth at bedtime.    Dispense:  90 tablet    Refill:  3   DISCONTD: spironolactone (ALDACTONE) 25 MG tablet    Sig: Take 1 tablet (25 mg total) by mouth daily. Please schedule appointment for refills.    Dispense:  90 tablet    Refill:  3   atorvastatin (LIPITOR) 80 MG tablet    Sig: Take 1 tablet (80 mg total) by mouth at bedtime.    Dispense:  90 tablet    Refill:  3   spironolactone (ALDACTONE) 25 MG tablet    Sig: Take 1 tablet (25 mg total) by mouth daily. Please schedule appointment for refills.    Dispense:  90 tablet    Refill:  3    Patient Instructions  Medication Instructions:  Your physician has recommended you make the following change in your medication:   Change: Atorvastatin '80mg'$  daily   Change: Spironolactone '25mg'$  daily   *If you need a refill on your cardiac medications before your next appointment, please call your pharmacy*   Lab Work: Please return for Lab work. In one week for BMP and in 2 months for fasting Lipid panel and liver function tests. You may come to the...   [facility] (3rd floor) [address] [place], [place] [address]  Open: 8am-Noon and 1pm-4:30pm  Please ring the doorbell on the small table when you exit the elevator and the Lab Tech will come get you  [place] at [facility] [address] [place], [place], [place] [address] Open: 8am-1pm, then 2pm-4:30pm   [place]- Please see attached locations sheet stapled to your lab work with address and hours.  If you have labs (blood work) drawn today and your tests are completely normal, you will receive your results only by: [place] (if  you have MyChart) OR A paper copy in the mail If you have any lab test that is abnormal or we need to change your treatment, we will call you to review the results.  Follow-Up: At [facility], you and your health needs are our priority.  As part of our continuing mission to provide you with exceptional heart care, we have created designated Provider Care Teams.  These Care Teams include your primary Cardiologist (physician) and Advanced Practice Providers (APPs -  Physician Assistants and Nurse Practitioners) who all work together to provide you with the care you need, when you need it.  We recommend signing up for the patient portal called "MyChart".  Sign up information is provided on this After Visit Summary.  MyChart is used to connect with patients for Virtual Visits (Telemedicine).  Patients are able to view lab/test results, encounter notes, upcoming appointments, etc.  Non-urgent messages can be sent to your provider as well.   To learn more about what you can do with MyChart, go to [email or web address].    Your next appointment:   6 weeks in person with Dr. Oval Linsey or Laurann [place], NP   Other  Instructions Heart Healthy Diet Recommendations: A low-salt diet is recommended. Meats should be grilled, baked, or boiled. Avoid fried foods. Focus on lean protein sources like fish or chicken with vegetables and fruits. The American Heart Association is a Microbiologist!  American Heart Association Diet and Lifeystyle Recommendations   Exercise recommendations: The American Heart Association recommends 150 minutes of moderate intensity exercise weekly. Try 30 minutes of moderate intensity exercise 4-5 times per week. This could include walking, jogging, or swimming.   Important Information About Sugar         Signed, [name], NP  10/15/2021 11:42 AM    [place]

## 2021-10-15 ENCOUNTER — Ambulatory Visit (HOSPITAL_BASED_OUTPATIENT_CLINIC_OR_DEPARTMENT_OTHER): Payer: Medicare HMO | Admitting: Nurse Practitioner

## 2021-10-15 ENCOUNTER — Encounter (HOSPITAL_BASED_OUTPATIENT_CLINIC_OR_DEPARTMENT_OTHER): Payer: Self-pay | Admitting: Nurse Practitioner

## 2021-10-15 VITALS — BP 130/92 | HR 56 | Ht 73.0 in | Wt 173.0 lb

## 2021-10-15 DIAGNOSIS — R55 Syncope and collapse: Secondary | ICD-10-CM

## 2021-10-15 DIAGNOSIS — E7800 Pure hypercholesterolemia, unspecified: Secondary | ICD-10-CM

## 2021-10-15 DIAGNOSIS — Z951 Presence of aortocoronary bypass graft: Secondary | ICD-10-CM

## 2021-10-15 DIAGNOSIS — I350 Nonrheumatic aortic (valve) stenosis: Secondary | ICD-10-CM

## 2021-10-15 DIAGNOSIS — Z953 Presence of xenogenic heart valve: Secondary | ICD-10-CM

## 2021-10-15 DIAGNOSIS — I10 Essential (primary) hypertension: Secondary | ICD-10-CM | POA: Diagnosis not present

## 2021-10-15 DIAGNOSIS — I517 Cardiomegaly: Secondary | ICD-10-CM | POA: Diagnosis not present

## 2021-10-15 DIAGNOSIS — I25110 Atherosclerotic heart disease of native coronary artery with unstable angina pectoris: Secondary | ICD-10-CM | POA: Diagnosis not present

## 2021-10-15 DIAGNOSIS — I779 Disorder of arteries and arterioles, unspecified: Secondary | ICD-10-CM | POA: Diagnosis not present

## 2021-10-15 MED ORDER — SPIRONOLACTONE 25 MG PO TABS: 25.0000 mg | ORAL_TABLET | Freq: Every day | ORAL | 3 refills | Status: DC

## 2021-10-15 MED ORDER — ATORVASTATIN CALCIUM 80 MG PO TABS: 80.0000 mg | ORAL_TABLET | Freq: Every day | ORAL | 3 refills | Status: DC

## 2021-10-15 NOTE — Patient Instructions (Signed)
Medication Instructions:  Your physician has recommended you make the following change in your medication:   Change: Atorvastatin '80mg'$  daily   Change: Spironolactone '25mg'$  daily   *If you need a refill on your cardiac medications before your next appointment, please call your pharmacy*   Lab Work: Please return for Lab work. In one week for BMP and in 2 months for fasting Lipid panel and liver function tests. You may come to the...   [facility] (3rd floor) [address] [place], [place] [address]  Open: 8am-Noon and 1pm-4:30pm  Please ring the doorbell on the small table when you exit the elevator and the Lab Tech will come get you  [place] at [facility] [address] [place], [place],  [address] Open: 8am-1pm, then 2pm-4:30pm   [place]- Please see attached locations sheet stapled to your lab work with address and hours.  If you have labs (blood work) drawn today and your tests are completely normal, you will receive your results only by: [place] (if you have MyChart) OR A paper copy in the mail If you have any lab test that is abnormal or we need to change your treatment, we will call you to review the results.  Follow-Up: At [facility], you and your health needs are our priority.  As part of our continuing mission to provide you with exceptional heart care, we have created designated Provider Care Teams.  These Care Teams include your primary Cardiologist (physician) and Advanced Practice Providers (APPs -  Physician Assistants and Nurse Practitioners) who all work together to provide you with the care you need, when you need it.  We recommend signing up for the patient portal called "MyChart".  Sign up information is provided on this After Visit Summary.  MyChart is used to connect with patients for Virtual Visits (Telemedicine).  Patients are able to view lab/test results, encounter notes, upcoming  appointments, etc.  Non-urgent messages can be sent to your provider as well.   To learn more about what you can do with MyChart, go to [email or web address].    Your next appointment:   6 weeks in person with Dr. Oval Linsey or Laurann [place], NP   Other Instructions Heart Healthy Diet Recommendations: A low-salt diet is recommended. Meats should be grilled, baked, or boiled. Avoid fried foods. Focus on lean protein sources like fish or chicken with vegetables and fruits. The American Heart Association is a Microbiologist!  American Heart Association Diet and Lifeystyle Recommendations   Exercise recommendations: The American Heart Association recommends 150 minutes of moderate intensity exercise weekly. Try 30 minutes of moderate intensity exercise 4-5 times per week. This could include walking, jogging, or swimming.   Important Information About Sugar

## 2021-10-21 DIAGNOSIS — M791 Myalgia, unspecified site: Secondary | ICD-10-CM | POA: Diagnosis not present

## 2021-10-21 DIAGNOSIS — R059 Cough, unspecified: Secondary | ICD-10-CM | POA: Diagnosis not present

## 2021-10-21 DIAGNOSIS — R519 Headache, unspecified: Secondary | ICD-10-CM | POA: Diagnosis not present

## 2021-10-21 DIAGNOSIS — R5381 Other malaise: Secondary | ICD-10-CM | POA: Diagnosis not present

## 2021-10-21 DIAGNOSIS — U071 COVID-19: Secondary | ICD-10-CM | POA: Diagnosis not present

## 2021-10-21 DIAGNOSIS — I10 Essential (primary) hypertension: Secondary | ICD-10-CM | POA: Diagnosis not present

## 2021-10-24 ENCOUNTER — Ambulatory Visit: Payer: Medicare HMO | Admitting: Internal Medicine

## 2021-11-01 ENCOUNTER — Encounter (HOSPITAL_BASED_OUTPATIENT_CLINIC_OR_DEPARTMENT_OTHER): Payer: Self-pay | Admitting: Cardiovascular Disease

## 2021-11-01 ENCOUNTER — Ambulatory Visit (HOSPITAL_BASED_OUTPATIENT_CLINIC_OR_DEPARTMENT_OTHER): Payer: Medicare HMO | Admitting: Cardiovascular Disease

## 2021-11-01 VITALS — BP 136/86 | HR 57 | Ht 73.0 in | Wt 166.8 lb

## 2021-11-01 DIAGNOSIS — Z951 Presence of aortocoronary bypass graft: Secondary | ICD-10-CM

## 2021-11-01 DIAGNOSIS — I951 Orthostatic hypotension: Secondary | ICD-10-CM | POA: Diagnosis not present

## 2021-11-01 DIAGNOSIS — I779 Disorder of arteries and arterioles, unspecified: Secondary | ICD-10-CM

## 2021-11-01 DIAGNOSIS — Z953 Presence of xenogenic heart valve: Secondary | ICD-10-CM

## 2021-11-01 DIAGNOSIS — I10 Essential (primary) hypertension: Secondary | ICD-10-CM

## 2021-11-01 DIAGNOSIS — E7800 Pure hypercholesterolemia, unspecified: Secondary | ICD-10-CM | POA: Diagnosis not present

## 2021-11-01 DIAGNOSIS — I25110 Atherosclerotic heart disease of native coronary artery with unstable angina pectoris: Secondary | ICD-10-CM

## 2021-11-01 HISTORY — DX: Orthostatic hypotension: I95.1

## 2021-11-01 MED ORDER — AMOXICILLIN 500 MG PO TABS: ORAL_TABLET | ORAL | 1 refills | Status: AC

## 2021-11-01 NOTE — Assessment & Plan Note (Signed)
Blood pressures remain labile.  He is tolerating his current doses of carvedilol, lisinopril, and spironolactone.  He does have orthostatic hypotension and has had some syncope and falls.  Therefore he has some permissive hypertension and we are targeting a blood pressure around 140/80-90.  Spironolactone was recently increased and he is stable.  If he does start to develop hypotension we will reduce it back to 12.5 mg daily.

## 2021-11-01 NOTE — Assessment & Plan Note (Signed)
Reduce atorvastatin back to 40 mg as above.

## 2021-11-01 NOTE — Patient Instructions (Signed)
Medication Instructions:  TAKE AMOXIL 500 MG 4 TABLETS 1 HOUR PRIOR TO PROCEDURE   DECREASE YOUR ATORVASTATIN TO 40 MG DAILY   *If you need a refill on your cardiac medications before your next appointment, please call your pharmacy*  Lab Work: NONE  Testing/Procedures: NONE  Follow-Up: At [facility], you and your health needs are our priority.  As part of our continuing mission to provide you with exceptional heart care, we have created designated Provider Care Teams.  These Care Teams include your primary Cardiologist (physician) and Advanced Practice Providers (APPs -  Physician Assistants and Nurse Practitioners) who all work together to provide you with the care you need, when you need it.  We recommend signing up for the patient portal called "MyChart".  Sign up information is provided on this After Visit Summary.  MyChart is used to connect with patients for Virtual Visits (Telemedicine).  Patients are able to view lab/test results, encounter notes, upcoming appointments, etc.  Non-urgent messages can be sent to your provider as well.   To learn more about what you can do with MyChart, go to [email or web address].    Your next appointment:   6 month(s)  The format for your next appointment:   In Person  Provider:   DR [facility]

## 2021-11-01 NOTE — Assessment & Plan Note (Signed)
Mild right stenosis on recent imaging.  Lipids are well controlled on atorvastatin.  His dose was increased to 80 mg but that is not necessary.  He was controlled on 40 mg.  Continue aspirin.

## 2021-11-01 NOTE — Assessment & Plan Note (Signed)
Prostatic aortic valve stable on recent echo.  He is euvolemic.  He has upcoming surgical procedures.  He will take amoxicillin prophylaxis 2 g 30 minutes to an hour before the procedure.

## 2021-11-01 NOTE — Progress Notes (Signed)
Cardiology office note   Date:  11/01/2021   ID:  [name] [name], DOB 1940-03-17, MRN [id number]   PCP:  [name], MD  Cardiologist:  [name], MD  Electrophysiologist:  None   Evaluation Performed:  Follow-Up Visit  Chief Complaint:  Hypertension, edema  History of Present Illness:     [name] [name] is a 81 y.o. male with hypertension, CAD s/p CABG (LIMA-->LAD, SVG-->RI/OM1, SVG-->PDA), aortic stenosis s/p AVR, bradycardia, and syncope who presents for follow up.  [name] underwent 4 vessel CABG and AVR (25 mm [facility] Ease pericardial tissue valve) on 09/28/14.  His post-operative course was complicated by atrial fibrillation/flutter, but he was discharged in sinus rhythm.  He had an episode of lightheadedness in 08/2015 and was seen in the ED.  He was noted to have acute renal failure and was felt to be dehydrated. He had been working in the yard for several hours the day before and was not well hydrated.  Metoprolol was switched to carvedilol due to bradycardia and hypertension.  He was also started on amlodipine, so simvastatin was switched to atorvastatin.   [name] has some right lower extremity edema that is chronic.  He had lower extremity Dopplers 03/2018 that were negative for DVT.  His PCP recommended a compression stocking which does seem to help when he wears it. His blood pressure was elevated so amlodipine was increased.  However, [name] had increased lower extremity edema.  Therefore his dose was reduced and he was started on spironolactone.  Since then his BP has been much better.  Yesterday he had an episode of syncope.  His blood pressure dropped as low as 86/45.  He had been working outside vigorously and had not had much to eat or drink.  Recommended that he hold his spironolactone and check his blood pressure.  He recalls feeling nauseous.  He got up to go to the bathroom and had to sit down because he thought he would pass out.  He sat down on  his bed and when stood back out he passed out.  There was no chest pain or shortness of breath.  He broke out in a cold sweat.    [name] had episodes of hypotension and syncope.  This occurred in the setting of not eating and drinking well and weight loss.  His symptoms were thought to be orthostatic.  Amlodipine was discontinued.  He followed up with [name], DNP 02/2020 and was not orthostatic in the office though he did continue to report positional lightheadedness. At his last appointment [name] noticed a reduction in his exercise capacity.  He was referred for an echo 04/2020 that revealed LVEF 55%.  He had grade 1 diastolic dysfunction and his bioprosthetic aortic valve was functioning well.  The mean gradient was 18 mmHg.  He also had a nuclear stress test with LVEF 63% and no ischemia.  Spironolactone was reduced due to labile blood pressures  He follow-up with [name] in the next month and was stable but continued to report fatigue. He had his knee replaced 02/2021. His saw his PCP 08/2021 and reported an episode of syncope in July after being stung by hornets. He had an echocardiogram LVEF 60-65 and mod LDH His aortic valve gradient 15 mmHg. Carotid dopplers showed mild stenosis on the right.   Today, he has been doing pretty good lately despite having COVID about 3 weeks ago. He is still experiencing some fatigue but  is otherwise doing well. He has been doing well with his knee replacement. His knee has been fine even though he fell when he was stung by the hornets and went to the ED. He has not had any more syncope episodes since then. His blood pressure has been erratic. He does feel dizziness when he is bending over. His spironolactone was increased from 12.5 mg to 25 mg when he saw [name] NP on 10/15/2021. His atorvastatin was also increased from 40 mg to 80 mg. He was diagnosed with squamous cells that they determined need to be removed with surgery. It will be three  separate procedures. He wanted to make sure he is supposed to pre medicate with antibiotics before these procedures due to his valve. He denies any palpitations, chest pain, shortness of breath, or peripheral edema. No headaches, orthopnea, or PND.  Past Medical History:  Diagnosis Date   A-fib [facility])    Arthritis    knee   Cataracts, bilateral    Chronic kidney disease    stage 2   Coronary artery disease    Fatigue 04/05/2020   GERD (gastroesophageal reflux disease)    Glaucoma    Hearing loss    wears hearing aids   History of kidney stones    passed stones and 1 time had surgery   Hyperlipidemia    Hypertension    Inguinal hernia 03/2019   Orthostatic hypotension 11/01/2021   Prostate cancer ([place])    Pure hypercholesterolemia 03/23/2019   S/P aortic valve replacement with bioprosthetic valve 03/23/2019   Bioprosthetic 09/2014   Shingles    Varicose veins 03/31/2015   Wears partial dentures    upper    Past Surgical History:  Procedure Laterality Date   AORTIC VALVE REPLACEMENT N/A 09/28/2014   Procedure: AORTIC VALVE REPLACEMENT (AVR);  Surgeon: [name], MD;  Location: [place];  Service: Open Heart Surgery;  Laterality: N/A;   CARDIAC CATHETERIZATION N/A 09/23/2014   Procedure: Left Heart Cath and Coronary Angiography;  Surgeon: [name], MD;  Location: [place] [facility];  Service: Cardiovascular;  Laterality: N/A;   CATARACT EXTRACTION     CHOLECYSTECTOMY N/A 10/28/2016   Procedure: LAPAROSCOPIC CHOLECYSTECTOMY;  Surgeon: Ralene Ok, MD;  Location: [place];  Service: General;  Laterality: N/A;   COLONOSCOPY     CORONARY ARTERY BYPASS GRAFT N/A 09/28/2014   Procedure: CORONARY ARTERY BYPASS GRAFTING (CABG) x4 using left internal mammory artery and right saphenous leg vein harvested endoscopically.;  Surgeon: [name], MD;  Location: [place];  Service: Open Heart Surgery;  Laterality: N/A;   INGUINAL HERNIA REPAIR Right 04/12/2019   Procedure:  LAPAROSCOPIC RIGHT INGUINAL HERNIA REPAIR WITH MESH;  Surgeon: Ralene Ok, MD;  Location: [place];  Service: General;  Laterality: Right;   kidney stone removal     PROSTATECTOMY     TEE WITHOUT CARDIOVERSION N/A 09/28/2014   Procedure: TRANSESOPHAGEAL ECHOCARDIOGRAM (TEE);  Surgeon: [name], MD;  Location: [place];  Service: Open Heart Surgery;  Laterality: N/A;   TONSILLECTOMY     TOTAL KNEE ARTHROPLASTY Left 03/12/2021   Procedure: TOTAL KNEE ARTHROPLASTY;  Surgeon: Gaynelle Arabian, MD;  Location: [facility];  Service: Orthopedics;  Laterality: Left;   WISDOM TOOTH EXTRACTION       Current Outpatient Medications  Medication Sig Dispense Refill   acetaminophen (TYLENOL) 500 MG tablet Take 1,000 mg by mouth every 6 (six) hours as needed for headache (pain).  acyclovir (ZOVIRAX) 800 MG tablet Take 400 mg by mouth in the morning and at bedtime.     amoxicillin (AMOXIL) 500 MG tablet TAKE 4 TABLETS 1 HOUR PRIOR TO PROCEDURES 12 tablet 1   aspirin EC 81 MG tablet Take 81 mg by mouth daily. Swallow whole.     atorvastatin (LIPITOR) 80 MG tablet Take 40 mg by mouth daily.     carvedilol (COREG) 6.25 MG tablet Take 1 tablet (6.25 mg total) by mouth 2 (two) times daily. 180 tablet 1   esomeprazole (NEXIUM) 20 MG capsule Take 20 mg by mouth every other day. Reported on 03/01/2015     lisinopril (ZESTRIL) 20 MG tablet TAKE 1 TABLET TWICE DAILY 180 tablet 1   meloxicam (MOBIC) 15 MG tablet Take 15 mg by mouth daily.     oxybutynin (DITROPAN) 5 MG tablet Take 5 mg by mouth 2 (two) times daily.      prednisoLONE acetate (PRED FORTE) 1 % ophthalmic suspension Place 1 drop into the left eye in the morning and at bedtime.     spironolactone (ALDACTONE) 25 MG tablet Take 1 tablet (25 mg total) by mouth daily. Please schedule appointment for refills. 90 tablet 3   timolol (TIMOPTIC) 0.5 % ophthalmic solution Place 1 drop into the left eye 2 (two) times daily.     No current facility-administered  medications for this visit.    Allergies:   Hydrochlorothiazide    Social History:  The patient  reports that he quit smoking about 41 years ago. His smoking use included cigarettes. He has never used smokeless tobacco. He reports current alcohol use. He reports that he does not use drugs.   Family History:  The patient's family history includes Cancer in his mother; Ovarian cancer in his mother.    ROS:   Please see the history of present illness.  (+) syncope (after insect sting) (+) lightheadedness (+) fatigue All other systems are reviewed and negative.    PHYSICAL EXAM: VS:  BP 136/86 (BP Location: Left Arm, Patient Position: Sitting, Cuff Size: Normal)   Pulse (!) 57   Ht '6\' 1"'$  (1.854 m)   Wt 166 lb 12.8 oz (75.7 kg)   SpO2 95%   BMI 22.01 kg/m  , BMI Body mass index is 22.01 kg/m. GENERAL:  Well appearing HEENT: Pupils equal round and reactive, fundi not visualized, oral mucosa unremarkable NECK:  No jugular venous distention, waveform within normal limits, carotid upstroke brisk and symmetric, no bruits LUNGS:  Clear to auscultation bilaterally HEART:  RRR.  PMI not displaced or sustained,S1 and S2 within normal limits, no S3, no S4, no clicks, no rubs, II/VI  murmurs ABD:  Flat, positive bowel sounds normal in frequency in pitch, no bruits, no rebound, no guarding, no midline pulsatile mass, no hepatomegaly, no splenomegaly EXT:  2 plus pulses throughout, no edema, no cyanosis no clubbing SKIN:  No rashes no nodules NEURO:  Cranial nerves II through XII grossly intact, motor grossly intact throughout PSYCH:  Cognitively intact, oriented to person place and time   Recent Labs: 02/27/2021: ALT 24 09/19/2021: Hemoglobin 13.7; Magnesium 2.1; Platelets 190; TSH 3.800 10/04/2021: BUN 21; Creatinine, Ser 1.16; Potassium 4.9; Sodium 142   11/25/2017: Total cholesterol 114, triglycerides 91, HDL 40, LDL 55 Potassium 4.9, creatinine 0.9  Lipid Panel No results found for:  "CHOL", "TRIG", "HDL", "CHOLHDL", "VLDL", "LDLCALC", "LDLDIRECT"    Wt Readings from Last 3 Encounters:  11/01/21 166 lb 12.8 oz (75.7 kg)  10/15/21 173 lb (78.5 kg)  09/18/21 173 lb 3.2 oz (78.6 kg)     EKG : EKG is personally reviewed.  11/01/2021: EKG was not ordered.  03/21/17: Sinus rhythm.  Rate 60 bpm.  Left axis deviation.  03/23/18: Sinus bradycardia.  Rate 54 bpm.  First degree AV block.  LAFB.   03/23/19: Sinus rhythm.  Rate 60 bpm.  Left axis deviation.  Prior anteroseptal infarct.  IVCD. 11/27/2020: Sinus rhythm.  Rate 65 bpm.  LAFB.  IVCD.  Cannot rule out prior anteroseptal infarct.   Bilateral Carotid Doppler 10/05/2021: Summary:  Right Carotid: The extracranial vessels were near-normal with only minimal wall thickening or plaque.   Left Carotid: Velocities in the left ICA are consistent with a 1-39%  stenosis.   Vertebrals:  Bilateral vertebral arteries demonstrate antegrade flow.  Subclavians: Normal flow hemodynamics were seen in bilateral subclavian arteries.   ECHO 10/04/2021: IMPRESSIONS    1. Left ventricular ejection fraction, by estimation, is 60 to 65%. The  left ventricle has normal function. The left ventricle has no regional  wall motion abnormalities. There is moderate asymmetric left ventricular  hypertrophy of the basal-septal  segment. Left ventricular diastolic parameters are consistent with Grade  II diastolic dysfunction (pseudonormalization).   2. Right ventricular systolic function is normal. The right ventricular  size is mildly enlarged. There is normal pulmonary artery systolic  pressure.   3. Left atrial size was mildly dilated.   4. The mitral valve is grossly normal. Mild mitral valve regurgitation.  No evidence of mitral stenosis.   5. The aortic valve has been replaced with a 25 mm Magna Ease  Bioprosthetic valve. Aortic valve regurgitation is not visualized (small  diastolic flow in PLAX view is likely coronary). Effectiveorifice area,  by  VTI measures 2.11 cm. Aortic valve mean  gradient measures 14.0 mmHg. Aortic valve acceleration time measures 85  msec. Normal prosthetic function.   6. The inferior vena cava is normal in size with greater than 50%  respiratory variability, suggesting right atrial pressure of 3 mmHg.   Stress Test with Lexiscan 05/03/2020: The left ventricular ejection fraction is normal (55-65%). Nuclear stress EF: 63%. There was no ST segment deviation noted during stress. This is a low risk study.   There is a small, moderate, reversible defect in the inferoapex.  Wall motion is normal in this region.  There is significant extracardiac bowel tracer uptake adjacent to the inferoapex with stress on both supine and upright stress images.  This finding likely represents attenuation artifact from extracardiac tracer uptake, but cannot exclude a small area of ischemia.   Overall study is low risk.   Echo 05/03/2020: IMPRESSIONS    1. Left ventricular ejection fraction, by estimation, is 55%. The left  ventricle has normal function. The left ventricle has no regional wall  motion abnormalities. There is mild left ventricular hypertrophy. Left  ventricular diastolic parameters are  consistent with Grade I diastolic dysfunction (impaired relaxation).   2. Right ventricular systolic function is normal. The right ventricular  size is normal. There is normal pulmonary artery systolic pressure. The  estimated right ventricular systolic pressure is 16.1 mmHg.   3. Left atrial size was mild to moderately dilated.   4. Right atrial size was mildly dilated.   5. The mitral valve is normal in structure. Mild mitral valve  regurgitation. No evidence of mitral stenosis.   6. Tricuspid valve regurgitation is mild to moderate.   7. Bioprosthetic aortic valve replacement.  Mean gradient 18 mmHg. No  significant regurgitation.   8. The inferior vena cava is normal in size with greater than 50%  respiratory  variability, suggesting right atrial pressure of 3 mmHg.  VAS [place] LE (DVT): Summary:  Right: No evidence of deep vein thrombosis in the lower extremity. No  indirect evidence of obstruction proximal to the inguinal ligament.  Left: No evidence of common femoral vein obstruction.   Echo 11/01/14: Study Conclusions  - Left ventricle: The cavity size was normal. Wall thickness was   increased in a pattern of moderate LVH. Systolic function was   normal. The estimated ejection fraction was in the range of 55%   to 60%. Wall motion was normal; there were no regional wall   motion abnormalities. - Aortic valve: Normal appearing tissue AVR with no peri valvular   regurgitation. - Mitral valve: There was mild regurgitation. - Left atrium: The atrium was mildly dilated. - Atrial septum: No defect or patent foramen ovale was identified.  ASSESSMENT AND PLAN:  Bilateral carotid artery disease (HCC) Mild right stenosis on recent imaging.  Lipids are well controlled on atorvastatin.  His dose was increased to 80 mg but that is not necessary.  He was controlled on 40 mg.  Continue aspirin.  Coronary artery disease involving native heart with unstable angina pectoris He is stable and doing well.  He has no ischemia.  Continue aspirin, atorvastatin, and carvedilol.  Essential hypertension, benign Blood pressures remain labile.  He is tolerating his current doses of carvedilol, lisinopril, and spironolactone.  He does have orthostatic hypotension and has had some syncope and falls.  Therefore he has some permissive hypertension and we are targeting a blood pressure around 140/80-90.  Spironolactone was recently increased and he is stable.  If he does start to develop hypotension we will reduce it back to 12.5 mg daily.  Pure hypercholesterolemia Reduce atorvastatin back to 40 mg as above.  S/P aortic valve replacement with bioprosthetic valve Prostatic aortic valve stable on recent echo.  He is  euvolemic.  He has upcoming surgical procedures.  He will take amoxicillin prophylaxis 2 g 30 minutes to an hour before the procedure.  Current medicines are reviewed at length with the patient today.  The patient does not have concerns regarding medicines.  The following changes have been made:  none  Labs/ tests ordered today include:   No orders of the defined types were placed in this encounter.   Disposition:   FU with Abeer Iversen C. Oval Linsey, MD in 6 months.    I,Jessica Ford,acting as a Education administrator for [place], MD.,have documented all relevant documentation on the behalf of [name], MD,as directed by  [name], MD while in the presence of [name], MD.   I, [place] Oval Linsey, MD have reviewed all documentation for this visit.  The documentation of the exam, diagnosis, procedures, and orders on 11/01/2021 are all accurate and complete.  Signed, [name], MD  11/01/2021 11:18 AM    [place]

## 2021-11-01 NOTE — Assessment & Plan Note (Signed)
He is stable and doing well.  He has no ischemia.  Continue aspirin, atorvastatin, and carvedilol.

## 2021-11-02 DIAGNOSIS — Z8546 Personal history of malignant neoplasm of prostate: Secondary | ICD-10-CM | POA: Diagnosis not present

## 2021-11-02 DIAGNOSIS — N3000 Acute cystitis without hematuria: Secondary | ICD-10-CM | POA: Diagnosis not present

## 2021-11-05 DIAGNOSIS — H40021 Open angle with borderline findings, high risk, right eye: Secondary | ICD-10-CM | POA: Diagnosis not present

## 2021-11-05 DIAGNOSIS — Z947 Corneal transplant status: Secondary | ICD-10-CM | POA: Diagnosis not present

## 2021-11-05 DIAGNOSIS — H04122 Dry eye syndrome of left lacrimal gland: Secondary | ICD-10-CM | POA: Diagnosis not present

## 2021-11-05 DIAGNOSIS — Z961 Presence of intraocular lens: Secondary | ICD-10-CM | POA: Diagnosis not present

## 2021-11-05 DIAGNOSIS — H401123 Primary open-angle glaucoma, left eye, severe stage: Secondary | ICD-10-CM | POA: Diagnosis not present

## 2021-11-10 DIAGNOSIS — Z23 Encounter for immunization: Secondary | ICD-10-CM | POA: Diagnosis not present

## 2021-11-13 DIAGNOSIS — L989 Disorder of the skin and subcutaneous tissue, unspecified: Secondary | ICD-10-CM | POA: Diagnosis not present

## 2021-11-13 DIAGNOSIS — C44529 Squamous cell carcinoma of skin of other part of trunk: Secondary | ICD-10-CM | POA: Diagnosis not present

## 2021-11-27 DIAGNOSIS — L905 Scar conditions and fibrosis of skin: Secondary | ICD-10-CM | POA: Diagnosis not present

## 2021-11-27 DIAGNOSIS — Z4802 Encounter for removal of sutures: Secondary | ICD-10-CM | POA: Diagnosis not present

## 2021-11-27 DIAGNOSIS — C44629 Squamous cell carcinoma of skin of left upper limb, including shoulder: Secondary | ICD-10-CM | POA: Diagnosis not present

## 2021-11-27 DIAGNOSIS — Z0100 Encounter for examination of eyes and vision without abnormal findings: Secondary | ICD-10-CM | POA: Diagnosis not present

## 2021-12-10 DIAGNOSIS — C44329 Squamous cell carcinoma of skin of other parts of face: Secondary | ICD-10-CM | POA: Diagnosis not present

## 2021-12-10 DIAGNOSIS — L821 Other seborrheic keratosis: Secondary | ICD-10-CM | POA: Diagnosis not present

## 2022-01-08 DIAGNOSIS — I2510 Atherosclerotic heart disease of native coronary artery without angina pectoris: Secondary | ICD-10-CM | POA: Diagnosis not present

## 2022-01-08 DIAGNOSIS — I1310 Hypertensive heart and chronic kidney disease without heart failure, with stage 1 through stage 4 chronic kidney disease, or unspecified chronic kidney disease: Secondary | ICD-10-CM | POA: Diagnosis not present

## 2022-01-08 DIAGNOSIS — N529 Male erectile dysfunction, unspecified: Secondary | ICD-10-CM | POA: Diagnosis not present

## 2022-01-08 DIAGNOSIS — E7800 Pure hypercholesterolemia, unspecified: Secondary | ICD-10-CM | POA: Diagnosis not present

## 2022-01-08 DIAGNOSIS — N182 Chronic kidney disease, stage 2 (mild): Secondary | ICD-10-CM | POA: Diagnosis not present

## 2022-01-08 DIAGNOSIS — E0500 Thyrotoxicosis with diffuse goiter without thyrotoxic crisis or storm: Secondary | ICD-10-CM | POA: Diagnosis not present

## 2022-01-08 DIAGNOSIS — I700 Atherosclerosis of aorta: Secondary | ICD-10-CM | POA: Diagnosis not present

## 2022-01-28 DIAGNOSIS — L578 Other skin changes due to chronic exposure to nonionizing radiation: Secondary | ICD-10-CM | POA: Diagnosis not present

## 2022-01-28 DIAGNOSIS — L570 Actinic keratosis: Secondary | ICD-10-CM | POA: Diagnosis not present

## 2022-01-28 DIAGNOSIS — L905 Scar conditions and fibrosis of skin: Secondary | ICD-10-CM | POA: Diagnosis not present

## 2022-01-31 ENCOUNTER — Encounter (HOSPITAL_BASED_OUTPATIENT_CLINIC_OR_DEPARTMENT_OTHER): Payer: Self-pay

## 2022-02-11 ENCOUNTER — Other Ambulatory Visit (HOSPITAL_COMMUNITY): Payer: Self-pay

## 2022-02-21 ENCOUNTER — Ambulatory Visit: Payer: Medicare HMO | Admitting: Internal Medicine

## 2022-02-25 ENCOUNTER — Other Ambulatory Visit (HOSPITAL_COMMUNITY): Payer: Self-pay

## 2022-02-26 ENCOUNTER — Other Ambulatory Visit (HOSPITAL_COMMUNITY): Payer: Self-pay

## 2022-02-26 ENCOUNTER — Other Ambulatory Visit: Payer: Self-pay

## 2022-02-26 MED ORDER — ACYCLOVIR 400 MG PO TABS
400.0000 mg | ORAL_TABLET | Freq: Every day | ORAL | 3 refills | Status: DC
  Filled 2022-02-26: qty 90, 90d supply, fill #0
  Filled 2022-07-14: qty 90, 90d supply, fill #1
  Filled 2022-10-08: qty 90, 90d supply, fill #2
  Filled 2023-01-20: qty 90, 90d supply, fill #3

## 2022-02-26 MED ORDER — PREDNISOLONE ACETATE 1 % OP SUSP
1.0000 [drp] | Freq: Two times a day (BID) | OPHTHALMIC | 5 refills | Status: DC
  Filled 2022-02-26: qty 15, 75d supply, fill #0

## 2022-02-26 MED ORDER — TIMOLOL MALEATE 0.5 % OP SOLN
1.0000 [drp] | Freq: Two times a day (BID) | OPHTHALMIC | 3 refills | Status: DC
  Filled 2022-02-26: qty 10, 100d supply, fill #0
  Filled 2023-02-25: qty 10, 100d supply, fill #1

## 2022-02-27 ENCOUNTER — Telehealth (HOSPITAL_BASED_OUTPATIENT_CLINIC_OR_DEPARTMENT_OTHER): Payer: Self-pay | Admitting: Cardiovascular Disease

## 2022-02-27 ENCOUNTER — Other Ambulatory Visit (HOSPITAL_COMMUNITY): Payer: Self-pay

## 2022-02-27 ENCOUNTER — Other Ambulatory Visit: Payer: Self-pay

## 2022-02-27 MED ORDER — CARVEDILOL 6.25 MG PO TABS
6.2500 mg | ORAL_TABLET | Freq: Two times a day (BID) | ORAL | 1 refills | Status: DC
  Filled 2022-02-27: qty 180, 90d supply, fill #0
  Filled 2022-07-14: qty 180, 90d supply, fill #1

## 2022-02-27 MED ORDER — LISINOPRIL 20 MG PO TABS
20.0000 mg | ORAL_TABLET | Freq: Two times a day (BID) | ORAL | 1 refills | Status: DC
  Filled 2022-02-27: qty 180, 90d supply, fill #0
  Filled 2022-09-03: qty 180, 90d supply, fill #1

## 2022-02-27 MED ORDER — ASPIRIN 81 MG PO TBEC
81.0000 mg | DELAYED_RELEASE_TABLET | Freq: Every day | ORAL | 3 refills | Status: DC
  Filled 2022-02-27: qty 90, 90d supply, fill #0

## 2022-02-27 MED ORDER — ATORVASTATIN CALCIUM 80 MG PO TABS
40.0000 mg | ORAL_TABLET | Freq: Every day | ORAL | 3 refills | Status: DC
  Filled 2022-02-27: qty 45, 90d supply, fill #0

## 2022-02-27 MED ORDER — SPIRONOLACTONE 25 MG PO TABS
25.0000 mg | ORAL_TABLET | Freq: Every day | ORAL | 3 refills | Status: DC
  Filled 2022-02-27: qty 90, 90d supply, fill #0
  Filled 2022-07-14: qty 90, 90d supply, fill #1
  Filled 2022-10-08: qty 90, 90d supply, fill #2
  Filled 2023-01-20: qty 90, 90d supply, fill #3

## 2022-02-27 NOTE — Telephone Encounter (Signed)
Medications prescribed by Cardiology transferred to [facility] per patient request.  Patient's wife aware.  Georgana Curio MHA RN CCM

## 2022-02-27 NOTE — Telephone Encounter (Signed)
New Message:    Wife called and said patient insurance changed. She says she needs all of his medicine sent to [facility] please.

## 2022-02-28 ENCOUNTER — Other Ambulatory Visit (HOSPITAL_COMMUNITY): Payer: Self-pay

## 2022-03-01 ENCOUNTER — Other Ambulatory Visit (HOSPITAL_COMMUNITY): Payer: Self-pay

## 2022-03-04 ENCOUNTER — Other Ambulatory Visit (HOSPITAL_COMMUNITY): Payer: Self-pay

## 2022-03-04 ENCOUNTER — Other Ambulatory Visit: Payer: Self-pay

## 2022-03-04 MED ORDER — OXYBUTYNIN CHLORIDE 5 MG PO TABS
5.0000 mg | ORAL_TABLET | Freq: Two times a day (BID) | ORAL | 3 refills | Status: DC
  Filled 2022-03-04: qty 180, 90d supply, fill #0
  Filled 2022-06-15: qty 180, 90d supply, fill #1
  Filled 2022-09-03: qty 180, 90d supply, fill #2
  Filled 2022-12-20: qty 180, 90d supply, fill #3

## 2022-03-18 DIAGNOSIS — H04122 Dry eye syndrome of left lacrimal gland: Secondary | ICD-10-CM | POA: Diagnosis not present

## 2022-03-18 DIAGNOSIS — Z947 Corneal transplant status: Secondary | ICD-10-CM | POA: Diagnosis not present

## 2022-03-18 DIAGNOSIS — Z961 Presence of intraocular lens: Secondary | ICD-10-CM | POA: Diagnosis not present

## 2022-03-18 DIAGNOSIS — H401123 Primary open-angle glaucoma, left eye, severe stage: Secondary | ICD-10-CM | POA: Diagnosis not present

## 2022-03-18 DIAGNOSIS — H40021 Open angle with borderline findings, high risk, right eye: Secondary | ICD-10-CM | POA: Diagnosis not present

## 2022-04-22 DIAGNOSIS — Z789 Other specified health status: Secondary | ICD-10-CM | POA: Diagnosis not present

## 2022-04-22 DIAGNOSIS — L820 Inflamed seborrheic keratosis: Secondary | ICD-10-CM | POA: Diagnosis not present

## 2022-04-22 DIAGNOSIS — L538 Other specified erythematous conditions: Secondary | ICD-10-CM | POA: Diagnosis not present

## 2022-04-22 DIAGNOSIS — D1801 Hemangioma of skin and subcutaneous tissue: Secondary | ICD-10-CM | POA: Diagnosis not present

## 2022-04-22 DIAGNOSIS — D485 Neoplasm of uncertain behavior of skin: Secondary | ICD-10-CM | POA: Diagnosis not present

## 2022-04-22 DIAGNOSIS — L298 Other pruritus: Secondary | ICD-10-CM | POA: Diagnosis not present

## 2022-04-22 DIAGNOSIS — L989 Disorder of the skin and subcutaneous tissue, unspecified: Secondary | ICD-10-CM | POA: Diagnosis not present

## 2022-04-22 DIAGNOSIS — L821 Other seborrheic keratosis: Secondary | ICD-10-CM | POA: Diagnosis not present

## 2022-04-22 DIAGNOSIS — Z08 Encounter for follow-up examination after completed treatment for malignant neoplasm: Secondary | ICD-10-CM | POA: Diagnosis not present

## 2022-04-22 DIAGNOSIS — Z85828 Personal history of other malignant neoplasm of skin: Secondary | ICD-10-CM | POA: Diagnosis not present

## 2022-04-22 DIAGNOSIS — L814 Other melanin hyperpigmentation: Secondary | ICD-10-CM | POA: Diagnosis not present

## 2022-05-08 DIAGNOSIS — C44612 Basal cell carcinoma of skin of right upper limb, including shoulder: Secondary | ICD-10-CM | POA: Diagnosis not present

## 2022-05-08 DIAGNOSIS — L989 Disorder of the skin and subcutaneous tissue, unspecified: Secondary | ICD-10-CM | POA: Diagnosis not present

## 2022-05-13 DIAGNOSIS — H04122 Dry eye syndrome of left lacrimal gland: Secondary | ICD-10-CM | POA: Diagnosis not present

## 2022-05-13 DIAGNOSIS — H40021 Open angle with borderline findings, high risk, right eye: Secondary | ICD-10-CM | POA: Diagnosis not present

## 2022-05-13 DIAGNOSIS — H401123 Primary open-angle glaucoma, left eye, severe stage: Secondary | ICD-10-CM | POA: Diagnosis not present

## 2022-05-13 DIAGNOSIS — Z947 Corneal transplant status: Secondary | ICD-10-CM | POA: Diagnosis not present

## 2022-05-13 DIAGNOSIS — Z961 Presence of intraocular lens: Secondary | ICD-10-CM | POA: Diagnosis not present

## 2022-05-14 ENCOUNTER — Encounter (HOSPITAL_BASED_OUTPATIENT_CLINIC_OR_DEPARTMENT_OTHER): Payer: Self-pay | Admitting: Cardiovascular Disease

## 2022-05-14 ENCOUNTER — Ambulatory Visit (INDEPENDENT_AMBULATORY_CARE_PROVIDER_SITE_OTHER): Payer: HMO | Admitting: Cardiovascular Disease

## 2022-05-14 VITALS — BP 124/82 | HR 61 | Ht 73.0 in | Wt 170.8 lb

## 2022-05-14 DIAGNOSIS — I10 Essential (primary) hypertension: Secondary | ICD-10-CM

## 2022-05-14 DIAGNOSIS — R55 Syncope and collapse: Secondary | ICD-10-CM

## 2022-05-14 DIAGNOSIS — I25110 Atherosclerotic heart disease of native coronary artery with unstable angina pectoris: Secondary | ICD-10-CM

## 2022-05-14 DIAGNOSIS — Z953 Presence of xenogenic heart valve: Secondary | ICD-10-CM | POA: Diagnosis not present

## 2022-05-14 NOTE — Assessment & Plan Note (Signed)
Blood pressure is very well-controlled.  He brings a log of his pressures from home showing that they are averaging in the 110s over 70s.  He is no longer having any orthostatic symptoms.  Will not make any changes to his carvedilol, lisinopril, and spironolactone.

## 2022-05-14 NOTE — Assessment & Plan Note (Signed)
He has no angina.  Lipids are very well-controlled.  Encouraged him to work on increasing his exercise to at least 150 minutes weekly.  Continue aspirin, carvedilol, and atorvastatin.

## 2022-05-14 NOTE — Progress Notes (Signed)
Cardiology office note   Date:  05/14/2022   ID:  [name], [name] 1940-05-19, MRN [id number]   PCP:  Lupita Raider, MD  Cardiologist:  Chilton Si, MD  Electrophysiologist:  None   Evaluation Performed:  Follow-Up Visit  Chief Complaint:  Hypertension, edema  History of Present Illness:     [name] [name] is a 82 y.o. male with hypertension, CAD s/p CABG (LIMA-->LAD, SVG-->RI/OM1, SVG-->PDA), aortic stenosis s/p AVR, bradycardia, and syncope who presents for follow up.  [name] [name] underwent 4 vessel CABG and AVR (25 mm [facility] Ease pericardial tissue valve) on 09/28/14.  His post-operative course was complicated by atrial fibrillation/flutter, but he was discharged in sinus rhythm.  He had an episode of lightheadedness in 08/2015 and was seen in the ED.  He was noted to have acute renal failure and was felt to be dehydrated. He had been working in the yard for several hours the day before and was not well hydrated.  Metoprolol was switched to carvedilol due to bradycardia and hypertension.  He was also started on amlodipine, so simvastatin was switched to atorvastatin.   [name] [name] has some right lower extremity edema that is chronic.  He had lower extremity Dopplers 03/2018 that were negative for DVT.  His PCP recommended a compression stocking which does seem to help when he wears it. His blood pressure was elevated so amlodipine was increased.  However, [name] [name] had increased lower extremity edema.  Therefore his dose was reduced and he was started on spironolactone.  Since then his BP has been much better.  He had an episode of syncope in the setting of hypotension, working outside and poor oral intake.  Amlodipine was discontinued.  He followed up with Joni Reining, DNP 02/2020 and was not orthostatic in the office though he did continue to report positional lightheadedness. He was referred for an echo 04/2020 that revealed LVEF 55%.  He had grade 1 diastolic dysfunction  and his bioprosthetic aortic valve was functioning well.  The mean gradient was 18 mmHg.  He also had a nuclear stress test with LVEF 63% and no ischemia.  Spironolactone was reduced due to labile blood pressures  He follow-up with [name] [name] in the next month and was stable but continued to report fatigue. He had his knee replaced 02/2021. His saw his PCP 08/2021 and reported an episode of syncope in July after being stung by hornets. He had an echocardiogram LVEF 60-65 and mod LDH His aortic valve gradient 15 mmHg. Carotid dopplers showed mild stenosis on the right. His spironolactone was increased from 12.5 mg to 25 mg when he saw [name] Dory NP on 10/15/2021. His atorvastatin was also increased from 40 mg to 80 mg.   Last visit 10/2021 He had been experiencing some fatigue and dizziness when bending over. Latley he has been well and has no symptoms.  He isn't exercising much due to being busy. He has been stressed due to life events such as his wife's surgery. His breathing complications and dizziness haven't resurfaced. He denies any palpitations, chest pain, or peripheral edema. No lightheadedness, headaches, syncope, orthopnea, or PND.   Past Medical History:  Diagnosis Date   A-fib    Arthritis    knee   Cataracts, bilateral    Chronic kidney disease    stage 2   Coronary artery disease    Fatigue 04/05/2020   GERD (gastroesophageal reflux disease)    Glaucoma    Hearing loss  wears hearing aids   History of kidney stones    passed stones and 1 time had surgery   Hyperlipidemia    Hypertension    Inguinal hernia 03/2019   Orthostatic hypotension 11/01/2021   Prostate cancer    Pure hypercholesterolemia 03/23/2019   S/P aortic valve replacement with bioprosthetic valve 03/23/2019   Bioprosthetic 09/2014   Shingles    Varicose veins 03/31/2015   Wears partial dentures    upper    Past Surgical History:  Procedure Laterality Date   AORTIC VALVE REPLACEMENT N/A 09/28/2014    Procedure: AORTIC VALVE REPLACEMENT (AVR);  Surgeon: Loreli Slot, MD;  Location[facility] OR;  Service: Open Heart Surgery;  Laterality: N/A;   CARDIAC CATHETERIZATION N/A 09/23/2014   Procedure: Left Heart Cath and Coronary Angiography;  Surgeon: Marykay Lex, MD;  Location: [facility] INVASIVE CV LAB;  Service: Cardiovascular;  Laterality: N/A;   CATARACT EXTRACTION     CHOLECYSTECTOMY N/A 10/28/2016   Procedure: LAPAROSCOPIC CHOLECYSTECTOMY;  Surgeon: Axel Filler, MD;  Location: [facility] OR;  Service: General;  Laterality: N/A;   COLONOSCOPY     CORONARY ARTERY BYPASS GRAFT N/A 09/28/2014   Procedure: CORONARY ARTERY BYPASS GRAFTING (CABG) x4 using left internal mammory artery and right saphenous leg vein harvested endoscopically.;  Surgeon: Loreli Slot, MD;  Location: [facility] OR;  Service: Open Heart Surgery;  Laterality: N/A;   INGUINAL HERNIA REPAIR Right 04/12/2019   Procedure: LAPAROSCOPIC RIGHT INGUINAL HERNIA REPAIR WITH MESH;  Surgeon: Axel Filler, MD;  Location: [facility] OR;  Service: General;  Laterality: Right;   kidney stone removal     PROSTATECTOMY     TEE WITHOUT CARDIOVERSION N/A 09/28/2014   Procedure: TRANSESOPHAGEAL ECHOCARDIOGRAM (TEE);  Surgeon: Loreli Slot, MD;  Location: [facility] OR;  Service: Open Heart Surgery;  Laterality: N/A;   TONSILLECTOMY     TOTAL KNEE ARTHROPLASTY Left 03/12/2021   Procedure: TOTAL KNEE ARTHROPLASTY;  Surgeon: Ollen Gross, MD;  Location: [facility] ORS;  Service: Orthopedics;  Laterality: Left;   WISDOM TOOTH EXTRACTION       Current Outpatient Medications  Medication Sig Dispense Refill   acetaminophen (TYLENOL) 500 MG tablet Take 1,000 mg by mouth every 6 (six) hours as needed for headache (pain).      acyclovir (ZOVIRAX) 400 MG tablet Take 400 mg by mouth in the morning.     acyclovir (ZOVIRAX) 400 MG tablet Take 1 tablet (400 mg total) by mouth daily. 90 tablet 3   amoxicillin (AMOXIL) 500 MG tablet TAKE 4 TABLETS 1 HOUR PRIOR TO PROCEDURES  12 tablet 1   aspirin EC 81 MG tablet Take 1 tablet (81 mg total) by mouth daily. Swallow whole. 90 tablet 3   atorvastatin (LIPITOR) 80 MG tablet Take 0.5 tablets (40 mg total) by mouth daily. 90 tablet 3   carvedilol (COREG) 6.25 MG tablet Take 1 tablet (6.25 mg total) by mouth 2 (two) times daily. 180 tablet 1   esomeprazole (NEXIUM) 20 MG capsule Take 20 mg by mouth every other day. Reported on 03/01/2015     lisinopril (ZESTRIL) 20 MG tablet Take 1 tablet (20 mg total) by mouth 2 (two) times daily. 180 tablet 1   meloxicam (MOBIC) 15 MG tablet Take 15 mg by mouth daily.     oxybutynin (DITROPAN) 5 MG tablet Take 1 tablet (5 mg total) by mouth 2 (two) times daily. 180 tablet 3   prednisoLONE acetate (PRED FORTE) 1 % ophthalmic suspension Place 1 drop into the  left eye in the morning and at bedtime.     spironolactone (ALDACTONE) 25 MG tablet Take 1 tablet (25 mg total) by mouth daily. Please schedule appointment for refills. 90 tablet 3   timolol (TIMOPTIC) 0.5 % ophthalmic solution Place 1 drop into the left eye 2 (two) times daily.     No current facility-administered medications for this visit.    Allergies:   Hydrochlorothiazide    Social History:  The patient  reports that he quit smoking about 42 years ago. His smoking use included cigarettes. He has never used smokeless tobacco. He reports current alcohol use. He reports that he does not use drugs.   Family History:  The patient's family history includes Cancer in his mother; Ovarian cancer in his mother.    ROS:   Please see the history of present illness.   All other systems are reviewed and negative.    PHYSICAL EXAM: VS:  BP 124/82 (BP Location: Left Arm, Patient Position: Sitting, Cuff Size: Normal)   Pulse 61   Ht  (1.854 m)   Wt 170 lb 12.8 oz (77.5 kg)   BMI 22.53 kg/m  , BMI Body mass index is 22.53 kg/m. GENERAL:  Well appearing HEENT: Pupils equal round and reactive, fundi not visualized, oral mucosa  unremarkable NECK:  No jugular venous distention, waveform within normal limits, carotid upstroke brisk and symmetric, no bruits, no thyromegaly LUNGS:  Clear to auscultation bilaterally HEART:  RRR.  PMI not displaced or sustained,S1 and S2 within normal limits, no S3, no S4, no clicks, no rubs, II/VI systolic murmur at the LUSB ABD:  Flat, positive bowel sounds normal in frequency in pitch, no bruits, no rebound, no guarding, no midline pulsatile mass, no hepatomegaly, no splenomegaly EXT:  2 plus pulses throughout, no edema, no cyanosis no clubbing SKIN:  No rashes no nodules NEURO:  Cranial nerves II through XII grossly intact, motor grossly intact throughout PSYCH:  Cognitively intact, oriented to person place and time  Recent Labs: 09/19/2021: Hemoglobin 13.7; Magnesium 2.1; Platelets 190; TSH 3.800 10/04/2021: BUN 21; Creatinine, Ser 1.16; Potassium 4.9; Sodium 142   11/25/2017: Total cholesterol 114, triglycerides 91, HDL 40, LDL 55 Potassium 4.9, creatinine 0.9  Lipid Panel No results found for: "CHOL", "TRIG", "HDL", "CHOLHDL", "VLDL", "LDLCALC", "LDLDIRECT"    Wt Readings from Last 3 Encounters:  05/14/22 170 lb 12.8 oz (77.5 kg)  11/01/21 166 lb 12.8 oz (75.7 kg)  10/15/21 173 lb (78.5 kg)     EKG : EKG is personally reviewed.  05/14/2022: Sinus Rhythm, 61 bpm, 1st degree block, LAFB 11/01/2021: EKG was not ordered.  03/21/17: Sinus rhythm.  Rate 60 bpm.  Left axis deviation.  03/23/18: Sinus bradycardia.  Rate 54 bpm.  First degree AV block.  LAFB.   03/23/19: Sinus rhythm.  Rate 60 bpm.  Left axis deviation.  Prior anteroseptal infarct.  IVCD. 11/27/2020: Sinus rhythm.  Rate 65 bpm.  LAFB.  IVCD.  Cannot rule out prior anteroseptal infarct.   Bilateral Carotid Doppler 10/05/2021: Summary:  Right Carotid: The extracranial vessels were near-normal with only minimal wall thickening or plaque.   Left Carotid: Velocities in the left ICA are consistent with a 1-39%  stenosis.    Vertebrals:  Bilateral vertebral arteries demonstrate antegrade flow.  Subclavians: Normal flow hemodynamics were seen in bilateral subclavian arteries.   ECHO 10/04/2021: IMPRESSIONS    1. Left ventricular ejection fraction, by estimation, is 60 to 65%. The  left ventricle has normal  function. The left ventricle has no regional  wall motion abnormalities. There is moderate asymmetric left ventricular  hypertrophy of the basal-septal  segment. Left ventricular diastolic parameters are consistent with Grade  II diastolic dysfunction (pseudonormalization).   2. Right ventricular systolic function is normal. The right ventricular  size is mildly enlarged. There is normal pulmonary artery systolic  pressure.   3. Left atrial size was mildly dilated.   4. The mitral valve is grossly normal. Mild mitral valve regurgitation.  No evidence of mitral stenosis.   5. The aortic valve has been replaced with a 25 mm Magna Ease  Bioprosthetic valve. Aortic valve regurgitation is not visualized (small  diastolic flow in PLAX view is likely coronary). Effectiveorifice area, by  VTI measures 2.11 cm. Aortic valve mean  gradient measures 14.0 mmHg. Aortic valve acceleration time measures 85  msec. Normal prosthetic function.   6. The inferior vena cava is normal in size with greater than 50%  respiratory variability, suggesting right atrial pressure of 3 mmHg.   Stress Test with Lexiscan 05/03/2020: The left ventricular ejection fraction is normal (55-65%). Nuclear stress EF: 63%. There was no ST segment deviation noted during stress. This is a low risk study.   There is a small, moderate, reversible defect in the inferoapex.  Wall motion is normal in this region.  There is significant extracardiac bowel tracer uptake adjacent to the inferoapex with stress on both supine and upright stress images.  This finding likely represents attenuation artifact from extracardiac tracer uptake, but cannot  exclude a small area of ischemia.   Overall study is low risk.   Echo 05/03/2020: IMPRESSIONS    1. Left ventricular ejection fraction, by estimation, is 55%. The left  ventricle has normal function. The left ventricle has no regional wall  motion abnormalities. There is mild left ventricular hypertrophy. Left  ventricular diastolic parameters are  consistent with Grade I diastolic dysfunction (impaired relaxation).   2. Right ventricular systolic function is normal. The right ventricular  size is normal. There is normal pulmonary artery systolic pressure. The  estimated right ventricular systolic pressure is 29.8 mmHg.   3. Left atrial size was mild to moderately dilated.   4. Right atrial size was mildly dilated.   5. The mitral valve is normal in structure. Mild mitral valve  regurgitation. No evidence of mitral stenosis.   6. Tricuspid valve regurgitation is mild to moderate.   7. Bioprosthetic aortic valve replacement. Mean gradient 18 mmHg. No  significant regurgitation.   8. The inferior vena cava is normal in size with greater than 50%  respiratory variability, suggesting right atrial pressure of 3 mmHg.  VAS [place] LE (DVT): Summary:  Right: No evidence of deep vein thrombosis in the lower extremity. No  indirect evidence of obstruction proximal to the inguinal ligament.  Left: No evidence of common femoral vein obstruction.   Echo 11/01/14: Study Conclusions  - Left ventricle: The cavity size was normal. Wall thickness was   increased in a pattern of moderate LVH. Systolic function was   normal. The estimated ejection fraction was in the range of 55%   to 60%. Wall motion was normal; there were no regional wall   motion abnormalities. - Aortic valve: Normal appearing tissue AVR with no peri valvular   regurgitation. - Mitral valve: There was mild regurgitation. - Left atrium: The atrium was mildly dilated. - Atrial septum: No defect or patent foramen ovale was  identified.  ASSESSMENT AND PLAN:  Essential hypertension, benign Blood pressure is very well-controlled.  He brings a log of his pressures from home showing that they are averaging in the 110s over 70s.  He is no longer having any orthostatic symptoms.  Will not make any changes to his carvedilol, lisinopril, and spironolactone.  Coronary artery disease involving native heart with unstable angina pectoris He has no angina.  Lipids are very well-controlled.  Encouraged him to work on increasing his exercise to at least 150 minutes weekly.  Continue aspirin, carvedilol, and atorvastatin.  S/P aortic valve replacement with bioprosthetic valve Status post bioprosthetic AVR.  Mean aortic valve gradient was 14 mmHg on his echo 12/2021.  Syncope No recurrent episodes.  Continue current blood pressure regimen.  Encouraged him to stay hydrated in the summer months.   Current medicines are reviewed at length with the patient today.  The patient does not have concerns regarding medicines.  The following changes have been made:  none  Labs/ tests ordered today include:   No orders of the defined types were placed in this encounter.   Disposition:   FU with Yarelly Kuba C. Duke Salvia, MD in 1 year. 6 months with Gillian Shields.  I,Coren O'Brien,acting as a Neurosurgeon for [employer], MD.,have documented all relevant documentation on the behalf of Chilton Si, MD,as directed by  Chilton Si, MD while in the presence of Chilton Si, MD.  I, Ailene Royal C. Duke Salvia, MD have reviewed all documentation for this visit.  The documentation of the exam, diagnosis, procedures, and orders on 05/14/2022 are all accurate and complete.

## 2022-05-14 NOTE — Assessment & Plan Note (Signed)
No recurrent episodes.  Continue current blood pressure regimen.  Encouraged him to stay hydrated in the summer months.

## 2022-05-14 NOTE — Assessment & Plan Note (Signed)
Status post bioprosthetic AVR.  Mean aortic valve gradient was 14 mmHg on his echo 12/2021.

## 2022-05-14 NOTE — Patient Instructions (Signed)
Medication Instructions:  Your physician recommends that you continue on your current medications as directed. Please refer to the Current Medication list given to you today.   *If you need a refill on your cardiac medications before your next appointment, please call your pharmacy*  Lab Work: NONE  Testing/Procedures: NONE  Follow-Up: At [facility], you and your health needs are our priority.  As part of our continuing mission to provide you with exceptional heart care, we have created designated Provider Care Teams.  These Care Teams include your primary Cardiologist (physician) and Advanced Practice Providers (APPs -  Physician Assistants and Nurse Practitioners) who all work together to provide you with the care you need, when you need it.  We recommend signing up for the patient portal called "MyChart".  Sign up information is provided on this After Visit Summary.  MyChart is used to connect with patients for Virtual Visits (Telemedicine).  Patients are able to view lab/test results, encounter notes, upcoming appointments, etc.  Non-urgent messages can be sent to your provider as well.   To learn more about what you can do with MyChart, go to [email or web address].    Your next appointment:   6 month(s)  Provider:   Gillian Shields, NP    1 YEAR WITH DR [facility]  Other Instructions INCREASE YOUR EXERCISE

## 2022-05-28 ENCOUNTER — Other Ambulatory Visit: Payer: Self-pay

## 2022-05-28 ENCOUNTER — Other Ambulatory Visit (HOSPITAL_COMMUNITY): Payer: Self-pay

## 2022-05-28 MED ORDER — MELOXICAM 15 MG PO TABS
15.0000 mg | ORAL_TABLET | Freq: Every day | ORAL | 0 refills | Status: DC
  Filled 2022-05-28: qty 90, 90d supply, fill #0

## 2022-06-15 ENCOUNTER — Other Ambulatory Visit (HOSPITAL_COMMUNITY): Payer: Self-pay

## 2022-07-15 ENCOUNTER — Other Ambulatory Visit (HOSPITAL_COMMUNITY): Payer: Self-pay

## 2022-07-15 ENCOUNTER — Other Ambulatory Visit: Payer: Self-pay

## 2022-08-06 DIAGNOSIS — Z0000 Encounter for general adult medical examination without abnormal findings: Secondary | ICD-10-CM | POA: Diagnosis not present

## 2022-08-06 DIAGNOSIS — K219 Gastro-esophageal reflux disease without esophagitis: Secondary | ICD-10-CM | POA: Diagnosis not present

## 2022-08-06 DIAGNOSIS — N182 Chronic kidney disease, stage 2 (mild): Secondary | ICD-10-CM | POA: Diagnosis not present

## 2022-08-06 DIAGNOSIS — I517 Cardiomegaly: Secondary | ICD-10-CM | POA: Diagnosis not present

## 2022-08-06 DIAGNOSIS — Z23 Encounter for immunization: Secondary | ICD-10-CM | POA: Diagnosis not present

## 2022-08-06 DIAGNOSIS — I1310 Hypertensive heart and chronic kidney disease without heart failure, with stage 1 through stage 4 chronic kidney disease, or unspecified chronic kidney disease: Secondary | ICD-10-CM | POA: Diagnosis not present

## 2022-08-06 DIAGNOSIS — I5189 Other ill-defined heart diseases: Secondary | ICD-10-CM | POA: Diagnosis not present

## 2022-08-06 DIAGNOSIS — E7800 Pure hypercholesterolemia, unspecified: Secondary | ICD-10-CM | POA: Diagnosis not present

## 2022-08-06 DIAGNOSIS — I2510 Atherosclerotic heart disease of native coronary artery without angina pectoris: Secondary | ICD-10-CM | POA: Diagnosis not present

## 2022-08-06 DIAGNOSIS — Z952 Presence of prosthetic heart valve: Secondary | ICD-10-CM | POA: Diagnosis not present

## 2022-08-06 DIAGNOSIS — E0500 Thyrotoxicosis with diffuse goiter without thyrotoxic crisis or storm: Secondary | ICD-10-CM | POA: Diagnosis not present

## 2022-08-06 DIAGNOSIS — N529 Male erectile dysfunction, unspecified: Secondary | ICD-10-CM | POA: Diagnosis not present

## 2022-08-09 ENCOUNTER — Encounter (HOSPITAL_BASED_OUTPATIENT_CLINIC_OR_DEPARTMENT_OTHER): Payer: Self-pay

## 2022-09-03 ENCOUNTER — Other Ambulatory Visit (HOSPITAL_COMMUNITY): Payer: Self-pay

## 2022-09-04 ENCOUNTER — Other Ambulatory Visit: Payer: Self-pay

## 2022-09-04 ENCOUNTER — Other Ambulatory Visit (HOSPITAL_COMMUNITY): Payer: Self-pay

## 2022-09-04 DIAGNOSIS — E0500 Thyrotoxicosis with diffuse goiter without thyrotoxic crisis or storm: Secondary | ICD-10-CM | POA: Diagnosis not present

## 2022-09-04 MED ORDER — MELOXICAM 15 MG PO TABS
15.0000 mg | ORAL_TABLET | Freq: Every day | ORAL | 1 refills | Status: DC
  Filled 2022-09-04: qty 90, 90d supply, fill #0
  Filled 2022-12-20: qty 90, 90d supply, fill #1

## 2022-09-05 ENCOUNTER — Other Ambulatory Visit: Payer: Self-pay

## 2022-09-05 ENCOUNTER — Other Ambulatory Visit (HOSPITAL_COMMUNITY): Payer: Self-pay

## 2022-09-05 MED ORDER — METHIMAZOLE 5 MG PO TABS
2.5000 mg | ORAL_TABLET | Freq: Every day | ORAL | 0 refills | Status: DC
  Filled 2022-09-05: qty 50, 100d supply, fill #0

## 2022-09-24 DIAGNOSIS — M25562 Pain in left knee: Secondary | ICD-10-CM | POA: Diagnosis not present

## 2022-09-24 DIAGNOSIS — M25561 Pain in right knee: Secondary | ICD-10-CM | POA: Diagnosis not present

## 2022-09-24 DIAGNOSIS — M1991 Primary osteoarthritis, unspecified site: Secondary | ICD-10-CM | POA: Diagnosis not present

## 2022-09-24 DIAGNOSIS — R262 Difficulty in walking, not elsewhere classified: Secondary | ICD-10-CM | POA: Diagnosis not present

## 2022-09-27 DIAGNOSIS — M25562 Pain in left knee: Secondary | ICD-10-CM | POA: Diagnosis not present

## 2022-09-27 DIAGNOSIS — M1991 Primary osteoarthritis, unspecified site: Secondary | ICD-10-CM | POA: Diagnosis not present

## 2022-09-27 DIAGNOSIS — M25561 Pain in right knee: Secondary | ICD-10-CM | POA: Diagnosis not present

## 2022-09-27 DIAGNOSIS — R262 Difficulty in walking, not elsewhere classified: Secondary | ICD-10-CM | POA: Diagnosis not present

## 2022-09-30 DIAGNOSIS — M25562 Pain in left knee: Secondary | ICD-10-CM | POA: Diagnosis not present

## 2022-09-30 DIAGNOSIS — M1991 Primary osteoarthritis, unspecified site: Secondary | ICD-10-CM | POA: Diagnosis not present

## 2022-09-30 DIAGNOSIS — R262 Difficulty in walking, not elsewhere classified: Secondary | ICD-10-CM | POA: Diagnosis not present

## 2022-09-30 DIAGNOSIS — M25561 Pain in right knee: Secondary | ICD-10-CM | POA: Diagnosis not present

## 2022-10-02 DIAGNOSIS — M25562 Pain in left knee: Secondary | ICD-10-CM | POA: Diagnosis not present

## 2022-10-02 DIAGNOSIS — M1991 Primary osteoarthritis, unspecified site: Secondary | ICD-10-CM | POA: Diagnosis not present

## 2022-10-02 DIAGNOSIS — M25561 Pain in right knee: Secondary | ICD-10-CM | POA: Diagnosis not present

## 2022-10-02 DIAGNOSIS — R262 Difficulty in walking, not elsewhere classified: Secondary | ICD-10-CM | POA: Diagnosis not present

## 2022-10-05 DIAGNOSIS — Z23 Encounter for immunization: Secondary | ICD-10-CM | POA: Diagnosis not present

## 2022-10-07 DIAGNOSIS — M25562 Pain in left knee: Secondary | ICD-10-CM | POA: Diagnosis not present

## 2022-10-07 DIAGNOSIS — R262 Difficulty in walking, not elsewhere classified: Secondary | ICD-10-CM | POA: Diagnosis not present

## 2022-10-07 DIAGNOSIS — M1991 Primary osteoarthritis, unspecified site: Secondary | ICD-10-CM | POA: Diagnosis not present

## 2022-10-07 DIAGNOSIS — M25561 Pain in right knee: Secondary | ICD-10-CM | POA: Diagnosis not present

## 2022-10-08 ENCOUNTER — Other Ambulatory Visit (HOSPITAL_COMMUNITY): Payer: Self-pay

## 2022-10-08 ENCOUNTER — Other Ambulatory Visit (HOSPITAL_BASED_OUTPATIENT_CLINIC_OR_DEPARTMENT_OTHER): Payer: Self-pay | Admitting: *Deleted

## 2022-10-08 ENCOUNTER — Other Ambulatory Visit (HOSPITAL_BASED_OUTPATIENT_CLINIC_OR_DEPARTMENT_OTHER): Payer: Self-pay | Admitting: Cardiovascular Disease

## 2022-10-08 MED ORDER — ATORVASTATIN CALCIUM 40 MG PO TABS
40.0000 mg | ORAL_TABLET | Freq: Every day | ORAL | 3 refills | Status: DC
  Filled 2022-10-08: qty 90, 90d supply, fill #0
  Filled 2023-01-20: qty 90, 90d supply, fill #1
  Filled 2023-04-21: qty 90, 90d supply, fill #2
  Filled 2023-07-30: qty 90, 90d supply, fill #3

## 2022-10-08 MED ORDER — CARVEDILOL 6.25 MG PO TABS
6.2500 mg | ORAL_TABLET | Freq: Two times a day (BID) | ORAL | 1 refills | Status: DC
  Filled 2022-10-08: qty 180, 90d supply, fill #0
  Filled 2023-01-20: qty 180, 90d supply, fill #1

## 2022-10-08 NOTE — Telephone Encounter (Signed)
Patient called in stating taking Lipitor 80 mg 1/2 tablet daily  He asked that 40 mg Rx be called to [facility] pharmacy Sent as requested

## 2022-10-09 ENCOUNTER — Other Ambulatory Visit (HOSPITAL_COMMUNITY): Payer: Self-pay

## 2022-10-09 DIAGNOSIS — E0500 Thyrotoxicosis with diffuse goiter without thyrotoxic crisis or storm: Secondary | ICD-10-CM | POA: Diagnosis not present

## 2022-10-09 DIAGNOSIS — M1991 Primary osteoarthritis, unspecified site: Secondary | ICD-10-CM | POA: Diagnosis not present

## 2022-10-09 DIAGNOSIS — M25562 Pain in left knee: Secondary | ICD-10-CM | POA: Diagnosis not present

## 2022-10-09 DIAGNOSIS — R262 Difficulty in walking, not elsewhere classified: Secondary | ICD-10-CM | POA: Diagnosis not present

## 2022-10-09 DIAGNOSIS — M25561 Pain in right knee: Secondary | ICD-10-CM | POA: Diagnosis not present

## 2022-10-14 DIAGNOSIS — M25561 Pain in right knee: Secondary | ICD-10-CM | POA: Diagnosis not present

## 2022-10-14 DIAGNOSIS — R262 Difficulty in walking, not elsewhere classified: Secondary | ICD-10-CM | POA: Diagnosis not present

## 2022-10-14 DIAGNOSIS — M25562 Pain in left knee: Secondary | ICD-10-CM | POA: Diagnosis not present

## 2022-10-14 DIAGNOSIS — M1991 Primary osteoarthritis, unspecified site: Secondary | ICD-10-CM | POA: Diagnosis not present

## 2022-10-16 DIAGNOSIS — M25562 Pain in left knee: Secondary | ICD-10-CM | POA: Diagnosis not present

## 2022-10-16 DIAGNOSIS — R262 Difficulty in walking, not elsewhere classified: Secondary | ICD-10-CM | POA: Diagnosis not present

## 2022-10-16 DIAGNOSIS — M1991 Primary osteoarthritis, unspecified site: Secondary | ICD-10-CM | POA: Diagnosis not present

## 2022-10-16 DIAGNOSIS — M25561 Pain in right knee: Secondary | ICD-10-CM | POA: Diagnosis not present

## 2022-10-17 ENCOUNTER — Encounter (HOSPITAL_BASED_OUTPATIENT_CLINIC_OR_DEPARTMENT_OTHER): Payer: Self-pay | Admitting: Family

## 2022-10-17 ENCOUNTER — Other Ambulatory Visit: Payer: Self-pay

## 2022-10-17 ENCOUNTER — Ambulatory Visit (HOSPITAL_BASED_OUTPATIENT_CLINIC_OR_DEPARTMENT_OTHER): Payer: HMO | Admitting: Family

## 2022-10-17 VITALS — BP 128/78 | HR 60 | Ht 74.0 in | Wt 172.2 lb

## 2022-10-17 DIAGNOSIS — E785 Hyperlipidemia, unspecified: Secondary | ICD-10-CM

## 2022-10-17 DIAGNOSIS — I25118 Atherosclerotic heart disease of native coronary artery with other forms of angina pectoris: Secondary | ICD-10-CM | POA: Diagnosis not present

## 2022-10-17 DIAGNOSIS — Z953 Presence of xenogenic heart valve: Secondary | ICD-10-CM

## 2022-10-17 DIAGNOSIS — I10 Essential (primary) hypertension: Secondary | ICD-10-CM

## 2022-10-17 DIAGNOSIS — Z951 Presence of aortocoronary bypass graft: Secondary | ICD-10-CM

## 2022-10-17 MED ORDER — LISINOPRIL 20 MG PO TABS
20.0000 mg | ORAL_TABLET | Freq: Two times a day (BID) | ORAL | 3 refills | Status: DC
  Filled 2022-10-17 – 2023-01-20 (×2): qty 180, 90d supply, fill #0
  Filled 2023-04-21: qty 180, 90d supply, fill #1
  Filled 2023-07-30: qty 180, 90d supply, fill #2

## 2022-10-17 NOTE — Patient Instructions (Signed)
Medication Instructions:  Continue your current medications  *If you need a refill on your cardiac medications before your next appointment, please call your pharmacy*   Follow-Up: At [facility], you and your health needs are our priority.  As part of our continuing mission to provide you with exceptional heart care, we have created designated Provider Care Teams.  These Care Teams include your primary Cardiologist (physician) and Advanced Practice Providers (APPs -  Physician Assistants and Nurse Practitioners) who all work together to provide you with the care you need, when you need it.  We recommend signing up for the patient portal called "MyChart".  Sign up information is provided on this After Visit Summary.  MyChart is used to connect with patients for Virtual Visits (Telemedicine).  Patients are able to view lab/test results, encounter notes, upcoming appointments, etc.  Non-urgent messages can be sent to your provider as well.   To learn more about what you can do with MyChart, go to [email or web address].    Your next appointment:   In March as scheduled

## 2022-10-17 NOTE — Progress Notes (Signed)
Cardiology Office Note:  .   Date:  10/17/2022  ID:  [name] [name], DOB [birth year], MRN [id number] PCP: [name] Raider, MD  [facility] Providers Cardiologist:  [name] Si, MD    History of Present Illness: .   [name] is a 82 y.o. male with hx of CAD s/p CABG 2016 (LIMA-LAD, SVG-R1/OM1, SVG-PDA), aortic stenosis s/p AVR 2016, bradycardia, syncope.  He did have postoperative atrial fibrillation/flutter after 2016 CABG/AVR but discharged in sinus rhythm has not required long-term anticoagulation.    Metoprolol previously switched to carvedilol due to bradycardia and hypertension.  Simvastatin changed to atorvastatin so as to not interact with amlodipine.  Amlodipine later reduced due to edema and spironolactone initiated.  He had episode of syncope in the setting of hypotension, working outside, poor oral intake.  Antihypertensive regimen previously reduced.  As recent echo 10/04/2021 normal LVEF 60 to 65%, no RWMA, moderate asymmetric LVH, grade 2 diastolic dysfunction, RV SF normal, mild MR, aortic valve replacement with mean gradient 14 mmHg with normal prosthetic function.  Carotid duplex 09/2021 left 1-39% stenosis.  Presents today for follow-up with his wife. Feeling well since last seen. PCP recently increased and resumed Methimazole. Most recent BP at home 115/65. Reports no shortness of breath nor dyspnea on exertion. Reports no chest pain, pressure, or tightness. No edema, orthopnea, PND. Reports no palpitations.  No recurrent lightheadedness, dizziness, syncope.   ROS: Please see the history of present illness.    All other systems reviewed and are negative.   Studies Reviewed: .        Cardiac Studies & Procedures   CARDIAC CATHETERIZATION  CARDIAC CATHETERIZATION 09/23/2014  Narrative Images from the original result were not included.  Severe multivessel disease:  Prox LAD to Mid LAD lesion, 95% stenosed. Ramus lesion, 95% stenosed. Ost 1st Diag lesion,  90% stenosed.  Dist RCA lesion, 90% stenosed.  1st Mrg lesion, 60% stenosed. Dist LAD lesion, 40% stenosed.  There is mild left ventricular systolic dysfunction. There does appear to be a distal LAD distribution wall motion abnormality   Severe multivessel disease involving the ostial-proximal LAD and what appears to be either a bifurcating diagonal or ramus and diagonal branch. There is also focal distal RCA disease.  Based on the location of the LAD lesion, there is not adequate landing zone for a stent prior to the Left Main. Additionally, with involvement of 2 major branches this would be a very high-risk PCI. Overall best recommendation would be to pursue CABG.  The patient also has a significant aortic valve murmur with gradient noted by catheterization. Recommend echocardiogram to better assess left ventricular outflow/aortic valve stenosis. Pending the severity of aortic valve disease, would consider potentially Performing Aortic Valve Replacement in Addition to CABG.   Plan: The patient will be admitted to the TCU STEPDOWN Unit for close monitoring. Cardiac surgery has been consulted by Dr. Royann [name] Will initiate IV heparin 6 hours following TR band removal Aggressive risk factor modification. Blood pressure were tolerated, would consider IV nitroglycerin.    [name] [name], M.D., M.S. Interventional Cardiologist  Pager # [phone]  Findings Coronary Findings Diagnostic  Dominance: Co-dominant  Left Main The vessel is large . Very Large  There is mild diffuse disease throughout the vessel. Mild calcification  Left Anterior Descending diffuse thrombotic eccentric ulcerative located at the major branch .   Involves the ostium of the D1 as well as Ramus Intermedius discrete .  First Diagonal Branch The vessel is  moderate in size. discrete ulcerative located at the major branch .  Second Diagonal Branch The vessel is small in size.  Ramus Intermedius The  vessel is moderate in size . thrombotic ulcerative .  Left Circumflex The vessel is large . The vessel is tortuous.  First Obtuse Marginal Branch The vessel is small in size.  Second Obtuse Marginal Branch The vessel is large in size.  Right Coronary Artery The vessel is large . discrete located proximal to major branch .  Acute Marginal Branch The vessel is small in size.  Right Posterior Atrioventricular Artery The vessel is small in size.  First Right Posterolateral Branch The vessel is small in size.  Intervention  No interventions have been documented.   STRESS TESTS  MYOCARDIAL PERFUSION IMAGING 05/03/2020  Narrative  The left ventricular ejection fraction is normal (55-65%).  Nuclear stress EF: 63%.  There was no ST segment deviation noted during stress.  This is a low risk study.  There is a small, moderate, reversible defect in the inferoapex.  Wall motion is normal in this region.  There is significant extracardiac bowel tracer uptake adjacent to the inferoapex with stress on both supine and upright stress images.  This finding likely represents attenuation artifact from extracardiac tracer uptake, but cannot exclude a small area of ischemia.  Overall study is low risk.   ECHOCARDIOGRAM  ECHOCARDIOGRAM COMPLETE 10/04/2021  Narrative ECHOCARDIOGRAM REPORT    Patient Name:   [name] [name] Date of Exam: 10/04/2021 Medical Rec #:  [id number]       Height:       73.0 in Accession #:    [id number]      Weight:       173.2 lb Date of Birth:  1940/05/24       BSA:          2.024 m Patient Age:    81 years        BP:           140/75 mmHg Patient Gender: M               HR:           58 bpm. Exam Location:  [address]  Procedure: 2D Echo, 3D Echo, Strain Analysis, Color Doppler and Cardiac Doppler  Indications:    Coronary artery disease of native artery of native heart with stable angina pectoris  History:        Patient has prior history of  Echocardiogram examinations, most recent 05/03/2020. Prior CABG, Signs/Symptoms:Syncope; Risk Factors:Hypertension, Former Smoker and Dyslipidemia. 25 mm [facility] Ease pericardial tissue valve 09/2014.  Sonographer:    [name] [name] RDCS Referring Phys: [id number] ELIZABETH PECK  IMPRESSIONS   1. Left ventricular ejection fraction, by estimation, is 60 to 65%. The left ventricle has normal function. The left ventricle has no regional wall motion abnormalities. There is moderate asymmetric left ventricular hypertrophy of the basal-septal segment. Left ventricular diastolic parameters are consistent with Grade II diastolic dysfunction (pseudonormalization). 2. Right ventricular systolic function is normal. The right ventricular size is mildly enlarged. There is normal pulmonary artery systolic pressure. 3. Left atrial size was mildly dilated. 4. The mitral valve is grossly normal. Mild mitral valve regurgitation. No evidence of mitral stenosis. 5. The aortic valve has been replaced with a 25 mm Magna Ease Bioprosthetic valve. Aortic valve regurgitation is not visualized (small diastolic flow in PLAX view is likely coronary). Effectiveorifice area, by VTI measures 2.11 cm. Aortic valve  mean gradient measures 14.0 mmHg. Aortic valve acceleration time measures 85 msec. Normal prosthetic function. 6. The inferior vena cava is normal in size with greater than 50% respiratory variability, suggesting right atrial pressure of 3 mmHg.  FINDINGS Left Ventricle: Left ventricular ejection fraction, by estimation, is 60 to 65%. The left ventricle has normal function. The left ventricle has no regional wall motion abnormalities. Global longitudinal strain performed but not reported based on interpreter judgement due to suboptimal tracking. 3D left ventricular ejection fraction analysis performed but not reported based on interpreter judgement due to suboptimal tracking. The left ventricular internal cavity  size was small. There is moderate asymmetric left ventricular hypertrophy of the basal-septal segment. Left ventricular diastolic parameters are consistent with Grade II diastolic dysfunction (pseudonormalization).  Right Ventricle: The right ventricular size is mildly enlarged. No increase in right ventricular wall thickness. Right ventricular systolic function is normal. There is normal pulmonary artery systolic pressure. The tricuspid regurgitant velocity is 2.33 m/s, and with an assumed right atrial pressure of 3 mmHg, the estimated right ventricular systolic pressure is 24.7 mmHg.  Left Atrium: Left atrial size was mildly dilated.  Right Atrium: Right atrial size was normal in size.  Pericardium: There is no evidence of pericardial effusion.  Mitral Valve: The mitral valve is grossly normal. Mild mitral valve regurgitation. No evidence of mitral valve stenosis.  Tricuspid Valve: The tricuspid valve is normal in structure. Tricuspid valve regurgitation is mild . No evidence of tricuspid stenosis.  Aortic Valve: The aortic valve has been repaired/replaced. Aortic valve regurgitation is not visualized. Aortic valve mean gradient measures 14.0 mmHg. Aortic valve peak gradient measures 26.0 mmHg. Aortic valve area, by VTI measures 2.11 cm.  Pulmonic Valve: The pulmonic valve was normal in structure. Pulmonic valve regurgitation is mild. No evidence of pulmonic stenosis.  Aorta: The aortic root, ascending aorta, aortic arch and descending aorta are all structurally normal, with no evidence of dilitation or obstruction.  Venous: The inferior vena cava is normal in size with greater than 50% respiratory variability, suggesting right atrial pressure of 3 mmHg.  IAS/Shunts: No atrial level shunt detected by color flow Doppler.   LEFT VENTRICLE PLAX 2D LVIDd:         3.71 cm   Diastology LVIDs:         2.44 cm   LV e' medial:    4.57 cm/s LV PW:         1.35 cm   LV E/e' medial:  14.6 LV  IVS:        1.24 cm   LV e' lateral:   5.87 cm/s LVOT diam:     2.70 cm   LV E/e' lateral: 11.3 LV SV:         129 LV SV Index:   64 LVOT Area:     5.73 cm   RIGHT VENTRICLE RV Basal diam:  4.50 cm RV Mid diam:    2.79 cm RV S prime:     12.90 cm/s TAPSE (M-mode): 2.3 cm  LEFT ATRIUM             Index        RIGHT ATRIUM           Index LA Vol (A2C):   57.8 ml 28.56 ml/m  RA Area:     21.70 cm LA Vol (A4C):   76.3 ml 37.70 ml/m  RA Volume:   61.50 ml  30.39 ml/m LA Biplane Vol: 72.8 ml 35.97 ml/m  AORTIC VALVE AV Area (Vmax):    1.95 cm AV Area (Vmean):   2.00 cm AV Area (VTI):     2.11 cm AV Vmax:           255.00 cm/s AV Vmean:          173.000 cm/s AV VTI:            0.612 m AV Peak Grad:      26.0 mmHg AV Mean Grad:      14.0 mmHg LVOT Vmax:         87.00 cm/s LVOT Vmean:        60.300 cm/s LVOT VTI:          0.226 m LVOT/AV VTI ratio: 0.37  AORTA Ao Asc diam: 3.20 cm  MITRAL VALVE                 TRICUSPID VALVE MV Area (PHT): 2.83 cm      TR Peak grad:   21.7 mmHg MV Decel Time: 268 msec      TR Vmax:        233.00 cm/s MR Peak grad:   125.9 mmHg MR Mean grad:   69.0 mmHg    SHUNTS MR Vmax:        561.00 cm/s  Systemic VTI:  0.23 m MR Vmean:       380.0 cm/s   Systemic Diam: 2.70 cm MR PISA:        1.01 cm MR PISA Radius: 0.40 cm MV E velocity: 66.60 cm/s MV A velocity: 69.70 cm/s MV E/A ratio:  0.96  Riley Lam MD Electronically signed by Riley Lam MD Signature Date/Time: 10/04/2021/2:12:04 PM    Final   TEE  ECHO TEE 09/28/2014  Narrative *[facility]* *[facility]* [address] [place], [place] [address] [phone]  ------------------------------------------------------------------- Intraoperative Transesophageal Echocardiography  Patient:    Forbes, Parziale MR #:       [id number] Study Date: 09/28/2014 Gender:     M Age:        74 Height: Weight: BSA: Pt. Status: Room:  REFERRING     Charlett Lango, MD PERFORMING   Jairo Ben, MD SONOGRAPHER  Arvil Chaco  cc:  ------------------------------------------------------------------- LV EF: 50% -   55%  ------------------------------------------------------------------- Indications:     Aortic valve repair/replacement CABG.  ------------------------------------------------------------------- History:   PMH:  Aortic valve stenosis.  ------------------------------------------------------------------- Study Conclusions  - Left ventricle: The cavity size was normal. Wall thickness was normal. Systolic function was normal. The estimated ejection fraction was in the range of 50% to 55%. Wall motion was normal; there were no regional wall motion abnormalities. - Aortic valve: Normal-sized, mildly calcified annulus. Trileaflet; moderately calcified leaflets. Cusp separation was moderately reduced. Right coronary and noncoronary cusp mobility was restricted, with calcification of leaflet edges. The Left coronary cusp mobility appeared normal. There was very mild stenosis, AVA 1.25 cm2 (VTI).. There was trivial regurgitation. - Staged echo: limited post-CPB study: Good LVEF. EF 55-60%. Prosthetic aortic valve well seated in the Aortic annulus. No AI seen in LV outflow tract. Post aortic valve replacement images demonstrate no residual valvular insufficiency or perivalvular leak. No change post bypass in mitral valve function.  Impressions:  - Post aortic valve replacement surgery, the prosthetic valve appears to be functioning normally. Excellent prosthetic aortic valve function without evidence for perivalvular leak. Other valves unchanged. No other change from pre-bypass images.  Intraoperative transesophageal echocardiography.  Birthdate: Patient birthdate: [birth year].  Age:  Patient is 82 yr old.  Sex: Gender: male.  Patient status:  Inpatient.  Study date:  Study date: 09/28/2014. Study time: 07:45  AM.  Location:  Operating room.  -------------------------------------------------------------------  ------------------------------------------------------------------- Left ventricle:  The cavity size was normal. Wall thickness was normal. Systolic function was normal. The estimated ejection fraction was in the range of 50% to 55%. Wall motion was normal; there were no regional wall motion abnormalities.  ------------------------------------------------------------------- Aortic valve:   Normal-sized, mildly calcified annulus. Trileaflet; moderately calcified leaflets. Cusp separation was moderately reduced. Right coronary and noncoronary cusp mobility was restricted, with calcification of leaflet edges. The left coronary cusp mobility appeared normal.  Doppler:  There was very mild stenosis, AVA 1.25 cm2 (VTI). There was trivial regurgitation.  ------------------------------------------------------------------- Aorta:  The aorta was minimallly calcified.  ------------------------------------------------------------------- Mitral valve:   Structurally normal valve. Normal thickness, noncalcified leaflets . Leaflet separation was normal. Mobility was not restricted. No echocardiographic evidence for prolapse. Doppler:   There was no evidence for stenosis.   There was no significant regurgitation.  ------------------------------------------------------------------- Left atrium:   No evidence of thrombus in the atrial cavity or appendage.  ------------------------------------------------------------------- Atrial septum:  No defect or patent foramen ovale was identified.  ------------------------------------------------------------------- Pulmonary veins:  Visualization of the pulmonary venous anatomy is incomplete, but a significant abnormality is unlikely.  ------------------------------------------------------------------- Right ventricle:  The cavity size was normal. Wall  thickness was normal. Systolic function was normal.  ------------------------------------------------------------------- Pulmonic valve:    Structurally normal valve.   Cusp separation was normal.  Doppler:  There was trivial regurgitation around the PA catheter.  ------------------------------------------------------------------- Tricuspid valve:   Structurally normal valve.   Leaflet separation was normal.  Doppler:  There was mild regurgitation.  ------------------------------------------------------------------- Right atrium:  The atrium was normal in size.  No evidence of thrombus.  ------------------------------------------------------------------- Pre bypass:  Post bypass:  ------------------------------------------------------------------- Post procedure conclusions Left ventricle:  limited post-CPB study: Good LVEF. EF 55-60%. Aortic valve:  - Prosthetic aortic valve well seated in the Aortic annulus. No AI seen in LV outflow tract. Post aortic valve replacement images demonstrate no residual valvular insufficiency or perivalvular leak.  Mitral valve:  - No change post bypass in mitral valve function.  Ascending Aorta:  - The aorta was minimallly calcified.  ------------------------------------------------------------------- Prepared and Electronically Authenticated by  Jairo Ben, MD 2016-09-07T18:09:13            Risk Assessment/Calculations:             Physical Exam:   VS:  BP 128/78   Pulse 60   Ht 6\' 2"  (1.88 m)   Wt 172 lb 3.2 oz (78.1 kg)   SpO2 96%   BMI 22.11 kg/m    Wt Readings from Last 3 Encounters:  10/17/22 172 lb 3.2 oz (78.1 kg)  05/14/22 170 lb 12.8 oz (77.5 kg)  11/01/21 166 lb 12.8 oz (75.7 kg)    GEN: Well nourished, well developed in no acute distress NECK: No JVD; No carotid bruits CARDIAC: RRR, no murmurs, rubs, gallops RESPIRATORY:  Clear to auscultation without rales, wheezing or rhonchi  ABDOMEN: Soft,  non-tender, non-distended EXTREMITIES:  No edema; No deformity   ASSESSMENT AND PLAN: .    HTN -initial BP in clinic mildly elevated but improved to goal less than 130/80 on repeat.  Reports home BP routinely less than 130/80.  Continue present antihypertensive regimen Coreg 6.25 mg twice daily, lisinopril 20 mg twice daily, spironolactone 25 mg daily.  CAD/HLD,  LDL goal less than 70- Stable with no anginal symptoms. No indication for ischemic evaluation.  GDMT aspirin, atorvastatin, carvedilol. Recommend aiming for 150 minutes of moderate intensity activity per week and following a heart healthy diet.    S/p AVR with bioprosthetic valve-09/2021 echo mean gradient 14 mmHg with valve prosthesis functioning appropriately.  Continue optimal BP control, as above.  Continue SBE prophylaxis.       Dispo: follow up in 6 months  Signed, Alver Sorrow, NP

## 2022-10-18 ENCOUNTER — Other Ambulatory Visit (HOSPITAL_COMMUNITY): Payer: Self-pay

## 2022-10-21 DIAGNOSIS — M25562 Pain in left knee: Secondary | ICD-10-CM | POA: Diagnosis not present

## 2022-10-21 DIAGNOSIS — R262 Difficulty in walking, not elsewhere classified: Secondary | ICD-10-CM | POA: Diagnosis not present

## 2022-10-21 DIAGNOSIS — M25561 Pain in right knee: Secondary | ICD-10-CM | POA: Diagnosis not present

## 2022-10-21 DIAGNOSIS — M1991 Primary osteoarthritis, unspecified site: Secondary | ICD-10-CM | POA: Diagnosis not present

## 2022-10-22 DIAGNOSIS — Z08 Encounter for follow-up examination after completed treatment for malignant neoplasm: Secondary | ICD-10-CM | POA: Diagnosis not present

## 2022-10-22 DIAGNOSIS — L820 Inflamed seborrheic keratosis: Secondary | ICD-10-CM | POA: Diagnosis not present

## 2022-10-22 DIAGNOSIS — L570 Actinic keratosis: Secondary | ICD-10-CM | POA: Diagnosis not present

## 2022-10-22 DIAGNOSIS — L821 Other seborrheic keratosis: Secondary | ICD-10-CM | POA: Diagnosis not present

## 2022-10-22 DIAGNOSIS — Z789 Other specified health status: Secondary | ICD-10-CM | POA: Diagnosis not present

## 2022-10-22 DIAGNOSIS — D225 Melanocytic nevi of trunk: Secondary | ICD-10-CM | POA: Diagnosis not present

## 2022-10-22 DIAGNOSIS — D485 Neoplasm of uncertain behavior of skin: Secondary | ICD-10-CM | POA: Diagnosis not present

## 2022-10-22 DIAGNOSIS — L2989 Other pruritus: Secondary | ICD-10-CM | POA: Diagnosis not present

## 2022-10-22 DIAGNOSIS — L538 Other specified erythematous conditions: Secondary | ICD-10-CM | POA: Diagnosis not present

## 2022-10-22 DIAGNOSIS — Z85828 Personal history of other malignant neoplasm of skin: Secondary | ICD-10-CM | POA: Diagnosis not present

## 2022-10-22 DIAGNOSIS — L814 Other melanin hyperpigmentation: Secondary | ICD-10-CM | POA: Diagnosis not present

## 2022-10-23 DIAGNOSIS — M25561 Pain in right knee: Secondary | ICD-10-CM | POA: Diagnosis not present

## 2022-10-23 DIAGNOSIS — R262 Difficulty in walking, not elsewhere classified: Secondary | ICD-10-CM | POA: Diagnosis not present

## 2022-10-23 DIAGNOSIS — M1991 Primary osteoarthritis, unspecified site: Secondary | ICD-10-CM | POA: Diagnosis not present

## 2022-10-23 DIAGNOSIS — M25562 Pain in left knee: Secondary | ICD-10-CM | POA: Diagnosis not present

## 2022-10-28 DIAGNOSIS — M25561 Pain in right knee: Secondary | ICD-10-CM | POA: Diagnosis not present

## 2022-10-28 DIAGNOSIS — M25562 Pain in left knee: Secondary | ICD-10-CM | POA: Diagnosis not present

## 2022-10-28 DIAGNOSIS — R262 Difficulty in walking, not elsewhere classified: Secondary | ICD-10-CM | POA: Diagnosis not present

## 2022-10-28 DIAGNOSIS — M1991 Primary osteoarthritis, unspecified site: Secondary | ICD-10-CM | POA: Diagnosis not present

## 2022-10-30 DIAGNOSIS — M25562 Pain in left knee: Secondary | ICD-10-CM | POA: Diagnosis not present

## 2022-10-30 DIAGNOSIS — M25561 Pain in right knee: Secondary | ICD-10-CM | POA: Diagnosis not present

## 2022-10-30 DIAGNOSIS — R262 Difficulty in walking, not elsewhere classified: Secondary | ICD-10-CM | POA: Diagnosis not present

## 2022-10-30 DIAGNOSIS — M1991 Primary osteoarthritis, unspecified site: Secondary | ICD-10-CM | POA: Diagnosis not present

## 2022-11-04 DIAGNOSIS — M25562 Pain in left knee: Secondary | ICD-10-CM | POA: Diagnosis not present

## 2022-11-04 DIAGNOSIS — M25561 Pain in right knee: Secondary | ICD-10-CM | POA: Diagnosis not present

## 2022-11-04 DIAGNOSIS — R262 Difficulty in walking, not elsewhere classified: Secondary | ICD-10-CM | POA: Diagnosis not present

## 2022-11-04 DIAGNOSIS — M1991 Primary osteoarthritis, unspecified site: Secondary | ICD-10-CM | POA: Diagnosis not present

## 2022-11-06 DIAGNOSIS — R262 Difficulty in walking, not elsewhere classified: Secondary | ICD-10-CM | POA: Diagnosis not present

## 2022-11-06 DIAGNOSIS — M25562 Pain in left knee: Secondary | ICD-10-CM | POA: Diagnosis not present

## 2022-11-06 DIAGNOSIS — M25561 Pain in right knee: Secondary | ICD-10-CM | POA: Diagnosis not present

## 2022-11-06 DIAGNOSIS — M1991 Primary osteoarthritis, unspecified site: Secondary | ICD-10-CM | POA: Diagnosis not present

## 2022-11-07 DIAGNOSIS — N5201 Erectile dysfunction due to arterial insufficiency: Secondary | ICD-10-CM | POA: Diagnosis not present

## 2022-11-07 DIAGNOSIS — Z8546 Personal history of malignant neoplasm of prostate: Secondary | ICD-10-CM | POA: Diagnosis not present

## 2022-11-11 DIAGNOSIS — H04122 Dry eye syndrome of left lacrimal gland: Secondary | ICD-10-CM | POA: Diagnosis not present

## 2022-11-11 DIAGNOSIS — M1991 Primary osteoarthritis, unspecified site: Secondary | ICD-10-CM | POA: Diagnosis not present

## 2022-11-11 DIAGNOSIS — M25561 Pain in right knee: Secondary | ICD-10-CM | POA: Diagnosis not present

## 2022-11-11 DIAGNOSIS — H40021 Open angle with borderline findings, high risk, right eye: Secondary | ICD-10-CM | POA: Diagnosis not present

## 2022-11-11 DIAGNOSIS — Z961 Presence of intraocular lens: Secondary | ICD-10-CM | POA: Diagnosis not present

## 2022-11-11 DIAGNOSIS — H401123 Primary open-angle glaucoma, left eye, severe stage: Secondary | ICD-10-CM | POA: Diagnosis not present

## 2022-11-11 DIAGNOSIS — M25562 Pain in left knee: Secondary | ICD-10-CM | POA: Diagnosis not present

## 2022-11-11 DIAGNOSIS — Z947 Corneal transplant status: Secondary | ICD-10-CM | POA: Diagnosis not present

## 2022-11-11 DIAGNOSIS — R262 Difficulty in walking, not elsewhere classified: Secondary | ICD-10-CM | POA: Diagnosis not present

## 2022-11-13 DIAGNOSIS — M25562 Pain in left knee: Secondary | ICD-10-CM | POA: Diagnosis not present

## 2022-11-13 DIAGNOSIS — M1991 Primary osteoarthritis, unspecified site: Secondary | ICD-10-CM | POA: Diagnosis not present

## 2022-11-13 DIAGNOSIS — R262 Difficulty in walking, not elsewhere classified: Secondary | ICD-10-CM | POA: Diagnosis not present

## 2022-11-13 DIAGNOSIS — M25561 Pain in right knee: Secondary | ICD-10-CM | POA: Diagnosis not present

## 2022-11-18 DIAGNOSIS — M1991 Primary osteoarthritis, unspecified site: Secondary | ICD-10-CM | POA: Diagnosis not present

## 2022-11-18 DIAGNOSIS — M25561 Pain in right knee: Secondary | ICD-10-CM | POA: Diagnosis not present

## 2022-11-18 DIAGNOSIS — M25562 Pain in left knee: Secondary | ICD-10-CM | POA: Diagnosis not present

## 2022-11-18 DIAGNOSIS — R262 Difficulty in walking, not elsewhere classified: Secondary | ICD-10-CM | POA: Diagnosis not present

## 2022-11-19 DIAGNOSIS — L538 Other specified erythematous conditions: Secondary | ICD-10-CM | POA: Diagnosis not present

## 2022-11-19 DIAGNOSIS — Z09 Encounter for follow-up examination after completed treatment for conditions other than malignant neoplasm: Secondary | ICD-10-CM | POA: Diagnosis not present

## 2022-11-19 DIAGNOSIS — Z872 Personal history of diseases of the skin and subcutaneous tissue: Secondary | ICD-10-CM | POA: Diagnosis not present

## 2022-11-19 DIAGNOSIS — L820 Inflamed seborrheic keratosis: Secondary | ICD-10-CM | POA: Diagnosis not present

## 2022-11-19 DIAGNOSIS — Z789 Other specified health status: Secondary | ICD-10-CM | POA: Diagnosis not present

## 2022-11-19 DIAGNOSIS — R208 Other disturbances of skin sensation: Secondary | ICD-10-CM | POA: Diagnosis not present

## 2022-11-20 DIAGNOSIS — R262 Difficulty in walking, not elsewhere classified: Secondary | ICD-10-CM | POA: Diagnosis not present

## 2022-11-20 DIAGNOSIS — M25562 Pain in left knee: Secondary | ICD-10-CM | POA: Diagnosis not present

## 2022-11-20 DIAGNOSIS — M25561 Pain in right knee: Secondary | ICD-10-CM | POA: Diagnosis not present

## 2022-11-20 DIAGNOSIS — M1991 Primary osteoarthritis, unspecified site: Secondary | ICD-10-CM | POA: Diagnosis not present

## 2022-11-21 DIAGNOSIS — E0500 Thyrotoxicosis with diffuse goiter without thyrotoxic crisis or storm: Secondary | ICD-10-CM | POA: Diagnosis not present

## 2022-11-25 DIAGNOSIS — M25561 Pain in right knee: Secondary | ICD-10-CM | POA: Diagnosis not present

## 2022-11-25 DIAGNOSIS — M25562 Pain in left knee: Secondary | ICD-10-CM | POA: Diagnosis not present

## 2022-11-25 DIAGNOSIS — M1991 Primary osteoarthritis, unspecified site: Secondary | ICD-10-CM | POA: Diagnosis not present

## 2022-11-25 DIAGNOSIS — R262 Difficulty in walking, not elsewhere classified: Secondary | ICD-10-CM | POA: Diagnosis not present

## 2022-11-27 DIAGNOSIS — M25561 Pain in right knee: Secondary | ICD-10-CM | POA: Diagnosis not present

## 2022-11-27 DIAGNOSIS — R262 Difficulty in walking, not elsewhere classified: Secondary | ICD-10-CM | POA: Diagnosis not present

## 2022-11-27 DIAGNOSIS — M25562 Pain in left knee: Secondary | ICD-10-CM | POA: Diagnosis not present

## 2022-11-27 DIAGNOSIS — M1991 Primary osteoarthritis, unspecified site: Secondary | ICD-10-CM | POA: Diagnosis not present

## 2022-12-02 DIAGNOSIS — M25561 Pain in right knee: Secondary | ICD-10-CM | POA: Diagnosis not present

## 2022-12-02 DIAGNOSIS — M25562 Pain in left knee: Secondary | ICD-10-CM | POA: Diagnosis not present

## 2022-12-02 DIAGNOSIS — M1991 Primary osteoarthritis, unspecified site: Secondary | ICD-10-CM | POA: Diagnosis not present

## 2022-12-02 DIAGNOSIS — R262 Difficulty in walking, not elsewhere classified: Secondary | ICD-10-CM | POA: Diagnosis not present

## 2022-12-04 DIAGNOSIS — M25561 Pain in right knee: Secondary | ICD-10-CM | POA: Diagnosis not present

## 2022-12-04 DIAGNOSIS — R262 Difficulty in walking, not elsewhere classified: Secondary | ICD-10-CM | POA: Diagnosis not present

## 2022-12-04 DIAGNOSIS — M25562 Pain in left knee: Secondary | ICD-10-CM | POA: Diagnosis not present

## 2022-12-04 DIAGNOSIS — M1991 Primary osteoarthritis, unspecified site: Secondary | ICD-10-CM | POA: Diagnosis not present

## 2022-12-09 DIAGNOSIS — M1991 Primary osteoarthritis, unspecified site: Secondary | ICD-10-CM | POA: Diagnosis not present

## 2022-12-09 DIAGNOSIS — R262 Difficulty in walking, not elsewhere classified: Secondary | ICD-10-CM | POA: Diagnosis not present

## 2022-12-09 DIAGNOSIS — M25562 Pain in left knee: Secondary | ICD-10-CM | POA: Diagnosis not present

## 2022-12-09 DIAGNOSIS — M25561 Pain in right knee: Secondary | ICD-10-CM | POA: Diagnosis not present

## 2022-12-11 DIAGNOSIS — M25562 Pain in left knee: Secondary | ICD-10-CM | POA: Diagnosis not present

## 2022-12-11 DIAGNOSIS — M25561 Pain in right knee: Secondary | ICD-10-CM | POA: Diagnosis not present

## 2022-12-11 DIAGNOSIS — R262 Difficulty in walking, not elsewhere classified: Secondary | ICD-10-CM | POA: Diagnosis not present

## 2022-12-11 DIAGNOSIS — M1991 Primary osteoarthritis, unspecified site: Secondary | ICD-10-CM | POA: Diagnosis not present

## 2022-12-16 DIAGNOSIS — M1991 Primary osteoarthritis, unspecified site: Secondary | ICD-10-CM | POA: Diagnosis not present

## 2022-12-16 DIAGNOSIS — M25561 Pain in right knee: Secondary | ICD-10-CM | POA: Diagnosis not present

## 2022-12-16 DIAGNOSIS — R262 Difficulty in walking, not elsewhere classified: Secondary | ICD-10-CM | POA: Diagnosis not present

## 2022-12-16 DIAGNOSIS — M25562 Pain in left knee: Secondary | ICD-10-CM | POA: Diagnosis not present

## 2022-12-20 ENCOUNTER — Other Ambulatory Visit (HOSPITAL_COMMUNITY): Payer: Self-pay

## 2022-12-23 DIAGNOSIS — M1991 Primary osteoarthritis, unspecified site: Secondary | ICD-10-CM | POA: Diagnosis not present

## 2022-12-23 DIAGNOSIS — M25561 Pain in right knee: Secondary | ICD-10-CM | POA: Diagnosis not present

## 2022-12-23 DIAGNOSIS — R262 Difficulty in walking, not elsewhere classified: Secondary | ICD-10-CM | POA: Diagnosis not present

## 2022-12-23 DIAGNOSIS — M25562 Pain in left knee: Secondary | ICD-10-CM | POA: Diagnosis not present

## 2022-12-30 DIAGNOSIS — R262 Difficulty in walking, not elsewhere classified: Secondary | ICD-10-CM | POA: Diagnosis not present

## 2022-12-30 DIAGNOSIS — M1991 Primary osteoarthritis, unspecified site: Secondary | ICD-10-CM | POA: Diagnosis not present

## 2022-12-30 DIAGNOSIS — M25561 Pain in right knee: Secondary | ICD-10-CM | POA: Diagnosis not present

## 2022-12-30 DIAGNOSIS — M25562 Pain in left knee: Secondary | ICD-10-CM | POA: Diagnosis not present

## 2023-01-01 DIAGNOSIS — M25562 Pain in left knee: Secondary | ICD-10-CM | POA: Diagnosis not present

## 2023-01-01 DIAGNOSIS — M25561 Pain in right knee: Secondary | ICD-10-CM | POA: Diagnosis not present

## 2023-01-01 DIAGNOSIS — R262 Difficulty in walking, not elsewhere classified: Secondary | ICD-10-CM | POA: Diagnosis not present

## 2023-01-01 DIAGNOSIS — M1991 Primary osteoarthritis, unspecified site: Secondary | ICD-10-CM | POA: Diagnosis not present

## 2023-01-06 DIAGNOSIS — M25562 Pain in left knee: Secondary | ICD-10-CM | POA: Diagnosis not present

## 2023-01-06 DIAGNOSIS — M1991 Primary osteoarthritis, unspecified site: Secondary | ICD-10-CM | POA: Diagnosis not present

## 2023-01-06 DIAGNOSIS — M25561 Pain in right knee: Secondary | ICD-10-CM | POA: Diagnosis not present

## 2023-01-06 DIAGNOSIS — R262 Difficulty in walking, not elsewhere classified: Secondary | ICD-10-CM | POA: Diagnosis not present

## 2023-01-08 DIAGNOSIS — M1991 Primary osteoarthritis, unspecified site: Secondary | ICD-10-CM | POA: Diagnosis not present

## 2023-01-08 DIAGNOSIS — R262 Difficulty in walking, not elsewhere classified: Secondary | ICD-10-CM | POA: Diagnosis not present

## 2023-01-08 DIAGNOSIS — M25562 Pain in left knee: Secondary | ICD-10-CM | POA: Diagnosis not present

## 2023-01-08 DIAGNOSIS — M25561 Pain in right knee: Secondary | ICD-10-CM | POA: Diagnosis not present

## 2023-01-13 DIAGNOSIS — M25562 Pain in left knee: Secondary | ICD-10-CM | POA: Diagnosis not present

## 2023-01-13 DIAGNOSIS — M1991 Primary osteoarthritis, unspecified site: Secondary | ICD-10-CM | POA: Diagnosis not present

## 2023-01-13 DIAGNOSIS — R262 Difficulty in walking, not elsewhere classified: Secondary | ICD-10-CM | POA: Diagnosis not present

## 2023-01-13 DIAGNOSIS — M25561 Pain in right knee: Secondary | ICD-10-CM | POA: Diagnosis not present

## 2023-01-20 ENCOUNTER — Other Ambulatory Visit (HOSPITAL_BASED_OUTPATIENT_CLINIC_OR_DEPARTMENT_OTHER): Payer: Self-pay

## 2023-01-20 ENCOUNTER — Other Ambulatory Visit (HOSPITAL_COMMUNITY): Payer: Self-pay

## 2023-01-20 MED ORDER — METHIMAZOLE 5 MG PO TABS
5.0000 mg | ORAL_TABLET | Freq: Every day | ORAL | 0 refills | Status: DC
  Filled 2023-01-20: qty 90, 90d supply, fill #0

## 2023-01-21 ENCOUNTER — Other Ambulatory Visit (HOSPITAL_COMMUNITY): Payer: Self-pay

## 2023-02-06 DIAGNOSIS — R82998 Other abnormal findings in urine: Secondary | ICD-10-CM | POA: Diagnosis not present

## 2023-02-06 LAB — LAB REPORT - SCANNED
Calcium: 10.4
Creatinine, POC: 36 mg/dL
EGFR: 46
Free T4: 0.6 ng/dL
Microalb Creat Ratio: 87.3
Microalbumin, Urine: 3.15
TSH: 4.86 (ref 0.41–5.90)

## 2023-02-25 ENCOUNTER — Other Ambulatory Visit (HOSPITAL_COMMUNITY): Payer: Self-pay

## 2023-02-26 DIAGNOSIS — R82998 Other abnormal findings in urine: Secondary | ICD-10-CM | POA: Diagnosis not present

## 2023-03-18 ENCOUNTER — Other Ambulatory Visit (HOSPITAL_COMMUNITY): Payer: Self-pay

## 2023-03-18 MED ORDER — MELOXICAM 15 MG PO TABS
15.0000 mg | ORAL_TABLET | Freq: Every day | ORAL | 1 refills | Status: DC
  Filled 2023-03-18: qty 90, 90d supply, fill #0
  Filled 2023-06-17: qty 90, 90d supply, fill #1

## 2023-03-19 ENCOUNTER — Other Ambulatory Visit (HOSPITAL_COMMUNITY): Payer: Self-pay

## 2023-03-19 ENCOUNTER — Other Ambulatory Visit: Payer: Self-pay

## 2023-03-19 MED ORDER — OXYBUTYNIN CHLORIDE 5 MG PO TABS
5.0000 mg | ORAL_TABLET | Freq: Two times a day (BID) | ORAL | 3 refills | Status: AC
  Filled 2023-03-19: qty 180, 90d supply, fill #0
  Filled 2023-06-17: qty 180, 90d supply, fill #1
  Filled 2023-09-15: qty 180, 90d supply, fill #2
  Filled 2023-12-10: qty 180, 90d supply, fill #3

## 2023-04-15 ENCOUNTER — Other Ambulatory Visit (HOSPITAL_COMMUNITY): Payer: Self-pay

## 2023-04-17 ENCOUNTER — Ambulatory Visit (HOSPITAL_BASED_OUTPATIENT_CLINIC_OR_DEPARTMENT_OTHER): Payer: HMO | Admitting: Cardiovascular Disease

## 2023-04-17 ENCOUNTER — Encounter (HOSPITAL_BASED_OUTPATIENT_CLINIC_OR_DEPARTMENT_OTHER): Payer: Self-pay | Admitting: Cardiovascular Disease

## 2023-04-17 VITALS — BP 146/86 | HR 53 | Ht 73.0 in | Wt 173.8 lb

## 2023-04-17 DIAGNOSIS — E7800 Pure hypercholesterolemia, unspecified: Secondary | ICD-10-CM

## 2023-04-17 DIAGNOSIS — I951 Orthostatic hypotension: Secondary | ICD-10-CM

## 2023-04-17 DIAGNOSIS — I10 Essential (primary) hypertension: Secondary | ICD-10-CM

## 2023-04-17 DIAGNOSIS — Z951 Presence of aortocoronary bypass graft: Secondary | ICD-10-CM | POA: Diagnosis not present

## 2023-04-17 DIAGNOSIS — Z953 Presence of xenogenic heart valve: Secondary | ICD-10-CM | POA: Diagnosis not present

## 2023-04-17 DIAGNOSIS — I25110 Atherosclerotic heart disease of native coronary artery with unstable angina pectoris: Secondary | ICD-10-CM

## 2023-04-17 DIAGNOSIS — R55 Syncope and collapse: Secondary | ICD-10-CM

## 2023-04-17 NOTE — Progress Notes (Signed)
 Cardiology Office Note:  .   Date:  04/17/2023  ID:  [name] [name], DOB October 30, 1940, MRN [id number] PCP: Lupita Raider, MD  [facility] Providers Cardiologist:  Chilton Si, MD    History of Present Illness: .   [name] [name] is a 83 y.o. male with hypertension, CAD s/p CABG (LIMA-->LAD, SVG-->RI/OM1, SVG-->PDA), aortic stenosis s/p AVR, bradycardia, and syncope who presents for follow up.  Mr. Sjogren underwent 4 vessel CABG and AVR (25 mm [facility] Ease pericardial tissue valve) on 09/28/14.  His post-operative course was complicated by atrial fibrillation/flutter, but he was discharged in sinus rhythm.  He had an episode of lightheadedness in 08/2015 and was seen in the ED.  He was noted to have acute renal failure and was felt to be dehydrated. He had been working in the yard for several hours the day before and was not well hydrated.  Metoprolol was switched to carvedilol due to bradycardia and hypertension.  He was also started on amlodipine, so simvastatin was switched to atorvastatin.    Mr. Hardman has some right lower extremity edema that is chronic.  He had lower extremity Dopplers 03/2018 that were negative for DVT.  His PCP recommended a compression stocking which does seem to help when he wears it. His blood pressure was elevated so amlodipine was increased.  However, Mr. Preslar had increased lower extremity edema.  Therefore his dose was reduced and he was started on spironolactone.  Since then his BP has been much better.  He had an episode of syncope in the setting of hypotension, working outside and poor oral intake.   Amlodipine was discontinued.  He followed up with Joni Reining, DNP 02/2020 and was not orthostatic in the office though he did continue to report positional lightheadedness. He was referred for an echo 04/2020 that revealed LVEF 55%.  He had grade 1 diastolic dysfunction and his bioprosthetic aortic valve was functioning well.  The mean gradient was 18  mmHg.  He also had a nuclear stress test with LVEF 63% and no ischemia.  Spironolactone was reduced due to labile blood pressures  He follow-up with Edd Fabian in the next month and was stable but continued to report fatigue. He had his knee replaced 02/2021. His saw his PCP 08/2021 and reported an episode of syncope in July after being stung by hornets. He had an echocardiogram LVEF 60-65 and mod LDH His aortic valve gradient 15 mmHg. Carotid dopplers showed mild stenosis on the right. His spironolactone was increased from 12.5 mg to 25 mg when he saw Sharlene Dory NP on 10/15/2021. His atorvastatin was also increased from 40 mg to 80 mg.    At his visit 10/2021 he had been experiencing some fatigue and dizziness when bending over.  At his visit/2024 he was doing well and the dizziness had resolved.  He saw Gillian Shields, NP 09/2022 and continued to feel well.  Discussed the use of AI scribe software for clinical note transcription with the patient, who gave verbal consent to proceed.  History of Present Illness Mr. Mullaly experiences shortness of breath during physical exertion, such as lifting an eight-pound weight ball over his head or working in the backyard. This symptom has persisted without improvement over time. No swelling in his legs or feet and no shortness of breath when lying down.  His blood pressure has been well-controlled, with diastolic numbers typically in the sixties. He is on carvedilol, lisinopril, and spironolactone for blood pressure management, along  with aspirin due to his history of bypass surgery, and atorvastatin for cholesterol management. His heart rate, which used to be high, is now in the fifties.  No pain in his arms during physical activities and no pain in his legs when walking. He has a history of dizziness, which influences the management of his blood pressure to avoid exacerbating this issue.  He has concerns about the increased cost of medications, particularly  eye drops. Cardiac medications are generally low-cost generics.  ROS:  As per HPI  Studies Reviewed: Marland Kitchen   EKG Interpretation Date/Time:  Thursday April 17 2023 10:42:20 EDT Ventricular Rate:  53 PR Interval:  242 QRS Duration:  96 QT Interval:  420 QTC Calculation: 394 R Axis:   -54  Text Interpretation: Sinus bradycardia with 1st degree A-V block Left anterior fascicular block Septal infarct , age undetermined When compared with ECG of 27-Oct-2016 20:03, PREVIOUS ECG IS PRESENT Confirmed by Chilton Si ([id number]) on 04/17/2023 10:54:35 AM   Echo 09/2021:  1. Left ventricular ejection fraction, by estimation, is 60 to 65%. The  left ventricle has normal function. The left ventricle has no regional  wall motion abnormalities. There is moderate asymmetric left ventricular  hypertrophy of the basal-septal  segment. Left ventricular diastolic parameters are consistent with Grade  II diastolic dysfunction (pseudonormalization).   2. Right ventricular systolic function is normal. The right ventricular  size is mildly enlarged. There is normal pulmonary artery systolic  pressure.   3. Left atrial size was mildly dilated.   4. The mitral valve is grossly normal. Mild mitral valve regurgitation.  No evidence of mitral stenosis.   5. The aortic valve has been replaced with a 25 mm [employer] Ease  Bioprosthetic valve. Aortic valve regurgitation is not visualized (small  diastolic flow in PLAX view is likely coronary). Effectiveorifice area, by  VTI measures 2.11 cm. Aortic valve mean  gradient measures 14.0 mmHg. Aortic valve acceleration time measures 85  msec. Normal prosthetic function.   6. The inferior vena cava is normal in size with greater than 50%  respiratory variability, suggesting right atrial pressure of 3 mmHg.   Risk Assessment/Calculations:     HYPERTENSION CONTROL Vitals:   04/17/23 1045 04/17/23 2005  BP: (!) 150/100 (!) 146/86    The patient's blood pressure is  elevated above target today.  In order to address the patient's elevated BP: Blood pressure will be monitored at home to determine if medication changes need to be made.      Physical Exam:   VS:  BP (!) 146/86   Pulse (!) 53   Ht 6\' 1"  (1.854 m)   Wt 173 lb 12.8 oz (78.8 kg)   SpO2 97%   BMI 22.93 kg/m  , BMI Body mass index is 22.93 kg/m. GENERAL:  Well appearing HEENT: Pupils equal round and reactive, fundi not visualized, oral mucosa unremarkable NECK:  No jugular venous distention, waveform within normal limits, carotid upstroke brisk and symmetric, no bruits, no thyromegaly LUNGS:  Clear to auscultation bilaterally HEART:  RRR.  PMI not displaced or sustained,S1 and S2 within normal limits, no S3, no S4, no clicks, no rubs, no murmurs ABD:  Flat, positive bowel sounds normal in frequency in pitch, no bruits, no rebound, no guarding, no midline pulsatile mass, no hepatomegaly, no splenomegaly EXT:  2 plus pulses throughout, no edema, no cyanosis no clubbing SKIN:  No rashes no nodules NEURO:  Cranial nerves II through XII grossly intact, motor grossly intact  throughout [facility]:  Cognitively intact, oriented to person place and time   ASSESSMENT AND PLAN: .    Assessment & Plan # Exertional Dyspnea Persistent dyspnea with exertion suggests possible new coronary blockages. PET CT stress test recommended for perfusion assessment. - Order echocardiogram to assess heart function. - Schedule PET CT stress test.  # Coronary Artery Disease History of bypass and stents. Exertional dyspnea suggests possible new blockages. Aspirin continued for antiplatelet therapy. - Continue aspirin. - Perform echocardiogram and PET CT stress test.  # Hypertension Home BP 120s-130s systolic, with 20-point inter-arm difference. Caution advised due to dizziness history. Current medications effective. - Monitor BP at home. - Recheck BP in both arms. - Continue carvedilol, lisinopril,  spironolactone.  # Hyperlipidemia LDL 61 in January, well-controlled. On atorvastatin. - Continue atorvastatin.   Dispo: f/u in 6 months  Signed, Chilton Si, MD

## 2023-04-17 NOTE — Patient Instructions (Signed)
 Medication Instructions:  Your physician recommends that you continue on your current medications as directed. Please refer to the Current Medication list given to you today.   Testing/Procedures: Your physician has requested that you have an echocardiogram. Echocardiography is a painless test that uses sound waves to create images of your heart. It provides your doctor with information about the size and shape of your heart and how well your heart's chambers and valves are working. This procedure takes approximately one hour. There are no restrictions for this procedure. Please do NOT wear cologne, perfume, aftershave, or lotions (deodorant is allowed). Please arrive 15 minutes prior to your appointment time.  Please note: We ask at that you not bring children with you during ultrasound (echo/ vascular) testing. Due to room size and safety concerns, children are not allowed in the ultrasound rooms during exams. Our front office staff cannot provide observation of children in our lobby area while testing is being conducted. An adult accompanying a patient to their appointment will only be allowed in the ultrasound room at the discretion of the ultrasound technician under special circumstances. We apologize for any inconvenience.     Please report to Radiology at the [facility] Main Entrance 30 minutes early for your test.  [address] [place], [place] [address]                         OR   Please report to Radiology at [facility] Main Entrance, medical mall, 30 mins prior to your test.  [address]  [place], [place]  How to Prepare for Your Cardiac PET/CT Stress Test:  Nothing to eat or drink, except water, 3 hours prior to arrival time.  NO caffeine/decaffeinated products, or chocolate 12 hours prior to arrival. (Please note decaffeinated beverages (teas/coffees) still contain caffeine).  If you have caffeine within 12 hours prior, the test  will need to be rescheduled.  Medication instructions: Do not take erectile dysfunction medications for 72 hours prior to test (sildenafil, tadalafil) Do not take nitrates (isosorbide mononitrate, Ranexa) the day before or day of test Do not take tamsulosin the day before or morning of test Hold theophylline containing medications for 12 hours. Hold Dipyridamole 48 hours prior to the test.  Diabetic Preparation: If able to eat breakfast prior to 3 hour fasting, you may take all medications, including your insulin. Do not worry if you miss your breakfast dose of insulin - start at your next meal. If you do not eat prior to 3 hour fast-Hold all diabetes (oral and insulin) medications. Patients who wear a continuous glucose monitor MUST remove the device prior to scanning.  You may take your remaining medications with water.  NO perfume, cologne or lotion on chest or abdomen area. FEMALES - Please avoid wearing dresses to this appointment.  Total time is 1 to 2 hours; you may want to bring reading material for the waiting time.  IF YOU THINK YOU MAY BE PREGNANT, OR ARE NURSING PLEASE INFORM THE TECHNOLOGIST.  In preparation for your appointment, medication and supplies will be purchased.  Appointment availability is limited, so if you need to cancel or reschedule, please call the Radiology Department Scheduler at [phone] 24 hours in advance to avoid a cancellation fee of $100.00  What to Expect When you Arrive:  Once you arrive and check in for your appointment, you will be taken to a preparation room within the Radiology Department.  A technologist or Nurse will obtain your medical history, verify that you are correctly prepped for the exam, and explain the procedure.  Afterwards, an IV will be started in your arm and electrodes will be placed on your skin for EKG monitoring during the stress portion of the exam. Then you will be escorted to the PET/CT scanner.  There, staff will get  you positioned on the scanner and obtain a blood pressure and EKG.  During the exam, you will continue to be connected to the EKG and blood pressure machines.  A small, safe amount of a radioactive tracer will be injected in your IV to obtain a series of pictures of your heart along with an injection of a stress agent.    After your Exam:  It is recommended that you eat a meal and drink a caffeinated beverage to counter act any effects of the stress agent.  Drink plenty of fluids for the remainder of the day and urinate frequently for the first couple of hours after the exam.  Your doctor will inform you of your test results within 7-10 business days.  For more information and frequently asked questions, please visit our website: [email or web address]  For questions about your test or how to prepare for your test, please call: Cardiac Imaging Nurse Navigators Office: [phone]    Follow-Up: At [facility], you and your health needs are our priority.  As part of our continuing mission to provide you with exceptional heart care, we have created designated Provider Care Teams.  These Care Teams include your primary Cardiologist (physician) and Advanced Practice Providers (APPs -  Physician Assistants and Nurse Practitioners) who all work together to provide you with the care you need, when you need it.  We recommend signing up for the patient portal called "MyChart".  Sign up information is provided on this After Visit Summary.  MyChart is used to connect with patients for Virtual Visits (Telemedicine).  Patients are able to view lab/test results, encounter notes, upcoming appointments, etc.  Non-urgent messages can be sent to your provider as well.   To learn more about what you can do with MyChart, go to [email or web address].    Your next appointment:   6 months with Dr. Duke Salvia

## 2023-04-21 ENCOUNTER — Other Ambulatory Visit (HOSPITAL_BASED_OUTPATIENT_CLINIC_OR_DEPARTMENT_OTHER): Payer: Self-pay | Admitting: Cardiovascular Disease

## 2023-04-21 ENCOUNTER — Other Ambulatory Visit: Payer: Self-pay

## 2023-04-21 ENCOUNTER — Other Ambulatory Visit (HOSPITAL_COMMUNITY): Payer: Self-pay

## 2023-04-21 MED ORDER — CARVEDILOL 6.25 MG PO TABS
6.2500 mg | ORAL_TABLET | Freq: Two times a day (BID) | ORAL | 1 refills | Status: DC
  Filled 2023-04-21: qty 180, 90d supply, fill #0
  Filled 2023-07-30: qty 180, 90d supply, fill #1

## 2023-04-21 MED ORDER — SPIRONOLACTONE 25 MG PO TABS
25.0000 mg | ORAL_TABLET | Freq: Every day | ORAL | 3 refills | Status: DC
  Filled 2023-04-21: qty 90, 90d supply, fill #0

## 2023-04-21 MED ORDER — ACYCLOVIR 400 MG PO TABS
400.0000 mg | ORAL_TABLET | Freq: Every day | ORAL | 3 refills | Status: AC
  Filled 2023-04-21: qty 90, 90d supply, fill #0
  Filled 2023-07-30: qty 90, 90d supply, fill #1
  Filled 2023-10-28: qty 90, 90d supply, fill #2
  Filled 2024-01-25: qty 90, 90d supply, fill #3

## 2023-04-22 ENCOUNTER — Other Ambulatory Visit: Payer: Self-pay

## 2023-04-22 DIAGNOSIS — D225 Melanocytic nevi of trunk: Secondary | ICD-10-CM | POA: Diagnosis not present

## 2023-04-22 DIAGNOSIS — Z08 Encounter for follow-up examination after completed treatment for malignant neoplasm: Secondary | ICD-10-CM | POA: Diagnosis not present

## 2023-04-22 DIAGNOSIS — Z789 Other specified health status: Secondary | ICD-10-CM | POA: Diagnosis not present

## 2023-04-22 DIAGNOSIS — L2989 Other pruritus: Secondary | ICD-10-CM | POA: Diagnosis not present

## 2023-04-22 DIAGNOSIS — R208 Other disturbances of skin sensation: Secondary | ICD-10-CM | POA: Diagnosis not present

## 2023-04-22 DIAGNOSIS — Z872 Personal history of diseases of the skin and subcutaneous tissue: Secondary | ICD-10-CM | POA: Diagnosis not present

## 2023-04-22 DIAGNOSIS — Z85828 Personal history of other malignant neoplasm of skin: Secondary | ICD-10-CM | POA: Diagnosis not present

## 2023-04-22 DIAGNOSIS — L820 Inflamed seborrheic keratosis: Secondary | ICD-10-CM | POA: Diagnosis not present

## 2023-04-22 DIAGNOSIS — L821 Other seborrheic keratosis: Secondary | ICD-10-CM | POA: Diagnosis not present

## 2023-04-22 DIAGNOSIS — L814 Other melanin hyperpigmentation: Secondary | ICD-10-CM | POA: Diagnosis not present

## 2023-04-22 DIAGNOSIS — L538 Other specified erythematous conditions: Secondary | ICD-10-CM | POA: Diagnosis not present

## 2023-05-08 ENCOUNTER — Ambulatory Visit (INDEPENDENT_AMBULATORY_CARE_PROVIDER_SITE_OTHER)

## 2023-05-08 DIAGNOSIS — I10 Essential (primary) hypertension: Secondary | ICD-10-CM

## 2023-05-08 DIAGNOSIS — E7800 Pure hypercholesterolemia, unspecified: Secondary | ICD-10-CM | POA: Diagnosis not present

## 2023-05-08 DIAGNOSIS — R55 Syncope and collapse: Secondary | ICD-10-CM | POA: Diagnosis not present

## 2023-05-08 DIAGNOSIS — Z953 Presence of xenogenic heart valve: Secondary | ICD-10-CM

## 2023-05-08 DIAGNOSIS — I25110 Atherosclerotic heart disease of native coronary artery with unstable angina pectoris: Secondary | ICD-10-CM

## 2023-05-08 DIAGNOSIS — I951 Orthostatic hypotension: Secondary | ICD-10-CM

## 2023-05-08 DIAGNOSIS — Z951 Presence of aortocoronary bypass graft: Secondary | ICD-10-CM | POA: Diagnosis not present

## 2023-05-08 LAB — ECHOCARDIOGRAM COMPLETE
AR max vel: 1.02 cm2
AV Area VTI: 1.03 cm2
AV Area mean vel: 0.96 cm2
AV Mean grad: 13.5 mmHg
AV Peak grad: 23.9 mmHg
Ao pk vel: 2.45 m/s
Area-P 1/2: 2.32 cm2
MV M vel: 5.54 m/s
MV Peak grad: 122.8 mmHg
S' Lateral: 3.31 cm

## 2023-05-12 DIAGNOSIS — H401123 Primary open-angle glaucoma, left eye, severe stage: Secondary | ICD-10-CM | POA: Diagnosis not present

## 2023-05-12 DIAGNOSIS — H40021 Open angle with borderline findings, high risk, right eye: Secondary | ICD-10-CM | POA: Diagnosis not present

## 2023-05-12 DIAGNOSIS — Z961 Presence of intraocular lens: Secondary | ICD-10-CM | POA: Diagnosis not present

## 2023-05-12 DIAGNOSIS — H04122 Dry eye syndrome of left lacrimal gland: Secondary | ICD-10-CM | POA: Diagnosis not present

## 2023-05-19 ENCOUNTER — Telehealth: Payer: Self-pay | Admitting: Cardiovascular Disease

## 2023-05-19 NOTE — Telephone Encounter (Signed)
 patient really want to speak with a nurse about echo results....says that no one has contacted them and he had it done on 4/17

## 2023-05-19 NOTE — Telephone Encounter (Signed)
 Called patient back to let him know Dr. Theodis Fiscal will need to review the report and advise if needed. Will send message to Dr. Theodis Fiscal and her nurse.

## 2023-05-22 NOTE — Telephone Encounter (Signed)
 Late entry Printed 4/28 for Dr Theodis Fiscal to review when back in the office

## 2023-06-02 NOTE — Telephone Encounter (Signed)
-----   Message from [facility] sent at 05/31/2023  9:30 PM EDT ----- Echo looks good.  Ejection fraction is normal and replaced aortic valve is functioning well.

## 2023-06-02 NOTE — Telephone Encounter (Signed)
Advised wife, ok per DPR  

## 2023-06-18 ENCOUNTER — Other Ambulatory Visit (HOSPITAL_COMMUNITY): Payer: Self-pay

## 2023-07-07 ENCOUNTER — Telehealth (HOSPITAL_COMMUNITY): Payer: Self-pay | Admitting: *Deleted

## 2023-07-07 ENCOUNTER — Other Ambulatory Visit (HOSPITAL_COMMUNITY): Payer: Self-pay | Admitting: *Deleted

## 2023-07-07 DIAGNOSIS — R0602 Shortness of breath: Secondary | ICD-10-CM

## 2023-07-07 DIAGNOSIS — I2510 Atherosclerotic heart disease of native coronary artery without angina pectoris: Secondary | ICD-10-CM

## 2023-07-07 NOTE — Telephone Encounter (Signed)
Attempted to call patient regarding upcoming cardiac PET appointment. Left message on voicemail with name and callback number  Larey Brick RN Navigator Cardiac Imaging [facility] [phone] Office [phone] Cell  Reminder to avoid caffeine 12 hours prior to her cardiac PET appt.

## 2023-07-08 ENCOUNTER — Encounter (HOSPITAL_COMMUNITY): Payer: Self-pay

## 2023-07-08 ENCOUNTER — Ambulatory Visit: Payer: Self-pay | Admitting: Cardiovascular Disease

## 2023-07-08 ENCOUNTER — Other Ambulatory Visit: Payer: Self-pay

## 2023-07-08 ENCOUNTER — Emergency Department (HOSPITAL_COMMUNITY)
Admission: EM | Admit: 2023-07-08 | Discharge: 2023-07-08 | Disposition: A | Discharge status: Home / Self Care | Attending: Emergency Medicine | Admitting: Emergency Medicine

## 2023-07-08 ENCOUNTER — Encounter (HOSPITAL_BASED_OUTPATIENT_CLINIC_OR_DEPARTMENT_OTHER)
Admission: RE | Admit: 2023-07-08 | Discharge: 2023-07-08 | Disposition: A | Discharge status: Home / Self Care | Source: Ambulatory Visit | Attending: Cardiovascular Disease | Admitting: Cardiovascular Disease

## 2023-07-08 ENCOUNTER — Emergency Department (HOSPITAL_COMMUNITY)

## 2023-07-08 DIAGNOSIS — E7800 Pure hypercholesterolemia, unspecified: Secondary | ICD-10-CM

## 2023-07-08 DIAGNOSIS — I951 Orthostatic hypotension: Secondary | ICD-10-CM

## 2023-07-08 DIAGNOSIS — Z79899 Other long term (current) drug therapy: Secondary | ICD-10-CM | POA: Diagnosis not present

## 2023-07-08 DIAGNOSIS — R0602 Shortness of breath: Secondary | ICD-10-CM | POA: Diagnosis not present

## 2023-07-08 DIAGNOSIS — I10 Essential (primary) hypertension: Secondary | ICD-10-CM | POA: Insufficient documentation

## 2023-07-08 DIAGNOSIS — T50905A Adverse effect of unspecified drugs, medicaments and biological substances, initial encounter: Secondary | ICD-10-CM

## 2023-07-08 DIAGNOSIS — R55 Syncope and collapse: Secondary | ICD-10-CM | POA: Diagnosis not present

## 2023-07-08 DIAGNOSIS — Z7982 Long term (current) use of aspirin: Secondary | ICD-10-CM | POA: Insufficient documentation

## 2023-07-08 DIAGNOSIS — Z953 Presence of xenogenic heart valve: Secondary | ICD-10-CM | POA: Diagnosis not present

## 2023-07-08 DIAGNOSIS — Z951 Presence of aortocoronary bypass graft: Secondary | ICD-10-CM | POA: Diagnosis not present

## 2023-07-08 DIAGNOSIS — R001 Bradycardia, unspecified: Secondary | ICD-10-CM | POA: Diagnosis not present

## 2023-07-08 DIAGNOSIS — I959 Hypotension, unspecified: Secondary | ICD-10-CM | POA: Insufficient documentation

## 2023-07-08 DIAGNOSIS — J984 Other disorders of lung: Secondary | ICD-10-CM | POA: Diagnosis not present

## 2023-07-08 DIAGNOSIS — T463X5A Adverse effect of coronary vasodilators, initial encounter: Secondary | ICD-10-CM | POA: Insufficient documentation

## 2023-07-08 DIAGNOSIS — R42 Dizziness and giddiness: Secondary | ICD-10-CM | POA: Diagnosis not present

## 2023-07-08 DIAGNOSIS — I25110 Atherosclerotic heart disease of native coronary artery with unstable angina pectoris: Secondary | ICD-10-CM | POA: Diagnosis not present

## 2023-07-08 DIAGNOSIS — R9389 Abnormal findings on diagnostic imaging of other specified body structures: Secondary | ICD-10-CM | POA: Diagnosis not present

## 2023-07-08 LAB — CBC WITH DIFFERENTIAL/PLATELET
Abs Immature Granulocytes: 0.01 10*3/uL (ref 0.00–0.07)
Basophils Absolute: 0 10*3/uL (ref 0.0–0.1)
Basophils Relative: 0 %
Eosinophils Absolute: 0.3 10*3/uL (ref 0.0–0.5)
Eosinophils Relative: 4 %
HCT: 37.9 % — ABNORMAL LOW (ref 39.0–52.0)
Hemoglobin: 12.8 g/dL — ABNORMAL LOW (ref 13.0–17.0)
Immature Granulocytes: 0 %
Lymphocytes Relative: 19 %
Lymphs Abs: 1.3 10*3/uL (ref 0.7–4.0)
MCH: 33.1 pg (ref 26.0–34.0)
MCHC: 33.8 g/dL (ref 30.0–36.0)
MCV: 97.9 fL (ref 80.0–100.0)
Monocytes Absolute: 0.6 10*3/uL (ref 0.1–1.0)
Monocytes Relative: 9 %
Neutro Abs: 4.7 10*3/uL (ref 1.7–7.7)
Neutrophils Relative %: 68 %
Platelets: 173 10*3/uL (ref 150–400)
RBC: 3.87 MIL/uL — ABNORMAL LOW (ref 4.22–5.81)
RDW: 12.1 % (ref 11.5–15.5)
WBC: 6.9 10*3/uL (ref 4.0–10.5)
nRBC: 0 % (ref 0.0–0.2)

## 2023-07-08 LAB — BASIC METABOLIC PANEL WITH GFR
Anion gap: 10 (ref 5–15)
BUN: 29 mg/dL — ABNORMAL HIGH (ref 8–23)
CO2: 19 mmol/L — ABNORMAL LOW (ref 22–32)
Calcium: 9.3 mg/dL (ref 8.9–10.3)
Chloride: 107 mmol/L (ref 98–111)
Creatinine, Ser: 1.49 mg/dL — ABNORMAL HIGH (ref 0.61–1.24)
GFR, Estimated: 46 mL/min — ABNORMAL LOW (ref >60)
Glucose, Bld: 144 mg/dL — ABNORMAL HIGH (ref 70–99)
Potassium: 4.3 mmol/L (ref 3.5–5.1)
Sodium: 136 mmol/L (ref 135–145)

## 2023-07-08 LAB — NM PET CT CARDIAC PERFUSION MULTI W/ABSOLUTE BLOODFLOW
LV dias vol: 83 mL (ref 62–150)
LV sys vol: 51 mL (ref 4.2–5.8)
MBFR: 1.35
Nuc Rest EF: 39 %
Rest MBF: 0.83 ml/g/min
Rest Nuclear Isotope Dose: 20.9 mCi
ST Depression (mm): 0 mm
Stress MBF: 1.12 ml/g/min
Stress Nuclear Isotope Dose: 20.9 mCi

## 2023-07-08 MED ORDER — DEXTROSE 5 % IV SOLN
60.0000 mg | Freq: Once | INTRAVENOUS | Status: AC
  Administered 2023-07-08: 60 mg via INTRAVENOUS

## 2023-07-08 MED ORDER — RUBIDIUM RB82 GENERATOR (RUBYFILL)
20.9100 | PACK | Freq: Once | INTRAVENOUS | Status: AC
  Administered 2023-07-08: 20.91 via INTRAVENOUS

## 2023-07-08 MED ORDER — REGADENOSON 0.4 MG/5ML IV SOLN
0.4000 mg | Freq: Once | INTRAVENOUS | Status: AC
  Administered 2023-07-08: 0.4 mg via INTRAVENOUS

## 2023-07-08 MED ORDER — CAFFEINE CITRATE BASE COMPONENT 10 MG/ML IV SOLN
INTRAVENOUS | Status: AC
  Filled 2023-07-08: qty 3

## 2023-07-08 MED ORDER — SODIUM CHLORIDE 0.9 % IV BOLUS
1000.0000 mL | Freq: Once | INTRAVENOUS | Status: AC
  Administered 2023-07-08: 1000 mL via INTRAVENOUS

## 2023-07-08 MED ORDER — LACTATED RINGERS IV BOLUS
1000.0000 mL | Freq: Once | INTRAVENOUS | Status: AC
  Administered 2023-07-08: 1000 mL via INTRAVENOUS

## 2023-07-08 MED ORDER — REGADENOSON 0.4 MG/5ML IV SOLN
INTRAVENOUS | Status: AC
  Filled 2023-07-08: qty 5

## 2023-07-08 MED ORDER — DEXTROSE 5 % IV SOLN
INTRAVENOUS | Status: AC
Start: 2023-07-08 — End: 2023-07-08
  Filled 2023-07-08: qty 50

## 2023-07-08 MED ORDER — RUBIDIUM RB82 GENERATOR (RUBYFILL)
20.9000 | PACK | Freq: Once | INTRAVENOUS | Status: AC
Start: 2023-07-08 — End: 2023-07-08
  Administered 2023-07-08: 20.9 via INTRAVENOUS

## 2023-07-08 NOTE — Discharge Instructions (Signed)
 You were seen for your medication reaction in the emergency department.   Follow-up with your primary doctor in 2-3 days regarding your visit.  Talk to your cardiologist as well.  Return immediately to the emergency department if you experience any of the following: Fainting, lightheadedness, or any other concerning symptoms.    Thank you for visiting our Emergency Department. It was a pleasure taking care of you today.

## 2023-07-08 NOTE — ED Notes (Signed)
 Patient ambulated to the restroom without difficulty, no complaints.

## 2023-07-08 NOTE — ED Provider Notes (Signed)
 [place] EMERGENCY DEPARTMENT AT [facility] Provider Note   CSN: [id number] Arrival date & time: 07/08/23  1056     Patient presents with: Hypotension   [name] [name] is a 83 y.o. male.   83 year old male with a history of quadruple bypass status post CABG and hypertension presents to the emergency department with hypotension.  Patient was reportedly getting a nuclear medicine stress test when he shortly after receiving the Regadenoson  he got nauseous and lightheaded and passed out briefly.  He was noted to be hypotensive.  Given caffeine and was brought to the emergency department for evaluation.  Says he is feeling somewhat better at this time.  No chest pain or shortness of breath.  Vital signs were normal prior to the procedure and he said that he felt fine prior to this.       Prior to Admission medications   Medication Sig Start Date End Date Taking? Authorizing Provider  acetaminophen  (TYLENOL ) 500 MG tablet Take 1,000 mg by mouth every 6 (six) hours as needed for headache (pain).     [provider]  acyclovir  (ZOVIRAX ) 400 MG tablet Take 1 tablet (400 mg total) by mouth daily. 04/21/23     amoxicillin  (AMOXIL ) 500 MG tablet TAKE 4 TABLETS 1 HOUR PRIOR TO PROCEDURES 11/01/21   Maudine Sos, MD  aspirin  EC 81 MG tablet Take 1 tablet (81 mg total) by mouth daily. Swallow whole. 02/27/22   Maudine Sos, MD  atorvastatin  (LIPITOR ) 40 MG tablet Take 1 tablet (40 mg total) by mouth daily. 10/08/22   Maudine Sos, MD  carvedilol  (COREG ) 6.25 MG tablet Take 1 tablet (6.25 mg total) by mouth 2 (two) times daily. 04/21/23   Maudine Sos, MD  esomeprazole (NEXIUM) 20 MG capsule Take 20 mg by mouth every other day. Reported on 03/01/2015    [provider]  lisinopril  (ZESTRIL ) 20 MG tablet Take 1 tablet (20 mg total) by mouth 2 (two) times daily. 10/17/22   Clearnce Curia, NP  meloxicam  (MOBIC ) 15 MG tablet Take 1 tablet (15 mg total) by mouth  daily. 03/18/23     methimazole  (TAPAZOLE ) 5 MG tablet Take 1 tablet (5 mg total) by mouth daily. 01/20/23     oxybutynin  (DITROPAN ) 5 MG tablet Take 1 tablet (5 mg total) by mouth 2 (two) times daily. 03/19/23     prednisoLONE  acetate (PRED FORTE ) 1 % ophthalmic suspension Place 1 drop into the left eye daily.    [provider]  spironolactone  (ALDACTONE ) 25 MG tablet Take 1 tablet (25 mg total) by mouth daily. Please schedule appointment for refills. 04/21/23   Maudine Sos, MD  timolol  (TIMOPTIC ) 0.5 % ophthalmic solution Place 1 drop into the left eye 2 (two) times daily. 02/26/22       Allergies: Hydrochlorothiazide and Regadenoson     Review of Systems  Updated Vital Signs BP 133/79   Pulse (!) 58   Temp 98.4 F (36.9 C) (Oral)   Resp 13   Ht 6' 1 (1.854 m)   Wt 78.8 kg   SpO2 99%   BMI 22.92 kg/m   Physical Exam Vitals and nursing note reviewed.  Constitutional:      General: He is not in acute distress.    Appearance: He is well-developed.  HENT:     Head: Normocephalic and atraumatic.     Right Ear: External ear normal.     Left Ear: External ear normal.     Nose: Nose normal.  Eyes:     Extraocular Movements: Extraocular movements intact.     Conjunctiva/sclera: Conjunctivae normal.     Pupils: Pupils are equal, round, and reactive to light.    Cardiovascular:     Rate and Rhythm: Regular rhythm. Bradycardia present.     Heart sounds: Normal heart sounds.  Pulmonary:     Effort: Pulmonary effort is normal. No respiratory distress.     Breath sounds: Normal breath sounds.   Musculoskeletal:     Cervical back: Normal range of motion and neck supple.     Right lower leg: No edema.     Left lower leg: No edema.   Skin:    General: Skin is warm and dry.   Neurological:     Mental Status: He is alert. Mental status is at baseline.   Psychiatric:        Mood and Affect: Mood normal.        Behavior: Behavior normal.     (all labs ordered  are listed, but only abnormal results are displayed) Labs Reviewed  CBC WITH DIFFERENTIAL/PLATELET - Abnormal; Notable for the following components:      Result Value   RBC 3.87 (*)    Hemoglobin 12.8 (*)    HCT 37.9 (*)    All other components within normal limits  BASIC METABOLIC PANEL WITH GFR - Abnormal; Notable for the following components:   CO2 19 (*)    Glucose, Bld 144 (*)    BUN 29 (*)    Creatinine, Ser 1.49 (*)    GFR, Estimated 46 (*)    All other components within normal limits    EKG: None  Radiology: NM PET CT CARDIAC PERFUSION MULTI W/ABSOLUTE BLOODFLOW Result Date: 07/08/2023   LV perfusion is normal. There is no evidence of ischemia. There is no evidence of infarction.   Rest left ventricular function is abnormal. Rest global function is moderately reduced. There were no regional wall motion abnormalities. Rest EF: 39%. End diastolic cavity size is normal. End systolic cavity size is mildly enlarged.   Myocardial blood flow was computed to be 0.37ml/g/min at rest and 1.3ml/g/min at stress. Global myocardial blood flow reserve was 1.35 and was abnormal.   Coronary calcium  assessment not performed due to prior revascularization. duet to prior CABG   Findings are consistent with no ischemia and no infarction. The study is low risk.   Normal perfusion. Stress EF not sent. Abnormal resting EF no RWMA suggest echo correlation. Abnormal MBFR in setting of prior CABG  Note patient has bradycardia and hypotension post procedure Caffeine given with saline and sent to [facility] ER EXAM: OVER-READ INTERPRETATION  CT CHEST The following report is a limited chest CT over-read performed by radiologist Dr. Marcos Sevin [facility] on 07/08/2023. This over-read does not include interpretation of cardiac or coronary anatomy or pathology nor does it include evaluation of the PET data. The cardiac PET-CT interpretation by the cardiologist is attached. COMPARISON:  Chest radiographs  10/27/2016.  Abdominal CT 06/13/2005. FINDINGS: Mediastinum/Nodes: No enlarged lymph nodes within the visualized mediastinum.Previous median sternotomy. Aortic and coronary artery atherosclerosis with an aortic valve replacement. Lungs/Pleura: There is no pleural effusion. Chronic elevation of the left hemidiaphragm with associated left basilar atelectasis or scarring. The lungs otherwise appear clear. Upper abdomen: No significant findings within the visualized upper abdomen. There are small cysts in the upper pole of the left kidney. There is a peripherally calcified cystic lesion in the upper pole of the  right kidney which has decreased in size from the remote abdominal CT. No specific follow-up imaging is recommended. Musculoskeletal/Chest wall: No chest wall mass or suspicious osseous findings within the visualized chest. Bilateral gynecomastia noted. IMPRESSION: 1. No significant extracardiac findings. 2. Chronic elevation of the left hemidiaphragm with associated left basilar atelectasis or scarring. 3.  Aortic Atherosclerosis (ICD10-I70.0). Electronically Signed   By: Elmon Hagedorn M.D.   On: 07/08/2023 11:49  DG Chest Portable 1 View Result Date: 07/08/2023 CLINICAL DATA:  Shortness of breath.  Lightheadedness. EXAM: PORTABLE CHEST 1 VIEW COMPARISON:  Radiographs 10/27/2016.  Same day cardiac PET-CT. FINDINGS: 1115 hours. The heart size and mediastinal contours are stable post median sternotomy and aortic valve replacement. There is chronic elevation of the left hemidiaphragm with associated left basilar scarring. The right lung appears clear. No evidence of pleural effusion or pneumothorax. The bones appear unremarkable. IMPRESSION: No evidence of acute cardiopulmonary process. Chronic elevation of the left hemidiaphragm with associated left basilar scarring. Electronically Signed   By: Elmon Hagedorn M.D.   On: 07/08/2023 11:50     Procedures   Medications Ordered in the ED  lactated ringers   bolus 1,000 mL (0 mLs Intravenous Stopped 07/08/23 1230)    Clinical Course as of 07/08/23 1429  Tue Jul 08, 2023  1147 Creatinine(!): 1.49 Baseline of 1.4 [RP]    Clinical Course User Index [RP] Ninetta Basket, MD                                 Medical Decision Making Amount and/or Complexity of Data Reviewed Labs: ordered. Decision-making details documented in ED Course. Radiology: ordered.   83 year old male with a history of quadruple bypass status post CABG and hypertension presents to the emergency department with hypotension.   Initial Ddx:  Symptomatic bradycardia, medication reaction, anaphylaxis, hypovolemia, syncope  MDM/Course:  Patient presents emergency department with a syncopal event and hypotension after being given Regadenoson .  Was given caffeine just prior to arriving.  Initially was hypotensive and was given some IV fluids.  Was monitored and upon re-evaluation blood pressure improved.  He remained stable during his stay in the emergency department.  Was able to tolerate p.o. and was able to get up and walk around without any lightheadedness or orthostasis.  Suspect this was a reaction to the medication.  She is not complaining of any other concerning symptoms at this time so we will have him follow-up with his primary doctor and cardiology in several days.  This patient presents to the ED for concern of complaints listed in HPI, this involves an extensive number of treatment options, and is a complaint that carries with it a high risk of complications and morbidity. Disposition including potential need for admission considered.   Dispo: DC Home. Return precautions discussed including, but not limited to, those listed in the AVS. Allowed pt time to ask questions which were answered fully prior to dc.  Additional history obtained from spouse Records reviewed Outpatient Clinic Notes The following labs were independently interpreted: Chemistry and show CKD I  independently reviewed the following imaging with scope of interpretation limited to determining acute life threatening conditions related to emergency care: Chest x-ray and agree with the radiologist interpretation with the following exceptions: none I personally reviewed and interpreted cardiac monitoring: Sinus bradycardia I personally reviewed and interpreted the pt's EKG: see above for interpretation  I have reviewed the patients home medications  and made adjustments as needed Social Determinants of health:  Geriatric  Portions of this note were generated with Scientist, clinical (histocompatibility and immunogenetics). Dictation errors may occur despite best attempts at proofreading.     Final diagnoses:  Hypotension, unspecified hypotension type  Adverse effect of drug, initial encounter    ED Discharge Orders     None          Ninetta Basket, MD 07/08/23 1429

## 2023-07-08 NOTE — ED Notes (Signed)
 Patient provided sandwich and juice, tolerating PO intake well.

## 2023-07-08 NOTE — Progress Notes (Signed)
 BP was not correlating with pt. Heart rate at the time pt. Was asymptomatic and said he felt fine

## 2023-07-08 NOTE — ED Triage Notes (Signed)
 Pt brought over from radiology. Pt reported feeling light headed and passed out when they sat him up. Pt BP was 61/ and 67/ upon two BP checks. Rapid response called.

## 2023-07-08 NOTE — Progress Notes (Signed)
 Pt. Came in today for a Lexi scan (cardiac perfusion test) in [place]. Pt. Was alert and oriented x 4 on first assessment. Pt. Was put on the PET CT Scanner and connected to a 12 lead EKG. Pt. Received the first half of his scan and did great. RN Matina Rodier administered 0.4 mg regadenoson  for the Lexi scan. Pt. Was alerted of the possible immediate side effects including SOB, chest pain, stomach pain, and headache. Pt. Was told these would start quickly and usually end quickly. Pt. Understood and medication was administered at 1030. Pt. HR elevated from 57 to 71 and BP dropped to 75/43. These findings during the scan are not abnormal. Rn Shahrukh Pasch checked on the pt. To see if he was feeling okay the pt. Stated I feel fine, just a little tired. RN assured the pt. And let him know that the scan had about 6 more minutes. In the next minute pt. Heart rate dropped to 40 and stayed anywhere between 35 and 40's . At this point we were unable to obtain a BP due to air leakage in the cuff. Pt. Remained stable and said he felt okay. Rn stayed at the head of the bed with him until the scan was through. RN and [place] Tech [place] helped the pt. Sit up and start drinking some Diet Coke for the caffeine to help reverse the effects of the regadenoson . At this point the BP was reading high at 197/51 @ 1036  and HR remained in the 40's. Pt. Once again stated he felt fine. Rn Nehal Witting did not like the BP and the HR not seeming to correlate. [place] NM tech went to get another vital sign machine while doing so she walked back in as the pt. Was passing out. RN Albana Saperstein caught the pt. And we assisted him to laying back down on the scanner instead of sitting on the edge. The pt. Quickly became responsive again. At this point his BP came back at 80/60. Pt. Was obviously symptomatic at this point. RN Taelyn Nemes administered 60 mg of caffeine citrate mixed in a 100 ml 5% dextrose  bag at 1040. RN Linden Mikes also started a saline bolus at 1040.  During this time Rapid Response was also called and the cardiologist was called, Dr. Stann Earnest to make him aware of the emergency and let him know what I had given and asked if he had any other suggestions other than transporting the pt. To the ED. Dr. Nishan stated that the pt. Should be transported and that he would read the scan. RN Rapid response Felipa Horsfall alerted the ED and they quickly arrived with a stretcher. Pt. Was quickly transported to the ED where RN Maysun Meditz alerted the ED doctor of medications given. At this point the pt. Seemed to be coming around. BP was going up and he was less pale. Rn made sure that the ED team had all of their questions answered and new the pt.'s wife was in the waiting room before returned to the radiology department. RN will place Regadenoson  as a possible intolerance due to today's response.

## 2023-07-09 ENCOUNTER — Encounter (HOSPITAL_BASED_OUTPATIENT_CLINIC_OR_DEPARTMENT_OTHER): Payer: Self-pay | Admitting: *Deleted

## 2023-07-09 NOTE — Telephone Encounter (Signed)
-----   Message from [facility] sent at 07/08/2023  6:39 PM EDT ----- Stress test was low risk.  Your heart is getting good blood flow.  ----- Message ----- From: Interface, Rad Results In Sent: 07/08/2023  11:51 AM EDT To: Maudine Sos, MD

## 2023-07-09 NOTE — Telephone Encounter (Signed)
 Spoke with wife regarding  This encounter was created in error - please disregard.

## 2023-07-09 NOTE — Telephone Encounter (Signed)
 Reviewed results with wife  She wanted to make sure Dr Theodis Fiscal was aware he had to go to ED after study for hypotension  This am about an hour after taking medications blood pressure was 122/69 HR 60 Yesterday blood pressure 98/57, 2 hours later 115/64 HR 64  Will forward to Dr Theodis Fiscal for review

## 2023-07-11 ENCOUNTER — Other Ambulatory Visit (HOSPITAL_COMMUNITY): Payer: Self-pay

## 2023-07-12 ENCOUNTER — Other Ambulatory Visit (HOSPITAL_COMMUNITY): Payer: Self-pay

## 2023-07-14 ENCOUNTER — Other Ambulatory Visit (HOSPITAL_COMMUNITY): Payer: Self-pay

## 2023-07-14 MED ORDER — TIMOLOL MALEATE 0.5 % OP SOLN
1.0000 [drp] | Freq: Two times a day (BID) | OPHTHALMIC | 10 refills | Status: AC
  Filled 2023-07-14: qty 15, 150d supply, fill #0
  Filled 2023-07-17: qty 10, 100d supply, fill #0
  Filled 2023-11-05: qty 10, 100d supply, fill #1

## 2023-07-17 ENCOUNTER — Other Ambulatory Visit (HOSPITAL_COMMUNITY): Payer: Self-pay

## 2023-07-23 NOTE — Telephone Encounter (Signed)
 Spoke with wife and since spoke last blood pressure readings below 6/17 that evening 98/57 and later 115/64 6/18 122/68 after meds, 1pm 109/63 6/19 134/74 later 117/65 6/28 124/72  This is while still taking the Spironolactone    Will forward to Dr Raford for review

## 2023-07-30 ENCOUNTER — Other Ambulatory Visit (HOSPITAL_COMMUNITY): Payer: Self-pay

## 2023-07-30 ENCOUNTER — Other Ambulatory Visit: Payer: Self-pay

## 2023-07-30 MED ORDER — SPIRONOLACTONE 25 MG PO TABS: 12.5000 mg | ORAL_TABLET | Freq: Every day | ORAL | 3 refills | Status: DC

## 2023-07-30 MED ORDER — SPIRONOLACTONE 25 MG PO TABS
12.5000 mg | ORAL_TABLET | Freq: Every day | ORAL | 3 refills | Status: DC
  Filled 2023-07-30: qty 45, 90d supply, fill #0
  Filled 2023-12-10: qty 45, 90d supply, fill #1

## 2023-08-04 DIAGNOSIS — Z96652 Presence of left artificial knee joint: Secondary | ICD-10-CM | POA: Diagnosis not present

## 2023-08-04 DIAGNOSIS — M1711 Unilateral primary osteoarthritis, right knee: Secondary | ICD-10-CM | POA: Diagnosis not present

## 2023-08-12 DIAGNOSIS — N529 Male erectile dysfunction, unspecified: Secondary | ICD-10-CM | POA: Diagnosis not present

## 2023-08-12 DIAGNOSIS — I1310 Hypertensive heart and chronic kidney disease without heart failure, with stage 1 through stage 4 chronic kidney disease, or unspecified chronic kidney disease: Secondary | ICD-10-CM | POA: Diagnosis not present

## 2023-08-12 DIAGNOSIS — K219 Gastro-esophageal reflux disease without esophagitis: Secondary | ICD-10-CM | POA: Diagnosis not present

## 2023-08-12 DIAGNOSIS — I2510 Atherosclerotic heart disease of native coronary artery without angina pectoris: Secondary | ICD-10-CM | POA: Diagnosis not present

## 2023-08-12 DIAGNOSIS — Z1331 Encounter for screening for depression: Secondary | ICD-10-CM | POA: Diagnosis not present

## 2023-08-12 DIAGNOSIS — I5189 Other ill-defined heart diseases: Secondary | ICD-10-CM | POA: Diagnosis not present

## 2023-08-12 DIAGNOSIS — E0500 Thyrotoxicosis with diffuse goiter without thyrotoxic crisis or storm: Secondary | ICD-10-CM | POA: Diagnosis not present

## 2023-08-12 DIAGNOSIS — N182 Chronic kidney disease, stage 2 (mild): Secondary | ICD-10-CM | POA: Diagnosis not present

## 2023-08-12 DIAGNOSIS — I517 Cardiomegaly: Secondary | ICD-10-CM | POA: Diagnosis not present

## 2023-08-12 DIAGNOSIS — Z952 Presence of prosthetic heart valve: Secondary | ICD-10-CM | POA: Diagnosis not present

## 2023-08-12 DIAGNOSIS — Z0000 Encounter for general adult medical examination without abnormal findings: Secondary | ICD-10-CM | POA: Diagnosis not present

## 2023-08-12 DIAGNOSIS — E7800 Pure hypercholesterolemia, unspecified: Secondary | ICD-10-CM | POA: Diagnosis not present

## 2023-08-12 LAB — LAB REPORT - SCANNED
EGFR (Non-African Amer.): 55
TSH: 1.65

## 2023-08-17 ENCOUNTER — Ambulatory Visit: Payer: Self-pay | Admitting: Cardiovascular Disease

## 2023-09-15 ENCOUNTER — Other Ambulatory Visit (HOSPITAL_COMMUNITY): Payer: Self-pay

## 2023-09-15 ENCOUNTER — Other Ambulatory Visit: Payer: Self-pay

## 2023-09-15 MED ORDER — MELOXICAM 15 MG PO TABS
15.0000 mg | ORAL_TABLET | Freq: Every day | ORAL | 1 refills | Status: DC
  Filled 2023-09-15: qty 100, 100d supply, fill #0

## 2023-09-18 DIAGNOSIS — M1711 Unilateral primary osteoarthritis, right knee: Secondary | ICD-10-CM | POA: Diagnosis not present

## 2023-09-23 ENCOUNTER — Telehealth: Payer: Self-pay

## 2023-09-23 NOTE — Telephone Encounter (Signed)
 Preoperative team,  Patient has upcoming appointment with Dr. Raford on 10/17/2023.  Please add preoperative cardiac evaluation to appointment notes.  I will defer cardiac evaluation to upcoming appointment.  Thank you for your help.  [name] [name]. Kamil Hanigan NP-C     09/23/2023, 3:50 PM [facility] [address] [place], [place] [address] Office [phone]

## 2023-09-23 NOTE — Telephone Encounter (Signed)
   Pre-operative Risk Assessment    Patient Name: [name] [name]  DOB: October 06, 1940 MRN: [id number]   Date of last office visit: 04/17/23 ANNABELLA SCARCE, MD Date of next office visit: 10/17/23 [facility] [place], MD   Request for Surgical Clearance    Procedure:  RIGHT TOTAL KNEE ARTHROPLASTY  Date of Surgery:  Clearance 12/08/23                                Surgeon:  DR DEMPSEY MOAN Surgeon's Group or Practice Name:  [facility] Phone number:  [phone] Fax number:  [phone]  ATTN: KERRI MAZE   Type of Clearance Requested:   - Medical  - Pharmacy:  Hold Aspirin      Type of Anesthesia:  CHOICE   Additional requests/questions:    SignedLucie DELENA Ku   09/23/2023, 2:37 PM

## 2023-09-23 NOTE — Telephone Encounter (Signed)
 Patient has an upcoming appointment clearance can be addressed at office visit and note has been made to appointment line that preop clearance is needed

## 2023-10-04 DIAGNOSIS — Z23 Encounter for immunization: Secondary | ICD-10-CM | POA: Diagnosis not present

## 2023-10-17 ENCOUNTER — Encounter (HOSPITAL_BASED_OUTPATIENT_CLINIC_OR_DEPARTMENT_OTHER): Payer: Self-pay | Admitting: *Deleted

## 2023-10-17 ENCOUNTER — Ambulatory Visit (HOSPITAL_BASED_OUTPATIENT_CLINIC_OR_DEPARTMENT_OTHER): Admitting: Cardiovascular Disease

## 2023-10-17 ENCOUNTER — Encounter (HOSPITAL_BASED_OUTPATIENT_CLINIC_OR_DEPARTMENT_OTHER): Payer: Self-pay | Admitting: Cardiovascular Disease

## 2023-10-17 VITALS — BP 146/84 | HR 57 | Ht 73.0 in | Wt 171.2 lb

## 2023-10-17 DIAGNOSIS — I10 Essential (primary) hypertension: Secondary | ICD-10-CM

## 2023-10-17 DIAGNOSIS — I779 Disorder of arteries and arterioles, unspecified: Secondary | ICD-10-CM

## 2023-10-17 NOTE — Patient Instructions (Addendum)
 Medication Instructions:  Your physician recommends that you continue on your current medications as directed. Please refer to the Current Medication list given to you today.  *If you need a refill on your cardiac medications before your next appointment, please call your pharmacy*  Lab Work: NONE  Testing/Procedures: NONE  Follow-Up: At [facility], you and your health needs are our priority.  As part of our continuing mission to provide you with exceptional heart care, we have created designated Provider Care Teams.  These Care Teams include your primary Cardiologist (physician) and Advanced Practice Providers (APPs -  Physician Assistants and Nurse Practitioners) who all work together to provide you with the care you need, when you need it.  We recommend signing up for the patient portal called MyChart.  Sign up information is provided on this After Visit Summary.  MyChart is used to connect with patients for Virtual Visits (Telemedicine).  Patients are able to view lab/test results, encounter notes, upcoming appointments, etc.  Non-urgent messages can be sent to your provider as well.   To learn more about what you can do with MyChart, go to [email or web address].    Your next appointment:   6 month(s)  The format for your next appointment:   In Person  Provider:   Reche ORN NP   1 year with Dr Raford [name] are cleared for your upcoming procedure

## 2023-10-17 NOTE — Progress Notes (Signed)
 Cardiology Office Note:  .   Date:  10/17/2023  ID:  [name] [name], DOB [birth year], MRN [id number] PCP: [name] Kins, MD  [facility] Providers Cardiologist:  [name] Scarce, MD    History of Present Illness: .   [name] [name] is a 83 y.o. male with hypertension, CAD s/p CABG (LIMA-->LAD, SVG-->RI/OM1, SVG-->PDA), aortic stenosis s/p AVR, bradycardia, and syncope who presents for follow up.  [name] [name] underwent 4 vessel CABG and AVR (25 mm [facility] Ease pericardial tissue valve) on 09/28/14.  His post-operative course was complicated by atrial fibrillation/flutter, but he was discharged in sinus rhythm.  He had an episode of lightheadedness in 08/2015 and was seen in the ED.  He was noted to have acute renal failure and was felt to be dehydrated. He had been working in the yard for several hours the day before and was not well hydrated.  Metoprolol  was switched to carvedilol  due to bradycardia and hypertension.  He was also started on amlodipine , so simvastatin  was switched to atorvastatin .    [name] [name] has some right lower extremity edema that is chronic.  He had lower extremity Dopplers 03/2018 that were negative for DVT.  His PCP recommended a compression stocking which does seem to help when he wears it. His blood pressure was elevated so amlodipine  was increased.  However, [name] [name] had increased lower extremity edema.  Therefore his dose was reduced and he was started on spironolactone .  Since then his BP has been much better.  He had an episode of syncope in the setting of hypotension, working outside and poor oral intake.   Amlodipine  was discontinued.  He followed up with [name] Satterfield, DNP 02/2020 and was not orthostatic in the office though he did continue to report positional lightheadedness. He was referred for an echo 04/2020 that revealed LVEF 55%.  He had grade 1 diastolic dysfunction and his bioprosthetic aortic valve was functioning well.  The mean gradient was 18  mmHg.  He also had a nuclear stress test with LVEF 63% and no ischemia.  Spironolactone  was reduced due to labile blood pressures  He follow-up with [name] [name] in the next month and was stable but continued to report fatigue. He had his knee replaced 02/2021. His saw his PCP 08/2021 and reported an episode of syncope in July after being stung by hornets. He had an echocardiogram LVEF 60-65 and mod LDH His aortic valve gradient 15 mmHg. Carotid dopplers showed mild stenosis on the right. His spironolactone  was increased from 12.5 mg to 25 mg when he saw [name] [name] on 10/15/2021. His atorvastatin  was also increased from 40 mg to 80 mg.   At his visit 03/2023 BP was elevated in the office but controlled at home.  He reported exertional dyspnea.  Echo revealed LVEF 55-60% and grade 1 diastolic dysfunction.  RV was mildly enlarged.  Bioprosthetic aortic valve was stable.  Mean gradient was 16 mmHg increased from 14 mm previously.   At his visit 10/2021 he had been experiencing some fatigue and dizziness when bending over.  At his visit/2024 he was doing well and the dizziness had resolved.  He saw [name] Finder, [name] 09/2022 and continued to feel well.  Stress PET scan revealed LVEF 39% without evidence of left ventricular ischemia or infarction.  Myocardial blood flow reserve was 1.35.  He was bradycardic and hypotensive after the procedure.  Discussed the use of AI scribe software for clinical note transcription with the patient, who  gave verbal consent to proceed.  History of Present Illness [name] [name]' blood pressure readings at home have been consistently lower than those recorded in the office, averaging around 120s/70s. He recalls an incident in March when his blood pressure was lower while doing yard work. No recent dizzy spells since his last hospital visit, where he experienced a significant drop in blood pressure and heart rate, requiring intervention with caffeine . During that visit, his blood  pressure dropped to 50/40, but it improved shortly after. He suspects a reaction to a stress test medication, possibly administered too quickly, as the cause of this episode.  No recent chest pain, pressure, or breathing difficulties.  He is scheduled for knee surgery and has been experiencing increased pain in his knee, which has been 'bone on bone' since surgery on the other knee. He reports swelling in his ankle last week, which he attributes to wearing a knee support. The swelling improved after removing the support.  His current medications include a baby aspirin , atorvastatin  for cholesterol, and carvedilol , lisinopril , and spironolactone  for blood pressure management.  ROS:  As per HPI  Studies Reviewed: .       Cardiac PET 07/08/23:   LV perfusion is normal. There is no evidence of ischemia. There is no evidence of infarction.   Rest left ventricular function is abnormal. Rest global function is moderately reduced. There were no regional wall motion abnormalities. Rest EF: 39%. End diastolic cavity size is normal. End systolic cavity size is mildly enlarged.   Myocardial blood flow was computed to be 0.23ml/g/min at rest and 1.72ml/g/min at stress. Global myocardial blood flow reserve was 1.35 and was abnormal.   Coronary calcium  assessment not performed due to prior revascularization. duet to prior CABG   Findings are consistent with no ischemia and no infarction. The study is low risk.   Normal perfusion. Stress EF not sent. Abnormal resting EF no RWMA suggest echo correlation. Abnormal MBFR in setting of prior CABG  Note patient has bradycardia and hypotension post procedure Caffeine  given with saline and sent to [facility] ER  Echo 05/08/23:  1. Hypokinesis of the basal lateral wall.. Left ventricular ejection  fraction, by estimation, is 55 to 60%. The left ventricle has normal  function. The left ventricle has no regional wall motion abnormalities.  Left ventricular diastolic parameters  are  consistent with Grade I diastolic dysfunction (impaired relaxation).  Elevated left atrial pressure.   2. Right ventricular systolic function is normal. The right ventricular  size is mildly enlarged.   3. Right atrial size was moderately dilated.   4. The mitral valve is normal in structure. Mild mitral valve  regurgitation.   5. S/p AVR (25 mm [employer] Ease bioprosthesis; implant date 09/2014) Peak and  mean gradients through the valve are 28 and 16 mm Hg respectively AVA  (VTI) is 0.94 cm2 DVI is 0.33. Compared to echo from 2023, mean gradient  is relatively unchanged (14 to 16  mmHg). The aortic valve has been repaired/replaced. Aortic valve  regurgitation is not visualized.   6. Pulmonic valve regurgitation is moderate.   7. The inferior vena cava is normal in size with greater than 50%  respiratory variability, suggesting right atrial pressure of 3 mmHg.   Risk Assessment/Calculations:     HYPERTENSION CONTROL Vitals:   10/17/23 1106 10/17/23 1120  BP: (!) 140/90 (!) 146/84    The patient's blood pressure is elevated above target today.  In order to address the patient's elevated BP:  The blood pressure is usually elevated in clinic.  Blood pressures monitored at home have been optimal.          Physical Exam:   VS:  BP (!) 146/84   Pulse (!) 57   Ht 6' 1 (1.854 m)   Wt 171 lb 3.2 oz (77.7 kg)   SpO2 97%   BMI 22.59 kg/m  , BMI Body mass index is 22.59 kg/m. GENERAL:  Well appearing HEENT: Pupils equal round and reactive, fundi not visualized, oral mucosa unremarkable NECK:  No jugular venous distention, waveform within normal limits, carotid upstroke brisk and symmetric, no bruits, no thyromegaly LUNGS:  Clear to auscultation bilaterally HEART:  RRR.  PMI not displaced or sustained,S1 and S2 within normal limits, no S3, no S4, no clicks, no rubs, no murmurs ABD:  Flat, positive bowel sounds normal in frequency in pitch, no bruits, no rebound, no guarding, no  midline pulsatile mass, no hepatomegaly, no splenomegaly EXT:  2 plus pulses throughout, no edema, no cyanosis no clubbing SKIN:  No rashes no nodules NEURO:  Cranial nerves II through XII grossly intact, motor grossly intact throughout PSYCH:  Cognitively intact, oriented to person place and time   ASSESSMENT AND PLAN: .    Assessment & Plan # CAD s/p CABG:  No ischemic symptoms. PET was negative for ischemia/infarct.  There was reduced myocardial flow reserve.  ?microvascular angina.  He is asymptomatic.  Continue medical management with aspirin , carvedilol , atorvastatin .  No Imdur 2/2 syncope/pre-syncope.   # Essential hypertension and white coat hypertension Home blood pressure well-controlled; office readings indicate white coat hypertension. - Continue carvedilol , lisinopril , spironolactone . - Document white coat hypertension in medical record.  # S/p AVR: Bioprosthetic AVR.  Valve stable on echo this year.  No heart failure symptoms.  Mean gradient 16 mmHg.   # Hyperlipidemia Cholesterol levels within target; atorvastatin  effective. - Continue atorvastatin .  # Cardiac stability for upcoming knee surgery No cardiac symptoms; stable for surgery.  Low risk for surgery.   He can complete >4 METs and had a recent negative stress test.  - Provide letter to Dr. Ivonne confirming cardiac stability.   Dispo: f/u 6 months APP.  1 year with me.   Signed, [name] Scarce, MD

## 2023-10-22 DIAGNOSIS — L821 Other seborrheic keratosis: Secondary | ICD-10-CM | POA: Diagnosis not present

## 2023-10-22 DIAGNOSIS — L814 Other melanin hyperpigmentation: Secondary | ICD-10-CM | POA: Diagnosis not present

## 2023-10-22 DIAGNOSIS — L538 Other specified erythematous conditions: Secondary | ICD-10-CM | POA: Diagnosis not present

## 2023-10-22 DIAGNOSIS — D1801 Hemangioma of skin and subcutaneous tissue: Secondary | ICD-10-CM | POA: Diagnosis not present

## 2023-10-22 DIAGNOSIS — Z85828 Personal history of other malignant neoplasm of skin: Secondary | ICD-10-CM | POA: Diagnosis not present

## 2023-10-22 DIAGNOSIS — Z08 Encounter for follow-up examination after completed treatment for malignant neoplasm: Secondary | ICD-10-CM | POA: Diagnosis not present

## 2023-10-22 DIAGNOSIS — L820 Inflamed seborrheic keratosis: Secondary | ICD-10-CM | POA: Diagnosis not present

## 2023-10-22 DIAGNOSIS — Z789 Other specified health status: Secondary | ICD-10-CM | POA: Diagnosis not present

## 2023-10-22 DIAGNOSIS — R208 Other disturbances of skin sensation: Secondary | ICD-10-CM | POA: Diagnosis not present

## 2023-10-22 DIAGNOSIS — L2989 Other pruritus: Secondary | ICD-10-CM | POA: Diagnosis not present

## 2023-10-28 ENCOUNTER — Other Ambulatory Visit (HOSPITAL_COMMUNITY): Payer: Self-pay

## 2023-10-28 ENCOUNTER — Other Ambulatory Visit (HOSPITAL_BASED_OUTPATIENT_CLINIC_OR_DEPARTMENT_OTHER): Payer: Self-pay | Admitting: Cardiovascular Disease

## 2023-10-28 ENCOUNTER — Other Ambulatory Visit (HOSPITAL_BASED_OUTPATIENT_CLINIC_OR_DEPARTMENT_OTHER): Payer: Self-pay | Admitting: Family

## 2023-10-28 DIAGNOSIS — I10 Essential (primary) hypertension: Secondary | ICD-10-CM

## 2023-10-30 ENCOUNTER — Other Ambulatory Visit: Payer: Self-pay

## 2023-10-30 ENCOUNTER — Other Ambulatory Visit (HOSPITAL_COMMUNITY): Payer: Self-pay

## 2023-10-30 MED ORDER — CARVEDILOL 6.25 MG PO TABS
6.2500 mg | ORAL_TABLET | Freq: Two times a day (BID) | ORAL | 3 refills | Status: DC
  Filled 2023-10-30: qty 180, 90d supply, fill #0

## 2023-10-30 MED ORDER — ATORVASTATIN CALCIUM 40 MG PO TABS
40.0000 mg | ORAL_TABLET | Freq: Every day | ORAL | 3 refills | Status: AC
  Filled 2023-10-30: qty 90, 90d supply, fill #0
  Filled 2024-01-25: qty 90, 90d supply, fill #1

## 2023-10-30 MED ORDER — LISINOPRIL 20 MG PO TABS
20.0000 mg | ORAL_TABLET | Freq: Two times a day (BID) | ORAL | 3 refills | Status: DC
  Filled 2023-10-30: qty 180, 90d supply, fill #0

## 2023-11-05 ENCOUNTER — Other Ambulatory Visit (HOSPITAL_COMMUNITY): Payer: Self-pay

## 2023-11-05 DIAGNOSIS — R399 Unspecified symptoms and signs involving the genitourinary system: Secondary | ICD-10-CM | POA: Diagnosis not present

## 2023-11-05 DIAGNOSIS — Z8546 Personal history of malignant neoplasm of prostate: Secondary | ICD-10-CM | POA: Diagnosis not present

## 2023-11-05 DIAGNOSIS — N5203 Combined arterial insufficiency and corporo-venous occlusive erectile dysfunction: Secondary | ICD-10-CM | POA: Diagnosis not present

## 2023-11-05 DIAGNOSIS — R350 Frequency of micturition: Secondary | ICD-10-CM | POA: Diagnosis not present

## 2023-11-12 DIAGNOSIS — H401123 Primary open-angle glaucoma, left eye, severe stage: Secondary | ICD-10-CM | POA: Diagnosis not present

## 2023-11-12 DIAGNOSIS — Z961 Presence of intraocular lens: Secondary | ICD-10-CM | POA: Diagnosis not present

## 2023-11-12 DIAGNOSIS — H04123 Dry eye syndrome of bilateral lacrimal glands: Secondary | ICD-10-CM | POA: Diagnosis not present

## 2023-11-12 DIAGNOSIS — Z947 Corneal transplant status: Secondary | ICD-10-CM | POA: Diagnosis not present

## 2023-11-12 DIAGNOSIS — H40021 Open angle with borderline findings, high risk, right eye: Secondary | ICD-10-CM | POA: Diagnosis not present

## 2023-11-18 DIAGNOSIS — M25561 Pain in right knee: Secondary | ICD-10-CM | POA: Diagnosis not present

## 2023-11-18 DIAGNOSIS — M1711 Unilateral primary osteoarthritis, right knee: Secondary | ICD-10-CM | POA: Diagnosis not present

## 2023-11-18 NOTE — H&P (Signed)
 TOTAL KNEE ADMISSION H&P  Patient is being admitted for right total knee arthroplasty.  Subjective:  Chief Complaint: Right knee pain.  HPI: [name] [name], 83 y.o. male has a history of pain and functional disability in the right knee due to arthritis and has failed non-surgical conservative treatments for greater than 12 weeks to include NSAID's and/or analgesics, corticosteriod injections, and activity modification. Onset of symptoms was gradual, starting >10 years ago with gradually worsening course since that time. The patient noted no past surgery on the right knee.  Patient currently rates pain in the right knee at 8 out of 10 with activity. Patient has night pain, worsening of pain with activity and weight bearing, pain with passive range of motion, and crepitus. Patient has evidence of advanced bone-on-bone arthritis in the medial compartment with significant patellofemoral narrowing and marginal osteophyte formation by imaging studies. There is no active infection.  Patient Active Problem List   Diagnosis Date Noted   Orthostatic hypotension 11/01/2021   Bilateral carotid artery disease 10/15/2021   Syncope and collapse 09/18/2021   OA (osteoarthritis) of knee 03/12/2021   Primary osteoarthritis of left knee 03/12/2021   Fatigue 04/05/2020   Pure hypercholesterolemia 03/23/2019   S/P aortic valve replacement with bioprosthetic valve 03/23/2019   Acute cholecystitis 10/28/2016   Varicose veins of left lower extremity with complications 05/23/2015   Bradycardia 09/29/2014   Hx of CABG 09/28/2014   Cardiac murmur, previously undiagnosed 09/23/2014   Coronary artery disease involving native heart with unstable angina pectoris (HCC) 09/23/2014   Syncope 09/30/2013   White coat syndrome with diagnosis of hypertension 09/30/2013   GERD (gastroesophageal reflux disease) 09/30/2013    Past Medical History:  Diagnosis Date   A-fib (HCC)    Arthritis    knee   Cataracts, bilateral     Chronic kidney disease    stage 2   Coronary artery disease    Fatigue 04/05/2020   GERD (gastroesophageal reflux disease)    Glaucoma    Hearing loss    wears hearing aids   History of kidney stones    passed stones and 1 time had surgery   Hx of CABG    Hyperlipidemia    Hypertension    Inguinal hernia 03/2019   Orthostatic hypotension 11/01/2021   Prostate cancer (HCC)    Pure hypercholesterolemia 03/23/2019   S/P aortic valve replacement with bioprosthetic valve 03/23/2019   Bioprosthetic 09/2014   Shingles    Varicose veins 03/31/2015   Wears partial dentures    upper    Past Surgical History:  Procedure Laterality Date   AORTIC VALVE REPLACEMENT N/A 09/28/2014   Procedure: AORTIC VALVE REPLACEMENT (AVR);  Surgeon: Elspeth JAYSON Millers, MD;  [facility] OR;  Service: Open Heart Surgery;  Laterality: N/A;   CARDIAC CATHETERIZATION N/A 09/23/2014   Procedure: Left Heart Cath and Coronary Angiography;  Surgeon: Alm LELON Clay, MD;  Location: [facility] INVASIVE CV LAB;  Service: Cardiovascular;  Laterality: N/A;   CATARACT EXTRACTION     CHOLECYSTECTOMY N/A 10/28/2016   Procedure: LAPAROSCOPIC CHOLECYSTECTOMY;  Surgeon: Rubin Calamity, MD;  Location: [facility] OR;  Service: General;  Laterality: N/A;   COLONOSCOPY     CORONARY ARTERY BYPASS GRAFT N/A 09/28/2014   Procedure: CORONARY ARTERY BYPASS GRAFTING (CABG) x4 using left internal mammory artery and right saphenous leg vein harvested endoscopically.;  Surgeon: Elspeth JAYSON Millers, MD;  Location: [facility] OR;  Service: Open Heart Surgery;  Laterality: N/A;   INGUINAL HERNIA REPAIR Right  04/12/2019   Procedure: LAPAROSCOPIC RIGHT INGUINAL HERNIA REPAIR WITH MESH;  Surgeon: Rubin Calamity, MD;  Location: [facility] OR;  Service: General;  Laterality: Right;   kidney stone removal     PROSTATECTOMY     TEE WITHOUT CARDIOVERSION N/A 09/28/2014   Procedure: TRANSESOPHAGEAL ECHOCARDIOGRAM (TEE);  Surgeon: Elspeth JAYSON Millers, MD;  Location: [facility] OR;   Service: Open Heart Surgery;  Laterality: N/A;   TONSILLECTOMY     TOTAL KNEE ARTHROPLASTY Left 03/12/2021   Procedure: TOTAL KNEE ARTHROPLASTY;  Surgeon: Melodi Lerner, MD;  Location: [facility] ORS;  Service: Orthopedics;  Laterality: Left;   WISDOM TOOTH EXTRACTION      Prior to Admission medications   Medication Sig Start Date End Date Taking? Authorizing Provider  acetaminophen  (TYLENOL ) 500 MG tablet Take 1,000 mg by mouth every 6 (six) hours as needed for headache (pain).     [provider]  acyclovir  (ZOVIRAX ) 400 MG tablet Take 1 tablet (400 mg total) by mouth daily. 04/21/23     amoxicillin  (AMOXIL ) 500 MG tablet TAKE 4 TABLETS 1 HOUR PRIOR TO PROCEDURES 11/01/21   Raford Riggs, MD  aspirin  EC 81 MG tablet Take 1 tablet (81 mg total) by mouth daily. Swallow whole. 02/27/22   Raford Riggs, MD  atorvastatin  (LIPITOR ) 40 MG tablet Take 1 tablet (40 mg total) by mouth daily. 10/30/23   Raford Riggs, MD  carvedilol  (COREG ) 6.25 MG tablet Take 1 tablet (6.25 mg total) by mouth 2 (two) times daily. 10/30/23   Raford Riggs, MD  esomeprazole (NEXIUM) 20 MG capsule Take 20 mg by mouth every other day. Reported on 03/01/2015    [provider]  lisinopril  (ZESTRIL ) 20 MG tablet Take 1 tablet (20 mg total) by mouth 2 (two) times daily. 10/30/23   Raford Riggs, MD  meloxicam  (MOBIC ) 15 MG tablet Take 1 tablet (15 mg total) by mouth daily. 09/15/23     methimazole  (TAPAZOLE ) 5 MG tablet Take 1 tablet (5 mg total) by mouth daily. 01/20/23     oxybutynin  (DITROPAN ) 5 MG tablet Take 1 tablet (5 mg total) by mouth 2 (two) times daily. 03/19/23     prednisoLONE  acetate (PRED FORTE ) 1 % ophthalmic suspension Place 1 drop into the left eye daily.    [provider]  sildenafil (VIAGRA) 100 MG tablet Take 100 mg by mouth as needed. 01/08/22   [provider]  spironolactone  (ALDACTONE ) 25 MG tablet Take 0.5 tablets (12.5 mg total) by mouth daily. Please schedule  appointment for refills. 07/30/23   Raford Riggs, MD  timolol  (TIMOPTIC ) 0.5 % ophthalmic solution Place 1 drop into the left eye 2 (two) times daily. 07/14/23       Allergies  Allergen Reactions   Hydrochlorothiazide Swelling   Regadenoson  Other (See Comments)    Hypotension, decreased HR after given for NM Lexi Scan. See note on 07/08/23 by RN McKenzie for more information.    Social History   Socioeconomic History   Marital status: Married    Spouse name: Not on file   Number of children: Not on file   Years of education: Not on file   Highest education level: Not on file  Occupational History   Not on file  Tobacco Use   Smoking status: Former    Current packs/day: 0.00    Types: Cigarettes    Quit date: 05/22/1980    Years since quitting: 43.5   Smokeless tobacco: Never   Tobacco comments:    quit smoking in  1982  Vaping Use   Vaping status: Never Used  Substance and Sexual Activity   Alcohol use: Yes    Alcohol/week: 0.0 standard drinks of alcohol    Comment: occasional liquor/ Beer   Drug use: No   Sexual activity: Yes  Other Topics Concern   Not on file  Social History Narrative   Pt leaving with wife.   Social Drivers of Corporate Investment Banker Strain: Not on file  Food Insecurity: Not on file  Transportation Needs: Not on file  Physical Activity: Not on file  Stress: Not on file  Social Connections: Not on file  Intimate Partner Violence: Not on file    Tobacco Use: Medium Risk (10/17/2023)   Patient History    Smoking Tobacco Use: Former    Smokeless Tobacco Use: Never    Passive Exposure: Not on file   Social History   Substance and Sexual Activity  Alcohol Use Yes   Alcohol/week: 0.0 standard drinks of alcohol   Comment: occasional liquor/ Beer    Family History  Problem Relation Age of Onset   Cancer Mother    Ovarian cancer Mother     ROS  Objective:  Physical Exam: Well nourished and well developed.  General: Alert and  oriented x3, cooperative and pleasant, no acute distress.  Head: normocephalic, atraumatic, neck supple.  Eyes: EOMI.  Musculoskeletal:  Right Knee Exam:  Mild tenderness to palpation about the medial joint line of the knee.  AROM 10-125 degrees.  No effusion noted.  No instability.  Moderate patellofemoral crepitus.  Varus deformity  Calves soft and nontender. Motor function intact in LE. Strength 5/5 LE bilaterally. Neuro: Distal pulses 2+. Sensation to light touch intact in LE.  Imaging Review Plain radiographs demonstrate severe degenerative joint disease of the right knee. The overall alignment is significant varus. The bone quality appears to be adequate for age and reported activity level.  Assessment/Plan:  End stage arthritis, right knee   The patient history, physical examination, clinical judgment of the provider and imaging studies are consistent with end stage degenerative joint disease of the right knee and total knee arthroplasty is deemed medically necessary. The treatment options including medical management, injection therapy arthroscopy and arthroplasty were discussed at length. The risks and benefits of total knee arthroplasty were presented and reviewed. The risks due to aseptic loosening, infection, stiffness, patella tracking problems, thromboembolic complications and other imponderables were discussed. The patient acknowledged the explanation, agreed to proceed with the plan and consent was signed. Patient is being admitted for inpatient treatment for surgery, pain control, PT, OT, prophylactic antibiotics, VTE prophylaxis, progressive ambulation and ADLs and discharge planning. The patient is planning to be discharged home.   Patient's anticipated LOS is less than 2 midnights, meeting these requirements: - Lives within 1 hour of care - Has a competent adult at home to recover with post-op recover - NO history of  - Chronic pain requiring opioids  - Diabetes  -  Heart failure  - Stroke  - DVT/VTE  - Cardiac arrhythmia  - Respiratory Failure/COPD  - Renal failure  - Anemia  - Advanced Liver disease  Therapy Plans: Outpatient therapy at [facility] PT Disposition: Home with wife Planned DVT Prophylaxis: Xarelto  10 mg QD (hx prostate CA) DME Needed: None PCP: Joen Gentry, MD (clearance received) Cardiologist: Annabella Scarce, MD (clearance received) TXA: IV Allergies: HCTZ (swelling) Metal Allergy: None Anesthesia Concerns: None BMI: 22.6 Last HgbA1c: Not diabetic Pain Regimen: Oxycodone , tramadol  [facility]  Other: - 4x CABG w/ valve replacement 2017 - Stopping ASA 5 days prior   - Patient was instructed on what medications to stop prior to surgery. - Follow-up visit in 2 weeks with Dr. Melodi - Begin physical therapy following surgery - Pre-operative lab work as pre-surgical testing - Prescriptions will be provided in hospital at time of discharge  Roxie Mess, PA-C Orthopedic Surgery [facility] [place]

## 2023-11-24 NOTE — Patient Instructions (Signed)
 SURGICAL WAITING ROOM VISITATION Patients having surgery or a procedure may have no more than 2 support people in the waiting area - these visitors may rotate in the visitor waiting room.   Due to an increase in RSV and influenza rates and associated hospitalizations, children ages 39 and under may not visit patients in [facility] hospitals. If the patient needs to stay at the hospital during part of their recovery, the visitor guidelines for inpatient rooms apply.  PRE-OP VISITATION  Pre-op nurse will coordinate an appropriate time for 1 support person to accompany the patient in pre-op.  This support person may not rotate.  This visitor will be contacted when the time is appropriate for the visitor to come back in the pre-op area.  Please refer to the [facility] website for the visitor guidelines for Inpatients (after your surgery is over and you are in a regular room).  You are not required to quarantine at this time prior to your surgery. However, you must do this: Hand Hygiene often Do NOT share personal items Notify your provider if you are in close contact with someone who has COVID or you develop fever 100.4 or greater, new onset of sneezing, cough, sore throat, shortness of breath or body aches.  If you test positive for Covid or have been in contact with anyone that has tested positive in the last 10 days please notify you surgeon.    Your procedure is scheduled on: 12/08/23   Report to [facility] Main Entrance: [place] entrance where the [employer] is available.   Report to admitting at: 11:15 AM  Call this number if you have any questions or problems the morning of surgery [phone]  FOLLOW ANY ADDITIONAL PRE OP INSTRUCTIONS YOU RECEIVED FROM YOUR SURGEON'S OFFICE!!!  Do not eat food after Midnight the night prior to your surgery/procedure.  After Midnight you may have the following liquids until : 10:45 AM DAY OF SURGERY  Clear Liquid Diet Water  Black  Coffee (sugar ok, NO MILK/CREAM OR CREAMERS)  Tea (sugar ok, NO MILK/CREAM OR CREAMERS) regular and decaf                             Plain Jell-O  with no fruit (NO RED)                                           Fruit ices (not with fruit pulp, NO RED)                                     Popsicles (NO RED)                                                                  Juice: NO CITRUS JUICES: only apple, WHITE grape, WHITE cranberry Sports drinks like Gatorade or Powerade (NO RED)   The day of surgery:  Drink ONE (1) Pre-Surgery Clear Ensure  at  : 10:45 AM the morning of surgery. Drink in one sitting. Do not sip.  This  drink was given to you during your hospital pre-op appointment visit. Nothing else to drink after completing the Pre-Surgery Clear Ensure or G2 : No candy, chewing gum or throat lozenges.    Oral Hygiene is also important to reduce your risk of infection.        Remember - BRUSH YOUR TEETH THE MORNING OF SURGERY WITH YOUR REGULAR TOOTHPASTE  Do NOT smoke after Midnight the night before surgery.  STOP TAKING all Vitamins, Herbs and supplements 1 week before your surgery.   Take ONLY these medicines the morning of surgery with A SIP OF WATER : carvedilol ,methimazole ,esomeprazole,oxybutynin .Tylenol  as needed.                   You may not have any metal on your body including hair pins, jewelry, and body piercing  Do not wear lotions, powders, perfumes / cologne, or deodorant   Men may shave face and neck.  Contacts, Hearing Aids, dentures or bridgework may not be worn into surgery. DENTURES WILL BE REMOVED PRIOR TO SURGERY PLEASE DO NOT APPLY Poly grip OR ADHESIVES!!!  You may bring a small overnight bag with you on the day of surgery, only pack items that are not valuable. [facility] IS NOT RESPONSIBLE   FOR VALUABLES THAT ARE LOST OR STOLEN.   Patients discharged on the day of surgery will not be allowed to drive home.  Someone NEEDS to stay with you for the first  24 hours after anesthesia.  Do not bring your home medications to the hospital. The Pharmacy will dispense medications listed on your medication list to you during your admission in the Hospital.  Special Instructions: Bring a copy of your healthcare power of attorney and living will documents the day of surgery, if you wish to have them scanned into your [facility] Records- EPIC  Please read over the following fact sheets you were given: IF YOU HAVE QUESTIONS ABOUT YOUR PRE-OP INSTRUCTIONS, PLEASE CALL [phone]  PATIENT SIGNATURE_________________________________  NURSE SIGNATURE__________________________________  ________________________________________________________________________  Pre-operative 4 CHG Bath Instructions  DYNA-Hex 4 Chlorhexidine  Gluconate 4% Solution Antiseptic 4 fl. oz   You can play a key role in reducing the risk of infection after surgery. Your skin needs to be as free of germs as possible. You can reduce the number of germs on your skin by washing with CHG (chlorhexidine  gluconate) soap before surgery. CHG is an antiseptic soap that kills germs and continues to kill germs even after washing.   DO NOT use if you have an allergy to chlorhexidine /CHG or antibacterial soaps. If your skin becomes reddened or irritated, stop using the CHG and notify one of our RNs at   Please shower with the CHG soap starting 4 days before surgery using the following schedule:     Please keep in mind the following:  DO NOT shave, including legs and underarms, starting the day of your first shower.   You may shave your face at any point before/day of surgery.  Place clean sheets on your bed the day you start using CHG soap. Use a clean washcloth (not used since being washed) for each shower. DO NOT sleep with pets once you start using the CHG.  CHG Shower Instructions:  If you choose to wash your hair and private area, wash first with your normal shampoo/soap.  After  you use shampoo/soap, rinse your hair and body thoroughly to remove shampoo/soap residue.  Turn the water  OFF and apply about 3 tablespoons (45 ml) of CHG  soap to a CLEAN washcloth.  Apply CHG soap ONLY FROM YOUR NECK DOWN TO YOUR TOES (washing for 3-5 minutes)  DO NOT use CHG soap on face, private areas, open wounds, or sores.  Pay special attention to the area where your surgery is being performed.  If you are having back surgery, having someone wash your back for you may be helpful. Wait 2 minutes after CHG soap is applied, then you may rinse off the CHG soap.  Pat dry with a clean towel  Put on clean clothes/pajamas   If you choose to wear lotion, please use ONLY the CHG-compatible lotions on the back of this paper.     Additional instructions for the day of surgery: DO NOT APPLY any lotions, deodorants, cologne, or perfumes.   Put on clean/comfortable clothes.  Brush your teeth.  Ask your nurse before applying any prescription medications to the skin.   CHG Compatible Lotions   Aveeno Moisturizing lotion  Cetaphil Moisturizing Cream  Cetaphil Moisturizing Lotion  Clairol Herbal Essence Moisturizing Lotion, Dry Skin  Clairol Herbal Essence Moisturizing Lotion, Extra Dry Skin  Clairol Herbal Essence Moisturizing Lotion, Normal Skin  Curel Age Defying Therapeutic Moisturizing Lotion with Alpha Hydroxy  Curel Extreme Care Body Lotion  Curel Soothing Hands Moisturizing Hand Lotion  Curel Therapeutic Moisturizing Cream, Fragrance-Free  Curel Therapeutic Moisturizing Lotion, Fragrance-Free  Curel Therapeutic Moisturizing Lotion, Original Formula  Eucerin Daily Replenishing Lotion  Eucerin Dry Skin Therapy Plus Alpha Hydroxy Crme  Eucerin Dry Skin Therapy Plus Alpha Hydroxy Lotion  Eucerin Original Crme  Eucerin Original Lotion  Eucerin Plus Crme Eucerin Plus Lotion  Eucerin TriLipid Replenishing Lotion  Keri Anti-Bacterial Hand Lotion  Keri Deep Conditioning Original Lotion  Dry Skin Formula Softly Scented  Keri Deep Conditioning Original Lotion, Fragrance Free Sensitive Skin Formula  Keri Lotion Fast Absorbing Fragrance Free Sensitive Skin Formula  Keri Lotion Fast Absorbing Softly Scented Dry Skin Formula  Keri Original Lotion  Keri Skin Renewal Lotion Keri Silky Smooth Lotion  Keri Silky Smooth Sensitive Skin Lotion  Nivea Body Creamy Conditioning Oil  Nivea Body Extra Enriched Lotion  Nivea Body Original Lotion  Nivea Body Sheer Moisturizing Lotion Nivea Crme  Nivea Skin Firming Lotion  NutraDerm 30 Skin Lotion  NutraDerm Skin Lotion  NutraDerm Therapeutic Skin Cream  NutraDerm Therapeutic Skin Lotion  ProShield Protective Hand Cream  Provon moisturizing lotion  Incentive Spirometer  An incentive spirometer is a tool that can help keep your lungs clear and active. This tool measures how well you are filling your lungs with each breath. Taking long deep breaths may help reverse or decrease the chance of developing breathing (pulmonary) problems (especially infection) following: A long period of time when you are unable to move or be active. BEFORE THE PROCEDURE  If the spirometer includes an indicator to show your best effort, your nurse or respiratory therapist will set it to a desired goal. If possible, sit up straight or lean slightly forward. Try not to slouch. Hold the incentive spirometer in an upright position. INSTRUCTIONS FOR USE  Sit on the edge of your bed if possible, or sit up as far as you can in bed or on a chair. Hold the incentive spirometer in an upright position. Breathe out normally. Place the mouthpiece in your mouth and seal your lips tightly around it. Breathe in slowly and as deeply as possible, raising the piston or the ball toward the top of the column. Hold your breath for 3-5 seconds or  for as long as possible. Allow the piston or ball to fall to the bottom of the column. Remove the mouthpiece from your mouth and breathe  out normally. Rest for a few seconds and repeat Steps 1 through 7 at least 10 times every 1-2 hours when you are awake. Take your time and take a few normal breaths between deep breaths. The spirometer may include an indicator to show your best effort. Use the indicator as a goal to work toward during each repetition. After each set of 10 deep breaths, practice coughing to be sure your lungs are clear. If you have an incision (the cut made at the time of surgery), support your incision when coughing by placing a pillow or rolled up towels firmly against it. Once you are able to get out of bed, walk around indoors and cough well. You may stop using the incentive spirometer when instructed by your caregiver.  RISKS AND COMPLICATIONS Take your time so you do not get dizzy or light-headed. If you are in pain, you may need to take or ask for pain medication before doing incentive spirometry. It is harder to take a deep breath if you are having pain. AFTER USE Rest and breathe slowly and easily. It can be helpful to keep track of a log of your progress. Your caregiver can provide you with a simple table to help with this. If you are using the spirometer at home, follow these instructions: SEEK MEDICAL CARE IF:  You are having difficultly using the spirometer. You have trouble using the spirometer as often as instructed. Your pain medication is not giving enough relief while using the spirometer. You develop fever of 100.5 F (38.1 C) or higher. SEEK IMMEDIATE MEDICAL CARE IF:  You cough up bloody sputum that had not been present before. You develop fever of 102 F (38.9 C) or greater. You develop worsening pain at or near the incision site. MAKE SURE YOU:  Understand these instructions. Will watch your condition. Will get help right away if you are not doing well or get worse. Document Released: 05/20/2006 Document Revised: 04/01/2011 Document Reviewed: 07/21/2006 [facility] Patient Information  2014 [place], [place].   ________________________________________________________________________

## 2023-11-25 ENCOUNTER — Encounter (HOSPITAL_COMMUNITY)
Admission: RE | Admit: 2023-11-25 | Discharge: 2023-11-25 | Disposition: A | Discharge status: Home / Self Care | Source: Ambulatory Visit | Attending: Orthopedic Surgery | Admitting: Orthopedic Surgery

## 2023-11-25 ENCOUNTER — Encounter (HOSPITAL_COMMUNITY): Payer: Self-pay

## 2023-11-25 ENCOUNTER — Other Ambulatory Visit: Payer: Self-pay

## 2023-11-25 VITALS — BP 144/89 | HR 58 | Temp 98.3°F | Ht 73.0 in | Wt 172.0 lb

## 2023-11-25 DIAGNOSIS — I2510 Atherosclerotic heart disease of native coronary artery without angina pectoris: Secondary | ICD-10-CM | POA: Diagnosis not present

## 2023-11-25 DIAGNOSIS — Z951 Presence of aortocoronary bypass graft: Secondary | ICD-10-CM | POA: Insufficient documentation

## 2023-11-25 DIAGNOSIS — Z01812 Encounter for preprocedural laboratory examination: Secondary | ICD-10-CM | POA: Diagnosis not present

## 2023-11-25 DIAGNOSIS — I25110 Atherosclerotic heart disease of native coronary artery with unstable angina pectoris: Secondary | ICD-10-CM | POA: Insufficient documentation

## 2023-11-25 DIAGNOSIS — I4891 Unspecified atrial fibrillation: Secondary | ICD-10-CM | POA: Diagnosis not present

## 2023-11-25 DIAGNOSIS — M1711 Unilateral primary osteoarthritis, right knee: Secondary | ICD-10-CM | POA: Insufficient documentation

## 2023-11-25 DIAGNOSIS — Z8546 Personal history of malignant neoplasm of prostate: Secondary | ICD-10-CM | POA: Diagnosis not present

## 2023-11-25 DIAGNOSIS — I129 Hypertensive chronic kidney disease with stage 1 through stage 4 chronic kidney disease, or unspecified chronic kidney disease: Secondary | ICD-10-CM | POA: Insufficient documentation

## 2023-11-25 DIAGNOSIS — N182 Chronic kidney disease, stage 2 (mild): Secondary | ICD-10-CM | POA: Diagnosis not present

## 2023-11-25 DIAGNOSIS — Z87891 Personal history of nicotine dependence: Secondary | ICD-10-CM | POA: Insufficient documentation

## 2023-11-25 DIAGNOSIS — Z01818 Encounter for other preprocedural examination: Secondary | ICD-10-CM

## 2023-11-25 LAB — BASIC METABOLIC PANEL WITH GFR
Anion gap: 8 (ref 5–15)
BUN: 33 mg/dL — ABNORMAL HIGH (ref 8–23)
CO2: 26 mmol/L (ref 22–32)
Calcium: 10 mg/dL (ref 8.9–10.3)
Chloride: 105 mmol/L (ref 98–111)
Creatinine, Ser: 1.31 mg/dL — ABNORMAL HIGH (ref 0.61–1.24)
GFR, Estimated: 54 mL/min — ABNORMAL LOW (ref >60)
Glucose, Bld: 121 mg/dL — ABNORMAL HIGH (ref 70–99)
Potassium: 4.4 mmol/L (ref 3.5–5.1)
Sodium: 139 mmol/L (ref 135–145)

## 2023-11-25 LAB — CBC
HCT: 42.5 % (ref 39.0–52.0)
Hemoglobin: 13.3 g/dL (ref 13.0–17.0)
MCH: 31.3 pg (ref 26.0–34.0)
MCHC: 31.3 g/dL (ref 30.0–36.0)
MCV: 100 fL (ref 80.0–100.0)
Platelets: 179 K/uL (ref 150–400)
RBC: 4.25 MIL/uL (ref 4.22–5.81)
RDW: 12.2 % (ref 11.5–15.5)
WBC: 6.5 K/uL (ref 4.0–10.5)
nRBC: 0 % (ref 0.0–0.2)

## 2023-11-25 LAB — SURGICAL PCR SCREEN
MRSA, PCR: NEGATIVE
Staphylococcus aureus: NEGATIVE

## 2023-11-25 NOTE — Progress Notes (Signed)
 For Anesthesia: PCP - Loreli Kins, MD  Cardiologist - Raford Riggs, MD  LOV: 10/17/23  Bowel Prep reminder:  Chest x-ray - 07/08/23 EKG - 07/09/23 Stress Test -  ECHO - 05/08/23 Cardiac Cath - 09/23/14 Pacemaker/ICD device last checked: Pacemaker orders received: Device Rep notified:  Spinal Cord Stimulator:N/A  Sleep Study - N/A CPAP -   Fasting Blood Sugar - N/A Checks Blood Sugar _____ times a day Date and result of last Hgb A1c-  Last dose of GLP1 agonist- N/A GLP1 instructions: Hold 7 days prior to schedule (Hold 24 hours-daily)   Last dose of SGLT-2 inhibitors- N/A SGLT-2 instructions: Hold 72 hours prior to surgery  Blood Thinner Instructions:N/A Last Dose: Time last taken:  Aspirin  Instructions: Last Dose: Time last taken:  Activity level: Can go up a flight of stairs and activities of daily living without stopping and without chest pain and/or shortness of breath   Able to exercise without chest pain and/or shortness of breath  Anesthesia review: Hx:CAD,HTN,CKD 2,CABG.  Patient denies shortness of breath, fever, cough and chest pain at PAT appointment   Patient verbalized understanding of instructions that were reviewed over the telephone.

## 2023-11-26 DIAGNOSIS — Z961 Presence of intraocular lens: Secondary | ICD-10-CM | POA: Diagnosis not present

## 2023-11-26 DIAGNOSIS — Z947 Corneal transplant status: Secondary | ICD-10-CM | POA: Diagnosis not present

## 2023-11-26 DIAGNOSIS — H4052X3 Glaucoma secondary to other eye disorders, left eye, severe stage: Secondary | ICD-10-CM | POA: Diagnosis not present

## 2023-11-26 DIAGNOSIS — H16122 Filamentary keratitis, left eye: Secondary | ICD-10-CM | POA: Diagnosis not present

## 2023-11-26 NOTE — Progress Notes (Signed)
 Anesthesia Chart Review   Case: [id number] Date/Time: 12/08/23 1330   Procedure: ARTHROPLASTY, KNEE, TOTAL (Right: Knee)   Anesthesia type: Choice   Pre-op diagnosis: Right knee osteoarthritis   Location: [facility] ROOM 09 / [facility] ORS   Surgeons: Melodi Lerner, MD       DISCUSSION:83 y.o. former smoker with h/o HTN, a-fib, CAD s/p CABG, s/p AV replacement 2016, CKD Stage II, prostate cancer, right knee OA scheduled for above procedure 12/08/2023 with Dr. Lerner Melodi.   Pt last seen by cardiology 10/17/2023. Per OV note, No cardiac symptoms; stable for surgery.  Low risk for surgery.   He can complete >4 METs and had a recent negative stress test.  VS: BP (!) 144/89   Pulse (!) 58   Temp 36.8 C (Oral)   Ht 6' 1 (1.854 m)   Wt 78 kg   SpO2 100%   BMI 22.69 kg/m   PROVIDERS: Loreli Kins, MD is PCP   Cardiologist - Raford Riggs, MD  LABS: Labs reviewed: Acceptable for surgery. (all labs ordered are listed, but only abnormal results are displayed)  Labs Reviewed  BASIC METABOLIC PANEL WITH GFR - Abnormal; Notable for the following components:      Result Value   Glucose, Bld 121 (*)    BUN 33 (*)    Creatinine, Ser 1.31 (*)    GFR, Estimated 54 (*)    All other components within normal limits  SURGICAL PCR SCREEN  CBC     IMAGES:   EKG:   CV: Echo 05/08/2023 1. Hypokinesis of the basal lateral wall.. Left ventricular ejection  fraction, by estimation, is 55 to 60%. The left ventricle has normal  function. The left ventricle has no regional wall motion abnormalities.  Left ventricular diastolic parameters are  consistent with Grade I diastolic dysfunction (impaired relaxation).  Elevated left atrial pressure.   2. Right ventricular systolic function is normal. The right ventricular  size is mildly enlarged.   3. Right atrial size was moderately dilated.   4. The mitral valve is normal in structure. Mild mitral valve  regurgitation.   5. S/p AVR (25 mm  Magna Ease bioprosthesis; implant date 09/2014) Peak and  mean gradients through the valve are 28 and 16 mm Hg respectively AVA  (VTI) is 0.94 cm2 DVI is 0.33. Compared to echo from 2023, mean gradient  is relatively unchanged (14 to 16  mmHg). The aortic valve has been repaired/replaced. Aortic valve  regurgitation is not visualized.   6. Pulmonic valve regurgitation is moderate.   7. The inferior vena cava is normal in size with greater than 50%  respiratory variability, suggesting right atrial pressure of 3 mmHg.   Comparison(s): The left ventricular function is unchanged. EF 60%, mod  basal septal hypertrophy, AVR mean 14, peak 26 mmHg.   Myocardial Perfusion 05/03/2020 The left ventricular ejection fraction is normal (55-65%). Nuclear stress EF: 63%. There was no ST segment deviation noted during stress. This is a low risk study.   There is a small, moderate, reversible defect in the inferoapex.  Wall motion is normal in this region.  There is significant extracardiac bowel tracer uptake adjacent to the inferoapex with stress on both supine and upright stress images.  This finding likely represents attenuation artifact from extracardiac tracer uptake, but cannot exclude a small area of ischemia.   Overall study is low risk. Past Medical History:  Diagnosis Date   A-fib [facility])    Arthritis    knee  Cataracts, bilateral    Chronic kidney disease    stage 2   Coronary artery disease    Fatigue 04/05/2020   GERD (gastroesophageal reflux disease)    Glaucoma    Hearing loss    wears hearing aids   History of kidney stones    passed stones and 1 time had surgery   Hx of CABG    Hyperlipidemia    Hypertension    Inguinal hernia 03/2019   Orthostatic hypotension 11/01/2021   Prostate cancer (HCC)    Pure hypercholesterolemia 03/23/2019   S/P aortic valve replacement with bioprosthetic valve 03/23/2019   Bioprosthetic 09/2014   Shingles    Varicose veins 03/31/2015   Wears  partial dentures    upper    Past Surgical History:  Procedure Laterality Date   AORTIC VALVE REPLACEMENT N/A 09/28/2014   Procedure: AORTIC VALVE REPLACEMENT (AVR);  Surgeon: Elspeth JAYSON Millers, MD;  Location: [facility] OR;  Service: Open Heart Surgery;  Laterality: N/A;   CARDIAC CATHETERIZATION N/A 09/23/2014   Procedure: Left Heart Cath and Coronary Angiography;  Surgeon: Alm LELON Clay, MD;  Location: [facility] INVASIVE CV LAB;  Service: Cardiovascular;  Laterality: N/A;   CATARACT EXTRACTION Bilateral    CHOLECYSTECTOMY N/A 10/28/2016   Procedure: LAPAROSCOPIC CHOLECYSTECTOMY;  Surgeon: Rubin Calamity, MD;  Location: [facility] OR;  Service: General;  Laterality: N/A;   COLONOSCOPY     CORONARY ARTERY BYPASS GRAFT N/A 09/28/2014   Procedure: CORONARY ARTERY BYPASS GRAFTING (CABG) x4 using left internal mammory artery and right saphenous leg vein harvested endoscopically.;  Surgeon: Elspeth JAYSON Millers, MD;  Location: [facility] OR;  Service: Open Heart Surgery;  Laterality: N/A;   INGUINAL HERNIA REPAIR Right 04/12/2019   Procedure: LAPAROSCOPIC RIGHT INGUINAL HERNIA REPAIR WITH MESH;  Surgeon: Rubin Calamity, MD;  Location: [facility] OR;  Service: General;  Laterality: Right;   kidney stone removal     PROSTATECTOMY     TEE WITHOUT CARDIOVERSION N/A 09/28/2014   Procedure: TRANSESOPHAGEAL ECHOCARDIOGRAM (TEE);  Surgeon: Elspeth JAYSON Millers, MD;  Location[facility] OR;  Service: Open Heart Surgery;  Laterality: N/A;   TONSILLECTOMY     TOTAL KNEE ARTHROPLASTY Left 03/12/2021   Procedure: TOTAL KNEE ARTHROPLASTY;  Surgeon: Melodi Lerner, MD;  Location: [facility] ORS;  Service: Orthopedics;  Laterality: Left;   WISDOM TOOTH EXTRACTION      MEDICATIONS:  acetaminophen  (TYLENOL ) 500 MG tablet   acyclovir  (ZOVIRAX ) 400 MG tablet   amoxicillin  (AMOXIL ) 500 MG tablet   aspirin  EC 81 MG tablet   atorvastatin  (LIPITOR ) 40 MG tablet   carvedilol  (COREG ) 6.25 MG tablet   docusate sodium  (COLACE) 100 MG capsule   esomeprazole  (NEXIUM) 20 MG capsule   lisinopril  (ZESTRIL ) 20 MG tablet   meloxicam  (MOBIC ) 15 MG tablet   methimazole  (TAPAZOLE ) 5 MG tablet   Multiple Vitamin (MULTIVITAMIN WITH MINERALS) TABS tablet   oxybutynin  (DITROPAN ) 5 MG tablet   prednisoLONE  acetate (PRED FORTE ) 1 % ophthalmic suspension   sildenafil (VIAGRA) 100 MG tablet   spironolactone  (ALDACTONE ) 25 MG tablet   timolol  (TIMOPTIC ) 0.5 % ophthalmic solution   No current facility-administered medications for this encounter.    Harlene Hoots Ward, PA-C [facility] Pre-Surgical Testing [phone]

## 2023-11-26 NOTE — Anesthesia Preprocedure Evaluation (Addendum)
 Anesthesia Evaluation  Patient identified by MRN, date of birth, ID band Patient awake    Reviewed: Allergy & Precautions, NPO status , Patient's Chart, lab work & pertinent test results, reviewed documented beta blocker date and time   Airway Mallampati: II  TM Distance: >3 FB     Dental  (+) Missing, Chipped, Partial Lower, Partial Upper, Dental Advisory Given   Pulmonary former smoker   Pulmonary exam normal breath sounds clear to auscultation       Cardiovascular hypertension, Pt. on medications and Pt. on home beta blockers + angina  + CAD, + Past MI and + CABG  Normal cardiovascular exam+ Valvular Problems/Murmurs AS  Rhythm:Regular Rate:Normal  CABG x 3 with AVR 09/28/2014  Echo 05/08/23 1. Hypokinesis of the basal lateral wall.. Left ventricular ejection  fraction, by estimation, is 55 to 60%. The left ventricle has normal  function. The left ventricle has no regional wall motion abnormalities.  Left ventricular diastolic parameters are  consistent with Grade I diastolic dysfunction (impaired relaxation).  Elevated left atrial pressure.   2. Right ventricular systolic function is normal. The right ventricular  size is mildly enlarged.   3. Right atrial size was moderately dilated.   4. The mitral valve is normal in structure. Mild mitral valve  regurgitation.   5. S/p AVR (25 mm Magna Ease bioprosthesis; implant date 09/2014) Peak and  mean gradients through the valve are 28 and 16 mm Hg respectively AVA  (VTI) is 0.94 cm2 DVI is 0.33. Compared to echo from 2023, mean gradient  is relatively unchanged (14 to 16  mmHg). The aortic valve has been repaired/replaced. Aortic valve  regurgitation is not visualized.   6. Pulmonic valve regurgitation is moderate.   7. The inferior vena cava is normal in size with greater than 50%  respiratory variability, suggesting right atrial pressure of 3 mmHg.    EKG 10/17/23 Junctional  rhythm Left axis deviation Incomplete right bundle branch block Anterior infarct , age undetermined   Neuro/Psych Hearing loss  negative psych ROS   GI/Hepatic Neg liver ROS,GERD  Medicated,,  Endo/Other   Hyperthyroidism HLD  Renal/GU Renal InsufficiencyRenal diseaseLab Results      Component                Value               Date                      NA                       139                 11/25/2023                CL                       105                 11/25/2023                K                        4.4                 11/25/2023                CO2  26                  11/25/2023                BUN                      33 (H)              11/25/2023                CREATININE               1.31 (H)            11/25/2023                GFRNONAA                 54 (L)              11/25/2023                CALCIUM                   10.0                11/25/2023                ALBUMIN                   4.2                 02/27/2021                GLUCOSE                  121 (H)             11/25/2023                Musculoskeletal  (+) Arthritis , Osteoarthritis,  OA right knee   Abdominal   Peds  Hematology negative hematology ROS (+) Lab Results      Component                Value               Date                      WBC                      6.5                 11/25/2023                HGB                      13.3                11/25/2023                HCT                      42.5                11/25/2023                MCV                      100.0  11/25/2023                PLT                      179                 11/25/2023              Anesthesia Other Findings   Reproductive/Obstetrics                              Anesthesia Physical Anesthesia Plan  ASA: 3  Anesthesia Plan: Spinal   Post-op Pain Management: Regional block* and Minimal or no pain anticipated    Induction: Intravenous  PONV Risk Score and Plan: 2 and Treatment may vary due to age or medical condition and Propofol  infusion  Airway Management Planned: Natural Airway and Simple Face Mask  Additional Equipment: None  Intra-op Plan:   Post-operative Plan:   Informed Consent: I have reviewed the patients History and Physical, chart, labs and discussed the procedure including the risks, benefits and alternatives for the proposed anesthesia with the patient or authorized representative who has indicated his/her understanding and acceptance.     Dental advisory given  Plan Discussed with: CRNA and Anesthesiologist  Anesthesia Plan Comments: (See PAT note 11/25/2023)         Anesthesia Quick Evaluation

## 2023-12-08 ENCOUNTER — Observation Stay (HOSPITAL_COMMUNITY)
Admission: RE | Admit: 2023-12-08 | Discharge: 2023-12-09 | Disposition: A | Discharge status: Home / Self Care | Attending: Orthopedic Surgery | Admitting: Orthopedic Surgery

## 2023-12-08 ENCOUNTER — Encounter (HOSPITAL_COMMUNITY): Admission: RE | Disposition: A | Payer: Self-pay | Source: Home / Self Care | Attending: Orthopedic Surgery

## 2023-12-08 ENCOUNTER — Other Ambulatory Visit: Payer: Self-pay

## 2023-12-08 ENCOUNTER — Ambulatory Visit (HOSPITAL_COMMUNITY): Payer: Self-pay | Admitting: Physician Assistant

## 2023-12-08 ENCOUNTER — Ambulatory Visit (HOSPITAL_COMMUNITY): Admitting: Anesthesiology

## 2023-12-08 ENCOUNTER — Encounter (HOSPITAL_COMMUNITY): Payer: Self-pay | Admitting: Orthopedic Surgery

## 2023-12-08 DIAGNOSIS — N182 Chronic kidney disease, stage 2 (mild): Secondary | ICD-10-CM | POA: Diagnosis not present

## 2023-12-08 DIAGNOSIS — I25110 Atherosclerotic heart disease of native coronary artery with unstable angina pectoris: Secondary | ICD-10-CM | POA: Diagnosis not present

## 2023-12-08 DIAGNOSIS — I25119 Atherosclerotic heart disease of native coronary artery with unspecified angina pectoris: Secondary | ICD-10-CM | POA: Diagnosis not present

## 2023-12-08 DIAGNOSIS — Z79899 Other long term (current) drug therapy: Secondary | ICD-10-CM | POA: Diagnosis not present

## 2023-12-08 DIAGNOSIS — Z951 Presence of aortocoronary bypass graft: Secondary | ICD-10-CM | POA: Diagnosis not present

## 2023-12-08 DIAGNOSIS — M25561 Pain in right knee: Secondary | ICD-10-CM | POA: Diagnosis present

## 2023-12-08 DIAGNOSIS — Z96652 Presence of left artificial knee joint: Secondary | ICD-10-CM | POA: Diagnosis not present

## 2023-12-08 DIAGNOSIS — Z87891 Personal history of nicotine dependence: Secondary | ICD-10-CM

## 2023-12-08 DIAGNOSIS — I129 Hypertensive chronic kidney disease with stage 1 through stage 4 chronic kidney disease, or unspecified chronic kidney disease: Secondary | ICD-10-CM | POA: Insufficient documentation

## 2023-12-08 DIAGNOSIS — Z8546 Personal history of malignant neoplasm of prostate: Secondary | ICD-10-CM | POA: Insufficient documentation

## 2023-12-08 DIAGNOSIS — M1711 Unilateral primary osteoarthritis, right knee: Secondary | ICD-10-CM

## 2023-12-08 DIAGNOSIS — G8918 Other acute postprocedural pain: Secondary | ICD-10-CM | POA: Diagnosis not present

## 2023-12-08 DIAGNOSIS — I2510 Atherosclerotic heart disease of native coronary artery without angina pectoris: Secondary | ICD-10-CM | POA: Diagnosis not present

## 2023-12-08 DIAGNOSIS — M179 Osteoarthritis of knee, unspecified: Secondary | ICD-10-CM | POA: Diagnosis present

## 2023-12-08 DIAGNOSIS — I10 Essential (primary) hypertension: Secondary | ICD-10-CM | POA: Diagnosis not present

## 2023-12-08 HISTORY — PX: TOTAL KNEE ARTHROPLASTY: SHX125

## 2023-12-08 SURGERY — ARTHROPLASTY, KNEE, TOTAL
Anesthesia: Spinal | Site: Knee | Laterality: Right

## 2023-12-08 MED ORDER — ATORVASTATIN CALCIUM 40 MG PO TABS
40.0000 mg | ORAL_TABLET | Freq: Every day | ORAL | Status: DC
  Administered 2023-12-09: 40 mg via ORAL
  Filled 2023-12-08: qty 1

## 2023-12-08 MED ORDER — DOCUSATE SODIUM 100 MG PO CAPS
100.0000 mg | ORAL_CAPSULE | Freq: Two times a day (BID) | ORAL | Status: DC
  Administered 2023-12-08 – 2023-12-09 (×2): 100 mg via ORAL
  Filled 2023-12-08 (×2): qty 1

## 2023-12-08 MED ORDER — DEXAMETHASONE SOD PHOSPHATE PF 10 MG/ML IJ SOLN
10.0000 mg | Freq: Once | INTRAMUSCULAR | Status: AC
  Administered 2023-12-09: 10 mg via INTRAVENOUS

## 2023-12-08 MED ORDER — SPIRONOLACTONE 12.5 MG HALF TABLET
12.5000 mg | ORAL_TABLET | Freq: Every day | ORAL | Status: DC
  Administered 2023-12-09: 12.5 mg via ORAL
  Filled 2023-12-08: qty 1

## 2023-12-08 MED ORDER — OXYCODONE HCL 5 MG PO TABS
5.0000 mg | ORAL_TABLET | ORAL | Status: DC | PRN
  Administered 2023-12-08 – 2023-12-09 (×4): 10 mg via ORAL
  Filled 2023-12-08 (×4): qty 2

## 2023-12-08 MED ORDER — ORAL CARE MOUTH RINSE: 15.0000 mL | Freq: Once | OROMUCOSAL | Status: AC

## 2023-12-08 MED ORDER — CLONIDINE HCL (ANALGESIA) 100 MCG/ML EP SOLN
EPIDURAL | Status: DC | PRN
  Administered 2023-12-08: 50 ug

## 2023-12-08 MED ORDER — DIPHENHYDRAMINE HCL 12.5 MG/5ML PO ELIX: 12.5000 mg | ORAL_SOLUTION | ORAL | Status: DC | PRN

## 2023-12-08 MED ORDER — METOCLOPRAMIDE HCL 5 MG/ML IJ SOLN: 5.0000 mg | Freq: Three times a day (TID) | INTRAMUSCULAR | Status: DC | PRN

## 2023-12-08 MED ORDER — ONDANSETRON HCL 4 MG/2ML IJ SOLN
INTRAMUSCULAR | Status: DC | PRN
  Administered 2023-12-08: 4 mg via INTRAVENOUS

## 2023-12-08 MED ORDER — OXYBUTYNIN CHLORIDE 5 MG PO TABS
5.0000 mg | ORAL_TABLET | Freq: Two times a day (BID) | ORAL | Status: DC
  Administered 2023-12-09: 5 mg via ORAL
  Filled 2023-12-08: qty 1

## 2023-12-08 MED ORDER — FLEET ENEMA RE ENEM: 1.0000 | ENEMA | Freq: Once | RECTAL | Status: DC | PRN

## 2023-12-08 MED ORDER — MIDAZOLAM HCL (PF) 2 MG/2ML IJ SOLN: 1.0000 mg | Freq: Once | INTRAMUSCULAR | Status: DC

## 2023-12-08 MED ORDER — ONDANSETRON HCL 4 MG/2ML IJ SOLN: 4.0000 mg | Freq: Four times a day (QID) | INTRAMUSCULAR | Status: DC | PRN

## 2023-12-08 MED ORDER — ACETAMINOPHEN 500 MG PO TABS
1000.0000 mg | ORAL_TABLET | Freq: Four times a day (QID) | ORAL | Status: DC
  Administered 2023-12-08 – 2023-12-09 (×3): 1000 mg via ORAL
  Filled 2023-12-08 (×3): qty 2

## 2023-12-08 MED ORDER — LACTATED RINGERS IV SOLN: INTRAVENOUS | Status: DC

## 2023-12-08 MED ORDER — PROPOFOL 10 MG/ML IV BOLUS
INTRAVENOUS | Status: AC
  Filled 2023-12-08: qty 20

## 2023-12-08 MED ORDER — HYDROMORPHONE HCL 1 MG/ML IJ SOLN
0.5000 mg | INTRAMUSCULAR | Status: DC | PRN
  Administered 2023-12-09: 1 mg via INTRAVENOUS
  Filled 2023-12-08: qty 1

## 2023-12-08 MED ORDER — ONDANSETRON HCL 4 MG/2ML IJ SOLN: 4.0000 mg | Freq: Once | INTRAMUSCULAR | Status: DC | PRN

## 2023-12-08 MED ORDER — ACYCLOVIR 400 MG PO TABS
400.0000 mg | ORAL_TABLET | Freq: Every day | ORAL | Status: DC
  Administered 2023-12-09: 400 mg via ORAL
  Filled 2023-12-08: qty 1

## 2023-12-08 MED ORDER — PHENOL 1.4 % MT LIQD: 1.0000 | OROMUCOSAL | Status: DC | PRN

## 2023-12-08 MED ORDER — PHENYLEPHRINE 80 MCG/ML (10ML) SYRINGE FOR IV PUSH (FOR BLOOD PRESSURE SUPPORT)
PREFILLED_SYRINGE | INTRAVENOUS | Status: AC
  Filled 2023-12-08: qty 10

## 2023-12-08 MED ORDER — AMISULPRIDE (ANTIEMETIC) 5 MG/2ML IV SOLN: 10.0000 mg | Freq: Once | INTRAVENOUS | Status: DC | PRN

## 2023-12-08 MED ORDER — OXYCODONE HCL 5 MG PO TABS: 5.0000 mg | ORAL_TABLET | Freq: Once | ORAL | Status: DC | PRN

## 2023-12-08 MED ORDER — PANTOPRAZOLE SODIUM 40 MG PO TBEC
40.0000 mg | DELAYED_RELEASE_TABLET | Freq: Every day | ORAL | Status: DC
  Administered 2023-12-09: 40 mg via ORAL
  Filled 2023-12-08: qty 1

## 2023-12-08 MED ORDER — METHOCARBAMOL 500 MG PO TABS
500.0000 mg | ORAL_TABLET | Freq: Four times a day (QID) | ORAL | Status: DC | PRN
Start: 2023-12-08 — End: 2023-12-09
  Administered 2023-12-08: 500 mg via ORAL
  Filled 2023-12-08: qty 1

## 2023-12-08 MED ORDER — DEXAMETHASONE SODIUM PHOSPHATE 4 MG/ML IJ SOLN
INTRAMUSCULAR | Status: DC | PRN
  Administered 2023-12-08: 4 mg via PERINEURAL

## 2023-12-08 MED ORDER — PHENYLEPHRINE HCL-NACL 20-0.9 MG/250ML-% IV SOLN
INTRAVENOUS | Status: DC | PRN
  Administered 2023-12-08: 50 ug/min via INTRAVENOUS

## 2023-12-08 MED ORDER — ONDANSETRON HCL 4 MG/2ML IJ SOLN
INTRAMUSCULAR | Status: AC
  Filled 2023-12-08: qty 2

## 2023-12-08 MED ORDER — ROPIVACAINE HCL 5 MG/ML IJ SOLN
INTRAMUSCULAR | Status: DC | PRN
  Administered 2023-12-08: 30 mL via PERINEURAL

## 2023-12-08 MED ORDER — DEXAMETHASONE SOD PHOSPHATE PF 10 MG/ML IJ SOLN: 8.0000 mg | Freq: Once | INTRAMUSCULAR | Status: DC

## 2023-12-08 MED ORDER — BISACODYL 10 MG RE SUPP: 10.0000 mg | Freq: Every day | RECTAL | Status: DC | PRN

## 2023-12-08 MED ORDER — SODIUM CHLORIDE (PF) 0.9 % IJ SOLN
INTRAMUSCULAR | Status: AC
  Filled 2023-12-08: qty 50

## 2023-12-08 MED ORDER — METHIMAZOLE 5 MG PO TABS
5.0000 mg | ORAL_TABLET | Freq: Every day | ORAL | Status: DC
  Administered 2023-12-09: 5 mg via ORAL
  Filled 2023-12-08: qty 1

## 2023-12-08 MED ORDER — METOCLOPRAMIDE HCL 5 MG PO TABS: 5.0000 mg | ORAL_TABLET | Freq: Three times a day (TID) | ORAL | Status: DC | PRN

## 2023-12-08 MED ORDER — BUPIVACAINE IN DEXTROSE 0.75-8.25 % IT SOLN
INTRATHECAL | Status: DC | PRN
  Administered 2023-12-08: 2 mL via INTRATHECAL

## 2023-12-08 MED ORDER — OXYCODONE HCL 5 MG/5ML PO SOLN: 5.0000 mg | Freq: Once | ORAL | Status: DC | PRN

## 2023-12-08 MED ORDER — TRAMADOL HCL 50 MG PO TABS
50.0000 mg | ORAL_TABLET | Freq: Four times a day (QID) | ORAL | Status: DC | PRN
  Administered 2023-12-08 – 2023-12-09 (×2): 100 mg via ORAL
  Filled 2023-12-08 (×2): qty 2

## 2023-12-08 MED ORDER — BUPIVACAINE LIPOSOME 1.3 % IJ SUSP
INTRAMUSCULAR | Status: AC
  Filled 2023-12-08: qty 20

## 2023-12-08 MED ORDER — POLYETHYLENE GLYCOL 3350 17 G PO PACK: 17.0000 g | PACK | Freq: Every day | ORAL | Status: DC | PRN

## 2023-12-08 MED ORDER — SODIUM CHLORIDE 0.9 % IV SOLN: INTRAVENOUS | Status: DC

## 2023-12-08 MED ORDER — EPHEDRINE 5 MG/ML INJ
INTRAVENOUS | Status: AC
  Filled 2023-12-08: qty 5

## 2023-12-08 MED ORDER — PREDNISOLONE ACETATE 1 % OP SUSP
1.0000 [drp] | Freq: Every day | OPHTHALMIC | Status: DC
  Administered 2023-12-08 – 2023-12-09 (×2): 1 [drp] via OPHTHALMIC
  Filled 2023-12-08: qty 5

## 2023-12-08 MED ORDER — HYDROMORPHONE HCL 1 MG/ML IJ SOLN
INTRAMUSCULAR | Status: AC
  Filled 2023-12-08: qty 1

## 2023-12-08 MED ORDER — SODIUM CHLORIDE 0.9 % IR SOLN
Status: DC | PRN
  Administered 2023-12-08: 1000 mL

## 2023-12-08 MED ORDER — CEFAZOLIN SODIUM-DEXTROSE 2-4 GM/100ML-% IV SOLN
2.0000 g | INTRAVENOUS | Status: AC
  Administered 2023-12-08: 2 g via INTRAVENOUS
  Filled 2023-12-08: qty 100

## 2023-12-08 MED ORDER — CARVEDILOL 6.25 MG PO TABS
6.2500 mg | ORAL_TABLET | Freq: Two times a day (BID) | ORAL | Status: DC
  Administered 2023-12-08 – 2023-12-09 (×2): 6.25 mg via ORAL
  Filled 2023-12-08 (×2): qty 1

## 2023-12-08 MED ORDER — POVIDONE-IODINE 10 % EX SWAB: 2.0000 | Freq: Once | CUTANEOUS | Status: DC

## 2023-12-08 MED ORDER — TIMOLOL MALEATE 0.5 % OP SOLN
1.0000 [drp] | Freq: Two times a day (BID) | OPHTHALMIC | Status: DC
  Administered 2023-12-08 – 2023-12-09 (×2): 1 [drp] via OPHTHALMIC
  Filled 2023-12-08: qty 5

## 2023-12-08 MED ORDER — CEFAZOLIN SODIUM-DEXTROSE 2-4 GM/100ML-% IV SOLN
2.0000 g | Freq: Four times a day (QID) | INTRAVENOUS | Status: AC
  Administered 2023-12-08 – 2023-12-09 (×2): 2 g via INTRAVENOUS
  Filled 2023-12-08 (×2): qty 100

## 2023-12-08 MED ORDER — RIVAROXABAN 10 MG PO TABS
10.0000 mg | ORAL_TABLET | Freq: Every day | ORAL | Status: DC
  Administered 2023-12-09: 10 mg via ORAL
  Filled 2023-12-08: qty 1

## 2023-12-08 MED ORDER — BUPIVACAINE LIPOSOME 1.3 % IJ SUSP: 20.0000 mL | Freq: Once | INTRAMUSCULAR | Status: DC

## 2023-12-08 MED ORDER — SODIUM CHLORIDE 0.9 % IV SOLN
INTRAVENOUS | Status: DC | PRN
  Administered 2023-12-08: 80 mL

## 2023-12-08 MED ORDER — ACETAMINOPHEN 10 MG/ML IV SOLN
1000.0000 mg | Freq: Four times a day (QID) | INTRAVENOUS | Status: DC
  Administered 2023-12-08: 1000 mg via INTRAVENOUS
  Filled 2023-12-08: qty 100

## 2023-12-08 MED ORDER — PROPOFOL 1000 MG/100ML IV EMUL
INTRAVENOUS | Status: AC
  Filled 2023-12-08: qty 100

## 2023-12-08 MED ORDER — FENTANYL CITRATE (PF) 50 MCG/ML IJ SOSY
50.0000 ug | PREFILLED_SYRINGE | Freq: Once | INTRAMUSCULAR | Status: AC
  Administered 2023-12-08: 50 ug via INTRAVENOUS
  Filled 2023-12-08: qty 2

## 2023-12-08 MED ORDER — CHLORHEXIDINE GLUCONATE 0.12 % MT SOLN
15.0000 mL | Freq: Once | OROMUCOSAL | Status: AC
  Administered 2023-12-08: 15 mL via OROMUCOSAL

## 2023-12-08 MED ORDER — PROPOFOL 500 MG/50ML IV EMUL
INTRAVENOUS | Status: DC | PRN
Start: 2023-12-08 — End: 2023-12-08
  Administered 2023-12-08: 30 mg via INTRAVENOUS
  Administered 2023-12-08: 100 ug/kg/min via INTRAVENOUS

## 2023-12-08 MED ORDER — 0.9 % SODIUM CHLORIDE (POUR BTL) OPTIME
TOPICAL | Status: DC | PRN
  Administered 2023-12-08: 1000 mL

## 2023-12-08 MED ORDER — HYDROMORPHONE HCL 1 MG/ML IJ SOLN
0.2500 mg | INTRAMUSCULAR | Status: DC | PRN
  Administered 2023-12-08 (×2): 0.5 mg via INTRAVENOUS

## 2023-12-08 MED ORDER — METHOCARBAMOL 1000 MG/10ML IJ SOLN: 500.0000 mg | Freq: Four times a day (QID) | INTRAMUSCULAR | Status: DC | PRN

## 2023-12-08 MED ORDER — ONDANSETRON HCL 4 MG PO TABS: 4.0000 mg | ORAL_TABLET | Freq: Four times a day (QID) | ORAL | Status: DC | PRN

## 2023-12-08 MED ORDER — MENTHOL 3 MG MT LOZG: 1.0000 | LOZENGE | OROMUCOSAL | Status: DC | PRN

## 2023-12-08 MED ORDER — TRANEXAMIC ACID-NACL 1000-0.7 MG/100ML-% IV SOLN
1000.0000 mg | INTRAVENOUS | Status: AC
  Administered 2023-12-08: 1000 mg via INTRAVENOUS
  Filled 2023-12-08: qty 100

## 2023-12-08 MED ORDER — SODIUM CHLORIDE (PF) 0.9 % IJ SOLN
INTRAMUSCULAR | Status: AC
  Filled 2023-12-08: qty 10

## 2023-12-08 SURGICAL SUPPLY — 41 items
ATTUNE MED DOME PAT 41 KNEE (Knees) IMPLANT
ATTUNE PS FEM RT SZ 8 CEM KNEE (Femur) IMPLANT
ATTUNE PSRP INSR SZ8 7 KNEE (Insert) IMPLANT
BAG COUNTER SPONGE SURGICOUNT (BAG) IMPLANT
BAG ZIPLOCK 12X15 (MISCELLANEOUS) ×1 IMPLANT
BASE TIBIAL ROT PLAT SZ 8 KNEE (Knees) IMPLANT
BLADE SAG 18X100X1.27 (BLADE) ×1 IMPLANT
BLADE SAW SGTL 11.0X1.19X90.0M (BLADE) ×1 IMPLANT
BNDG ELASTIC 6INX 5YD STR LF (GAUZE/BANDAGES/DRESSINGS) ×1 IMPLANT
BOWL SMART MIX CTS (DISPOSABLE) ×1 IMPLANT
CEMENT HV SMART SET (Cement) ×2 IMPLANT
COVER SURGICAL LIGHT HANDLE (MISCELLANEOUS) ×1 IMPLANT
CUFF TRNQT CYL 34X4.125X (TOURNIQUET CUFF) ×1 IMPLANT
DERMABOND ADVANCED .7 DNX12 (GAUZE/BANDAGES/DRESSINGS) ×1 IMPLANT
DRAPE U-SHAPE 47X51 STRL (DRAPES) ×1 IMPLANT
DRSG AQUACEL AG ADV 3.5X10 (GAUZE/BANDAGES/DRESSINGS) ×1 IMPLANT
DURAPREP 26ML APPLICATOR (WOUND CARE) ×1 IMPLANT
ELECT REM PT RETURN 15FT ADLT (MISCELLANEOUS) ×1 IMPLANT
GLOVE BIO SURGEON STRL SZ 6.5 (GLOVE) IMPLANT
GLOVE BIO SURGEON STRL SZ8 (GLOVE) ×1 IMPLANT
GLOVE BIOGEL PI IND STRL 7.0 (GLOVE) ×1 IMPLANT
GLOVE BIOGEL PI IND STRL 8 (GLOVE) ×1 IMPLANT
GOWN STRL REUS W/ TWL LRG LVL3 (GOWN DISPOSABLE) ×1 IMPLANT
HOLDER FOLEY CATH W/STRAP (MISCELLANEOUS) ×1 IMPLANT
IMMOBILIZER KNEE 20 THIGH 36 (SOFTGOODS) ×1 IMPLANT
KIT TURNOVER KIT A (KITS) ×1 IMPLANT
MANIFOLD NEPTUNE II (INSTRUMENTS) ×1 IMPLANT
NS IRRIG 1000ML POUR BTL (IV SOLUTION) ×1 IMPLANT
PACK TOTAL KNEE CUSTOM (KITS) ×1 IMPLANT
PADDING CAST COTTON 6X4 STRL (CAST SUPPLIES) ×2 IMPLANT
PENCIL SMOKE EVACUATOR (MISCELLANEOUS) ×1 IMPLANT
PIN STEINMAN FIXATION KNEE (PIN) IMPLANT
PROTECTOR NERVE ULNAR (MISCELLANEOUS) ×1 IMPLANT
SET HNDPC FAN SPRY TIP SCT (DISPOSABLE) ×1 IMPLANT
SUT MNCRL AB 4-0 PS2 18 (SUTURE) ×1 IMPLANT
SUT VIC AB 2-0 CT1 TAPERPNT 27 (SUTURE) ×3 IMPLANT
SUTURE STRATFX 0 PDS 27 VIOLET (SUTURE) ×1 IMPLANT
TOWEL GREEN STERILE FF (TOWEL DISPOSABLE) ×1 IMPLANT
TRAY FOLEY MTR SLVR 16FR STAT (SET/KITS/TRAYS/PACK) ×1 IMPLANT
TUBE SUCTION HIGH CAP CLEAR NV (SUCTIONS) ×1 IMPLANT
WRAP KNEE MAXI GEL POST OP (GAUZE/BANDAGES/DRESSINGS) ×1 IMPLANT

## 2023-12-08 NOTE — Anesthesia Procedure Notes (Addendum)
  Anesthesia Regional Block: Adductor canal block   Pre-Anesthetic Checklist: , timeout performed,  Correct Patient, Correct Site, Correct Laterality,  Correct Procedure, Correct Position, site marked,  Risks and benefits discussed,  Surgical consent,  Pre-op evaluation,  At surgeon's request and post-op pain management  Laterality: Right  Prep: chloraprep       Needles:  Injection technique: Single-shot  Needle Type: Echogenic Stimulator Needle     Needle Length: 10cm  Needle Gauge: 21   Needle insertion depth: 7 cm   Additional Needles:   Procedures:,,,, ultrasound used (permanent image in chart),,    Narrative:  Start time: 12/08/2023 1:21 PM End time: 12/08/2023 1:26 PM Injection made incrementally with aspirations every 5 mL.  Performed by: Personally  Anesthesiologist: Jerrye Sharper, MD  Additional Notes: Timeout performed. Patient sedated. Relevant anatomy ID'd using US . Incremental 2-5ml injection of LA with frequent aspiration. Patient tolerated procedure well.

## 2023-12-08 NOTE — Anesthesia Procedure Notes (Signed)
 Spinal  Patient location during procedure: OR Start time: 12/08/2023 1:41 PM End time: 12/08/2023 1:44 PM Reason for block: surgical anesthesia Staffing Performed: anesthesiologist  Anesthesiologist: Jerrye Sharper, MD Performed by: Jerrye Sharper, MD Authorized by: Jerrye Sharper, MD   Preanesthetic Checklist Completed: patient identified, IV checked, site marked, risks and benefits discussed, surgical consent, monitors and equipment checked, pre-op evaluation and timeout performed Spinal Block Patient position: sitting Prep: DuraPrep and site prepped and draped Patient monitoring: heart rate, cardiac monitor, continuous pulse ox and blood pressure Approach: midline Location: L3-4 Injection technique: single-shot Needle Needle type: Pencan  Needle gauge: 24 G Needle length: 9 cm Needle insertion depth: 7 cm Assessment Sensory level: T6 Events: CSF return Additional Notes Patient tolerated procedure well. Adequate sensory level.

## 2023-12-08 NOTE — Progress Notes (Signed)
 Orthopedic Tech Progress Note Patient Details:  [name] [name] May 26, 1940 [id number] Applied CPM per order. Will remove at 7:53pm.  CPM Right Knee CPM Right Knee: On Right Knee Flexion (Degrees): 40 Right Knee Extension (Degrees): 10  Post Interventions Patient Tolerated: Well Instructions Provided: Adjustment of device, Care of device, Poper ambulation with device Ortho Devices Type of Ortho Device: CPM padding Ortho Device/Splint Location: RLE Ortho Device/Splint Interventions: Ordered, Application, Adjustment   Post Interventions Patient Tolerated: Well Instructions Provided: Adjustment of device, Care of device, Poper ambulation with device  Morna Pink 12/08/2023, 3:54 PM

## 2023-12-08 NOTE — Op Note (Signed)
 OPERATIVE REPORT-TOTAL KNEE ARTHROPLASTY   Pre-operative diagnosis- Osteoarthritis  Right knee(s)  Post-operative diagnosis- Osteoarthritis Right knee(s)  Procedure-  Right  Total Knee Arthroplasty  Surgeon- Dempsey GAILS. Tesla Keeler, MD  Assistant- Corean Sender, PA-C   Anesthesia-  Adductor canal block and spinal  EBL-50 mL   Drains None  Tourniquet time-  Total Tourniquet Time Documented: Thigh (Right) - 36 minutes Total: Thigh (Right) - 36 minutes     Complications- None  Condition-PACU - hemodynamically stable.   Brief Clinical Note  [name] [name] is a 83 y.o. year old male with end stage OA of his right knee with progressively worsening pain and dysfunction. He has constant pain, with activity and at rest and significant functional deficits with difficulties even with ADLs. He has had extensive non-op management including analgesics, injections of cortisone and viscosupplements, and home exercise program, but remains in significant pain with significant dysfunction. Radiographs show bone on bone arthritis medial and patellofemoral. He presents now for right Total Knee Arthroplasty.     Procedure in detail---   The patient is brought into the operating room and positioned supine on the operating table. After successful administration of  Adductor canal block and spinal,   a tourniquet is placed high on the  Right thigh(s) and the lower extremity is prepped and draped in the usual sterile fashion. Time out is performed by the operating team and then the  Right lower extremity is wrapped in Esmarch, knee flexed and the tourniquet inflated to 300 mmHg.       A midline incision is made with a ten blade through the subcutaneous tissue to the level of the extensor mechanism. A fresh blade is used to make a medial parapatellar arthrotomy. Soft tissue over the proximal medial tibia is subperiosteally elevated to the joint line with a knife and into the semimembranosus bursa with a  Cobb elevator. Soft tissue over the proximal lateral tibia is elevated with attention being paid to avoiding the patellar tendon on the tibial tubercle. The patella is everted, knee flexed 90 degrees and the ACL and PCL are removed. Findings are bone on bone medial and patellofemoral with large global osteophytes        The drill is used to create a starting hole in the distal femur and the canal is thoroughly irrigated with sterile saline to remove the fatty contents. The 5 degree Right  valgus alignment guide is placed into the femoral canal and the distal femoral cutting block is pinned to remove 10 mm off the distal femur. Resection is made with an oscillating saw.      The tibia is subluxed forward and the menisci are removed. The extramedullary alignment guide is placed referencing proximally at the medial aspect of the tibial tubercle and distally along the second metatarsal axis and tibial crest. The block is pinned to remove 2mm off the more deficient lateral  side. Resection is made with an oscillating saw. Size 8is the most appropriate size for the tibia and the proximal tibia is prepared with the modular drill and keel punch for that size.      The femoral sizing guide is placed and size 8 is most appropriate. Rotation is marked off the epicondylar axis and confirmed by creating a rectangular flexion gap at 90 degrees. The size 8 cutting block is pinned in this rotation and the anterior, posterior and chamfer cuts are made with the oscillating saw. The intercondylar block is then placed and that cut is made.  Trial size 8 tibial component, trial size 8 posterior stabilized femur and a 7  mm posterior stabilized rotating platform insert trial is placed. Full extension is achieved with excellent varus/valgus and anterior/posterior balance throughout full range of motion. The patella is everted and thickness measured to be 27  mm. Free hand resection is taken to 15 mm, a 41 template is placed, lug  holes are drilled, trial patella is placed, and it tracks normally. Osteophytes are removed off the posterior femur with the trial in place. All trials are removed and the cut bone surfaces prepared with pulsatile lavage. Cement is mixed and once ready for implantation, the size 8 tibial implant, size  8 posterior stabilized femoral component, and the size 41 patella are cemented in place and the patella is held with the clamp. The trial insert is placed and the knee held in full extension. The Exparel  (20 ml mixed with 60 ml saline) is injected into the extensor mechanism, posterior capsule, medial and lateral gutters and subcutaneous tissues.  All extruded cement is removed and once the cement is hard the permanent 7 mm posterior stabilized rotating platform insert is placed into the tibial tray.      The wound is copiously irrigated with saline solution and the extensor mechanism closed with # 0 Stratofix suture. The tourniquet is released for a total tourniquet time of 36  minutes. Flexion against gravity is 140 degrees and the patella tracks normally. Subcutaneous tissue is closed with 2.0 vicryl and subcuticular with running 4.0 Monocryl. The incision is cleaned and dried and steri-strips and a bulky sterile dressing are applied. The limb is placed into a knee immobilizer and the patient is awakened and transported to recovery in stable condition.      Please note that a surgical assistant was a medical necessity for this procedure in order to perform it in a safe and expeditious manner. Surgical assistant was necessary to retract the ligaments and vital neurovascular structures to prevent injury to them and also necessary for proper positioning of the limb to allow for anatomic placement of the prosthesis.   Dempsey ROCKFORD Azlee Monforte, MD    12/08/2023, 2:48 PM

## 2023-12-08 NOTE — Interval H&P Note (Signed)
 History and Physical Interval Note:  12/08/2023 12:49 PM  [name] [name]  has presented today for surgery, with the diagnosis of Right knee osteoarthritis.  The various methods of treatment have been discussed with the patient and family. After consideration of risks, benefits and other options for treatment, the patient has consented to  Procedure(s): ARTHROPLASTY, KNEE, TOTAL (Right) as a surgical intervention.  The patient's history has been reviewed, patient examined, no change in status, stable for surgery.  I have reviewed the patient's chart and labs.  Questions were answered to the patient's satisfaction.     [name]

## 2023-12-08 NOTE — Care Plan (Signed)
 Ortho Bundle Case Management Note  Patient Details  Name: [name] [name] MRN: [id number] Date of Birth: 08-14-1940  RT TKA on 12/08/23  DCP: Home with wife  DME: No needs; has RW  PT: [facility]                   DME Arranged:  N/A DME Agency:  NA  HH Arranged:    HH Agency:     Additional Comments: Please contact me with any questions of if this plan should need to change.  Burnard Dross, Case Manager [facility] [phone]  Ext. [phone]   12/08/2023, 3:09 PM

## 2023-12-08 NOTE — Progress Notes (Signed)
 Orthopedic Tech Progress Note Patient Details:  [name] [name] [birth year] [id number]  Patient ID: [name] [name], male   DOB: 02-Jan-1941, 83 y.o.   MRN: [id number] Removed CPM from pt.  Morna Pink 12/08/2023, 8:27 PM

## 2023-12-08 NOTE — Discharge Instructions (Addendum)
 [name] Moan, MD Total Joint Specialist [facility] [address] [place], [place] [address] [phone]  TOTAL KNEE REPLACEMENT POSTOPERATIVE DIRECTIONS    Knee Rehabilitation, Guidelines Following Surgery  Results after knee surgery are often greatly improved when you follow the exercise, range of motion and muscle strengthening exercises prescribed by your doctor. Safety measures are also important to protect the knee from further injury. If any of these exercises cause you to have increased pain or swelling in your knee joint, decrease the amount until you are comfortable again and slowly increase them. If you have problems or questions, call your caregiver or physical therapist for advice.   BLOOD CLOT PREVENTION Take a 10 mg Xarelto  once a day for three weeks following surgery. Then resume taking 81 mg Aspirin  once daily.  You may resume your vitamins/supplements once you have discontinued the Xarelto . Do not take any NSAIDs (Advil, Aleve , Ibuprofen, Meloxicam, etc.) until you have discontinued the Xarelto .    HOME CARE INSTRUCTIONS  Remove items at home which could result in a fall. This includes throw rugs or furniture in walking pathways.  ICE to the affected knee as much as tolerated. Icing helps control swelling. If the swelling is well controlled you will be more comfortable and rehab easier. Continue to use ice on the knee for pain and swelling from surgery. You may notice swelling that will progress down to the foot and ankle. This is normal after surgery. Elevate the leg when you are not up walking on it.    Continue to use the breathing machine which will help keep your temperature down. It is common for your temperature to cycle up and down following surgery, especially at night when you are not up moving around and exerting yourself. The breathing machine keeps your lungs expanded and your temperature down. Do not place pillow under the  operative knee, focus on keeping the knee straight while resting  DIET You may resume your previous home diet once you are discharged from the hospital.  DRESSING / WOUND CARE / SHOWERING Keep your bulky bandage on for 2 days. On the third post-operative day you may remove the Ace bandage and gauze. There is a waterproof adhesive bandage on your skin which will stay in place until your first follow-up appointment. Once you remove this you will not need to place another bandage You may begin showering 3 days following surgery, but do not submerge the incision under water .  ACTIVITY For the first 5 days, the key is rest and control of pain and swelling Do your home exercises twice a day starting on post-operative day 3. On the days you go to physical therapy, just do the home exercises once that day. You should rest, ice and elevate the leg for 50 minutes out of every hour. Get up and walk/stretch for 10 minutes per hour. After 5 days you can increase your activity slowly as tolerated. Walk with your walker as instructed. Use the walker until you are comfortable transitioning to a cane. Walk with the cane in the opposite hand of the operative leg. You may discontinue the cane once you are comfortable and walking steadily. Avoid periods of inactivity such as sitting longer than an hour when not asleep. This helps prevent blood clots.  You may discontinue the knee immobilizer once you are able to perform a straight leg raise while lying down. You may resume a sexual relationship in one month or when given the OK by  your doctor.  You may return to work once you are cleared by your doctor.  Do not drive a car for 6 weeks or until released by your surgeon.  Do not drive while taking narcotics.  TED HOSE STOCKINGS Wear the elastic stockings on both legs for three weeks following surgery during the day. You may remove them at night for sleeping.  WEIGHT BEARING Weight bearing as tolerated with assist  device (walker, cane, etc) as directed, use it as long as suggested by your surgeon or therapist, typically at least 4-6 weeks.  POSTOPERATIVE CONSTIPATION PROTOCOL Constipation - defined medically as fewer than three stools per week and severe constipation as less than one stool per week.  One of the most common issues patients have following surgery is constipation.  Even if you have a regular bowel pattern at home, your normal regimen is likely to be disrupted due to multiple reasons following surgery.  Combination of anesthesia, postoperative narcotics, change in appetite and fluid intake all can affect your bowels.  In order to avoid complications following surgery, here are some recommendations in order to help you during your recovery period.  Colace (docusate) - Pick up an over-the-counter form of Colace or another stool softener and take twice a day as long as you are requiring postoperative pain medications.  Take with a full glass of water  daily.  If you experience loose stools or diarrhea, hold the colace until you stool forms back up. If your symptoms do not get better within 1 week or if they get worse, check with your doctor. Dulcolax (bisacodyl ) - Pick up over-the-counter and take as directed by the product packaging as needed to assist with the movement of your bowels.  Take with a full glass of water .  Use this product as needed if not relieved by Colace only.  MiraLax  (polyethylene glycol) - Pick up over-the-counter to have on hand. MiraLax  is a solution that will increase the amount of water  in your bowels to assist with bowel movements.  Take as directed and can mix with a glass of water , juice, soda, coffee, or tea. Take if you go more than two days without a movement. Do not use MiraLax  more than once per day. Call your doctor if you are still constipated or irregular after using this medication for 7 days in a row.  If you continue to have problems with postoperative constipation,  please contact the office for further assistance and recommendations.  If you experience the worst abdominal pain ever or develop nausea or vomiting, please contact the office immediatly for further recommendations for treatment.  ITCHING If you experience itching with your medications, try taking only a single pain pill, or even half a pain pill at a time.  You can also use Benadryl  over the counter for itching or also to help with sleep.   MEDICATIONS See your medication summary on the "After Visit Summary" that the nursing staff will review with you prior to discharge.  You may have some home medications which will be placed on hold until you complete the course of blood thinner medication.  It is important for you to complete the blood thinner medication as prescribed by your surgeon.  Continue your approved medications as instructed at time of discharge.  PRECAUTIONS If you experience chest pain or shortness of breath - call 911 immediately for transfer to the hospital emergency department.  If you develop a fever greater that 101 F, purulent drainage from wound, increased redness  or drainage from wound, foul odor from the wound/dressing, or calf pain - CONTACT YOUR SURGEON.                                                   FOLLOW-UP APPOINTMENTS Make sure you keep all of your appointments after your operation with your surgeon and caregivers. You should call the office at the above phone number and make an appointment for approximately two weeks after the date of your surgery or on the date instructed by your surgeon outlined in the After Visit Summary.  RANGE OF MOTION AND STRENGTHENING EXERCISES  Rehabilitation of the knee is important following a knee injury or an operation. After just a few days of immobilization, the muscles of the thigh which control the knee become weakened and shrink (atrophy). Knee exercises are designed to build up the tone and strength of the thigh muscles and to  improve knee motion. Often times heat used for twenty to thirty minutes before working out will loosen up your tissues and help with improving the range of motion but do not use heat for the first two weeks following surgery. These exercises can be done on a training (exercise) mat, on the floor, on a table or on a bed. Use what ever works the best and is most comfortable for you Knee exercises include:  Leg Lifts - While your knee is still immobilized in a splint or cast, you can do straight leg raises. Lift the leg to 60 degrees, hold for 3 sec, and slowly lower the leg. Repeat 10-20 times 2-3 times daily. Perform this exercise against resistance later as your knee gets better.  Quad and Hamstring Sets - Tighten up the muscle on the front of the thigh (Quad) and hold for 5-10 sec. Repeat this 10-20 times hourly. Hamstring sets are done by pushing the foot backward against an object and holding for 5-10 sec. Repeat as with quad sets.  Leg Slides: Lying on your back, slowly slide your foot toward your buttocks, bending your knee up off the floor (only go as far as is comfortable). Then slowly slide your foot back down until your leg is flat on the floor again. Angel Wings: Lying on your back spread your legs to the side as far apart as you can without causing discomfort.  A rehabilitation program following serious knee injuries can speed recovery and prevent re-injury in the future due to weakened muscles. Contact your doctor or a physical therapist for more information on knee rehabilitation.   POST-OPERATIVE OPIOID TAPER INSTRUCTIONS: It is important to wean off of your opioid medication as soon as possible. If you do not need pain medication after your surgery it is ok to stop day one. Opioids include: Codeine, Hydrocodone (Norco, Vicodin), Oxycodone (Percocet, oxycontin ) and hydromorphone  amongst others.  Long term and even short term use of opiods can cause: Increased pain  response Dependence Constipation Depression Respiratory depression And more.  Withdrawal symptoms can include Flu like symptoms Nausea, vomiting And more Techniques to manage these symptoms Hydrate well Eat regular healthy meals Stay active Use relaxation techniques(deep breathing, meditating, yoga) Do Not substitute Alcohol  to help with tapering If you have been on opioids for less than two weeks and do not have pain than it is ok to stop all together.  Plan to wean off of opioids This  plan should start within one week post op of your joint replacement. Maintain the same interval or time between taking each dose and first decrease the dose.  Cut the total daily intake of opioids by one tablet each day Next start to increase the time between doses. The last dose that should be eliminated is the evening dose.   IF YOU ARE TRANSFERRED TO A SKILLED REHAB FACILITY If the patient is transferred to a skilled rehab facility following release from the hospital, a list of the current medications will be sent to the facility for the patient to continue.  When discharged from the skilled rehab facility, please have the facility set up the patient's Home Health Physical Therapy prior to being released. Also, the skilled facility will be responsible for providing the patient with their medications at time of release from the facility to include their pain medication, the muscle relaxants, and their blood thinner medication. If the patient is still at the rehab facility at time of the two week follow up appointment, the skilled rehab facility will also need to assist the patient in arranging follow up appointment in our office and any transportation needs.  MAKE SURE YOU:  Understand these instructions.  Get help right away if you are not doing well or get worse.   DENTAL ANTIBIOTICS:  In most cases prophylactic antibiotics for Dental procdeures after total joint surgery are not  necessary.  Exceptions are as follows:  1. History of prior total joint infection  2. Severely immunocompromised (Organ Transplant, cancer chemotherapy, Rheumatoid biologic meds such as Humera)  3. Poorly controlled diabetes (A1C &gt; 8.0, blood glucose over 200)  If you have one of these conditions, contact your surgeon for an antibiotic prescription, prior to your dental procedure.    Pick up stool softner and laxative for home use following surgery while on pain medications. Do not submerge incision under water . Please use good hand washing techniques while changing dressing each day. May shower starting three days after surgery. Please use a clean towel to pat the incision dry following showers. Continue to use ice for pain and swelling after surgery. Do not use any lotions or creams on the incision until instructed by your surgeon.    Information on my medicine - XARELTO  (Rivaroxaban )  Why was Xarelto  prescribed for you? Xarelto  was prescribed for you to reduce the risk of blood clots forming after orthopedic surgery. The medical term for these abnormal blood clots is venous thromboembolism (VTE).  What do you need to know about xarelto  ? Take your Xarelto  ONCE DAILY at the same time every day. You may take it either with or without food.  If you have difficulty swallowing the tablet whole, you may crush it and mix in applesauce just prior to taking your dose.  Take Xarelto  exactly as prescribed by your doctor and DO NOT stop taking Xarelto  without talking to the doctor who prescribed the medication.  Stopping without other VTE prevention medication to take the place of Xarelto  may increase your risk of developing a clot.  After discharge, you should have regular check-up appointments with your healthcare provider that is prescribing your Xarelto .    What do you do if you miss a dose? If you miss a dose, take it as soon as you remember on the same day then  continue your regularly scheduled once daily regimen the next day. Do not take two doses of Xarelto  on the same day.   Important Safety Information A  possible side effect of Xarelto  is bleeding. You should call your healthcare provider right away if you experience any of the following: Bleeding from an injury or your nose that does not stop. Unusual colored urine (red or dark brown) or unusual colored stools (red or black). Unusual bruising for unknown reasons. A serious fall or if you hit your head (even if there is no bleeding).  Some medicines may interact with Xarelto  and might increase your risk of bleeding while on Xarelto . To help avoid this, consult your healthcare provider or pharmacist prior to using any new prescription or non-prescription medications, including herbals, vitamins, non-steroidal anti-inflammatory drugs (NSAIDs) and supplements.  This website has more information on Xarelto : www.xarelto .com.

## 2023-12-08 NOTE — Anesthesia Procedure Notes (Addendum)
 Procedure Name: MAC Date/Time: 12/08/2023 1:40 PM  Performed by: Nada Corean CROME, CRNAPre-anesthesia Checklist: Patient identified, Emergency Drugs available, Suction available, Patient being monitored and Timeout performed Patient Re-evaluated:Patient Re-evaluated prior to induction Oxygen Delivery Method: Simple face mask Preoxygenation: Pre-oxygenation with 100% oxygen Induction Type: IV induction Ventilation: Nasal airway inserted- appropriate to patient size Placement Confirmation: positive ETCO2 Dental Injury: Teeth and Oropharynx as per pre-operative assessment

## 2023-12-08 NOTE — Transfer of Care (Signed)
 Immediate Anesthesia Transfer of Care Note  Patient: [name] [name]  Procedure(s) Performed: ARTHROPLASTY, KNEE, TOTAL (Right: Knee)  Patient Location: PACU  Anesthesia Type:MAC, Regional, and Spinal  Level of Consciousness: awake, drowsy, and patient cooperative  Airway & Oxygen Therapy: Patient Spontanous Breathing  Post-op Assessment: Report given to RN and Post -op Vital signs reviewed and stable  Post vital signs: Reviewed and stable  Last Vitals:  Vitals Value Taken Time  BP 111/81 12/08/23 15:09  Temp    Pulse 53 12/08/23 15:11  Resp 15 12/08/23 15:11  SpO2 96 % 12/08/23 15:11  Vitals shown include unfiled device data.  Last Pain:  Vitals:   12/08/23 1334  TempSrc:   PainSc: 0-No pain         Complications: No notable events documented.

## 2023-12-08 NOTE — Anesthesia Postprocedure Evaluation (Signed)
 Anesthesia Post Note  Patient: [name] [name]  Procedure(s) Performed: ARTHROPLASTY, KNEE, TOTAL (Right: Knee)     Patient location during evaluation: PACU Anesthesia Type: Spinal Level of consciousness: oriented and awake and alert Pain management: pain level controlled Vital Signs Assessment: post-procedure vital signs reviewed and stable Respiratory status: spontaneous breathing, respiratory function stable and nonlabored ventilation Cardiovascular status: blood pressure returned to baseline and stable Postop Assessment: no headache, no backache and no apparent nausea or vomiting Anesthetic complications: no   No notable events documented.  Last Vitals:  Vitals:   12/08/23 1545 12/08/23 1600  BP: (!) 125/94 127/64  Pulse: (!) 56 (!) 53  Resp: 14 16  Temp: (!) 36 C   SpO2: 96% 98%    Last Pain:  Vitals:   12/08/23 1537  TempSrc:   PainSc: 5                  Gian Ybarra A.

## 2023-12-09 ENCOUNTER — Other Ambulatory Visit (HOSPITAL_COMMUNITY): Payer: Self-pay

## 2023-12-09 ENCOUNTER — Telehealth (HOSPITAL_COMMUNITY): Payer: Self-pay

## 2023-12-09 ENCOUNTER — Encounter (HOSPITAL_COMMUNITY): Payer: Self-pay | Admitting: Orthopedic Surgery

## 2023-12-09 DIAGNOSIS — M1711 Unilateral primary osteoarthritis, right knee: Secondary | ICD-10-CM | POA: Diagnosis not present

## 2023-12-09 LAB — CBC
HCT: 36.2 % — ABNORMAL LOW (ref 39.0–52.0)
Hemoglobin: 12.1 g/dL — ABNORMAL LOW (ref 13.0–17.0)
MCH: 32.6 pg (ref 26.0–34.0)
MCHC: 33.4 g/dL (ref 30.0–36.0)
MCV: 97.6 fL (ref 80.0–100.0)
Platelets: 155 K/uL (ref 150–400)
RBC: 3.71 MIL/uL — ABNORMAL LOW (ref 4.22–5.81)
RDW: 12.1 % (ref 11.5–15.5)
WBC: 9.9 K/uL (ref 4.0–10.5)
nRBC: 0 % (ref 0.0–0.2)

## 2023-12-09 LAB — BASIC METABOLIC PANEL WITH GFR
Anion gap: 9 (ref 5–15)
BUN: 23 mg/dL (ref 8–23)
CO2: 25 mmol/L (ref 22–32)
Calcium: 9 mg/dL (ref 8.9–10.3)
Chloride: 105 mmol/L (ref 98–111)
Creatinine, Ser: 1.24 mg/dL (ref 0.61–1.24)
GFR, Estimated: 57 mL/min — ABNORMAL LOW (ref >60)
Glucose, Bld: 124 mg/dL — ABNORMAL HIGH (ref 70–99)
Potassium: 4.6 mmol/L (ref 3.5–5.1)
Sodium: 139 mmol/L (ref 135–145)

## 2023-12-09 MED ORDER — METHOCARBAMOL 500 MG PO TABS
500.0000 mg | ORAL_TABLET | Freq: Four times a day (QID) | ORAL | 0 refills | Status: DC | PRN
  Filled 2023-12-09: qty 40, 10d supply, fill #0

## 2023-12-09 MED ORDER — OXYCODONE HCL 5 MG PO TABS
5.0000 mg | ORAL_TABLET | ORAL | 0 refills | Status: DC | PRN
  Filled 2023-12-09: qty 42, 7d supply, fill #0

## 2023-12-09 MED ORDER — TRAMADOL HCL 50 MG PO TABS
50.0000 mg | ORAL_TABLET | Freq: Four times a day (QID) | ORAL | 0 refills | Status: AC | PRN
  Filled 2023-12-09: qty 40, 5d supply, fill #0

## 2023-12-09 MED ORDER — RIVAROXABAN 10 MG PO TABS
10.0000 mg | ORAL_TABLET | Freq: Every day | ORAL | 0 refills | Status: AC
  Filled 2023-12-09: qty 20, 20d supply, fill #0

## 2023-12-09 MED ORDER — ONDANSETRON HCL 4 MG PO TABS
4.0000 mg | ORAL_TABLET | Freq: Four times a day (QID) | ORAL | 0 refills | Status: AC | PRN
  Filled 2023-12-09: qty 20, 5d supply, fill #0

## 2023-12-09 NOTE — Progress Notes (Signed)
 Physical Therapy Treatment Patient Details Name: [name] [name] MRN: [id number] DOB: [birth year] Today's Date: 12/09/2023   History of Present Illness 83 yo male s/p R TKA on 12/08/23. PMH: orthostatic, CAD, syncope, aortic valve replacement, GERD, L TKA 2023, CABG , inguinal hernia repair    PT Comments  Pt progressing quite well this session. Meeting goals and is motivated to d/c home today. Pt is ready to d/c from PT standpoint with spouse assisting  prn.  Pt spouse present for session/stair training.    If plan is discharge home, recommend the following: A little help with bathing/dressing/bathroom;Help with stairs or ramp for entrance;Assist for transportation;Assistance with cooking/housework   Can travel by private vehicle        Equipment Recommendations  None recommended by PT    Recommendations for Other Services       Precautions / Restrictions Precautions Precautions: Fall;Knee Recall of Precautions/Restrictions: Intact Precaution/Restrictions Comments: pt and wife  verbalize importance of terminal knee extension Restrictions Weight Bearing Restrictions Per Provider Order: No RLE Weight Bearing Per Provider Order: Weight bearing as tolerated     Mobility  Bed Mobility               General bed mobility comments: pt in recliner    Transfers Overall transfer level: Needs assistance Equipment used: Rolling walker (2 wheels) Transfers: Sit to/from Stand Sit to Stand: Supervision           General transfer comment: for safety    Ambulation/Gait Ambulation/Gait assistance: Supervision Gait Distance (Feet): 100 Feet Assistive device: Rolling walker (2 wheels) Gait Pattern/deviations: Step-through pattern       General Gait Details: supervision for safety.  step through pattern without incr pain, good stability with RW support, no LOB.   Stairs Stairs: Yes Stairs assistance: Contact guard assist, Supervision Stair Management: One rail Right,  One rail Left, Step to pattern, Sideways Number of Stairs: 5 (x) General stair comments: cues for sequence and technique, spouse present for session   Wheelchair Mobility     Tilt Bed    Modified Rankin (Stroke Patients Only)       Balance Overall balance assessment: No apparent balance deficits (not formally assessed)                                          Communication Communication Communication: No apparent difficulties  Cognition Arousal: Alert Behavior During Therapy: WFL for tasks assessed/performed   PT - Cognitive impairments: No apparent impairments                         Following commands: Intact      Cueing Cueing Techniques: Verbal cues  Exercises Total Joint Exercises Ankle Circles/Pumps: AROM, Both, 10 reps Quad Sets: 10 reps, Both, AROM Heel Slides: AAROM, AROM, Right, 10 reps Hip ABduction/ADduction: AROM, 10 reps, Right    General Comments        Pertinent Vitals/Pain Pain Assessment Pain Assessment: 0-10 Pain Score: 4  Pain Location: right  knee Pain Descriptors / Indicators: Aching, Sore Pain Intervention(s): Limited activity within patient's tolerance, Monitored during session, Premedicated before session, Repositioned, Patient requesting pain meds-RN notified    Home Living Family/patient expects to be discharged to:: Private residence Living Arrangements: Spouse/significant other Available Help at Discharge: Available 24 hours/day;Family Type of Home: House Home Access: Stairs to enter Entrance Stairs-Rails:  Right Entrance Stairs-Number of Steps: 2   Home Layout: One level Home Equipment: Agricultural Consultant (2 wheels);Cane - single point      Prior Function            PT Goals (current goals can now be found in the care plan section) Acute Rehab PT Goals PT Goal Formulation: With patient Time For Goal Achievement: 12/16/23 Potential to Achieve Goals: Good Progress towards PT goals: Progressing  toward goals    Frequency    7X/week      PT Plan      Co-evaluation              AM-PAC PT 6 Clicks Mobility   Outcome Measure  Help needed turning from your back to your side while in a flat bed without using bedrails?: None Help needed moving from lying on your back to sitting on the side of a flat bed without using bedrails?: None Help needed moving to and from a bed to a chair (including a wheelchair)?: None Help needed standing up from a chair using your arms (e.g., wheelchair or bedside chair)?: A Little Help needed to walk in hospital room?: A Little Help needed climbing 3-5 steps with a railing? : A Little 6 Click Score: 21    End of Session Equipment Utilized During Treatment: Gait belt Activity Tolerance: Patient tolerated treatment well Patient left: in chair;with call bell/phone within reach;with chair alarm set;with family/visitor present Nurse Communication: Patient requests pain meds PT Visit Diagnosis: Other abnormalities of gait and mobility (R26.89);Difficulty in walking, not elsewhere classified (R26.2)     Time: 1352-1406 PT Time Calculation (min) (ACUTE ONLY): 14 min  Charges:    $Gait Training: 8-22 mins PT General Charges $$ ACUTE PT VISIT: 1 Visit                     Alwyn Cordner, PT  Acute Rehab Dept [facility]) [phone]  12/09/2023    [facility] 12/09/2023, 2:16 PM

## 2023-12-09 NOTE — Progress Notes (Signed)
 Subjective: 1 Day Post-Op Procedure(s) (LRB): ARTHROPLASTY, KNEE, TOTAL (Right) Patient reports pain as mild.   Patient seen in rounds by Dr. Melodi. Patient is well, and has had no acute complaints or problems No issues overnight. Denies chest pain, SOB, or calf pain. Foley catheter removed this AM.  We will begin therapy today  Objective: Vital signs in last 24 hours: Temp:  [96.8 F (36 C)-99 F (37.2 C)] 97.7 F (36.5 C) (11/18 0810) Pulse Rate:  [45-76] 64 (11/18 0810) Resp:  [12-19] 19 (11/18 0810) BP: (111-164)/(64-110) 142/93 (11/18 0810) SpO2:  [94 %-99 %] 94 % (11/18 0810) Weight:  [78 kg] 78 kg (11/17 1200)  Intake/Output from previous day:  Intake/Output Summary (Last 24 hours) at 12/09/2023 0838 Last data filed at 12/09/2023 0819 Gross per 24 hour  Intake 3096.45 ml  Output 1250 ml  Net 1846.45 ml     Intake/Output this shift: Total I/O In: 240 [P.O.:240] Out: -   Labs: Recent Labs    12/09/23 0327  HGB 12.1*   Recent Labs    12/09/23 0327  WBC 9.9  RBC 3.71*  HCT 36.2*  PLT 155   Recent Labs    12/09/23 0327  NA 139  K 4.6  CL 105  CO2 25  BUN 23  CREATININE 1.24  GLUCOSE 124*  CALCIUM  9.0   No results for input(s): LABPT, INR in the last 72 hours.  Exam: General - Patient is Alert and Oriented Extremity - Neurologically intact Neurovascular intact Sensation intact distally Dorsiflexion/Plantar flexion intact Dressing - dressing C/D/I Motor Function - intact, moving foot and toes well on exam.   Past Medical History:  Diagnosis Date   A-fib (HCC)    Arthritis    knee   Cataracts, bilateral    Chronic kidney disease    stage 2   Coronary artery disease    Fatigue 04/05/2020   GERD (gastroesophageal reflux disease)    Glaucoma    Hearing loss    wears hearing aids   History of kidney stones    passed stones and 1 time had surgery   Hx of CABG    Hyperlipidemia    Hypertension    Inguinal hernia 03/2019    Orthostatic hypotension 11/01/2021   Prostate cancer (HCC)    Pure hypercholesterolemia 03/23/2019   S/P aortic valve replacement with bioprosthetic valve 03/23/2019   Bioprosthetic 09/2014   Shingles    Varicose veins 03/31/2015   Wears partial dentures    upper    Assessment/Plan: 1 Day Post-Op Procedure(s) (LRB): ARTHROPLASTY, KNEE, TOTAL (Right) Principal Problem:   OA (osteoarthritis) of knee Active Problems:   Primary osteoarthritis of right knee  Estimated body mass index is 22.69 kg/m as calculated from the following:   Height as of this encounter: 6' 1 (1.854 m).   Weight as of this encounter: 78 kg. Advance diet Up with therapy D/C IV fluids   Patient's anticipated LOS is less than 2 midnights, meeting these requirements: - Lives within 1 hour of care - Has a competent adult at home to recover with post-op recover - NO history of             - Chronic pain requiring opioids             - Diabetes             - Heart failure             - Stroke             -  DVT/VTE             - Cardiac arrhythmia             - Respiratory Failure/COPD             - Renal failure             - Anemia             - Advanced Liver disease  DVT Prophylaxis - Xarelto  Weight bearing as tolerated. Continue therapy.  Plan is to go Home after hospital stay. Plan for discharge later today if progresses with therapy and meeting goals. Scheduled for OPPT at [facility] PT. Follow-up in the office in 2 weeks.  The PDMP database was reviewed today prior to any opioid medications being prescribed to this patient.  Roxie Mess, PA-C Orthopedic Surgery [phone] 12/09/2023, 8:38 AM

## 2023-12-09 NOTE — Plan of Care (Signed)
  Problem: Pain Management: Goal: Pain level will decrease with appropriate interventions Outcome: Progressing   Problem: Skin Integrity: Goal: Will show signs of wound healing Outcome: Progressing   Problem: Clinical Measurements: Goal: Postoperative complications will be avoided or minimized Outcome: Progressing   Problem: Activity: Goal: Range of joint motion will improve Outcome: Progressing   Problem: Education: Goal: Knowledge of the prescribed therapeutic regimen will improve Outcome: Progressing   Problem: Safety: Goal: Ability to remain free from injury will improve Outcome: Progressing

## 2023-12-09 NOTE — Progress Notes (Signed)
 Discharge medications delivered to patient at the bedside in a secure bag.

## 2023-12-09 NOTE — TOC Transition Note (Signed)
 Transition of Care [facility]) - Discharge Note   Patient Details  Name: [name] [name] MRN: [id number] Date of Birth: 1940/12/17  [facility] CM/SW Contact:  NORMAN ASPEN, LCSW Phone Number: 12/09/2023, 2:13 PM   Clinical Narrative:     Met with pt who confirms he has needed DME in the home.  OPPT already arranged with [facility] PT.  No further IP CM needs.     Final next level of care: OP Rehab Barriers to Discharge: No Barriers Identified   Patient Goals and CMS Choice Patient states their goals for this hospitalization and ongoing recovery are:: return home          Discharge Placement                       Discharge Plan and Services Additional resources added to the After Visit Summary for                  DME Arranged: N/A DME Agency: NA                  Social Drivers of Health (SDOH) Interventions SDOH Screenings   Food Insecurity: No Food Insecurity (12/08/2023)  Housing: Low Risk  (12/08/2023)  Transportation Needs: No Transportation Needs (12/08/2023)  Utilities: Not At Risk (12/08/2023)  Social Connections: Moderately Integrated (12/08/2023)  Tobacco Use: Medium Risk (12/08/2023)     Readmission Risk Interventions     No data to display

## 2023-12-09 NOTE — Telephone Encounter (Signed)
 Pharmacy Patient Advocate Encounter  Insurance verification completed.    The patient is insured through [employer]/ [employer]. Patient has Medicare and is not eligible for a copay card, but may be able to apply for patient assistance or Medicare RX Payment Plan (Patient Must reach out to their plan, if eligible for payment plan), if available.    Ran test claim for Xarelto  10mg  and the current 30 day co-pay is $47.   This test claim was processed through [facility]- copay amounts may vary at other pharmacies due to [employer], or as the patient moves through the different stages of their insurance plan.

## 2023-12-09 NOTE — Evaluation (Signed)
 Physical Therapy Evaluation Patient Details Name: [name] [name] MRN: [id number] DOB: 1940-07-06 Today's Date: 12/09/2023  History of Present Illness  83 yo male s/p R TKA on 12/08/23. PMH: orthostatic, CAD, syncope, aortic valve replacement, GERD, L TKA 2023, CABG , inguinal hernia repair  Clinical Impression  Pt is s/p TKA resulting in the deficits listed below (see PT Problem List).  Pt amb ~ 100' with RW and CGA-supervision. Reviewed TKA HEP. Pt will likely be ready for d/c after pm PT session.  Pt will benefit from acute skilled PT to increase their independence and safety with mobility to allow discharge.          If plan is discharge home, recommend the following: A little help with bathing/dressing/bathroom;Help with stairs or ramp for entrance;Assist for transportation;Assistance with cooking/housework   Can travel by private vehicle        Equipment Recommendations None recommended by PT  Recommendations for Other Services       Functional Status Assessment Patient has had a recent decline in their functional status and demonstrates the ability to make significant improvements in function in a reasonable and predictable amount of time.     Precautions / Restrictions Precautions Precautions: Fall;Knee Restrictions RLE Weight Bearing Per Provider Order: Weight bearing as tolerated      Mobility  Bed Mobility               General bed mobility comments: pt in recliner    Transfers Overall transfer level: Needs assistance Equipment used: Rolling walker (2 wheels) Transfers: Sit to/from Stand Sit to Stand: Contact guard assist           General transfer comment: cues for hand placement    Ambulation/Gait Ambulation/Gait assistance: Contact guard assist Gait Distance (Feet): 100 Feet Assistive device: Rolling walker (2 wheels) Gait Pattern/deviations: Step-to pattern, Step-through pattern       General Gait Details: cues for initial sequence,  RW position; progression to step through, good stability with RW support, no LOB  Stairs            Wheelchair Mobility     Tilt Bed    Modified Rankin (Stroke Patients Only)       Balance Overall balance assessment: No apparent balance deficits (not formally assessed)                                           Pertinent Vitals/Pain Pain Assessment Pain Assessment: 0-10 Pain Score: 6  Pain Location: right  knee Pain Descriptors / Indicators: Aching, Sore Pain Intervention(s): Limited activity within patient's tolerance, Monitored during session, Premedicated before session    Home Living Family/patient expects to be discharged to:: Private residence Living Arrangements: Spouse/significant other Available Help at Discharge: Available 24 hours/day;Family Type of Home: House Home Access: Stairs to enter Entrance Stairs-Rails: Right Entrance Stairs-Number of Steps: 2   Home Layout: One level Home Equipment: [employer] (2 wheels);Cane - single point      Prior Function Prior Level of Function : Independent/Modified Independent             Mobility Comments: independent       Extremity/Trunk Assessment   Upper Extremity Assessment Upper Extremity Assessment: Overall WFL for tasks assessed    Lower Extremity Assessment Lower Extremity Assessment: RLE deficits/detail RLE Deficits / Details: ankle WFL, quads/hip flexors 3/5. knee flexion ~ -10 to  80 degrees       Communication   Communication Communication: No apparent difficulties    Cognition Arousal: Alert Behavior During Therapy: WFL for tasks assessed/performed   PT - Cognitive impairments: No apparent impairments                         Following commands: Intact       Cueing Cueing Techniques: Verbal cues     General Comments      Exercises Total Joint Exercises Ankle Circles/Pumps: AROM, Both, 10 reps Quad Sets: 10 reps, Both, AROM Heel Slides:  AAROM, AROM, Right, 10 reps Hip ABduction/ADduction: AROM, 10 reps, Right   Assessment/Plan    PT Assessment Patient needs continued PT services  PT Problem List Decreased strength;Decreased range of motion;Decreased knowledge of use of DME;Pain;Decreased mobility       PT Treatment Interventions DME instruction;Therapeutic exercise;Gait training;Functional mobility training;Therapeutic activities;Patient/family education;Stair training    PT Goals (Current goals can be found in the Care Plan section)  Acute Rehab PT Goals PT Goal Formulation: With patient Time For Goal Achievement: 12/16/23 Potential to Achieve Goals: Good    Frequency 7X/week     Co-evaluation               AM-PAC PT 6 Clicks Mobility  Outcome Measure Help needed turning from your back to your side while in a flat bed without using bedrails?: A Little Help needed moving from lying on your back to sitting on the side of a flat bed without using bedrails?: A Little Help needed moving to and from a bed to a chair (including a wheelchair)?: A Little Help needed standing up from a chair using your arms (e.g., wheelchair or bedside chair)?: A Little Help needed to walk in hospital room?: A Little Help needed climbing 3-5 steps with a railing? : A Little 6 Click Score: 18    End of Session Equipment Utilized During Treatment: Gait belt Activity Tolerance: Patient tolerated treatment well Patient left: in chair;with call bell/phone within reach;with chair alarm set   PT Visit Diagnosis: Other abnormalities of gait and mobility (R26.89);Difficulty in walking, not elsewhere classified (R26.2)    Time: 8973-8955 PT Time Calculation (min) (ACUTE ONLY): 18 min   Charges:   PT Evaluation $PT Eval Low Complexity: 1 Low   PT General Charges $$ ACUTE PT VISIT: 1 Visit         Becca Bayne, PT  Acute Rehab Dept [facility]) [phone]  12/09/2023   [facility] 12/09/2023, 10:50 AM

## 2023-12-09 NOTE — Progress Notes (Signed)
 Discharge instructions given to patient and wife questions asked and answered.

## 2023-12-10 ENCOUNTER — Other Ambulatory Visit (HOSPITAL_COMMUNITY): Payer: Self-pay

## 2023-12-11 DIAGNOSIS — M1991 Primary osteoarthritis, unspecified site: Secondary | ICD-10-CM | POA: Diagnosis not present

## 2023-12-11 DIAGNOSIS — M1712 Unilateral primary osteoarthritis, left knee: Secondary | ICD-10-CM | POA: Diagnosis not present

## 2023-12-11 DIAGNOSIS — R262 Difficulty in walking, not elsewhere classified: Secondary | ICD-10-CM | POA: Diagnosis not present

## 2023-12-11 DIAGNOSIS — M25561 Pain in right knee: Secondary | ICD-10-CM | POA: Diagnosis not present

## 2023-12-11 DIAGNOSIS — M25562 Pain in left knee: Secondary | ICD-10-CM | POA: Diagnosis not present

## 2023-12-11 DIAGNOSIS — M1711 Unilateral primary osteoarthritis, right knee: Secondary | ICD-10-CM | POA: Diagnosis not present

## 2023-12-15 DIAGNOSIS — M25562 Pain in left knee: Secondary | ICD-10-CM | POA: Diagnosis not present

## 2023-12-15 DIAGNOSIS — M1711 Unilateral primary osteoarthritis, right knee: Secondary | ICD-10-CM | POA: Diagnosis not present

## 2023-12-15 DIAGNOSIS — M25561 Pain in right knee: Secondary | ICD-10-CM | POA: Diagnosis not present

## 2023-12-15 DIAGNOSIS — R262 Difficulty in walking, not elsewhere classified: Secondary | ICD-10-CM | POA: Diagnosis not present

## 2023-12-15 DIAGNOSIS — M1991 Primary osteoarthritis, unspecified site: Secondary | ICD-10-CM | POA: Diagnosis not present

## 2023-12-15 DIAGNOSIS — M1712 Unilateral primary osteoarthritis, left knee: Secondary | ICD-10-CM | POA: Diagnosis not present

## 2023-12-15 NOTE — Discharge Summary (Signed)
 Patient ID: [name] [name] MRN: [id number] DOB/AGE: 05-12-1940 83 y.o.  Admit date: 12/08/2023 Discharge date: 12/09/2023  Admission Diagnoses:  Principal Problem:   OA (osteoarthritis) of knee Active Problems:   Primary osteoarthritis of right knee   Discharge Diagnoses:  Same  Past Medical History:  Diagnosis Date   A-fib (HCC)    Arthritis    knee   Cataracts, bilateral    Chronic kidney disease    stage 2   Coronary artery disease    Fatigue 04/05/2020   GERD (gastroesophageal reflux disease)    Glaucoma    Hearing loss    wears hearing aids   History of kidney stones    passed stones and 1 time had surgery   Hx of CABG    Hyperlipidemia    Hypertension    Inguinal hernia 03/2019   Orthostatic hypotension 11/01/2021   Prostate cancer (HCC)    Pure hypercholesterolemia 03/23/2019   S/P aortic valve replacement with bioprosthetic valve 03/23/2019   Bioprosthetic 09/2014   Shingles    Varicose veins 03/31/2015   Wears partial dentures    upper    Surgeries: Procedure(s): ARTHROPLASTY, KNEE, TOTAL on 12/08/2023   Consultants:   Discharged Condition: Improved  Hospital Course: ALDAN CAMEY is an 83 y.o. male who was admitted 12/08/2023 for operative treatment ofOA (osteoarthritis) of knee. Patient has severe unremitting pain that affects sleep, daily activities, and work/hobbies. After pre-op clearance the patient was taken to the operating room on 12/08/2023 and underwent  Procedure(s): ARTHROPLASTY, KNEE, TOTAL.    Patient was given perioperative antibiotics:  Anti-infectives (From admission, onward)    Start     Dose/Rate Route Frequency Ordered Stop   12/09/23 1000  acyclovir  (ZOVIRAX ) tablet 400 mg  Status:  Discontinued        400 mg Oral Daily 12/08/23 1839 12/09/23 2013   12/08/23 2000  ceFAZolin  (ANCEF ) IVPB 2g/100 mL premix        2 g 200 mL/hr over 30 Minutes Intravenous Every 6 hours 12/08/23 1835 12/09/23 0250   12/08/23 1200  ceFAZolin   (ANCEF ) IVPB 2g/100 mL premix        2 g 200 mL/hr over 30 Minutes Intravenous On call to O.R. 12/08/23 1145 12/08/23 1418        Patient was given sequential compression devices, early ambulation, and chemoprophylaxis to prevent DVT.  Patient benefited maximally from hospital stay and there were no complications.    Recent vital signs: No data found.   Recent laboratory studies: No results for input(s): WBC, HGB, HCT, PLT, NA, K, CL, CO2, BUN, CREATININE, GLUCOSE, INR, CALCIUM  in the last 72 hours.  Invalid input(s): PT, 2   Discharge Medications:   Allergies as of 12/09/2023       Reactions   Hydrochlorothiazide Swelling   Lips swelled   Regadenoson  Other (See Comments)   Hypotension, decreased HR after given for NM Lexi Scan. See note on 07/08/23 by RN McKenzie for more information.        Medication List     STOP taking these medications    aspirin  EC 81 MG tablet   meloxicam  15 MG tablet Commonly known as: MOBIC    multivitamin with minerals Tabs tablet       TAKE these medications    acetaminophen  500 MG tablet Commonly known as: TYLENOL  Take 1,000 mg by mouth every 6 (six) hours as needed for headache (pain).   acyclovir  400 MG tablet Commonly known as: ZOVIRAX  Take  1 tablet (400 mg total) by mouth daily.   amoxicillin  500 MG tablet Commonly known as: AMOXIL  TAKE 4 TABLETS 1 HOUR PRIOR TO PROCEDURES   atorvastatin  40 MG tablet Commonly known as: LIPITOR  Take 1 tablet (40 mg total) by mouth daily.   carvedilol  6.25 MG tablet Commonly known as: COREG  Take 1 tablet (6.25 mg total) by mouth 2 (two) times daily.   docusate sodium  100 MG capsule Commonly known as: COLACE Take 100 mg by mouth daily.   esomeprazole 20 MG capsule Commonly known as: NEXIUM Take 20 mg by mouth every other day. Reported on 03/01/2015   lisinopril  20 MG tablet Commonly known as: ZESTRIL  Take 1 tablet (20 mg total) by mouth 2 (two) times  daily.   methimazole  5 MG tablet Commonly known as: TAPAZOLE  Take 1 tablet (5 mg total) by mouth daily.   methocarbamol  500 MG tablet Commonly known as: ROBAXIN  Take 1 tablet (500 mg total) by mouth every 6 (six) hours as needed for muscle spasms.   ondansetron  4 MG tablet Commonly known as: ZOFRAN  Take 1 tablet (4 mg total) by mouth every 6 (six) hours as needed for nausea.   oxybutynin  5 MG tablet Commonly known as: DITROPAN  Take 1 tablet (5 mg total) by mouth 2 (two) times daily.   oxyCODONE  5 MG immediate release tablet Commonly known as: Oxy IR/ROXICODONE  Take 1 tablet (5 mg total) by mouth every 4 (four) hours as needed for severe pain (pain score 7-10).   prednisoLONE  acetate 1 % ophthalmic suspension Commonly known as: PRED FORTE  Place 1 drop into the left eye daily.   sildenafil 100 MG tablet Commonly known as: VIAGRA Take 100 mg by mouth daily as needed for erectile dysfunction.   spironolactone  25 MG tablet Commonly known as: ALDACTONE  Take 0.5 tablets (12.5 mg total) by mouth daily. Please schedule appointment for refills.   timolol  0.5 % ophthalmic solution Commonly known as: TIMOPTIC  Place 1 drop into the left eye 2 (two) times daily.   traMADol  50 MG tablet Commonly known as: ULTRAM  Take 1-2 tablets (50-100 mg total) by mouth every 6 (six) hours as needed for moderate pain (pain score 4-6).   Xarelto  10 MG Tabs tablet Generic drug: rivaroxaban  Take 1 tablet (10 mg total) by mouth daily with breakfast for 20 days. Then resume one 81 mg aspirin  once a day               Discharge Care Instructions  (From admission, onward)           Start     Ordered   12/09/23 0000  Weight bearing as tolerated        12/09/23 0840   12/09/23 0000  Change dressing       Comments: You may remove the bulky bandage (ACE wrap and gauze) two days after surgery. You will have an adhesive waterproof bandage underneath. Leave this in place until your first  follow-up appointment.   12/09/23 0840            Diagnostic Studies: No results found.  Disposition: Discharge disposition: 01-Home or Self Care       Discharge Instructions     Call MD / Call 911   Complete by: As directed    If you experience chest pain or shortness of breath, CALL 911 and be transported to the hospital emergency room.  If you develope a fever above 101 F, pus (white drainage) or increased drainage or redness at the wound,  or calf pain, call your surgeon's office.   Change dressing   Complete by: As directed    You may remove the bulky bandage (ACE wrap and gauze) two days after surgery. You will have an adhesive waterproof bandage underneath. Leave this in place until your first follow-up appointment.   Constipation Prevention   Complete by: As directed    Drink plenty of fluids.  Prune juice may be helpful.  You may use a stool softener, such as Colace (over the counter) 100 mg twice a day.  Use MiraLax  (over the counter) for constipation as needed.   Diet - low sodium heart healthy   Complete by: As directed    Do not put a pillow under the knee. Place it under the heel.   Complete by: As directed    Driving restrictions   Complete by: As directed    No driving for two weeks   Post-operative opioid taper instructions:   Complete by: As directed    POST-OPERATIVE OPIOID TAPER INSTRUCTIONS: It is important to wean off of your opioid medication as soon as possible. If you do not need pain medication after your surgery it is ok to stop day one. Opioids include: Codeine, Hydrocodone(Norco, Vicodin), Oxycodone (Percocet, oxycontin ) and hydromorphone  amongst others.  Long term and even short term use of opiods can cause: Increased pain response Dependence Constipation Depression Respiratory depression And more.  Withdrawal symptoms can include Flu like symptoms Nausea, vomiting And more Techniques to manage these symptoms Hydrate well Eat regular  healthy meals Stay active Use relaxation techniques(deep breathing, meditating, yoga) Do Not substitute Alcohol to help with tapering If you have been on opioids for less than two weeks and do not have pain than it is ok to stop all together.  Plan to wean off of opioids This plan should start within one week post op of your joint replacement. Maintain the same interval or time between taking each dose and first decrease the dose.  Cut the total daily intake of opioids by one tablet each day Next start to increase the time between doses. The last dose that should be eliminated is the evening dose.      TED hose   Complete by: As directed    Use stockings (TED hose) for three weeks on both leg(s).  You may remove them at night for sleeping.   Weight bearing as tolerated   Complete by: As directed         Follow-up Information     Melodi Lerner, MD. Go on 12/23/2023.   Specialty: Orthopedic Surgery Why: You are scheduled for a post op appointment on Tuesday 12/23/23 at 1:45pm Contact information: [address] [place] [address] [phone]                  Signed: Roxie Mess 12/15/2023, 9:20 AM

## 2023-12-21 ENCOUNTER — Other Ambulatory Visit: Payer: Self-pay

## 2023-12-21 ENCOUNTER — Emergency Department (HOSPITAL_COMMUNITY)

## 2023-12-21 ENCOUNTER — Observation Stay (HOSPITAL_COMMUNITY)
Admission: EM | Admit: 2023-12-21 | Discharge: 2023-12-24 | DRG: 312 | Disposition: A | Attending: Internal Medicine | Admitting: Internal Medicine

## 2023-12-21 ENCOUNTER — Encounter (HOSPITAL_COMMUNITY): Payer: Self-pay | Admitting: Emergency Medicine

## 2023-12-21 DIAGNOSIS — J9811 Atelectasis: Secondary | ICD-10-CM | POA: Diagnosis not present

## 2023-12-21 DIAGNOSIS — I951 Orthostatic hypotension: Secondary | ICD-10-CM | POA: Diagnosis present

## 2023-12-21 DIAGNOSIS — N281 Cyst of kidney, acquired: Secondary | ICD-10-CM | POA: Diagnosis not present

## 2023-12-21 DIAGNOSIS — R55 Syncope and collapse: Secondary | ICD-10-CM | POA: Diagnosis not present

## 2023-12-21 DIAGNOSIS — Z951 Presence of aortocoronary bypass graft: Secondary | ICD-10-CM | POA: Diagnosis not present

## 2023-12-21 DIAGNOSIS — I2510 Atherosclerotic heart disease of native coronary artery without angina pectoris: Secondary | ICD-10-CM | POA: Diagnosis not present

## 2023-12-21 DIAGNOSIS — I700 Atherosclerosis of aorta: Secondary | ICD-10-CM | POA: Diagnosis not present

## 2023-12-21 DIAGNOSIS — R918 Other nonspecific abnormal finding of lung field: Secondary | ICD-10-CM | POA: Diagnosis not present

## 2023-12-21 DIAGNOSIS — R9389 Abnormal findings on diagnostic imaging of other specified body structures: Secondary | ICD-10-CM | POA: Diagnosis not present

## 2023-12-21 LAB — COMPREHENSIVE METABOLIC PANEL WITH GFR
ALT: 41 U/L (ref 0–44)
AST: 40 U/L (ref 15–41)
Albumin: 3.3 g/dL — ABNORMAL LOW (ref 3.5–5.0)
Alkaline Phosphatase: 79 U/L (ref 38–126)
Anion gap: 10 (ref 5–15)
BUN: 26 mg/dL — ABNORMAL HIGH (ref 8–23)
CO2: 21 mmol/L — ABNORMAL LOW (ref 22–32)
Calcium: 8.9 mg/dL (ref 8.9–10.3)
Chloride: 102 mmol/L (ref 98–111)
Creatinine, Ser: 1.28 mg/dL — ABNORMAL HIGH (ref 0.61–1.24)
GFR, Estimated: 55 mL/min — ABNORMAL LOW (ref >60)
Glucose, Bld: 153 mg/dL — ABNORMAL HIGH (ref 70–99)
Potassium: 4.3 mmol/L (ref 3.5–5.1)
Sodium: 134 mmol/L — ABNORMAL LOW (ref 135–145)
Total Bilirubin: 1.5 mg/dL — ABNORMAL HIGH (ref 0.0–1.2)
Total Protein: 6.5 g/dL (ref 6.5–8.1)

## 2023-12-21 LAB — CBC
HCT: 31.1 % — ABNORMAL LOW (ref 39.0–52.0)
Hemoglobin: 10.2 g/dL — ABNORMAL LOW (ref 13.0–17.0)
MCH: 32.4 pg (ref 26.0–34.0)
MCHC: 32.8 g/dL (ref 30.0–36.0)
MCV: 98.7 fL (ref 80.0–100.0)
Platelets: 437 K/uL — ABNORMAL HIGH (ref 150–400)
RBC: 3.15 MIL/uL — ABNORMAL LOW (ref 4.22–5.81)
RDW: 12.2 % (ref 11.5–15.5)
WBC: 12.9 K/uL — ABNORMAL HIGH (ref 4.0–10.5)
nRBC: 0 % (ref 0.0–0.2)

## 2023-12-21 LAB — TROPONIN T, HIGH SENSITIVITY
Troponin T High Sensitivity: 24 ng/L — ABNORMAL HIGH (ref 0–19)
Troponin T High Sensitivity: 23 ng/L — ABNORMAL HIGH (ref 0–19)

## 2023-12-21 MED ORDER — PROCHLORPERAZINE EDISYLATE 10 MG/2ML IJ SOLN: 5.0000 mg | Freq: Four times a day (QID) | INTRAMUSCULAR | Status: DC | PRN

## 2023-12-21 MED ORDER — LACTATED RINGERS IV SOLN: INTRAVENOUS | Status: AC

## 2023-12-21 MED ORDER — LACTATED RINGERS IV BOLUS
1000.0000 mL | Freq: Once | INTRAVENOUS | Status: AC
  Administered 2023-12-21: 1000 mL via INTRAVENOUS

## 2023-12-21 MED ORDER — MELATONIN 5 MG PO TABS: 5.0000 mg | ORAL_TABLET | Freq: Every evening | ORAL | Status: DC | PRN

## 2023-12-21 MED ORDER — ACETAMINOPHEN 500 MG PO TABS: 500.0000 mg | ORAL_TABLET | Freq: Four times a day (QID) | ORAL | Status: DC | PRN

## 2023-12-21 MED ORDER — POLYETHYLENE GLYCOL 3350 17 G PO PACK: 17.0000 g | PACK | Freq: Every day | ORAL | Status: DC | PRN

## 2023-12-21 MED ORDER — IOHEXOL 350 MG/ML SOLN
75.0000 mL | Freq: Once | INTRAVENOUS | Status: AC | PRN
  Administered 2023-12-21: 75 mL via INTRAVENOUS

## 2023-12-21 MED ORDER — ENOXAPARIN SODIUM 40 MG/0.4ML IJ SOSY
40.0000 mg | PREFILLED_SYRINGE | INTRAMUSCULAR | Status: DC
  Administered 2023-12-21: 40 mg via SUBCUTANEOUS
  Filled 2023-12-21: qty 0.4

## 2023-12-21 NOTE — ED Triage Notes (Signed)
 Pt arriving via [facility] for syncopal episode at home witnessed by his wife. Pt was ambulating to bathroom and had episode when trying to sit down. Pt recently had a right knee replacement approx 2 weeks ago. Pt A&O x4.

## 2023-12-21 NOTE — H&P (Incomplete)
 History and Physical  [name] [name] FMW:[id number] DOB: 01-Apr-1940 DOA: 12/21/2023  Referring physician: Dr. Neysa, EDP  PCP: Loreli Kins, MD  Outpatient Specialists: Cardiology, orthopedic surgery. Patient coming from: Home.  Chief Complaint: Passed out.  HPI: [name] [name] is a 83 y.o. male with medical history significant for coronary artery disease status post CABG, aortic stenosis status post AVR, hypertension, hyperlipidemia, history of prior syncope, history of orthostatic hypotension, who presents to the ER with complaints of loss of consciousness.  On his way to the bathroom today, he felt woozy, sat on the toilet, and lost consciousness.  Recently had a right knee replacement on 12/08/2023 by Dr Melodi.  Was in his usual state of health until today.  In the ER, tachypneic.  No evidence of acute ischemia on 12-lead EKG.  Chest x-ray was nonacute.  CT angio chest was negative for pulmonary embolism.  Lab studies were notable for leukocytosis 12.9 K.  UA is pending.  High-sensitivity troponin 24, 23.  EDP discussed the case with cardiology who recommended admission by the medicine team for syncope workup.  Admitted by [facility], hospitalist service.  ED Course: Temperature 98.5.  BP 146/93, pulse 64, respiratory 25, O2 saturation 96% on room air.  Review of Systems: Review of systems as noted in the HPI. All other systems reviewed and are negative.   Past Medical History:  Diagnosis Date   A-fib [facility])    Arthritis    knee   Cataracts, bilateral    Chronic kidney disease    stage 2   Coronary artery disease    Fatigue 04/05/2020   GERD (gastroesophageal reflux disease)    Glaucoma    Hearing loss    wears hearing aids   History of kidney stones    passed stones and 1 time had surgery   Hx of CABG    Hyperlipidemia    Hypertension    Inguinal hernia 03/2019   Orthostatic hypotension 11/01/2021   Prostate cancer (HCC)    Pure hypercholesterolemia 03/23/2019   S/P  aortic valve replacement with bioprosthetic valve 03/23/2019   Bioprosthetic 09/2014   Shingles    Varicose veins 03/31/2015   Wears partial dentures    upper   Past Surgical History:  Procedure Laterality Date   AORTIC VALVE REPLACEMENT N/A 09/28/2014   Procedure: AORTIC VALVE REPLACEMENT (AVR);  Surgeon: Elspeth JAYSON Millers, MD;  Location: [facility] OR;  Service: Open Heart Surgery;  Laterality: N/A;   CARDIAC CATHETERIZATION N/A 09/23/2014   Procedure: Left Heart Cath and Coronary Angiography;  Surgeon: Alm LELON Clay, MD;  Location: [facility] INVASIVE CV LAB;  Service: Cardiovascular;  Laterality: N/A;   CATARACT EXTRACTION Bilateral    CHOLECYSTECTOMY N/A 10/28/2016   Procedure: LAPAROSCOPIC CHOLECYSTECTOMY;  Surgeon: Rubin Calamity, MD;  Location: [facility] OR;  Service: General;  Laterality: N/A;   COLONOSCOPY     CORONARY ARTERY BYPASS GRAFT N/A 09/28/2014   Procedure: CORONARY ARTERY BYPASS GRAFTING (CABG) x4 using left internal mammory artery and right saphenous leg vein harvested endoscopically.;  Surgeon: Elspeth JAYSON Millers, MD;  Location: [facility] OR;  Service: Open Heart Surgery;  Laterality: N/A;   INGUINAL HERNIA REPAIR Right 04/12/2019   Procedure: LAPAROSCOPIC RIGHT INGUINAL HERNIA REPAIR WITH MESH;  Surgeon: Rubin Calamity, MD;  Location: [facility] OR;  Service: General;  Laterality: Right;   kidney stone removal     PROSTATECTOMY     TEE WITHOUT CARDIOVERSION N/A 09/28/2014   Procedure: TRANSESOPHAGEAL ECHOCARDIOGRAM (TEE);  Surgeon: Elspeth JAYSON Millers,  MD;  Location: [facility] OR;  Service: Open Heart Surgery;  Laterality: N/A;   TONSILLECTOMY     TOTAL KNEE ARTHROPLASTY Left 03/12/2021   Procedure: TOTAL KNEE ARTHROPLASTY;  Surgeon: Melodi Lerner, MD;  Location: [facility] ORS;  Service: Orthopedics;  Laterality: Left;   TOTAL KNEE ARTHROPLASTY Right 12/08/2023   Procedure: ARTHROPLASTY, KNEE, TOTAL;  Surgeon: Melodi Lerner, MD;  Location: [facility] ORS;  Service: Orthopedics;  Laterality: Right;   WISDOM  TOOTH EXTRACTION      Social History:  reports that he quit smoking about 43 years ago. His smoking use included cigarettes. He has never used smokeless tobacco. He reports current alcohol use. He reports that he does not use drugs.   Allergies  Allergen Reactions   Hydrochlorothiazide Swelling    Lips swelled   Regadenoson  Other (See Comments)    Hypotension, decreased HR after given for NM Lexi Scan. See note on 07/08/23 by RN McKenzie for more information.    Family History  Problem Relation Age of Onset   Cancer Mother    Ovarian cancer Mother       Prior to Admission medications   Medication Sig Start Date End Date Taking? Authorizing Provider  acetaminophen  (TYLENOL ) 500 MG tablet Take 1,000 mg by mouth every 6 (six) hours as needed for headache (pain).     [provider]  acyclovir  (ZOVIRAX ) 400 MG tablet Take 1 tablet (400 mg total) by mouth daily. 04/21/23     amoxicillin  (AMOXIL ) 500 MG tablet TAKE 4 TABLETS 1 HOUR PRIOR TO PROCEDURES 11/01/21   Raford Riggs, MD  atorvastatin  (LIPITOR ) 40 MG tablet Take 1 tablet (40 mg total) by mouth daily. 10/30/23   Raford Riggs, MD  carvedilol  (COREG ) 6.25 MG tablet Take 1 tablet (6.25 mg total) by mouth 2 (two) times daily. 10/30/23   Raford Riggs, MD  docusate sodium  (COLACE) 100 MG capsule Take 100 mg by mouth daily.    [provider]  esomeprazole (NEXIUM) 20 MG capsule Take 20 mg by mouth every other day. Reported on 03/01/2015    [provider]  lisinopril  (ZESTRIL ) 20 MG tablet Take 1 tablet (20 mg total) by mouth 2 (two) times daily. 10/30/23   Raford Riggs, MD  methimazole  (TAPAZOLE ) 5 MG tablet Take 1 tablet (5 mg total) by mouth daily. 01/20/23     methocarbamol  (ROBAXIN ) 500 MG tablet Take 1 tablet (500 mg total) by mouth every 6 (six) hours as needed for muscle spasms. 12/09/23   Edmisten, Kristie L, PA  ondansetron  (ZOFRAN ) 4 MG tablet Take 1 tablet (4 mg total) by mouth every 6  (six) hours as needed for nausea. 12/09/23   Edmisten, Kristie L, PA  oxybutynin  (DITROPAN ) 5 MG tablet Take 1 tablet (5 mg total) by mouth 2 (two) times daily. 03/19/23     oxyCODONE  (OXY IR/ROXICODONE ) 5 MG immediate release tablet Take 1 tablet (5 mg total) by mouth every 4 (four) hours as needed for severe pain (pain score 7-10). 12/09/23   Edmisten, Kristie L, PA  prednisoLONE  acetate (PRED FORTE ) 1 % ophthalmic suspension Place 1 drop into the left eye daily.    [provider]  rivaroxaban  (XARELTO ) 10 MG TABS tablet Take 1 tablet (10 mg total) by mouth daily with breakfast for 20 days. Then resume one 81 mg aspirin  once a day 12/10/23 12/30/23  Edmisten, Kristie L, PA  sildenafil (VIAGRA) 100 MG tablet Take 100 mg by mouth daily as needed for erectile dysfunction. 01/08/22  [provider]  spironolactone  (ALDACTONE ) 25 MG tablet Take 0.5 tablets (12.5 mg total) by mouth daily. Please schedule appointment for refills. 07/30/23   Raford Riggs, MD  timolol  (TIMOPTIC ) 0.5 % ophthalmic solution Place 1 drop into the left eye 2 (two) times daily. 07/14/23     traMADol  (ULTRAM ) 50 MG tablet Take 1-2 tablets (50-100 mg total) by mouth every 6 (six) hours as needed for moderate pain (pain score 4-6). 12/09/23   Edmisten, Roxie CROME, PA    Physical Exam: BP (!) 136/91   Pulse 74   Temp 97.8 F (36.6 C) (Oral)   Resp (!) 23   SpO2 96%   General: 83 y.o. year-old male well developed well nourished in no acute distress.  Alert and oriented x3. Cardiovascular: Regular rate and rhythm with no rubs or gallops.  No thyromegaly or JVD noted.  No lower extremity edema. 2/4 pulses in all 4 extremities. Respiratory: Clear to auscultation with no wheezes or rales. Good inspiratory effort. Abdomen: Soft nontender nondistended with normal bowel sounds x4 quadrants. Muskuloskeletal: No cyanosis, clubbing or edema noted bilaterally Neuro: CN II-XII intact, strength, sensation,  reflexes Skin: No ulcerative lesions noted or rashes Psychiatry: Judgement and insight appear normal. Mood is appropriate for condition and setting          Labs on Admission:  Basic Metabolic Panel: Recent Labs  Lab 12/21/23 1806  NA 134*  K 4.3  CL 102  CO2 21*  GLUCOSE 153*  BUN 26*  CREATININE 1.28*  CALCIUM  8.9   Liver Function Tests: Recent Labs  Lab 12/21/23 1806  AST 40  ALT 41  ALKPHOS 79  BILITOT 1.5*  PROT 6.5  ALBUMIN  3.3*   No results for input(s): LIPASE, AMYLASE in the last 168 hours. No results for input(s): AMMONIA in the last 168 hours. CBC: Recent Labs  Lab 12/21/23 1806  WBC 12.9*  HGB 10.2*  HCT 31.1*  MCV 98.7  PLT 437*   Cardiac Enzymes: No results for input(s): CKTOTAL, CKMB, CKMBINDEX, TROPONINI in the last 168 hours.  BNP (last 3 results) No results for input(s): BNP in the last 8760 hours.  ProBNP (last 3 results) No results for input(s): PROBNP in the last 8760 hours.  CBG: No results for input(s): GLUCAP in the last 168 hours.  Radiological Exams on Admission: CT Angio Chest PE W and/or Wo Contrast Result Date: 12/21/2023 CLINICAL DATA:  High probability for PE. EXAM: CT ANGIOGRAPHY CHEST WITH CONTRAST TECHNIQUE: Multidetector CT imaging of the chest was performed using the standard protocol during bolus administration of intravenous contrast. Multiplanar CT image reconstructions and MIPs were obtained to evaluate the vascular anatomy. RADIATION DOSE REDUCTION: This exam was performed according to the departmental dose-optimization program which includes automated exposure control, adjustment of the mA and/or kV according to patient size and/or use of iterative reconstruction technique. CONTRAST:  75mL OMNIPAQUE  IOHEXOL  350 MG/ML SOLN COMPARISON:  CT chest 07/24/2006 FINDINGS: Cardiovascular: Satisfactory opacification of the pulmonary arteries to the segmental level. No evidence of pulmonary embolism. Normal  heart size. No pericardial effusion. There are atherosclerotic calcifications of the aorta. Aortic valve replacement present. Mediastinum/Nodes: No enlarged mediastinal, hilar, or axillary lymph nodes. Thyroid  gland, trachea, and esophagus demonstrate no significant findings. Lungs/Pleura: There is stable mild elevation of the left hemidiaphragm. There are atelectatic changes in the bilateral lower lobes. There is no pleural effusion or pneumothorax. There are few scattered nodular densities in the upper lobes measuring up to 4 mm. Upper  Abdomen: Right renal cyst with peripheral calcifications present. Simple left renal cyst partially imaged. There are punctate calculi in the left kidney. Musculoskeletal: Sternotomy wires are present. No acute fractures are seen. Review of the MIP images confirms the above findings. IMPRESSION: 1. No evidence for pulmonary embolism. 2. Bibasilar atelectasis. 3. Multiple pulmonary nodules. Most significant: Solid pulmonary nodule within the upper lobe measuring 4 mm. No follow-up needed if patient is low-risk (and has no known or suspected primary neoplasm). Non-contrast chest CT can be considered in 12 months if patient is high-risk. This recommendation follows the consensus statement: Guidelines for Management of Incidental Pulmonary Nodules Detected on CT Images: From the Fleischner Society 2017; Radiology 2017; 284:228-243. 4. Aortic atherosclerosis. Aortic Atherosclerosis (ICD10-I70.0). Electronically Signed   By: Greig Pique M.D.   On: 12/21/2023 19:50   DG Chest 2 View Result Date: 12/21/2023 CLINICAL DATA:  Syncope EXAM: CHEST - 2 VIEW COMPARISON:  CHEST x-ray 07/08/2023 FINDINGS: Sternotomy wires are present. The heart size and mediastinal contours are within normal limits. Both lungs are clear. The visualized skeletal structures are unremarkable.There is stable elevation of the left hemidiaphragm. IMPRESSION: No active cardiopulmonary disease. Electronically Signed    By: Greig Pique M.D.   On: 12/21/2023 18:42    EKG: I independently viewed the EKG done and my findings are as followed: Sinus rhythm rate of 66.  Nonspecific ST-T changes.  QTc 430.  Assessment/Plan Present on Admission:  Syncope  Principal Problem:   Syncope  Syncope Prior history of syncope and orthostatic hypotension Follow orthostatic vital signs and transthoracic echocardiogram Monitor on telemetry. PT OT evaluation Fall precautions.  Leukocytosis, rule out active infective process Presented with WBC 12.9. Chest x-ray nonacute CT scan no evidence of active pulmonary infection Follow UA Monitor fever curve and WBCs.  Acute blood loss anemia, post orthopedic surgery Hemoglobin 10.2 from 12 at baseline Monitor H&H  CKD 3 A Appears to be at his baseline Creatinine 1.28 with GFR of 55 Avoid nephrotoxic agents, dehydration, and hypotension. Monitor urine output.  Hypovolemic hyponatremia Serum sodium 134 LR at 75 cc/h x 1 day.  Generalized weakness PT OT evaluation Fall precautions.   Time: 75 minutes.   DVT prophylaxis: Subcu Lovenox  daily.  Code Status: Full code.  Family Communication: None at bedside.  Disposition Plan: Admitted to telemetry unit.  Consults called: Cardiology consulted by EDP.  Admission status: Observation status.   Status is: Observation    Terry LOISE Hurst MD [facility] Pager [phone]  If 7PM-7AM, please contact night-coverage [email or web address] Password [facility]  12/21/2023, 9:46 PM

## 2023-12-21 NOTE — ED Provider Notes (Signed)
 [place] EMERGENCY DEPARTMENT AT [facility] Provider Note   CSN: [id number] Arrival date & time: 12/21/23  8283     Patient presents with: Loss of Consciousness   [name] [name] is a 83 y.o. male.   This is a 83 year old male presenting emergency department after syncopal episode.  Reports having knee replacement on his right knee 2 weeks ago and has been doing well.  Today needed Percocets and tramadol  for breakthrough pain.  Wife notes he has not been eating well for the past few days.  He ambulated to the bathroom to urinate, and was going to take a shower.  When he got to the bathroom was noted to be pale felt lightheaded.  He sat down on the commode and then syncopized.  She did not fall off the commode or hit his head.  Had some shortness of breath prior to passing out.  Denies chest pain.  He currently states he feels his normal self without complaints.  Denied headache, vision loss, facial droop, abdominal pain.  No fevers or chills.   Loss of Consciousness      Prior to Admission medications   Medication Sig Start Date End Date Taking? Authorizing Provider  acetaminophen  (TYLENOL ) 500 MG tablet Take 1,000 mg by mouth every 6 (six) hours as needed for headache (pain).    Yes [provider]  acyclovir  (ZOVIRAX ) 400 MG tablet Take 1 tablet (400 mg total) by mouth daily. 04/21/23  Yes   amoxicillin  (AMOXIL ) 500 MG tablet TAKE 4 TABLETS 1 HOUR PRIOR TO PROCEDURES 11/01/21  Yes Raford Riggs, MD  atorvastatin  (LIPITOR ) 40 MG tablet Take 1 tablet (40 mg total) by mouth daily. 10/30/23  Yes Raford Riggs, MD  carvedilol  (COREG ) 6.25 MG tablet Take 1 tablet (6.25 mg total) by mouth 2 (two) times daily. 10/30/23  Yes Raford Riggs, MD  docusate sodium  (COLACE) 100 MG capsule Take 100 mg by mouth daily as needed for mild constipation.   Yes [provider]  esomeprazole (NEXIUM) 20 MG capsule Take 20 mg by mouth every other day. Reported on  03/01/2015   Yes [provider]  lisinopril  (ZESTRIL ) 20 MG tablet Take 1 tablet (20 mg total) by mouth 2 (two) times daily. 10/30/23  Yes Raford Riggs, MD  methocarbamol  (ROBAXIN ) 500 MG tablet Take 1 tablet (500 mg total) by mouth every 6 (six) hours as needed for muscle spasms. 12/09/23  Yes Edmisten, Kristie L, PA  ondansetron  (ZOFRAN ) 4 MG tablet Take 1 tablet (4 mg total) by mouth every 6 (six) hours as needed for nausea. 12/09/23  Yes Edmisten, Kristie L, PA  oxybutynin  (DITROPAN ) 5 MG tablet Take 1 tablet (5 mg total) by mouth 2 (two) times daily. 03/19/23  Yes   oxyCODONE  (OXY IR/ROXICODONE ) 5 MG immediate release tablet Take 1 tablet (5 mg total) by mouth every 4 (four) hours as needed for severe pain (pain score 7-10). 12/09/23  Yes Edmisten, Kristie L, PA  prednisoLONE  acetate (PRED FORTE ) 1 % ophthalmic suspension Place 1 drop into the left eye daily.   Yes [provider]  rivaroxaban  (XARELTO ) 10 MG TABS tablet Take 1 tablet (10 mg total) by mouth daily with breakfast for 20 days. Then resume one 81 mg aspirin  once a day 12/10/23 12/30/23 Yes Edmisten, Kristie L, PA  spironolactone  (ALDACTONE ) 25 MG tablet Take 0.5 tablets (12.5 mg total) by mouth daily. Please schedule appointment for refills. 07/30/23  Yes Raford Riggs, MD  timolol  (TIMOPTIC ) 0.5 %  ophthalmic solution Place 1 drop into the left eye 2 (two) times daily. 07/14/23  Yes   traMADol  (ULTRAM ) 50 MG tablet Take 1-2 tablets (50-100 mg total) by mouth every 6 (six) hours as needed for moderate pain (pain score 4-6). 12/09/23  Yes Edmisten, Kristie L, PA  methimazole  (TAPAZOLE ) 5 MG tablet Take 1 tablet (5 mg total) by mouth daily. Patient not taking: Reported on 12/21/2023 01/20/23     sildenafil (VIAGRA) 100 MG tablet Take 100 mg by mouth daily as needed for erectile dysfunction. Patient not taking: Reported on 12/21/2023 01/08/22   [provider]    Allergies: Hydrochlorothiazide and  Regadenoson     Review of Systems  Cardiovascular:  Positive for syncope.    Updated Vital Signs BP 137/82 (BP Location: Right Arm)   Pulse 81   Temp 98.2 F (36.8 C) (Oral)   Resp 20   Ht 6' 1 (1.854 m)   SpO2 97%   BMI 22.69 kg/m   Physical Exam Vitals and nursing note reviewed.  Constitutional:      General: He is not in acute distress.    Appearance: He is not toxic-appearing.  HENT:     Head: Normocephalic and atraumatic.     Nose: Nose normal.     Mouth/Throat:     Mouth: Mucous membranes are moist.  Eyes:     Extraocular Movements: Extraocular movements intact.     Conjunctiva/sclera: Conjunctivae normal.     Pupils: Pupils are equal, round, and reactive to light.  Cardiovascular:     Rate and Rhythm: Normal rate and regular rhythm.     Pulses: Normal pulses.  Pulmonary:     Effort: Pulmonary effort is normal.     Breath sounds: Normal breath sounds.  Abdominal:     General: Abdomen is flat. There is no distension.     Palpations: Abdomen is soft.     Tenderness: There is no abdominal tenderness. There is no guarding or rebound.  Musculoskeletal:     Cervical back: Normal range of motion and neck supple.     Right lower leg: No edema.     Left lower leg: No edema.     Comments: Right knee with some swelling, but no erythema, warmth or crepitus.  No significant shadowing to his bandage  Skin:    General: Skin is warm and dry.     Capillary Refill: Capillary refill takes less than 2 seconds.  Neurological:     General: No focal deficit present.     Mental Status: He is alert and oriented to person, place, and time.  Psychiatric:        Mood and Affect: Mood normal.        Behavior: Behavior normal.     (all labs ordered are listed, but only abnormal results are displayed) Labs Reviewed  CBC - Abnormal; Notable for the following components:      Result Value   WBC 12.9 (*)    RBC 3.15 (*)    Hemoglobin 10.2 (*)    HCT 31.1 (*)    Platelets 437 (*)     All other components within normal limits  COMPREHENSIVE METABOLIC PANEL WITH GFR - Abnormal; Notable for the following components:   Sodium 134 (*)    CO2 21 (*)    Glucose, Bld 153 (*)    BUN 26 (*)    Creatinine, Ser 1.28 (*)    Albumin  3.3 (*)    Total Bilirubin 1.5 (*)  GFR, Estimated 55 (*)    All other components within normal limits  TROPONIN T, HIGH SENSITIVITY - Abnormal; Notable for the following components:   Troponin T High Sensitivity 24 (*)    All other components within normal limits  TROPONIN T, HIGH SENSITIVITY - Abnormal; Notable for the following components:   Troponin T High Sensitivity 23 (*)    All other components within normal limits  URINALYSIS, ROUTINE W REFLEX MICROSCOPIC  CBC WITH DIFFERENTIAL/PLATELET  COMPREHENSIVE METABOLIC PANEL WITH GFR  MAGNESIUM   PHOSPHORUS    EKG: EKG Interpretation Date/Time:  Sunday December 21 2023 17:28:36 EST Ventricular Rate:  66 PR Interval:  206 QRS Duration:  94 QT Interval:  410 QTC Calculation: 430 R Axis:   -67  Text Interpretation: Sinus rhythm Multiple ventricular premature complexes Left anterior fascicular block Abnormal R-wave progression, early transition Confirmed by Neysa Clap [phone]) on 12/21/2023 9:06:30 PM  Radiology: CT Angio Chest PE W and/or Wo Contrast Result Date: 12/21/2023 CLINICAL DATA:  High probability for PE. EXAM: CT ANGIOGRAPHY CHEST WITH CONTRAST TECHNIQUE: Multidetector CT imaging of the chest was performed using the standard protocol during bolus administration of intravenous contrast. Multiplanar CT image reconstructions and MIPs were obtained to evaluate the vascular anatomy. RADIATION DOSE REDUCTION: This exam was performed according to the departmental dose-optimization program which includes automated exposure control, adjustment of the mA and/or kV according to patient size and/or use of iterative reconstruction technique. CONTRAST:  75mL OMNIPAQUE  IOHEXOL  350 MG/ML SOLN  COMPARISON:  CT chest 07/24/2006 FINDINGS: Cardiovascular: Satisfactory opacification of the pulmonary arteries to the segmental level. No evidence of pulmonary embolism. Normal heart size. No pericardial effusion. There are atherosclerotic calcifications of the aorta. Aortic valve replacement present. Mediastinum/Nodes: No enlarged mediastinal, hilar, or axillary lymph nodes. Thyroid  gland, trachea, and esophagus demonstrate no significant findings. Lungs/Pleura: There is stable mild elevation of the left hemidiaphragm. There are atelectatic changes in the bilateral lower lobes. There is no pleural effusion or pneumothorax. There are few scattered nodular densities in the upper lobes measuring up to 4 mm. Upper Abdomen: Right renal cyst with peripheral calcifications present. Simple left renal cyst partially imaged. There are punctate calculi in the left kidney. Musculoskeletal: Sternotomy wires are present. No acute fractures are seen. Review of the MIP images confirms the above findings. IMPRESSION: 1. No evidence for pulmonary embolism. 2. Bibasilar atelectasis. 3. Multiple pulmonary nodules. Most significant: Solid pulmonary nodule within the upper lobe measuring 4 mm. No follow-up needed if patient is low-risk (and has no known or suspected primary neoplasm). Non-contrast chest CT can be considered in 12 months if patient is high-risk. This recommendation follows the consensus statement: Guidelines for Management of Incidental Pulmonary Nodules Detected on CT Images: From the Fleischner Society 2017; Radiology 2017; 284:228-243. 4. Aortic atherosclerosis. Aortic Atherosclerosis (ICD10-I70.0). Electronically Signed   By: Greig Pique M.D.   On: 12/21/2023 19:50   DG Chest 2 View Result Date: 12/21/2023 CLINICAL DATA:  Syncope EXAM: CHEST - 2 VIEW COMPARISON:  CHEST x-ray 07/08/2023 FINDINGS: Sternotomy wires are present. The heart size and mediastinal contours are within normal limits. Both lungs are clear.  The visualized skeletal structures are unremarkable.There is stable elevation of the left hemidiaphragm. IMPRESSION: No active cardiopulmonary disease. Electronically Signed   By: Greig Pique M.D.   On: 12/21/2023 18:42     Procedures   Medications Ordered in the ED  enoxaparin  (LOVENOX ) injection 40 mg (40 mg Subcutaneous Given 12/21/23 2315)  acetaminophen  (TYLENOL ) tablet 500  mg (has no administration in time range)  prochlorperazine (COMPAZINE) injection 5 mg (has no administration in time range)  melatonin tablet 5 mg (has no administration in time range)  polyethylene glycol (MIRALAX  / GLYCOLAX ) packet 17 g (has no administration in time range)  lactated ringers  infusion ( Intravenous New Bag/Given 12/21/23 2315)  lactated ringers  bolus 1,000 mL (0 mLs Intravenous Stopped 12/21/23 2020)  iohexol  (OMNIPAQUE ) 350 MG/ML injection 75 mL (75 mLs Intravenous Contrast Given 12/21/23 1925)    Clinical Course as of 12/21/23 2347  Sun Dec 21, 2023  1914 Hemoglobin(!): 10.2 2 point drop since last taken.  [TY]  1914 Comprehensive metabolic panel(!) Mild low sodium.  BUN/creatinine appears to be near baseline. [TY]  1914 Troponin T High Sensitivity(!): 24 Mild elevation.  No prior to compare to [TY]  1915 DG Chest 2 View IMPRESSION: No active cardiopulmonary disease.   [TY]  2017 CT Angio Chest PE W and/or Wo Contrast IMPRESSION: 1. No evidence for pulmonary embolism. 2. Bibasilar atelectasis. 3. Multiple pulmonary nodules. Most significant: Solid pulmonary nodule within the upper lobe measuring 4 mm. No follow-up needed if patient is low-risk (and has no known or suspected primary neoplasm). Non-contrast chest CT can be considered in 12 months if patient is high-risk. This recommendation follows the consensus statement: Guidelines for Management of Incidental Pulmonary Nodules Detected on CT Images: From the Fleischner Society 2017; Radiology 2017; 284:228-243. 4. Aortic  atherosclerosis.  Aortic Atherosclerosis (ICD10-I70.0).   Electronically Signed   By: Greig Pique M.D.   [TY]  2130 Spoke with cardiology, recommending admission with telemetry and repeat echo.  [TY]    Clinical Course User Index [TY] Neysa Caron PARAS, DO                                 Medical Decision Making This is a 83 year old male complicated past medical history to include hypertension, hyperlipidemia, status post aortic valve replacement, history of CABG/CAD as well as A-fib.  He is afebrile nontachycardic, appears to be normal sinus rhythm 70 bpm on the monitor as interpreted by me.  EKG similar as interpreted by me with no ST segment changes to indicate ischemia.  No QTc prolongation.  Normal intervals.  Does not have any localizing deficits on exam.  Lungs are clear.  Equal pulses.  Given his recent surgery concern for possible PE, but he is on Xarelto .  Will get CTA.  Will give fluids, check orthostatics.  See ED course for further MDM and final disposition.  Amount and/or Complexity of Data Reviewed Independent Historian:     Details: Wife at bedside notes only out for few seconds.  Did not follow-up with commode and that he was quite pale when he was complaining of his lightheadedness. External Data Reviewed:     Details: On Xarelto .  Appears to have had a stress test in June, deemed to be low risk.  Recent echo showed: IMPRESSIONS    1. Hypokinesis of the basal lateral wall.. Left ventricular ejection  fraction, by estimation, is 55 to 60%. The left ventricle has normal  function. The left ventricle has no regional wall motion abnormalities.  Left ventricular diastolic parameters are  consistent with Grade I diastolic dysfunction (impaired relaxation).  Elevated left atrial pressure.   2. Right ventricular systolic function is normal. The right ventricular  size is mildly enlarged.   3. Right atrial size was moderately dilated.  4. The mitral valve is normal in  structure. Mild mitral valve  regurgitation.   5. S/p AVR (25 mm Magna Ease bioprosthesis; implant date 09/2014) Peak and  mean gradients through the valve are 28 and 16 mm Hg respectively AVA  (VTI) is 0.94 cm2 DVI is 0.33. Compared to echo from 2023, mean gradient  is relatively unchanged (14 to 16  mmHg). The aortic valve has been repaired/replaced. Aortic valve  regurgitation is not visualized.   6. Pulmonic valve regurgitation is moderate.   7. The inferior vena cava is normal in size with greater than 50%  respiratory variability, suggesting right atrial pressure of 3 mmHg.   Labs: ordered. Decision-making details documented in ED Course. Radiology: ordered and independent interpretation performed. Decision-making details documented in ED Course. ECG/medicine tests: independent interpretation performed.    Details: See above  Risk Prescription drug management. Decision regarding hospitalization. Risk Details: Lives with wife.         Final diagnoses:  Syncope and collapse    ED Discharge Orders     None          Neysa Caron PARAS, DO 12/21/23 2347

## 2023-12-21 NOTE — ED Notes (Signed)
 ED TO INPATIENT HANDOFF REPORT  ED Nurse Name and Phone #:  Manuelita [phone]  S Name/Age/Gender [name] [name] 83 y.o. male Room/Bed: WA09/WA09  Code Status   Code Status: Full Code  Home/SNF/Other Home Patient oriented to: self, place, time, and situation Is this baseline? Yes   Triage Complete: Triage complete  Chief Complaint Syncope [R55]  Triage Note Pt arriving via [facility] for syncopal episode at home witnessed by his wife. Pt was ambulating to bathroom and had episode when trying to sit down. Pt recently had a right knee replacement approx 2 weeks ago. Pt A&O x4.    Allergies Allergies  Allergen Reactions   Hydrochlorothiazide Swelling    Lips swelled   Regadenoson  Other (See Comments)    Hypotension, decreased HR after given for NM Lexi Scan. See note on 07/08/23 by RN McKenzie for more information.    Level of Care/Admitting Diagnosis ED Disposition     ED Disposition  Admit   Condition  --   Comment  Hospital Area: [facility] [[id number]]  Level of Care: Telemetry [5]  Admit to tele based on following criteria: Other see comments  Comments: High risk  May place patient in observation at [facility] or [facility] if equivalent level of care is available:: Yes  Diagnosis: Syncope [206001]  Admitting Physician: SHONA TERRY SAILOR [[id number]]  Attending Physician: SHONA TERRY SAILOR [[id number]]          B Medical/Surgery History Past Medical History:  Diagnosis Date   A-fib (HCC)    Arthritis    knee   Cataracts, bilateral    Chronic kidney disease    stage 2   Coronary artery disease    Fatigue 04/05/2020   GERD (gastroesophageal reflux disease)    Glaucoma    Hearing loss    wears hearing aids   History of kidney stones    passed stones and 1 time had surgery   Hx of CABG    Hyperlipidemia    Hypertension    Inguinal hernia 03/2019   Orthostatic hypotension 11/01/2021   Prostate cancer (HCC)    Pure hypercholesterolemia  03/23/2019   S/P aortic valve replacement with bioprosthetic valve 03/23/2019   Bioprosthetic 09/2014   Shingles    Varicose veins 03/31/2015   Wears partial dentures    upper   Past Surgical History:  Procedure Laterality Date   AORTIC VALVE REPLACEMENT N/A 09/28/2014   Procedure: AORTIC VALVE REPLACEMENT (AVR);  Surgeon: Elspeth JAYSON Millers, MD;  Location: [facility] OR;  Service: Open Heart Surgery;  Laterality: N/A;   CARDIAC CATHETERIZATION N/A 09/23/2014   Procedure: Left Heart Cath and Coronary Angiography;  Surgeon: Alm LELON Clay, MD;  Location: [facility] INVASIVE CV LAB;  Service: Cardiovascular;  Laterality: N/A;   CATARACT EXTRACTION Bilateral    CHOLECYSTECTOMY N/A 10/28/2016   Procedure: LAPAROSCOPIC CHOLECYSTECTOMY;  Surgeon: Rubin Calamity, MD;  Location: [facility] OR;  Service: General;  Laterality: N/A;   COLONOSCOPY     CORONARY ARTERY BYPASS GRAFT N/A 09/28/2014   Procedure: CORONARY ARTERY BYPASS GRAFTING (CABG) x4 using left internal mammory artery and right saphenous leg vein harvested endoscopically.;  Surgeon: Elspeth JAYSON Millers, MD;  Location: [facility] OR;  Service: Open Heart Surgery;  Laterality: N/A;   INGUINAL HERNIA REPAIR Right 04/12/2019   Procedure: LAPAROSCOPIC RIGHT INGUINAL HERNIA REPAIR WITH MESH;  Surgeon: Rubin Calamity, MD;  Location: [facility] OR;  Service: General;  Laterality: Right;   kidney stone removal     PROSTATECTOMY  TEE WITHOUT CARDIOVERSION N/A 09/28/2014   Procedure: TRANSESOPHAGEAL ECHOCARDIOGRAM (TEE);  Surgeon: Elspeth JAYSON Millers, MD;  Location: [facility] OR;  Service: Open Heart Surgery;  Laterality: N/A;   TONSILLECTOMY     TOTAL KNEE ARTHROPLASTY Left 03/12/2021   Procedure: TOTAL KNEE ARTHROPLASTY;  Surgeon: Melodi Lerner, MD;  Location: [facility] ORS;  Service: Orthopedics;  Laterality: Left;   TOTAL KNEE ARTHROPLASTY Right 12/08/2023   Procedure: ARTHROPLASTY, KNEE, TOTAL;  Surgeon: Melodi Lerner, MD;  Location: [facility] ORS;  Service: Orthopedics;  Laterality:  Right;   WISDOM TOOTH EXTRACTION       A IV Location/Drains/Wounds Patient Lines/Drains/Airways Status     Active Line/Drains/Airways     Name Placement date Placement time Site Days   Peripheral IV 12/21/23 18 G Left Antecubital 12/21/23  --  Antecubital  less than 1   Wound 12/08/23 1340 Surgical Closed Surgical Incision Knee Right 12/08/23  1340  Knee  13            Intake/Output Last 24 hours  Intake/Output Summary (Last 24 hours) at 12/21/2023 2203 Last data filed at 12/21/2023 1733 Gross per 24 hour  Intake 500 ml  Output --  Net 500 ml    Labs/Imaging Results for orders placed or performed during the hospital encounter of 12/21/23 (from the past 48 hours)  CBC     Status: Abnormal   Collection Time: 12/21/23  6:06 PM  Result Value Ref Range   WBC 12.9 (H) 4.0 - 10.5 K/uL   RBC 3.15 (L) 4.22 - 5.81 MIL/uL   Hemoglobin 10.2 (L) 13.0 - 17.0 g/dL   HCT 68.8 (L) 60.9 - 47.9 %   MCV 98.7 80.0 - 100.0 fL   MCH 32.4 26.0 - 34.0 pg   MCHC 32.8 30.0 - 36.0 g/dL   RDW 87.7 88.4 - 84.4 %   Platelets 437 (H) 150 - 400 K/uL   nRBC 0.0 0.0 - 0.2 %    Comment: Performed at [facility], [address] [place], [place] [address]  Comprehensive metabolic panel     Status: Abnormal   Collection Time: 12/21/23  6:06 PM  Result Value Ref Range   Sodium 134 (L) 135 - 145 mmol/L   Potassium 4.3 3.5 - 5.1 mmol/L   Chloride 102 98 - 111 mmol/L   CO2 21 (L) 22 - 32 mmol/L   Glucose, Bld 153 (H) 70 - 99 mg/dL    Comment: Glucose reference range applies only to samples taken after fasting for at least 8 hours.   BUN 26 (H) 8 - 23 mg/dL   Creatinine, Ser 8.71 (H) 0.61 - 1.24 mg/dL   Calcium  8.9 8.9 - 10.3 mg/dL   Total Protein 6.5 6.5 - 8.1 g/dL   Albumin  3.3 (L) 3.5 - 5.0 g/dL   AST 40 15 - 41 U/L    Comment: HEMOLYSIS AT THIS LEVEL MAY AFFECT RESULT   ALT 41 0 - 44 U/L   Alkaline Phosphatase 79 38 - 126 U/L   Total Bilirubin 1.5 (H) 0.0 - 1.2 mg/dL    GFR, Estimated 55 (L) >60 mL/min    Comment: (NOTE) Calculated using the CKD-EPI Creatinine Equation (2021)    Anion gap 10 5 - 15    Comment: Performed at [facility], [address] [place], [place] [address]  Troponin T, High Sensitivity     Status: Abnormal   Collection Time: 12/21/23  6:06 PM  Result Value Ref Range  Troponin T High Sensitivity 24 (H) 0 - 19 ng/L    Comment: (NOTE) Biotin concentrations > 1000 ng/mL falsely decrease TnT results.  Serial cardiac troponin measurements are suggested.  Refer to the Links section for chest pain algorithms and additional  guidance. Performed at [facility], [address], [place], [place] [address]   Troponin T, High Sensitivity     Status: Abnormal   Collection Time: 12/21/23  8:20 PM  Result Value Ref Range   Troponin T High Sensitivity 23 (H) 0 - 19 ng/L    Comment: (NOTE) Biotin concentrations > 1000 ng/mL falsely decrease TnT results.  Serial cardiac troponin measurements are suggested.  Refer to the Links section for chest pain algorithms and additional  guidance. Performed at [facility], [address] [place], [place] [address]    CT Angio Chest PE W and/or Wo Contrast Result Date: 12/21/2023 CLINICAL DATA:  High probability for PE. EXAM: CT ANGIOGRAPHY CHEST WITH CONTRAST TECHNIQUE: Multidetector CT imaging of the chest was performed using the standard protocol during bolus administration of intravenous contrast. Multiplanar CT image reconstructions and MIPs were obtained to evaluate the vascular anatomy. RADIATION DOSE REDUCTION: This exam was performed according to the departmental dose-optimization program which includes automated exposure control, adjustment of the mA and/or kV according to patient size and/or use of iterative reconstruction technique. CONTRAST:  75mL OMNIPAQUE  IOHEXOL  350 MG/ML SOLN COMPARISON:  CT chest 07/24/2006 FINDINGS:  Cardiovascular: Satisfactory opacification of the pulmonary arteries to the segmental level. No evidence of pulmonary embolism. Normal heart size. No pericardial effusion. There are atherosclerotic calcifications of the aorta. Aortic valve replacement present. Mediastinum/Nodes: No enlarged mediastinal, hilar, or axillary lymph nodes. Thyroid  gland, trachea, and esophagus demonstrate no significant findings. Lungs/Pleura: There is stable mild elevation of the left hemidiaphragm. There are atelectatic changes in the bilateral lower lobes. There is no pleural effusion or pneumothorax. There are few scattered nodular densities in the upper lobes measuring up to 4 mm. Upper Abdomen: Right renal cyst with peripheral calcifications present. Simple left renal cyst partially imaged. There are punctate calculi in the left kidney. Musculoskeletal: Sternotomy wires are present. No acute fractures are seen. Review of the MIP images confirms the above findings. IMPRESSION: 1. No evidence for pulmonary embolism. 2. Bibasilar atelectasis. 3. Multiple pulmonary nodules. Most significant: Solid pulmonary nodule within the upper lobe measuring 4 mm. No follow-up needed if patient is low-risk (and has no known or suspected primary neoplasm). Non-contrast chest CT can be considered in 12 months if patient is high-risk. This recommendation follows the consensus statement: Guidelines for Management of Incidental Pulmonary Nodules Detected on CT Images: From the Fleischner Society 2017; Radiology 2017; 284:228-243. 4. Aortic atherosclerosis. Aortic Atherosclerosis (ICD10-I70.0). Electronically Signed   By: Greig Pique M.D.   On: 12/21/2023 19:50   DG Chest 2 View Result Date: 12/21/2023 CLINICAL DATA:  Syncope EXAM: CHEST - 2 VIEW COMPARISON:  CHEST x-ray 07/08/2023 FINDINGS: Sternotomy wires are present. The heart size and mediastinal contours are within normal limits. Both lungs are clear. The visualized skeletal structures are  unremarkable.There is stable elevation of the left hemidiaphragm. IMPRESSION: No active cardiopulmonary disease. Electronically Signed   By: Greig Pique M.D.   On: 12/21/2023 18:42    Pending Labs Unresulted Labs (From admission, onward)     Start     Ordered   12/28/23 0500  Creatinine, serum  (enoxaparin  (LOVENOX )    CrCl >/= 30 ml/min)  Weekly,  R     Comments: while on enoxaparin  therapy    12/21/23 2143   12/22/23 0500  CBC with Differential/Platelet  Tomorrow morning,   R        12/21/23 2145   12/22/23 0500  Comprehensive metabolic panel  Tomorrow morning,   R        12/21/23 2145   12/22/23 0500  Magnesium   Tomorrow morning,   R        12/21/23 2145   12/22/23 0500  Phosphorus  Tomorrow morning,   R        12/21/23 2145   12/21/23 2142  Urinalysis, Routine w reflex microscopic -Urine, Clean Catch  Once,   URGENT       Question:  Specimen Source  Answer:  Urine, Clean Catch   12/21/23 2141            Vitals/Pain Today's Vitals   12/21/23 1953 12/21/23 2000 12/21/23 2030 12/21/23 2147  BP: 129/84 130/76 (!) 136/91 126/79  Pulse: 77 69 74   Resp: 17 19 (!) 23 (!) 25  Temp:      TempSrc:      SpO2: 99% 92% 96%     Isolation Precautions No active isolations  Medications Medications  enoxaparin  (LOVENOX ) injection 40 mg (has no administration in time range)  acetaminophen  (TYLENOL ) tablet 500 mg (has no administration in time range)  prochlorperazine (COMPAZINE) injection 5 mg (has no administration in time range)  melatonin tablet 5 mg (has no administration in time range)  polyethylene glycol (MIRALAX  / GLYCOLAX ) packet 17 g (has no administration in time range)  lactated ringers  infusion (has no administration in time range)  lactated ringers  bolus 1,000 mL (0 mLs Intravenous Stopped 12/21/23 2020)  iohexol  (OMNIPAQUE ) 350 MG/ML injection 75 mL (75 mLs Intravenous Contrast Given 12/21/23 1925)    Mobility walks with device     Focused Assessments      R Recommendations: See Admitting Provider Note  Report given to:   Additional Notes:

## 2023-12-22 ENCOUNTER — Observation Stay (HOSPITAL_COMMUNITY)

## 2023-12-22 DIAGNOSIS — D649 Anemia, unspecified: Secondary | ICD-10-CM | POA: Diagnosis not present

## 2023-12-22 DIAGNOSIS — R55 Syncope and collapse: Secondary | ICD-10-CM | POA: Diagnosis not present

## 2023-12-22 DIAGNOSIS — I6782 Cerebral ischemia: Secondary | ICD-10-CM | POA: Diagnosis not present

## 2023-12-22 DIAGNOSIS — I672 Cerebral atherosclerosis: Secondary | ICD-10-CM | POA: Diagnosis not present

## 2023-12-22 LAB — COMPREHENSIVE METABOLIC PANEL WITH GFR
ALT: 36 U/L (ref 0–44)
AST: 31 U/L (ref 15–41)
Albumin: 3.3 g/dL — ABNORMAL LOW (ref 3.5–5.0)
Alkaline Phosphatase: 78 U/L (ref 38–126)
Anion gap: 8 (ref 5–15)
BUN: 25 mg/dL — ABNORMAL HIGH (ref 8–23)
CO2: 26 mmol/L (ref 22–32)
Calcium: 9.1 mg/dL (ref 8.9–10.3)
Chloride: 102 mmol/L (ref 98–111)
Creatinine, Ser: 1.15 mg/dL (ref 0.61–1.24)
GFR, Estimated: >60 mL/min (ref >60)
Glucose, Bld: 128 mg/dL — ABNORMAL HIGH (ref 70–99)
Potassium: 4 mmol/L (ref 3.5–5.1)
Sodium: 135 mmol/L (ref 135–145)
Total Bilirubin: 1.2 mg/dL (ref 0.0–1.2)
Total Protein: 6.2 g/dL — ABNORMAL LOW (ref 6.5–8.1)

## 2023-12-22 LAB — MAGNESIUM: Magnesium: 2 mg/dL (ref 1.7–2.4)

## 2023-12-22 LAB — ECHOCARDIOGRAM COMPLETE
AR max vel: 1.35 cm2
AV Area VTI: 1.5 cm2
AV Area mean vel: 1.49 cm2
AV Mean grad: 24.7 mmHg
AV Peak grad: 48.3 mmHg
Ao pk vel: 3.47 m/s
Area-P 1/2: 5.84 cm2
Height: 73 in
MV VTI: 2.8 cm2
S' Lateral: 2.5 cm

## 2023-12-22 LAB — CBC WITH DIFFERENTIAL/PLATELET
Abs Immature Granulocytes: 0.09 K/uL — ABNORMAL HIGH (ref 0.00–0.07)
Basophils Absolute: 0 K/uL (ref 0.0–0.1)
Basophils Relative: 0 %
Eosinophils Absolute: 0.2 K/uL (ref 0.0–0.5)
Eosinophils Relative: 1 %
HCT: 28.5 % — ABNORMAL LOW (ref 39.0–52.0)
Hemoglobin: 9.4 g/dL — ABNORMAL LOW (ref 13.0–17.0)
Immature Granulocytes: 1 %
Lymphocytes Relative: 9 %
Lymphs Abs: 0.9 K/uL (ref 0.7–4.0)
MCH: 31.9 pg (ref 26.0–34.0)
MCHC: 33 g/dL (ref 30.0–36.0)
MCV: 96.6 fL (ref 80.0–100.0)
Monocytes Absolute: 1 K/uL (ref 0.1–1.0)
Monocytes Relative: 9 %
Neutro Abs: 8.4 K/uL — ABNORMAL HIGH (ref 1.7–7.7)
Neutrophils Relative %: 80 %
Platelets: 417 K/uL — ABNORMAL HIGH (ref 150–400)
RBC: 2.95 MIL/uL — ABNORMAL LOW (ref 4.22–5.81)
RDW: 12.3 % (ref 11.5–15.5)
WBC: 10.6 K/uL — ABNORMAL HIGH (ref 4.0–10.5)
nRBC: 0 % (ref 0.0–0.2)

## 2023-12-22 LAB — URINALYSIS, W/ REFLEX TO CULTURE (INFECTION SUSPECTED)
Bacteria, UA: NONE SEEN
Bilirubin Urine: NEGATIVE
Glucose, UA: NEGATIVE mg/dL
Hgb urine dipstick: NEGATIVE
Ketones, ur: NEGATIVE mg/dL
Leukocytes,Ua: NEGATIVE
Nitrite: NEGATIVE
Protein, ur: NEGATIVE mg/dL
Specific Gravity, Urine: >1.046 — ABNORMAL HIGH (ref 1.005–1.030)
pH: 6 (ref 5.0–8.0)

## 2023-12-22 LAB — PHOSPHORUS: Phosphorus: 3.1 mg/dL (ref 2.5–4.6)

## 2023-12-22 MED ORDER — OXYCODONE HCL 5 MG PO TABS: 5.0000 mg | ORAL_TABLET | ORAL | Status: DC | PRN

## 2023-12-22 MED ORDER — METHOCARBAMOL 500 MG PO TABS
500.0000 mg | ORAL_TABLET | Freq: Four times a day (QID) | ORAL | Status: DC | PRN
  Administered 2023-12-22: 500 mg via ORAL
  Filled 2023-12-22: qty 1

## 2023-12-22 MED ORDER — PREDNISOLONE ACETATE 1 % OP SUSP
1.0000 [drp] | Freq: Every day | OPHTHALMIC | Status: DC
  Administered 2023-12-22 – 2023-12-24 (×3): 1 [drp] via OPHTHALMIC
  Filled 2023-12-22: qty 5

## 2023-12-22 MED ORDER — ATORVASTATIN CALCIUM 40 MG PO TABS
40.0000 mg | ORAL_TABLET | Freq: Every day | ORAL | Status: DC
  Administered 2023-12-22 – 2023-12-24 (×3): 40 mg via ORAL
  Filled 2023-12-22 (×3): qty 1

## 2023-12-22 MED ORDER — HYDROMORPHONE HCL 1 MG/ML IJ SOLN: 0.5000 mg | INTRAMUSCULAR | Status: DC | PRN

## 2023-12-22 MED ORDER — TIMOLOL MALEATE 0.5 % OP SOLN
1.0000 [drp] | Freq: Two times a day (BID) | OPHTHALMIC | Status: DC
  Administered 2023-12-22 – 2023-12-24 (×6): 1 [drp] via OPHTHALMIC
  Filled 2023-12-22: qty 5

## 2023-12-22 MED ORDER — RIVAROXABAN 10 MG PO TABS
10.0000 mg | ORAL_TABLET | Freq: Every day | ORAL | Status: DC
  Administered 2023-12-22 – 2023-12-24 (×3): 10 mg via ORAL
  Filled 2023-12-22 (×3): qty 1

## 2023-12-22 MED ORDER — OXYBUTYNIN CHLORIDE 5 MG PO TABS
5.0000 mg | ORAL_TABLET | Freq: Two times a day (BID) | ORAL | Status: DC
  Administered 2023-12-22 – 2023-12-24 (×6): 5 mg via ORAL
  Filled 2023-12-22 (×6): qty 1

## 2023-12-22 MED ORDER — TRAMADOL HCL 50 MG PO TABS
50.0000 mg | ORAL_TABLET | Freq: Four times a day (QID) | ORAL | Status: DC | PRN
  Administered 2023-12-22 – 2023-12-24 (×6): 50 mg via ORAL
  Filled 2023-12-22 (×6): qty 1

## 2023-12-22 MED ORDER — CARVEDILOL 6.25 MG PO TABS
6.2500 mg | ORAL_TABLET | Freq: Two times a day (BID) | ORAL | Status: DC
  Administered 2023-12-22 – 2023-12-23 (×3): 6.25 mg via ORAL
  Filled 2023-12-22 (×3): qty 1

## 2023-12-22 MED ORDER — PANTOPRAZOLE SODIUM 40 MG PO TBEC
40.0000 mg | DELAYED_RELEASE_TABLET | Freq: Every day | ORAL | Status: DC
  Administered 2023-12-22 – 2023-12-24 (×3): 40 mg via ORAL
  Filled 2023-12-22 (×3): qty 1

## 2023-12-22 NOTE — Progress Notes (Signed)
   Subjective:    Patient seen this morning for Dr. Melodi. He is 2 weeks s/p right TKA, admitted last night due to two vasovagal episodes. Reports soreness in the knee yesterday/today but otherwise has been doing fairly well at home.   Objective: Vital signs in last 24 hours: Temp:  [97.8 F (36.6 C)-98.6 F (37 C)] 98.6 F (37 C) (12/01 0737) Pulse Rate:  [64-81] 69 (12/01 0737) Resp:  [16-25] 18 (12/01 0737) BP: (109-141)/(71-91) 141/91 (12/01 0737) SpO2:  [92 %-100 %] 97 % (12/01 0737)  Intake/Output from previous day:  Intake/Output Summary (Last 24 hours) at 12/22/2023 1029 Last data filed at 12/22/2023 0900 Gross per 24 hour  Intake 1253.62 ml  Output 150 ml  Net 1103.62 ml    Intake/Output this shift: Total I/O In: 240 [P.O.:240] Out: -   Labs: Recent Labs    12/21/23 1806 12/22/23 0329  HGB 10.2* 9.4*   Recent Labs    12/21/23 1806 12/22/23 0329  WBC 12.9* 10.6*  RBC 3.15* 2.95*  HCT 31.1* 28.5*  PLT 437* 417*   Recent Labs    12/21/23 1806 12/22/23 0329  NA 134* 135  K 4.3 4.0  CL 102 102  CO2 21* 26  BUN 26* 25*  CREATININE 1.28* 1.15  GLUCOSE 153* 128*  CALCIUM  8.9 9.1   No results for input(s): LABPT, INR in the last 72 hours.  Exam: General - Patient is Alert and Oriented  Extremity -  Aquacel dressing intact with no active drainage.  No erythema or warmth.  Swelling consistent with two weeks postop.  AROM 5-70 degrees  Motor Function - intact, moving foot and toes well on exam.   Past Medical History:  Diagnosis Date   A-fib (HCC)    Arthritis    knee   Cataracts, bilateral    Chronic kidney disease    stage 2   Coronary artery disease    Fatigue 04/05/2020   GERD (gastroesophageal reflux disease)    Glaucoma    Hearing loss    wears hearing aids   History of kidney stones    passed stones and 1 time had surgery   Hx of CABG    Hyperlipidemia    Hypertension    Inguinal hernia 03/2019   Orthostatic  hypotension 11/01/2021   Prostate cancer (HCC)    Pure hypercholesterolemia 03/23/2019   S/P aortic valve replacement with bioprosthetic valve 03/23/2019   Bioprosthetic 09/2014   Shingles    Varicose veins 03/31/2015   Wears partial dentures    upper    Assessment/Plan:    Principal Problem:   Syncope  Estimated body mass index is 22.69 kg/m as calculated from the following:   Height as of this encounter: 6' 1 (1.854 m).   Weight as of 12/08/23: 78 kg.  Patient currently undergoing cardiac workup, knee exam today is consistent with two weeks postop. No abnormalities seen, no signs of infection. Was originally scheduled for routine postop visit in our office tomorrow, we will push this back to next week.  Roxie Mess, PA-C Orthopedic Surgery [phone] 12/22/2023, 10:29 AM

## 2023-12-22 NOTE — Evaluation (Signed)
 Physical Therapy Evaluation Patient Details Name: [name] [name] MRN: [id number] DOB: 04/18/1940 Today's Date: 12/22/2023  History of Present Illness  [name] [name] is an 83 yo male who presents to the ER on 12/21/2023 with complaints of loss of consciousness.  On his way to the bathroom today, he felt woozy, lightheaded, short of breath, sat on the toilet, and called for his wife.  After she arrived in the bathroom, he lost consciousness.  No seizure like movements.  Recently had a right knee replacement on 12/08/2023 by Dr Melodi.  Was in his usual state of health until today. PMH: s/p R TKA on 12/08/23, orthostatic, CAD, syncope, aortic valve replacement, GERD, L TKA 2023, and CABG.  Clinical Impression      Pt admitted with above diagnosis.  Pt currently with functional limitations due to the deficits listed below (see PT Problem List). Pt seated in recliner when PT arrived. Pt reported mild R knee pain, PT noted knee flexion and ed provided to pt and spouse per importance of proper R LE positioning to facilitate knee extension. Pt guided through LE seated TE. Bp assessed with pt reporting no symptoms of orthostatic hypotension, however positive findings, please see below. Pt required S for sit to stand  from recliner to RW, gait tasks in hallway 200 feet with RW and cues for more normalized gait pattern, pt left seated in recliner, towel roll under distal R LE, CP applied and all needs in place, pt encouraged to increased fluid intake. Pt will return home when medically ready and resume OPPT services. Pt will benefit from acute skilled PT to increase their independence and safety with mobility to allow discharge.   Bp seated in recliner at rest 149/91 (65 PR) Bp immediate standing 118/80 (84 PR) Bp s/p 3 min standing 134/98 (81 PR)     If plan is discharge home, recommend the following: Assistance with cooking/housework;Assist for transportation   Can travel by private vehicle         Equipment Recommendations None recommended by PT  Recommendations for Other Services       Functional Status Assessment Patient has had a recent decline in their functional status and demonstrates the ability to make significant improvements in function in a reasonable and predictable amount of time.     Precautions / Restrictions Precautions Precautions: Fall;Knee Restrictions Weight Bearing Restrictions Per Provider Order: No      Mobility  Bed Mobility               General bed mobility comments: pt seated in recliner when PT arrived    Transfers Overall transfer level: Needs assistance Equipment used: Rolling walker (2 wheels) Transfers: Sit to/from Stand Sit to Stand: Supervision           General transfer comment: min cues for proper UE placement    Ambulation/Gait Ambulation/Gait assistance: Supervision Gait Distance (Feet): 200 Feet Assistive device: Rolling walker (2 wheels) Gait Pattern/deviations: Step-through pattern Gait velocity: decreased     General Gait Details: min cues for posture and R knee extension with heel strike  Stairs            Wheelchair Mobility     Tilt Bed    Modified Rankin (Stroke Patients Only)       Balance Overall balance assessment: Mild deficits observed, not formally tested  Pertinent Vitals/Pain Pain Assessment Pain Assessment: 0-10 Pain Score: 5  Pain Location: R knee Pain Descriptors / Indicators: Aching, Constant, Discomfort Pain Intervention(s): Limited activity within patient's tolerance, Monitored during session, Premedicated before session, Repositioned, Ice applied    Home Living Family/patient expects to be discharged to:: Private residence Living Arrangements: Spouse/significant other Available Help at Discharge: Available 24 hours/day;Family Type of Home: House Home Access: Stairs to enter Entrance Stairs-Rails:  Right Entrance Stairs-Number of Steps: 2   Home Layout: One level Home Equipment: Agricultural [employer]ant (2 wheels);Cane - single point Additional Comments: pt participating with OPPT services s/p R TKA    Prior Function Prior Level of Function : Independent/Modified Independent             Mobility Comments: mod I with use of RW for all functional mobility tasks, BADLs ADLs Comments: requires A for IADLs and driving at this time     Extremity/Trunk Assessment        Lower Extremity Assessment Lower Extremity Assessment: RLE deficits/detail RLE Deficits / Details: R knee AROM seated in recliner grossly 5-80 ankle WFL, knee extension 3+/5 with strong cues for TKE knee flexion 4/5 RLE Sensation: WNL    Cervical / Trunk Assessment Cervical / Trunk Assessment: Normal  Communication   Communication Communication: No apparent difficulties    Cognition Arousal: Alert Behavior During Therapy: WFL for tasks assessed/performed   PT - Cognitive impairments: No apparent impairments                         Following commands: Intact       Cueing       General Comments      Exercises Total Joint Exercises Ankle Circles/Pumps: AROM, Both, 10 reps Quad Sets: AROM, Right, 5 reps Heel Slides: AROM, Right, 5 reps Long Arc Quad: AROM, Right, 5 reps   Assessment/Plan    PT Assessment Patient needs continued PT services  PT Problem List Decreased strength;Decreased range of motion;Decreased activity tolerance;Decreased balance;Decreased mobility;Pain       PT Treatment Interventions DME instruction;Gait training;Stair training;Functional mobility training;Therapeutic activities;Therapeutic exercise;Balance training;Neuromuscular re-education;Patient/family education;Modalities    PT Goals (Current goals can be found in the Care Plan section)  Acute Rehab PT Goals Patient Stated Goal: to be able to go home PT Goal Formulation: With patient Time For Goal Achievement:  01/05/24 Potential to Achieve Goals: Good    Frequency Min 3X/week     Co-evaluation               AM-PAC PT 6 Clicks Mobility  Outcome Measure Help needed turning from your back to your side while in a flat bed without using bedrails?: None Help needed moving from lying on your back to sitting on the side of a flat bed without using bedrails?: None Help needed moving to and from a bed to a chair (including a wheelchair)?: A Little Help needed standing up from a chair using your arms (e.g., wheelchair or bedside chair)?: A Little Help needed to walk in hospital room?: A Little Help needed climbing 3-5 steps with a railing? : A Little 6 Click Score: 20    End of Session Equipment Utilized During Treatment: Gait belt Activity Tolerance: Patient tolerated treatment well Patient left: in chair;with call bell/phone within reach;with family/visitor present Nurse Communication: Mobility status;Other (comment) (Bp findings) PT Visit Diagnosis: Other abnormalities of gait and mobility (R26.89);Muscle weakness (generalized) (M62.81);Difficulty in walking, not elsewhere classified (R26.2);Pain Pain - Right/Left: Right  Pain - part of body: Knee;Leg    Time: 8951-8878 PT Time Calculation (min) (ACUTE ONLY): 33 min   Charges:   PT Evaluation $PT Eval Low Complexity: 1 Low PT Treatments $Gait Training: 8-22 mins PT General Charges $$ ACUTE PT VISIT: 1 Visit         [name] [name], PT Acute Rehab   [name] [name] [name] [name] 12/22/2023, 11:31 AM

## 2023-12-22 NOTE — Progress Notes (Signed)
 Pt's wife, Henrry Feil would like to be notified of any updates from MD's on pt's care team. She left for the night and will be back in the morning and doesn't want to miss morning rounds. If she in not at bedside, she would like to be called prior to rounds when giving pt information. Cardiology team and hospitalist notified of pt's and his wife's request.

## 2023-12-22 NOTE — Progress Notes (Addendum)
 [facility]                                                                               Emersyn Kotarski, is a 83 y.o. male, DOB - [birth year], FMW:[id number] Admit date - 12/21/2023    Outpatient Primary MD for the patient is Loreli Kins, MD  LOS - 0  days    Brief summary   KALOB BERGEN is a 83 y.o. male with medical history significant for coronary artery disease status post CABG, aortic stenosis status post AVR, hypertension, hyperlipidemia, history of prior syncope, history of orthostatic hypotension, who presents to the ER with complaints of loss of consciousness.  On his way to the bathroom today, he felt woozy, lightheaded, short of breath, sat on the toilet, and called for his wife.  After she arrived in the bathroom, he lost consciousness.  No seizure like movements.  Recently had a right knee replacement on 12/08/2023 by Dr Melodi.  CT angiogram of the chest was negative for pulmonary embolism.    Assessment & Plan    Assessment and Plan:   Syncope  Differential include vasovagal versus orthostatic hypotension from anemia ( hemoglobin drop from 12 to 9 today) versus probably from pain medication Patient's orthostatic blood pressure parameters earlier this morning were still positive Recommend continue with IV fluids for another 12 hours. No other source of infection found. CT angio of the chest ruled out PE CT of the head ordered for further evaluation Repeat evaluations ordered Echocardiogram showed preserved left ventricular ejection fraction, unchanged from the previous echo. Patient currently denies any chest pain or shortness of breath. Patient's creatinine was elevated at 1.2 on admission and improved to 1.1 with IV fluids    Right knee swelling s/p TKA  Ortho consulted.  Pain control and PT EVAL.    Mild leukocytosis Slowly improving.  No signs of infection found.   History of coronary artery disease status post CABG Mildly  elevated troponins probably from demand ischemia from dehydration, anemia. EKG shows sinus rhythm with PVC'S  Essential hypertension Bp parameters are optimal.  Restart his home meds slowly.    Aortic stenosis s/p AVR   Anemia of blood loss probably  from the surgery.  Baseline hemoglobin around 12, dropped to 10.2 to 9.4 today.  Some component of hemodilution?  Recheck cbc in am.  No obvious signs of bleeding.  Monitor.   Estimated body mass index is 22.69 kg/m as calculated from the following:   Height as of this encounter: 6' 1 (1.854 m).   Weight as of 12/08/23: 78 kg.  Code Status: FULL CODE.  DVT Prophylaxis:  Place TED hose Start: 12/22/23 1219 rivaroxaban  (XARELTO ) tablet 10 mg Start: 12/22/23 0800 rivaroxaban  (XARELTO ) tablet 10 mg   Level of Care: Level of care: Telemetry Family Communication: Updated patient's FAMILY at bedside.   Disposition Plan:     Remains inpatient appropriate:  pending.   Procedures:  echo  Consultants:   Cardiology in am.   Antimicrobials:   Anti-infectives (From admission, onward)    None        Medications  Scheduled Meds:  atorvastatin   40 mg Oral Daily  carvedilol   6.25 mg Oral BID   oxybutynin   5 mg Oral BID   pantoprazole   40 mg Oral Daily   prednisoLONE  acetate  1 drop Left Eye Daily   rivaroxaban   10 mg Oral Q breakfast   timolol   1 drop Left Eye BID   Continuous Infusions:  lactated ringers  75 mL/hr at 12/21/23 2315   PRN Meds:.acetaminophen , HYDROmorphone  (DILAUDID ) injection, melatonin, methocarbamol , polyethylene glycol, prochlorperazine, traMADol     Subjective:   Thom Ollinger was seen and examined today. No dizziness, or chest pain.   Objective:   Vitals:   12/21/23 2256 12/21/23 2344 12/22/23 0235 12/22/23 0737  BP: 137/82  127/85 (!) 141/91  Pulse: 81  77 69  Resp: 20  20 18   Temp: 98.2 F (36.8 C)  98.4 F (36.9 C) 98.6 F (37 C)  TempSrc: Oral  Oral Oral  SpO2: 97%  96% 97%   Height:  6' 1 (1.854 m)      Intake/Output Summary (Last 24 hours) at 12/22/2023 1219 Last data filed at 12/22/2023 0900 Gross per 24 hour  Intake 1253.62 ml  Output 150 ml  Net 1103.62 ml   There were no vitals filed for this visit.   Exam General: Alert and oriented x 3, NAD Cardiovascular: S1 S2 auscultated, no murmurs, RRR Respiratory: Clear to auscultation bilaterally, no wheezing, rales or rhonchi Gastrointestinal: Soft, nontender, nondistended, + bowel sounds Ext: no pedal edema bilaterally Neuro: AAOx3, Cr N's II- XII.    Data Reviewed:  I have personally reviewed following labs and imaging studies   CBC Lab Results  Component Value Date   WBC 10.6 (H) 12/22/2023   RBC 2.95 (L) 12/22/2023   HGB 9.4 (L) 12/22/2023   HCT 28.5 (L) 12/22/2023   MCV 96.6 12/22/2023   MCH 31.9 12/22/2023   PLT 417 (H) 12/22/2023   MCHC 33.0 12/22/2023   RDW 12.3 12/22/2023   LYMPHSABS 0.9 12/22/2023   MONOABS 1.0 12/22/2023   EOSABS 0.2 12/22/2023   BASOSABS 0.0 12/22/2023     Last metabolic panel Lab Results  Component Value Date   NA 135 12/22/2023   K 4.0 12/22/2023   CL 102 12/22/2023   CO2 26 12/22/2023   BUN 25 (H) 12/22/2023   CREATININE 1.15 12/22/2023   GLUCOSE 128 (H) 12/22/2023   GFRNONAA >60 12/22/2023   GFRAA 66 10/05/2019   CALCIUM  9.1 12/22/2023   PHOS 3.1 12/22/2023   PROT 6.2 (L) 12/22/2023   ALBUMIN  3.3 (L) 12/22/2023   BILITOT 1.2 12/22/2023   ALKPHOS 78 12/22/2023   AST 31 12/22/2023   ALT 36 12/22/2023   ANIONGAP 8 12/22/2023    CBG (last 3)  No results for input(s): GLUCAP in the last 72 hours.    Coagulation Profile: No results for input(s): INR, PROTIME in the last 168 hours.   Radiology Studies: ECHOCARDIOGRAM COMPLETE Result Date: 12/22/2023    ECHOCARDIOGRAM REPORT   Patient Name:   WILLIE PLAIN Date of Exam: 12/22/2023 Medical Rec #:  [id number]       Height:       73.0 in Accession #:    [id number]      Weight:        172.0 lb Date of Birth:  [birth year]       BSA:          2.018 m Patient Age:    83 years        BP:  127/85 mmHg Patient Gender: M               HR:           59 bpm. Exam Location:  Inpatient Procedure: 2D Echo (Both Spectral and Color Flow Doppler were utilized during            procedure). Indications:    Syncope  History:        Patient has prior history of Echocardiogram examinations. CAD.                 Aortic Valve: 25 mm Magna bioprosthetic valve is present in the                 aortic position. Procedure Date: 09/2014.  Sonographer:    Charmaine Gaskins Referring Phys: [id number] CAROLE N HALL IMPRESSIONS  1. Left ventricular ejection fraction, by estimation, is 60 to 65%. The left ventricle has normal function. The left ventricle demonstrates regional wall motion abnormalities (see scoring diagram/findings for description). Left ventricular diastolic parameters are consistent with Grade I diastolic dysfunction (impaired relaxation). There is dyskinesis of the left ventricular, basal inferolateral wall.  2. Right ventricular systolic function is normal. The right ventricular size is normal. There is normal pulmonary artery systolic pressure.  3. Left atrial size was moderately dilated.  4. The mitral valve is normal in structure. Trivial mitral valve regurgitation. No evidence of mitral stenosis.  5. The aortic valve has been repaired/replaced. There is mild calcification of the aortic valve. Aortic valve regurgitation is not visualized. Mild to moderate aortic valve stenosis. There is a 25 mm Magna bioprosthetic valve present in the aortic position. Procedure Date: 09/2014. Aortic valve area, by VTI measures 1.50 cm. Aortic valve mean gradient measures 24.7 mmHg. Aortic valve Vmax measures 3.47 m/s.  6. The inferior vena cava is normal in size with greater than 50% respiratory variability, suggesting right atrial pressure of 3 mmHg. Comparison(s): Prior images reviewed side by side.  Conclusion(s)/Recommendation(s): AVR mean gradient slightly increased from earlier this year, but AVA and DI similar to improved. Normal EF with previously noted focal wall abnormalities. FINDINGS  Left Ventricle: Left ventricular ejection fraction, by estimation, is 60 to 65%. The left ventricle has normal function. The left ventricle demonstrates regional wall motion abnormalities. The left ventricular internal cavity size was normal in size. There is no left ventricular hypertrophy. Left ventricular diastolic parameters are consistent with Grade I diastolic dysfunction (impaired relaxation). Right Ventricle: The right ventricular size is normal. No increase in right ventricular wall thickness. Right ventricular systolic function is normal. There is normal pulmonary artery systolic pressure. The tricuspid regurgitant velocity is 2.84 m/s, and  with an assumed right atrial pressure of 3 mmHg, the estimated right ventricular systolic pressure is 35.3 mmHg. Left Atrium: Left atrial size was moderately dilated. Right Atrium: Right atrial size was normal in size. Pericardium: There is no evidence of pericardial effusion. Mitral Valve: The mitral valve is normal in structure. Trivial mitral valve regurgitation. No evidence of mitral valve stenosis. MV peak gradient, 5.4 mmHg. The mean mitral valve gradient is 2.0 mmHg. Tricuspid Valve: The tricuspid valve is normal in structure. Tricuspid valve regurgitation is mild . No evidence of tricuspid stenosis. Aortic Valve: DI 0.39. The aortic valve has been repaired/replaced. There is mild calcification of the aortic valve. Aortic valve regurgitation is not visualized. Mild to moderate aortic stenosis is present. Aortic valve mean gradient measures 24.7 mmHg.  Aortic valve peak gradient measures 48.3  mmHg. Aortic valve area, by VTI measures 1.50 cm. There is a 25 mm Magna bioprosthetic valve present in the aortic position. Procedure Date: 09/2014. Pulmonic Valve: The pulmonic  valve was not well visualized. Pulmonic valve regurgitation is mild. No evidence of pulmonic stenosis. Aorta: The aortic root and ascending aorta are structurally normal, with no evidence of dilitation. Venous: The inferior vena cava is normal in size with greater than 50% respiratory variability, suggesting right atrial pressure of 3 mmHg. IAS/Shunts: The atrial septum is grossly normal.  LEFT VENTRICLE PLAX 2D LVIDd:         3.90 cm   Diastology LVIDs:         2.50 cm   LV e' medial:    5.98 cm/s LV PW:         1.00 cm   LV E/e' medial:  12.3 LV IVS:        1.00 cm   LV e' lateral:   10.40 cm/s LVOT diam:     2.20 cm   LV E/e' lateral: 7.0 LV SV:         98 LV SV Index:   48 LVOT Area:     3.80 cm  RIGHT VENTRICLE RV Basal diam:  2.90 cm RV Mid diam:    2.50 cm RV S prime:     16.50 cm/s LEFT ATRIUM              Index        RIGHT ATRIUM           Index LA diam:        3.00 cm  1.49 cm/m   RA Area:     18.10 cm LA Vol (A2C):   113.0 ml 55.99 ml/m  RA Volume:   44.20 ml  21.90 ml/m LA Vol (A4C):   73.5 ml  36.42 ml/m LA Biplane Vol: 92.8 ml  45.99 ml/m  AORTIC VALVE AV Area (Vmax):    1.35 cm AV Area (Vmean):   1.49 cm AV Area (VTI):     1.50 cm AV Vmax:           347.33 cm/s AV Vmean:          231.000 cm/s AV VTI:            0.651 m AV Peak Grad:      48.3 mmHg AV Mean Grad:      24.7 mmHg LVOT Vmax:         123.00 cm/s LVOT Vmean:        90.500 cm/s LVOT VTI:          0.257 m LVOT/AV VTI ratio: 0.39  AORTA Ao Root diam: 3.40 cm Ao Asc diam:  3.40 cm MITRAL VALVE                TRICUSPID VALVE MV Area (PHT): 5.84 cm     TV Peak grad:   32.7 mmHg MV Area VTI:   2.80 cm     TV Vmax:        2.86 m/s MV Peak grad:  5.4 mmHg     TR Peak grad:   32.3 mmHg MV Mean grad:  2.0 mmHg     TR Vmax:        284.00 cm/s MV Vmax:       1.16 m/s MV Vmean:      66.1 cm/s    SHUNTS MV Decel Time: 130 msec     Systemic  VTI:  0.26 m MV E velocity: 73.30 cm/s   Systemic Diam: 2.20 cm MV A velocity: 102.00 cm/s MV E/A ratio:   0.72 Shelda Bruckner MD Electronically signed by Shelda Bruckner MD Signature Date/Time: 12/22/2023/11:11:58 AM    Final    CT Angio Chest PE W and/or Wo Contrast Result Date: 12/21/2023 CLINICAL DATA:  High probability for PE. EXAM: CT ANGIOGRAPHY CHEST WITH CONTRAST TECHNIQUE: Multidetector CT imaging of the chest was performed using the standard protocol during bolus administration of intravenous contrast. Multiplanar CT image reconstructions and MIPs were obtained to evaluate the vascular anatomy. RADIATION DOSE REDUCTION: This exam was performed according to the departmental dose-optimization program which includes automated exposure control, adjustment of the mA and/or kV according to patient size and/or use of iterative reconstruction technique. CONTRAST:  75mL OMNIPAQUE  IOHEXOL  350 MG/ML SOLN COMPARISON:  CT chest 07/24/2006 FINDINGS: Cardiovascular: Satisfactory opacification of the pulmonary arteries to the segmental level. No evidence of pulmonary embolism. Normal heart size. No pericardial effusion. There are atherosclerotic calcifications of the aorta. Aortic valve replacement present. Mediastinum/Nodes: No enlarged mediastinal, hilar, or axillary lymph nodes. Thyroid  gland, trachea, and esophagus demonstrate no significant findings. Lungs/Pleura: There is stable mild elevation of the left hemidiaphragm. There are atelectatic changes in the bilateral lower lobes. There is no pleural effusion or pneumothorax. There are few scattered nodular densities in the upper lobes measuring up to 4 mm. Upper Abdomen: Right renal cyst with peripheral calcifications present. Simple left renal cyst partially imaged. There are punctate calculi in the left kidney. Musculoskeletal: Sternotomy wires are present. No acute fractures are seen. Review of the MIP images confirms the above findings. IMPRESSION: 1. No evidence for pulmonary embolism. 2. Bibasilar atelectasis. 3. Multiple pulmonary nodules. Most  significant: Solid pulmonary nodule within the upper lobe measuring 4 mm. No follow-up needed if patient is low-risk (and has no known or suspected primary neoplasm). Non-contrast chest CT can be considered in 12 months if patient is high-risk. This recommendation follows the consensus statement: Guidelines for Management of Incidental Pulmonary Nodules Detected on CT Images: From the Fleischner Society 2017; Radiology 2017; 284:228-243. 4. Aortic atherosclerosis. Aortic Atherosclerosis (ICD10-I70.0). Electronically Signed   By: Greig Pique M.D.   On: 12/21/2023 19:50   DG Chest 2 View Result Date: 12/21/2023 CLINICAL DATA:  Syncope EXAM: CHEST - 2 VIEW COMPARISON:  CHEST x-ray 07/08/2023 FINDINGS: Sternotomy wires are present. The heart size and mediastinal contours are within normal limits. Both lungs are clear. The visualized skeletal structures are unremarkable.There is stable elevation of the left hemidiaphragm. IMPRESSION: No active cardiopulmonary disease. Electronically Signed   By: Greig Pique M.D.   On: 12/21/2023 18:42       Elgie Butter M.D. [facility] 12/22/2023, 12:19 PM  Available via Epic secure chat 7am-7pm After 7 pm, please refer to night coverage provider listed on amion.

## 2023-12-22 NOTE — Progress Notes (Signed)
 OT Cancellation Note  Patient Details Name: [name] [name] MRN: [id number] DOB: Nov 03, 1940   Cancelled Treatment:    Reason Eval/Treat Not Completed: Patient at procedure or test/ unavailable  Patient being transported out of room to test. OT will continue to follow for initial evaluation as schedule allows.   Shaylene Paganelli OT/L Acute Rehabilitation Department  [phone]    12/22/2023, 2:20 PM

## 2023-12-23 DIAGNOSIS — I2510 Atherosclerotic heart disease of native coronary artery without angina pectoris: Secondary | ICD-10-CM

## 2023-12-23 DIAGNOSIS — Z7982 Long term (current) use of aspirin: Secondary | ICD-10-CM | POA: Diagnosis not present

## 2023-12-23 DIAGNOSIS — Z79899 Other long term (current) drug therapy: Secondary | ICD-10-CM | POA: Diagnosis not present

## 2023-12-23 DIAGNOSIS — I5032 Chronic diastolic (congestive) heart failure: Secondary | ICD-10-CM | POA: Diagnosis not present

## 2023-12-23 DIAGNOSIS — N1831 Chronic kidney disease, stage 3a: Secondary | ICD-10-CM | POA: Diagnosis not present

## 2023-12-23 DIAGNOSIS — Z951 Presence of aortocoronary bypass graft: Secondary | ICD-10-CM

## 2023-12-23 DIAGNOSIS — Z953 Presence of xenogenic heart valve: Secondary | ICD-10-CM | POA: Diagnosis not present

## 2023-12-23 DIAGNOSIS — I951 Orthostatic hypotension: Secondary | ICD-10-CM | POA: Diagnosis not present

## 2023-12-23 DIAGNOSIS — Z87442 Personal history of urinary calculi: Secondary | ICD-10-CM | POA: Diagnosis not present

## 2023-12-23 DIAGNOSIS — E861 Hypovolemia: Secondary | ICD-10-CM | POA: Diagnosis not present

## 2023-12-23 DIAGNOSIS — Z96651 Presence of right artificial knee joint: Secondary | ICD-10-CM | POA: Diagnosis not present

## 2023-12-23 DIAGNOSIS — Z7901 Long term (current) use of anticoagulants: Secondary | ICD-10-CM | POA: Diagnosis not present

## 2023-12-23 DIAGNOSIS — R918 Other nonspecific abnormal finding of lung field: Secondary | ICD-10-CM | POA: Diagnosis not present

## 2023-12-23 DIAGNOSIS — E7800 Pure hypercholesterolemia, unspecified: Secondary | ICD-10-CM | POA: Diagnosis not present

## 2023-12-23 DIAGNOSIS — D72829 Elevated white blood cell count, unspecified: Secondary | ICD-10-CM | POA: Diagnosis not present

## 2023-12-23 DIAGNOSIS — Z87891 Personal history of nicotine dependence: Secondary | ICD-10-CM | POA: Diagnosis not present

## 2023-12-23 DIAGNOSIS — E860 Dehydration: Secondary | ICD-10-CM | POA: Diagnosis not present

## 2023-12-23 DIAGNOSIS — T82857A Stenosis of cardiac prosthetic devices, implants and grafts, initial encounter: Secondary | ICD-10-CM | POA: Diagnosis not present

## 2023-12-23 DIAGNOSIS — D649 Anemia, unspecified: Secondary | ICD-10-CM | POA: Diagnosis not present

## 2023-12-23 DIAGNOSIS — I2489 Other forms of acute ischemic heart disease: Secondary | ICD-10-CM | POA: Diagnosis not present

## 2023-12-23 DIAGNOSIS — D62 Acute posthemorrhagic anemia: Secondary | ICD-10-CM | POA: Diagnosis not present

## 2023-12-23 DIAGNOSIS — R55 Syncope and collapse: Secondary | ICD-10-CM | POA: Diagnosis not present

## 2023-12-23 DIAGNOSIS — Z8546 Personal history of malignant neoplasm of prostate: Secondary | ICD-10-CM | POA: Diagnosis not present

## 2023-12-23 DIAGNOSIS — Z888 Allergy status to other drugs, medicaments and biological substances status: Secondary | ICD-10-CM | POA: Diagnosis not present

## 2023-12-23 DIAGNOSIS — E871 Hypo-osmolality and hyponatremia: Secondary | ICD-10-CM | POA: Diagnosis not present

## 2023-12-23 DIAGNOSIS — I130 Hypertensive heart and chronic kidney disease with heart failure and stage 1 through stage 4 chronic kidney disease, or unspecified chronic kidney disease: Secondary | ICD-10-CM | POA: Diagnosis not present

## 2023-12-23 LAB — CBC WITH DIFFERENTIAL/PLATELET
Abs Immature Granulocytes: 0.06 K/uL (ref 0.00–0.07)
Basophils Absolute: 0 K/uL (ref 0.0–0.1)
Basophils Relative: 0 %
Eosinophils Absolute: 0.3 K/uL (ref 0.0–0.5)
Eosinophils Relative: 3 %
HCT: 29.5 % — ABNORMAL LOW (ref 39.0–52.0)
Hemoglobin: 9.9 g/dL — ABNORMAL LOW (ref 13.0–17.0)
Immature Granulocytes: 1 %
Lymphocytes Relative: 11 %
Lymphs Abs: 1 K/uL (ref 0.7–4.0)
MCH: 32.4 pg (ref 26.0–34.0)
MCHC: 33.6 g/dL (ref 30.0–36.0)
MCV: 96.4 fL (ref 80.0–100.0)
Monocytes Absolute: 0.9 K/uL (ref 0.1–1.0)
Monocytes Relative: 10 %
Neutro Abs: 6.8 K/uL (ref 1.7–7.7)
Neutrophils Relative %: 75 %
Platelets: 420 K/uL — ABNORMAL HIGH (ref 150–400)
RBC: 3.06 MIL/uL — ABNORMAL LOW (ref 4.22–5.81)
RDW: 12.1 % (ref 11.5–15.5)
WBC: 9 K/uL (ref 4.0–10.5)
nRBC: 0 % (ref 0.0–0.2)

## 2023-12-23 LAB — BASIC METABOLIC PANEL WITH GFR
Anion gap: 8 (ref 5–15)
BUN: 19 mg/dL (ref 8–23)
CO2: 25 mmol/L (ref 22–32)
Calcium: 9.6 mg/dL (ref 8.9–10.3)
Chloride: 103 mmol/L (ref 98–111)
Creatinine, Ser: 1.1 mg/dL (ref 0.61–1.24)
GFR, Estimated: >60 mL/min (ref >60)
Glucose, Bld: 99 mg/dL (ref 70–99)
Potassium: 4.4 mmol/L (ref 3.5–5.1)
Sodium: 137 mmol/L (ref 135–145)

## 2023-12-23 MED ORDER — LACTATED RINGERS IV BOLUS
500.0000 mL | Freq: Once | INTRAVENOUS | Status: AC
  Administered 2023-12-23: 500 mL via INTRAVENOUS

## 2023-12-23 MED ORDER — LACTATED RINGERS IV SOLN: INTRAVENOUS | Status: DC

## 2023-12-23 MED ORDER — SODIUM CHLORIDE 0.9 % IV BOLUS
1000.0000 mL | Freq: Once | INTRAVENOUS | Status: AC
  Administered 2023-12-23: 1000 mL via INTRAVENOUS

## 2023-12-23 NOTE — Consult Note (Signed)
 Cardiology Consultation   Patient ID: [name] [name] MRN: [id number]; DOB: February 25, 1940  Admit date: 12/21/2023 Date of Consult: 12/23/2023  PCP:  [name] Kins, MD   [facility] Providers Cardiologist:  [name] Scarce, MD        Patient Profile: [name] [name] is a 83 y.o. male with a hx of hypertension, CAD s/p CABG (LIMA-->LAD, SVG-->RI/OM1, SVG-->PDA) in 2016, aortic stenosis s/p AVR in 2016, bradycardia, and syncope  who is being seen 12/23/2023 for the evaluation of syncope at the request of [name] Butter MD.  History of Present Illness: [name] [name] patient has a history of syncope in the setting of hypotension suspected to be 2/2 dehydration. Hypertensive medications were titrated down, though eventually re-started due to elevated blood pressure in the office. Patient did have significant hypotension and bradycardia after stress test imaging that was responsive to caffeine .   Patient was recently seen by Dr. Scarce 10/17/23. At that time patient was asymptomatic from a cardiac perspective though was awaiting knee surgery.  Presented to the ED on 11/30 for syncopal episode.  In the ED: BP: 109/74 ECG: sinus rhythm with LAD. TWI in aVL, V1 and V2. Early R wave progression PVC VR 66 {new changes though not specific] CTA chest showed no PE and multiple pulmonary nodules CT head showed chronic age related changes.  Echo showed LVEF 60-65% with RWMA [ dyskinesis of the left ventricular, basal and inferolateral wall] G1 DD. Moderately dilated LA. Mild to moderate AS with AV gradient 24.7 mmHg. [Per echo report focal wall abnormalities noted on previous echo]  Pertinent lab work:  Cr 1.28, similar to previous though did improve to 1.1 WBC 12.9, leukocytosis has now resolved Hgb ~10 Thrombocytosis  Troponin 24->23  Patient has received IV fluids this admission. He was seen by orthopedics, no acute post op issues, they plan to follow up outpatient. Orthostatics  positive, though no significant hypotension.  This morning patient was unable to describe in detail the events leading up to the admission. Did deny chest pain and shortness of breath.   Spoke to wife, [name] [name], this morning who reported that the day he had his syncopal episode was the first day his knee pain had really bothered him. He took an oxycodone  for the first time that morning and slept all day. He then woke up and took a tramadol  and muscle relaxer than afternoon. Wife tried to encourage eating and drinking, but patient was not interested. After laying down all day patient decided to go take a shower. That is when he became woozy and reported weakness. Wife came into the bathroom and found him standing at the sink not feeling well. She encouraged him to sit down on the toilet at which patient then leaned forward and briefly passed out. He then came to and lean backed and passed out again. Wife went to call 911 and by the time she came back he was awake again. Wife reported he did not mentioned chest pain or shortness of breath during this time.   Past Medical History:  Diagnosis Date   A-fib [facility])    Arthritis    knee   Cataracts, bilateral    Chronic kidney disease    stage 2   Coronary artery disease    Fatigue 04/05/2020   GERD (gastroesophageal reflux disease)    Glaucoma    Hearing loss    wears hearing aids   History of kidney stones    passed stones and 1 time had  surgery   Hx of CABG    Hyperlipidemia    Hypertension    Inguinal hernia 03/2019   Orthostatic hypotension 11/01/2021   Prostate cancer (HCC)    Pure hypercholesterolemia 03/23/2019   S/P aortic valve replacement with bioprosthetic valve 03/23/2019   Bioprosthetic 09/2014   Shingles    Varicose veins 03/31/2015   Wears partial dentures    upper    Past Surgical History:  Procedure Laterality Date   AORTIC VALVE REPLACEMENT N/A 09/28/2014   Procedure: AORTIC VALVE REPLACEMENT (AVR);  Surgeon: [name] JAYSON Millers, MD;  Location: [facility] OR;  Service: Open Heart Surgery;  Laterality: N/A;   CARDIAC CATHETERIZATION N/A 09/23/2014   Procedure: Left Heart Cath and Coronary Angiography;  Surgeon: [name] LELON Clay, MD;  Location: [facility] INVASIVE CV LAB;  Service: Cardiovascular;  Laterality: N/A;   CATARACT EXTRACTION Bilateral    CHOLECYSTECTOMY N/A 10/28/2016   Procedure: LAPAROSCOPIC CHOLECYSTECTOMY;  Surgeon: [name] Calamity, MD;  Location: [facility] OR;  Service: General;  Laterality: N/A;   COLONOSCOPY     CORONARY ARTERY BYPASS GRAFT N/A 09/28/2014   Procedure: CORONARY ARTERY BYPASS GRAFTING (CABG) x4 using left internal mammory artery and right saphenous leg vein harvested endoscopically.;  Surgeon: [name] JAYSON Millers, MD;  Location: [facility] OR;  Service: Open Heart Surgery;  Laterality: N/A;   INGUINAL HERNIA REPAIR Right 04/12/2019   Procedure: LAPAROSCOPIC RIGHT INGUINAL HERNIA REPAIR WITH MESH;  Surgeon: [name] Calamity, MD;  Location: [facility] OR;  Service: General;  Laterality: Right;   kidney stone removal     PROSTATECTOMY     TEE WITHOUT CARDIOVERSION N/A 09/28/2014   Procedure: TRANSESOPHAGEAL ECHOCARDIOGRAM (TEE);  Surgeon: [name] JAYSON Millers, MD;  Location: [facility] OR;  Service: Open Heart Surgery;  Laterality: N/A;   TONSILLECTOMY     TOTAL KNEE ARTHROPLASTY Left 03/12/2021   Procedure: TOTAL KNEE ARTHROPLASTY;  Surgeon: [name] Lerner, MD;  Location: [facility] ORS;  Service: Orthopedics;  Laterality: Left;   TOTAL KNEE ARTHROPLASTY Right 12/08/2023   Procedure: ARTHROPLASTY, KNEE, TOTAL;  Surgeon: [name] Lerner, MD;  Location: [facility] ORS;  Service: Orthopedics;  Laterality: Right;   WISDOM TOOTH EXTRACTION         Scheduled Meds:  atorvastatin   40 mg Oral Daily   carvedilol   6.25 mg Oral BID   oxybutynin   5 mg Oral BID   pantoprazole   40 mg Oral Daily   prednisoLONE  acetate  1 drop Left Eye Daily   rivaroxaban   10 mg Oral Q breakfast   timolol   1 drop Left Eye BID   Continuous Infusions:  PRN  Meds: acetaminophen , HYDROmorphone  (DILAUDID ) injection, melatonin, methocarbamol , polyethylene glycol, prochlorperazine, traMADol   Allergies:    Allergies  Allergen Reactions   Hydrochlorothiazide Swelling    Lips swelled   Regadenoson  Other (See Comments)    Hypotension, decreased HR after given for NM Lexi Scan. See note on 07/08/23 by RN McKenzie for more information.    Social History:   Social History   Socioeconomic History   Marital status: Married    Spouse name: Not on file   Number of children: Not on file   Years of education: Not on file   Highest education level: Not on file  Occupational History   Not on file  Tobacco Use   Smoking status: Former    Current packs/day: 0.00    Types: Cigarettes    Quit date: 05/22/1980    Years since quitting: 43.6   Smokeless tobacco: Never   Tobacco comments:  quit smoking in 1982  Vaping Use   Vaping status: Never Used  Substance and Sexual Activity   Alcohol use: Yes    Alcohol/week: 0.0 standard drinks of alcohol    Comment: occasional liquor/ Beer   Drug use: No   Sexual activity: Yes  Other Topics Concern   Not on file  Social History Narrative   Pt leaving with wife.   Social Drivers of Corporate Investment Banker Strain: Not on file  Food Insecurity: No Food Insecurity (12/21/2023)   Hunger Vital Sign    Worried About Running Out of Food in the Last Year: Never true    Ran Out of Food in the Last Year: Never true  Transportation Needs: No Transportation Needs (12/21/2023)   PRAPARE - Administrator, Civil Service (Medical): No    Lack of Transportation (Non-Medical): No  Physical Activity: Not on file  Stress: Not on file  Social Connections: Socially Integrated (12/21/2023)   Social Connection and Isolation Panel    Frequency of Communication with Friends and Family: More than three times a week    Frequency of Social Gatherings with Friends and Family: More than three times a week     Attends Religious Services: More than 4 times per year    Active Member of [employer] or Organizations: Yes    Attends Engineer, Structural: More than 4 times per year    Marital Status: Married  Catering Manager Violence: Not At Risk (12/21/2023)   Humiliation, Afraid, Rape, and Kick questionnaire    Fear of Current or Ex-Partner: No    Emotionally Abused: No    Physically Abused: No    Sexually Abused: No    Family History:   Family History  Problem Relation Age of Onset   Cancer Mother    Ovarian cancer Mother      ROS:  Please see the history of present illness.  All other ROS reviewed and negative.     Physical Exam/Data: Vitals:   12/22/23 2009 12/22/23 2050 12/23/23 0500 12/23/23 0505  BP: 132/79   118/86  Pulse: 67   74  Resp: 18   18  Temp: 98.3 F (36.8 C)   98.2 F (36.8 C)  TempSrc: Oral   Oral  SpO2: 96%   97%  Weight:  76.6 kg 76.8 kg   Height:        Intake/Output Summary (Last 24 hours) at 12/23/2023 0726 Last data filed at 12/22/2023 2323 Gross per 24 hour  Intake 1669.99 ml  Output 550 ml  Net 1119.99 ml      12/23/2023    5:00 AM 12/22/2023    8:50 PM 12/08/2023   12:00 PM  Last 3 Weights  Weight (lbs) 169 lb 5 oz 168 lb 14.4 oz 172 lb  Weight (kg) 76.8 kg 76.613 kg 78.019 kg     Body mass index is 22.34 kg/m.  General:  Well nourished, well developed, in no acute distress HEENT: normal Neck: no JVD Vascular:  Distal pulses 2+ bilaterally Cardiac:  normal S1, S2; RRR; no murmur  Lungs:  clear to auscultation bilaterally, no wheezing, rhonchi or rales  Abd: soft, nontender, no hepatomegaly  Ext: no edema to lower legs, mild edema to right knee with incision covered with clean and dry dressing.  Skin: warm and dry   EKG:  The EKG was personally reviewed and demonstrates:  see HPI above Telemetry:  Telemetry was personally reviewed and demonstrates:  sinus with 1st degree AV block, HR ~ 72  Relevant CV Studies: Echocardiogram  04/2023 IMPRESSIONS     1. Hypokinesis of the basal lateral wall.. Left ventricular ejection  fraction, by estimation, is 55 to 60%. The left ventricle has normal  function. The left ventricle has no regional wall motion abnormalities.  Left ventricular diastolic parameters are  consistent with Grade I diastolic dysfunction (impaired relaxation).  Elevated left atrial pressure.   2. Right ventricular systolic function is normal. The right ventricular  size is mildly enlarged.   3. Right atrial size was moderately dilated.   4. The mitral valve is normal in structure. Mild mitral valve  regurgitation.   5. S/p AVR (25 mm Magna Ease bioprosthesis; implant date 09/2014) Peak and  mean gradients through the valve are 28 and 16 mm Hg respectively AVA  (VTI) is 0.94 cm2 DVI is 0.33. Compared to echo from 2023, mean gradient  is relatively unchanged (14 to 16  mmHg). The aortic valve has been repaired/replaced. Aortic valve  regurgitation is not visualized.   6. Pulmonic valve regurgitation is moderate.   7. The inferior vena cava is normal in size with greater than 50%  respiratory variability, suggesting right atrial pressure of 3 mmHg.    NM Perfusion Test 06/2023   LV perfusion is normal. There is no evidence of ischemia. There is no evidence of infarction.   Rest left ventricular function is abnormal. Rest global function is moderately reduced. There were no regional wall motion abnormalities. Rest EF: 39%. End diastolic cavity size is normal. End systolic cavity size is mildly enlarged.   Myocardial blood flow was computed to be 0.51ml/g/min at rest and 1.101ml/g/min at stress. Global myocardial blood flow reserve was 1.35 and was abnormal.   Coronary calcium  assessment not performed due to prior revascularization. duet to prior CABG   Findings are consistent with no ischemia and no infarction. The study is low risk.   Normal perfusion. Stress EF not sent. Abnormal resting EF no RWMA  suggest echo correlation. Abnormal MBFR in setting of prior CABG  Note patient has bradycardia and hypotension post procedure Caffeine  given with saline and sent to [facility] ER  Laboratory Data:   Chemistry Recent Labs  Lab 12/21/23 1806 12/22/23 0329 12/23/23 0403  NA 134* 135 137  K 4.3 4.0 4.4  CL 102 102 103  CO2 21* 26 25  GLUCOSE 153* 128* 99  BUN 26* 25* 19  CREATININE 1.28* 1.15 1.10  CALCIUM  8.9 9.1 9.6  MG  --  2.0  --   GFRNONAA 55* >60 >60  ANIONGAP 10 8 8     Recent Labs  Lab 12/21/23 1806 12/22/23 0329  PROT 6.5 6.2*  ALBUMIN  3.3* 3.3*  AST 40 31  ALT 41 36  ALKPHOS 79 78  BILITOT 1.5* 1.2   Hematology Recent Labs  Lab 12/21/23 1806 12/22/23 0329 12/23/23 0403  WBC 12.9* 10.6* 9.0  RBC 3.15* 2.95* 3.06*  HGB 10.2* 9.4* 9.9*  HCT 31.1* 28.5* 29.5*  MCV 98.7 96.6 96.4  MCH 32.4 31.9 32.4  MCHC 32.8 33.0 33.6  RDW 12.2 12.3 12.1  PLT 437* 417* 420*    Radiology/Studies:  CT HEAD WO CONTRAST ( ) Result Date: 12/22/2023 EXAM: CT HEAD WITHOUT 12/22/2023 02:45:16 PM TECHNIQUE: CT of the head was performed without the administration of intravenous contrast. Automated exposure control, iterative reconstruction, and/or weight based adjustment of the mA/kV was utilized to reduce the radiation dose to as low as  reasonably achievable. COMPARISON: None available. CLINICAL HISTORY: syncope FINDINGS: BRAIN AND VENTRICLES: No acute intracranial hemorrhage. No mass effect or midline shift. No extra-axial fluid collection. No evidence of acute infarct. No hydrocephalus. Age related atrophy. Mild periventricular chronic small vessel ischemia. ORBITS: Post bilateral cataract resection. Left glaucoma drainage device. SINUSES AND MASTOIDS: No acute abnormality. SOFT TISSUES AND SKULL: No acute skull fracture. No acute soft tissue abnormality. Atherosclerosis of skullbase vasculature. IMPRESSION: 1. No acute intracranial abnormality 2. Age related atrophy and mild periventricular  chronic small vessel ischemia. Electronically signed by: Franky Stanford MD 12/22/2023 11:53 PM EST RP Workstation: [id number]   ECHOCARDIOGRAM COMPLETE Result Date: 12/22/2023    ECHOCARDIOGRAM REPORT   Patient Name:   [name] [name] Date of Exam: 12/22/2023 Medical Rec #:  [id number]       Height:       73.0 in Accession #:    [id number]      Weight:       172.0 lb Date of Birth:  08-Jun-1940       BSA:          2.018 m Patient Age:    83 years        BP:           127/85 mmHg Patient Gender: M               HR:           59 bpm. Exam Location:  Inpatient Procedure: 2D Echo (Both Spectral and Color Flow Doppler were utilized during            procedure). Indications:    Syncope  History:        Patient has prior history of Echocardiogram examinations. CAD.                 Aortic Valve: 25 mm Magna bioprosthetic valve is present in the                 aortic position. Procedure Date: 09/2014.  Sonographer:    Charmaine Gaskins Referring Phys: [id number] CAROLE N HALL IMPRESSIONS  1. Left ventricular ejection fraction, by estimation, is 60 to 65%. The left ventricle has normal function. The left ventricle demonstrates regional wall motion abnormalities (see scoring diagram/findings for description). Left ventricular diastolic parameters are consistent with Grade I diastolic dysfunction (impaired relaxation). There is dyskinesis of the left ventricular, basal inferolateral wall.  2. Right ventricular systolic function is normal. The right ventricular size is normal. There is normal pulmonary artery systolic pressure.  3. Left atrial size was moderately dilated.  4. The mitral valve is normal in structure. Trivial mitral valve regurgitation. No evidence of mitral stenosis.  5. The aortic valve has been repaired/replaced. There is mild calcification of the aortic valve. Aortic valve regurgitation is not visualized. Mild to moderate aortic valve stenosis. There is a 25 mm Magna bioprosthetic valve present in the aortic position.  Procedure Date: 09/2014. Aortic valve area, by VTI measures 1.50 cm. Aortic valve mean gradient measures 24.7 mmHg. Aortic valve Vmax measures 3.47 m/s.  6. The inferior vena cava is normal in size with greater than 50% respiratory variability, suggesting right atrial pressure of 3 mmHg. Comparison(s): Prior images reviewed side by side. Conclusion(s)/Recommendation(s): AVR mean gradient slightly increased from earlier this year, but AVA and DI similar to improved. Normal EF with previously noted focal wall abnormalities. FINDINGS  Left Ventricle: Left ventricular ejection fraction, by estimation, is 60 to 65%. The left  ventricle has normal function. The left ventricle demonstrates regional wall motion abnormalities. The left ventricular internal cavity size was normal in size. There is no left ventricular hypertrophy. Left ventricular diastolic parameters are consistent with Grade I diastolic dysfunction (impaired relaxation). Right Ventricle: The right ventricular size is normal. No increase in right ventricular wall thickness. Right ventricular systolic function is normal. There is normal pulmonary artery systolic pressure. The tricuspid regurgitant velocity is 2.84 m/s, and  with an assumed right atrial pressure of 3 mmHg, the estimated right ventricular systolic pressure is 35.3 mmHg. Left Atrium: Left atrial size was moderately dilated. Right Atrium: Right atrial size was normal in size. Pericardium: There is no evidence of pericardial effusion. Mitral Valve: The mitral valve is normal in structure. Trivial mitral valve regurgitation. No evidence of mitral valve stenosis. MV peak gradient, 5.4 mmHg. The mean mitral valve gradient is 2.0 mmHg. Tricuspid Valve: The tricuspid valve is normal in structure. Tricuspid valve regurgitation is mild . No evidence of tricuspid stenosis. Aortic Valve: DI 0.39. The aortic valve has been repaired/replaced. There is mild calcification of the aortic valve. Aortic valve  regurgitation is not visualized. Mild to moderate aortic stenosis is present. Aortic valve mean gradient measures 24.7 mmHg.  Aortic valve peak gradient measures 48.3 mmHg. Aortic valve area, by VTI measures 1.50 cm. There is a 25 mm Magna bioprosthetic valve present in the aortic position. Procedure Date: 09/2014. Pulmonic Valve: The pulmonic valve was not well visualized. Pulmonic valve regurgitation is mild. No evidence of pulmonic stenosis. Aorta: The aortic root and ascending aorta are structurally normal, with no evidence of dilitation. Venous: The inferior vena cava is normal in size with greater than 50% respiratory variability, suggesting right atrial pressure of 3 mmHg. IAS/Shunts: The atrial septum is grossly normal.  LEFT VENTRICLE PLAX 2D LVIDd:         3.90 cm   Diastology LVIDs:         2.50 cm   LV e' medial:    5.98 cm/s LV PW:         1.00 cm   LV E/e' medial:  12.3 LV IVS:        1.00 cm   LV e' lateral:   10.40 cm/s LVOT diam:     2.20 cm   LV E/e' lateral: 7.0 LV SV:         98 LV SV Index:   48 LVOT Area:     3.80 cm  RIGHT VENTRICLE RV Basal diam:  2.90 cm RV Mid diam:    2.50 cm RV S prime:     16.50 cm/s LEFT ATRIUM              Index        RIGHT ATRIUM           Index LA diam:        3.00 cm  1.49 cm/m   RA Area:     18.10 cm LA Vol (A2C):   113.0 ml 55.99 ml/m  RA Volume:   44.20 ml  21.90 ml/m LA Vol (A4C):   73.5 ml  36.42 ml/m LA Biplane Vol: 92.8 ml  45.99 ml/m  AORTIC VALVE AV Area (Vmax):    1.35 cm AV Area (Vmean):   1.49 cm AV Area (VTI):     1.50 cm AV Vmax:           347.33 cm/s AV Vmean:  231.000 cm/s AV VTI:            0.651 m AV Peak Grad:      48.3 mmHg AV Mean Grad:      24.7 mmHg LVOT Vmax:         123.00 cm/s LVOT Vmean:        90.500 cm/s LVOT VTI:          0.257 m LVOT/AV VTI ratio: 0.39  AORTA Ao Root diam: 3.40 cm Ao Asc diam:  3.40 cm MITRAL VALVE                TRICUSPID VALVE MV Area (PHT): 5.84 cm     TV Peak grad:   32.7 mmHg MV Area VTI:    2.80 cm     TV Vmax:        2.86 m/s MV Peak grad:  5.4 mmHg     TR Peak grad:   32.3 mmHg MV Mean grad:  2.0 mmHg     TR Vmax:        284.00 cm/s MV Vmax:       1.16 m/s MV Vmean:      66.1 cm/s    SHUNTS MV Decel Time: 130 msec     Systemic VTI:  0.26 m MV E velocity: 73.30 cm/s   Systemic Diam: 2.20 cm MV A velocity: 102.00 cm/s MV E/A ratio:  0.72 Shelda Bruckner MD Electronically signed by Shelda Bruckner MD Signature Date/Time: 12/22/2023/11:11:58 AM    Final    CT Angio Chest PE W and/or Wo Contrast Result Date: 12/21/2023 CLINICAL DATA:  High probability for PE. EXAM: CT ANGIOGRAPHY CHEST WITH CONTRAST TECHNIQUE: Multidetector CT imaging of the chest was performed using the standard protocol during bolus administration of intravenous contrast. Multiplanar CT image reconstructions and MIPs were obtained to evaluate the vascular anatomy. RADIATION DOSE REDUCTION: This exam was performed according to the departmental dose-optimization program which includes automated exposure control, adjustment of the mA and/or kV according to patient size and/or use of iterative reconstruction technique. CONTRAST:  75mL OMNIPAQUE  IOHEXOL  350 MG/ML SOLN COMPARISON:  CT chest 07/24/2006 FINDINGS: Cardiovascular: Satisfactory opacification of the pulmonary arteries to the segmental level. No evidence of pulmonary embolism. Normal heart size. No pericardial effusion. There are atherosclerotic calcifications of the aorta. Aortic valve replacement present. Mediastinum/Nodes: No enlarged mediastinal, hilar, or axillary lymph nodes. Thyroid  gland, trachea, and esophagus demonstrate no significant findings. Lungs/Pleura: There is stable mild elevation of the left hemidiaphragm. There are atelectatic changes in the bilateral lower lobes. There is no pleural effusion or pneumothorax. There are few scattered nodular densities in the upper lobes measuring up to 4 mm. Upper Abdomen: Right renal cyst with peripheral  calcifications present. Simple left renal cyst partially imaged. There are punctate calculi in the left kidney. Musculoskeletal: Sternotomy wires are present. No acute fractures are seen. Review of the MIP images confirms the above findings. IMPRESSION: 1. No evidence for pulmonary embolism. 2. Bibasilar atelectasis. 3. Multiple pulmonary nodules. Most significant: Solid pulmonary nodule within the upper lobe measuring 4 mm. No follow-up needed if patient is low-risk (and has no known or suspected primary neoplasm). Non-contrast chest CT can be considered in 12 months if patient is high-risk. This recommendation follows the consensus statement: Guidelines for Management of Incidental Pulmonary Nodules Detected on CT Images: From the Fleischner Society 2017; Radiology 2017; 284:228-243. 4. Aortic atherosclerosis. Aortic Atherosclerosis (ICD10-I70.0). Electronically Signed   By: Greig Pique M.D.   On:  12/21/2023 19:50   DG Chest 2 View Result Date: 12/21/2023 CLINICAL DATA:  Syncope EXAM: CHEST - 2 VIEW COMPARISON:  CHEST x-ray 07/08/2023 FINDINGS: Sternotomy wires are present. The heart size and mediastinal contours are within normal limits. Both lungs are clear. The visualized skeletal structures are unremarkable.There is stable elevation of the left hemidiaphragm. IMPRESSION: No active cardiopulmonary disease. Electronically Signed   By: Greig Pique M.D.   On: 12/21/2023 18:42     Assessment and Plan:  Syncope Patient has a history of pre-syncope/syncope 2/2 dehydration.  Orthostatic vitals were positive this admission. Wife confirmed patient had not been eating /drinking.  Head imaging unremarkable.  Echo does report RWMA, though also mentioned were on previous echocardiograms in the past. Recent NM test without significant ischemia or infarct. ECG does show new TWI, though more non-specific. Will discuss with MD about echo report, though at this time do not suspect ACS.  Given prodromal  symptoms, hypotension on arrival, and positive orthostatic vitals suspect orthostatic hypotension as the etiology. Patient also noted to be anemic on admission, which could be contributing as well.   CAD s/p CABG Stress imaging was negative for ischemia, though did show evidence of potential microvascular angina. Patient has remained asymptomatic. NG deferred 2/2 syncope/pre-syncope episodes Restart ASA 81 mg when okay with surgery, currently on xarelto  for DVT ppx post operatively Continue coreg  6.25 mg BID  Hypertension and white coat hypertension Orthostatic hypotension  BP 118/86 PTA lisinopril  and spironolactone  on hold, agree given acute presentation Continue coreg  6.25 mg BID   AS s/p AVR in 2016 Echo this admission shows mild to moderate AS, overall dimensions stable. Mildly increased gradient. Will continue to monitor outpatient  Hyperlipidemia Continue lipitor  40 mg  Per primary Post-op Anemia Leukocytosis, improved CKD   Risk Assessment/Risk Scores:        For questions or updates, please contact [facility] Please consult [email or web address] for contact info under      Signed, Leontine LOISE Salen, PA-C  12/23/2023 7:26 AM

## 2023-12-23 NOTE — Progress Notes (Signed)
 Physical Therapy Treatment Patient Details Name: [name] [name] MRN: [id number] DOB: May 08, 1940 Today's Date: 12/23/2023   History of Present Illness [name] [name] is an 83 yo male who presents to the ER on 12/21/2023 with complaints of loss of consciousness.  On his way to the bathroom today, he felt woozy, lightheaded, short of breath, sat on the toilet, and called for his wife.  After she arrived in the bathroom, he lost consciousness.  No seizure like movements.  Recently had a right knee replacement on 12/08/2023 by Dr Melodi.  Was in his usual state of health until today. PMH: s/p R TKA on 12/08/23, orthostatic, CAD, syncope, aortic valve replacement, GERD, L TKA 2023, and CABG.    PT Comments   Pt admitted with above diagnosis.  Pt currently with functional limitations due to the deficits listed below (see PT Problem List). Pt in bed when PT arrived. Pt agreeable to therapy intervention. PT guided pt through LE TE in supine and sitting, spouse present and HO provided. PT facilitated R knee ROM with PROM while in supine with contract relax technique for knee extension and passive knee flexion, pt guarding due to pain and noted to have increased quad, hamstring and ITB extensibility. Pt and family ed continued on importance of achieving R terminal knee extension. PT assessed pt response and Bp with positional changes with positive findings for orthostatic hypotension and pt symptomatic today c/o of B LE weakness when in standing, please see below. Pt is S for supine to sit with bed flat, CGA for sit to stand  from EOB and with SPT to recliner. Due to pt reports of B LE weakness and bp findings PT deemed gait assessment and training unsafe at this time.  Pt left in recliner, all needs in place, nursing and spouse present, nurse made aware of Bp findings. Pt will benefit from acute skilled PT to increase their independence and safety with mobility to allow discharge.   Bp supine 100/88 Bp seated  EOB94/65 Bp immediate standing 85/60 Bp immediate return to EOB 89/65 Bp s/p 2 min seated EOB 95/84   If plan is discharge home, recommend the following: Assistance with cooking/housework;Assist for transportation   Can travel by private vehicle        Equipment Recommendations  None recommended by PT    Recommendations for Other Services       Precautions / Restrictions Precautions Precautions: Fall;Knee Recall of Precautions/Restrictions: Intact Precaution/Restrictions Comments: pt and wife ed on importance of R knee extension while in bed and when seated in recliner with use of towel roll Restrictions Weight Bearing Restrictions Per Provider Order: No RLE Weight Bearing Per Provider Order: Weight bearing as tolerated     Mobility  Bed Mobility Overal bed mobility: Needs Assistance Bed Mobility: Supine to Sit     Supine to sit: Supervision     General bed mobility comments: bed flat and min cues    Transfers Overall transfer level: Needs assistance Equipment used: Rolling walker (2 wheels) Transfers: Sit to/from Stand, Bed to chair/wheelchair/BSC Sit to Stand: Supervision           General transfer comment: min cues for proper UE placement, pt able to push to stand with L UE    Ambulation/Gait               General Gait Details: NT today due to symptomatic orthostatic hypotension   Stairs             Wheelchair  Mobility     Tilt Bed    Modified Rankin (Stroke Patients Only)       Balance Overall balance assessment: Mild deficits observed, not formally tested                                          Communication Communication Communication: No apparent difficulties  Cognition Arousal: Alert Behavior During Therapy: WFL for tasks assessed/performed   PT - Cognitive impairments: No apparent impairments                         Following commands: Intact      Cueing Cueing Techniques: Verbal  cues  Exercises Total Joint Exercises Ankle Circles/Pumps: AROM, Both, 10 reps Quad Sets: AROM, Right, 5 reps Heel Slides: AROM, Right, 5 reps Hip ABduction/ADduction: AROM, Right, 5 reps Straight Leg Raises: AROM, Right, 5 reps Long Arc Quad: AROM, Right, 5 reps Knee Flexion: AROM, Right, 5 reps Goniometric ROM: PROM to R knee to facilitate knee extension and flexion with pt lacking terminal knee extension PROM in supine grossly 5-85 with pt guarding due to pain    General Comments        Pertinent Vitals/Pain Pain Assessment Pain Assessment: 0-10 Pain Score: 4  Pain Location: R knee Pain Descriptors / Indicators: Aching, Constant, Discomfort Pain Intervention(s): Monitored during session, Premedicated before session, Repositioned, Ice applied    Home Living                          Prior Function            PT Goals (current goals can now be found in the care plan section) Acute Rehab PT Goals Patient Stated Goal: to be able to go home PT Goal Formulation: With patient Time For Goal Achievement: 01/05/24 Potential to Achieve Goals: Good Progress towards PT goals: Not progressing toward goals - comment (limtied due to medical/Bp)    Frequency    Min 3X/week      PT Plan      Co-evaluation              AM-PAC PT 6 Clicks Mobility   Outcome Measure  Help needed turning from your back to your side while in a flat bed without using bedrails?: None Help needed moving from lying on your back to sitting on the side of a flat bed without using bedrails?: None Help needed moving to and from a bed to a chair (including a wheelchair)?: A Little Help needed standing up from a chair using your arms (e.g., wheelchair or bedside chair)?: A Little Help needed to walk in hospital room?: A Little Help needed climbing 3-5 steps with a railing? : A Little 6 Click Score: 20    End of Session Equipment Utilized During Treatment: Gait belt Activity  Tolerance: Patient tolerated treatment well Patient left: in chair;with call bell/phone within reach;with family/visitor present Nurse Communication: Mobility status;Other (comment) (Bp findings) PT Visit Diagnosis: Other abnormalities of gait and mobility (R26.89);Muscle weakness (generalized) (M62.81);Difficulty in walking, not elsewhere classified (R26.2);Pain Pain - Right/Left: Right Pain - part of body: Knee;Leg     Time: 8858-8785 PT Time Calculation (min) (ACUTE ONLY): 33 min  Charges:    $Therapeutic Exercise: 8-22 mins $Therapeutic Activity: 8-22 mins PT General Charges $$ ACUTE PT VISIT: 1 Visit  [place], PT Acute Rehab     [place] VEAR Drone 12/23/2023, 12:23 PM

## 2023-12-23 NOTE — TOC Initial Note (Signed)
 Transition of Care [facility]) - Initial/Assessment Note    Patient Details  Name: [name] [name] MRN: [id number] Date of Birth: 1940-06-23  Transition of [facility] CM/SW Contact:    Heather DELENA Saltness, LCSW Phone Number: 12/23/2023, 10:36 AM  Clinical Narrative:                 Pt admitted to the hospital due to syncope episode at home. Pt recently discharged on 11/18 after knee replacement surgery. Per chart review, pt has needed DME in the home. Pt to continue OPPT at [facility]. Pt's spouse to transport pt home upon discharge. Pt continuing medical work up. TOC will continue to follow for additional needs.   Expected Discharge Plan: Home/Self Care Barriers to Discharge: Continued Medical Work up   Patient Goals and CMS Choice Patient states their goals for this hospitalization and ongoing recovery are:: To return home        Expected Discharge Plan and Services   Discharge Planning Services: NA Post Acute Care Choice: NA Living arrangements for the past 2 months: Single Family Home                 DME Arranged: N/A DME Agency: NA       HH Arranged: NA HH Agency: NA        Prior Living Arrangements/Services Living arrangements for the past 2 months: Single Family Home Lives with:: Spouse Patient language and need for interpreter reviewed:: Yes Do you feel safe going back to the place where you live?: Yes      Need for Family Participation in Patient Care: Yes (Comment) Care giver support system in place?: Yes (comment)   Criminal Activity/Legal Involvement Pertinent to Current Situation/Hospitalization: No - Comment as needed  Activities of Daily Living   ADL Screening (condition at time of admission) Independently performs ADLs?: Yes (appropriate for developmental age) Is the patient deaf or have difficulty hearing?: No Does the patient have difficulty seeing, even when wearing glasses/contacts?: No Does the patient have difficulty concentrating,  remembering, or making decisions?: No  Permission Sought/Granted Permission sought to share information with : Family Supports Permission granted to share information with : Yes, Verbal Permission Granted  Share Information with NAME: Emauri Krygier  Permission granted to share info w AGENCY: N/A  Permission granted to share info w Relationship: Spouse  Permission granted to share info w Contact Information: [phone]  Emotional Assessment Appearance:: Appears stated age Attitude/Demeanor/Rapport: Unable to Assess Affect (typically observed): Unable to Assess Orientation: : Oriented to Self, Oriented to Place, Oriented to  Time, Oriented to Situation Alcohol / Substance Use: Not Applicable Psych Involvement: No (comment)  Admission diagnosis:  Syncope and collapse [R55] Syncope [R55] Patient Active Problem List   Diagnosis Date Noted   Primary osteoarthritis of right knee 12/08/2023   Orthostatic hypotension 11/01/2021   Bilateral carotid artery disease 10/15/2021   Syncope and collapse 09/18/2021   OA (osteoarthritis) of knee 03/12/2021   Primary osteoarthritis of left knee 03/12/2021   Fatigue 04/05/2020   Pure hypercholesterolemia 03/23/2019   S/P aortic valve replacement with bioprosthetic valve 03/23/2019   Acute cholecystitis 10/28/2016   Varicose veins of left lower extremity with complications 05/23/2015   Bradycardia 09/29/2014   Hx of CABG 09/28/2014   Cardiac murmur, previously undiagnosed 09/23/2014   Coronary artery disease involving native heart with unstable angina pectoris (HCC) 09/23/2014   Syncope 09/30/2013   White coat syndrome with diagnosis of hypertension 09/30/2013   GERD (gastroesophageal  reflux disease) 09/30/2013   PCP:  Loreli Kins, MD Pharmacy:   [facility] [address] [place] - [address] [place] [address] Phone: [phone] Fax: [phone]  [facility] - [address] [place] -  [address] [place] [address] Phone: [phone] Fax: [phone]  [facility] [address] [place] [address] Phone: [phone] Fax: [phone]     Social Drivers of Health (SDOH) Social History: SDOH Screenings   Food Insecurity: No Food Insecurity (12/21/2023)  Housing: Low Risk  (12/21/2023)  Transportation Needs: No Transportation Needs (12/21/2023)  Utilities: Not At Risk (12/21/2023)  Social Connections: Socially Integrated (12/21/2023)  Tobacco Use: Medium Risk (12/21/2023)   SDOH Interventions: None     Readmission Risk Interventions     No data to display          Signed: Heather Saltness, MSW, LCSW Clinical Social Worker Inpatient Care Management 12/23/2023 10:40 AM

## 2023-12-23 NOTE — Progress Notes (Signed)
 Occupational Therapy Evaluation Patient Details Name: [name] [name] MRN: [id number] DOB: [birth year] Today's Date: 12/23/2023   History of Present Illness   Sandford Diop is an 83 yo male who presents to the ER on 12/21/2023 with complaints of loss of consciousness.  On his way to the bathroom today, he felt woozy, lightheaded, short of breath, sat on the toilet, and called for his wife.  After she arrived in the bathroom, he lost consciousness.  No seizure like movements.  Recently had a right knee replacement on 12/08/2023 by Dr Melodi.  Was in his usual state of health until today. PMH: s/p R TKA on 12/08/23, orthostatic, CAD, syncope, aortic valve replacement, GERD, L TKA 2023, and CABG.     Clinical Impressions PTA, patient lives  at home with wife and was indep prior to recent TKA 12/08/23 and has uses a RW and has had some assist for LB ADL's and all IADL's and transportation to OOPT prior to this hospitalization.  Currently, patient presents with deficits outlined below (see OT Problem List for details) most significantly decreased activity tolerance, balance and pain limiting BADL's and functional mobility with poor VSR to mobility outlined below. Patient requires continued Acute care hospital level OT services to progress safety and functional performance and allow for discharge with family assist and support upon discharge from acute setting. Family and patient goal is to resume OPPT for his TKA rehab.   VSR: seated vitals: 110/70, HR 75 SpO2 on RA 98%; 3 minutes standing at RW chair side BP 85/60, HR 78, SpO2 96%, asymptomatic when questioned, returned seated and nursing arrived with thigh high teds ordered and OT applied       If plan is discharge home, recommend the following:   A lot of help with walking and/or transfers;A lot of help with bathing/dressing/bathroom;Assistance with cooking/housework;Assist for transportation;Help with stairs or ramp for entrance      Functional Status Assessment   Patient has had a recent decline in their functional status and demonstrates the ability to make significant improvements in function in a reasonable and predictable amount of time.     Equipment Recommendations   None recommended by OT      Precautions/Restrictions   Precautions Precautions: Fall;Knee Recall of Precautions/Restrictions: Intact Precaution/Restrictions Comments: watch orthostatics, now with thigh high teds and abdominal binder ordered; pt and wife ed on importance of R knee extension while in bed and when seated in recliner with use of towel roll Restrictions Weight Bearing Restrictions Per Provider Order: No RLE Weight Bearing Per Provider Order: Weight bearing as tolerated     Mobility Bed Mobility Overal bed mobility:  (was up in recliner and remained post OT session)                  Transfers Overall transfer level: Needs assistance Equipment used: Rolling walker (2 wheels) Transfers: Sit to/from Stand, Bed to chair/wheelchair/BSC Sit to Stand: Supervision           General transfer comment: took orthostatics and BP dropped during standing therefore maintained in recliner to apply thigh high TEDs that were just ordered      Balance Overall balance assessment: Mild deficits observed, not formally tested                                         ADL either performed or assessed with clinical judgement  ADL Overall ADL's : Needs assistance/impaired Eating/Feeding: Modified independent;Sitting   Grooming: Wash/dry hands;Wash/dry face;Sitting;Modified independent   Upper Body Bathing: Set up;Sitting   Lower Body Bathing: Minimal assistance;Sit to/from stand   Upper Body Dressing : Set up;Sitting   Lower Body Dressing: Moderate assistance;Sit to/from stand Lower Body Dressing Details (indicate cue type and reason): due to new thigh high teds ordered and decreased functional  reach Toilet Transfer: Supervision/safety Toilet Transfer Details (indicate cue type and reason): rec use of BSC in hospital until BP issues stabilize Toileting- Clothing Manipulation and Hygiene: Set up;Sitting/lateral lean       Functional mobility during ADLs: Contact guard assist;Rolling walker (2 wheels) General ADL Comments: issued and trained in 5 P's for ECT with wife and patient, reinforced use of shower seat at home and AE for avoiding bending for prevention of drop in BP as well as pacing, planning activity based on hydration/nutrition and pain meds     Vision Baseline Vision/History: 0 No visual deficits;1 Wears glasses              Pertinent Vitals/Pain Pain Assessment Pain Assessment: 0-10 Pain Score: 2  Pain Location: R knee Pain Descriptors / Indicators: Aching, Constant, Discomfort Pain Intervention(s): Monitored during session, Premedicated before session, Repositioned, Ice applied     Extremity/Trunk Assessment Upper Extremity Assessment Upper Extremity Assessment: Overall WFL for tasks assessed;Right hand dominant   Lower Extremity Assessment Lower Extremity Assessment: Defer to PT evaluation   Cervical / Trunk Assessment Cervical / Trunk Assessment: Normal   Communication Communication Communication: No apparent difficulties   Cognition Arousal: Alert Behavior During Therapy: WFL for tasks assessed/performed Cognition: No apparent impairments                               Following commands: Intact       Cueing  General Comments   Cueing Techniques: Verbal cues  seated vitals: 110/70, HR 75 SpO2 on RA 98%; 3 minutes standing at RW chair side BP 85/60, HR 78, SpO2 96%, asymptomatic when questioned, returned seated and nursing arrived with thigh high teds ordered and OT applied   Exercises Exercises: Other exercises (breathing exercises posted and demonstrated and foam squeeze ball issued)        Home Living Family/patient  expects to be discharged to:: Private residence Living Arrangements: Spouse/significant other Available Help at Discharge: Available 24 hours/day;Family Type of Home: House Home Access: Stairs to enter [employer] of Steps: 2 Entrance Stairs-Rails: Right Home Layout: One level     Bathroom Shower/Tub: Producer, Television/film/video: Handicapped height     Home Equipment: Agricultural Consultant (2 wheels);Cane - single point;Shower seat;Hand held shower head;Adaptive equipment Adaptive Equipment: Reacher Additional Comments: pt participating with OPPT services s/p R TKA      Prior Functioning/Environment Prior Level of Function : Independent/Modified Independent             Mobility Comments: mod I with use of RW for all functional mobility tasks, BADLs ADLs Comments: requires A for IADLs and driving at this time    OT Problem List: Decreased activity tolerance;Impaired balance (sitting and/or standing);Cardiopulmonary status limiting activity;Pain   OT Treatment/Interventions: Self-care/ADL training;Therapeutic exercise;Neuromuscular education;Energy conservation;DME and/or AE instruction;Therapeutic activities;Balance training;Patient/family education      OT Goals(Current goals can be found in the care plan section)   Acute Rehab OT Goals Patient Stated Goal: to be back on my feet OT Goal Formulation:  With patient/family Time For Goal Achievement: 01/06/24 Potential to Achieve Goals: Good ADL Goals Pt Will Perform Lower Body Bathing: with contact guard assist;with adaptive equipment;sit to/from stand Pt Will Perform Lower Body Dressing: with min assist;with adaptive equipment;sit to/from stand Pt Will Transfer to Toilet: with contact guard assist;ambulating;regular height toilet Pt Will Perform Toileting - Clothing Manipulation and hygiene: sit to/from stand;with contact guard assist Pt Will Perform Tub/Shower Transfer: with contact guard  assist;ambulating;Shower transfer;shower seat Additional ADL Goal #1: Patient will teach back 4/5 ECTs and demonstrate breathing strategies with min cues for BADL's and mobility   OT Frequency:  Min 2X/week       AM-PAC OT 6 Clicks Daily Activity     Outcome Measure Help from another person eating meals?: None Help from another person taking care of personal grooming?: A Little Help from another person toileting, which includes using toliet, bedpan, or urinal?: A Little Help from another person bathing (including washing, rinsing, drying)?: A Little Help from another person to put on and taking off regular upper body clothing?: A Little Help from another person to put on and taking off regular lower body clothing?: A Lot 6 Click Score: 18   End of Session Equipment Utilized During Treatment: Gait belt;Rolling walker (2 wheels) CPM Right Knee CPM Right Knee: Off Right Knee Flexion (Degrees): 40 Right Knee Extension (Degrees): 10 Nurse Communication: Mobility status;Other (comment) (orthostatic response and nursing issued thigh highs to don)  Activity Tolerance: Other (comment) (orthostatic) Patient left: in chair;with call bell/phone within reach;with chair alarm set;with family/visitor present;with nursing/sitter in room  OT Visit Diagnosis: Unsteadiness on feet (R26.81);Pain Pain - part of body: Knee                Time: 1335-1405 OT Time Calculation (min): 30 min Charges:  OT General Charges $OT Visit: 1 Visit OT Evaluation $OT Eval Low Complexity: 1 Low OT Treatments $Therapeutic Activity: 8-22 mins Briyan Kleven OT/L Acute Rehabilitation Department  [phone]  12/23/2023, 3:22 PM

## 2023-12-23 NOTE — Progress Notes (Signed)
 [facility]                                                                               Daimen Shovlin, is a 83 y.o. male, DOB - December 31, 1940, FMW:[id number] Admit date - 12/21/2023    Outpatient Primary MD for the patient is Loreli Kins, MD  LOS - 0  days    Brief summary   [name] [name] is a 83 y.o. male with medical history significant for coronary artery disease status post CABG, aortic stenosis status post AVR, hypertension, hyperlipidemia, history of prior syncope, history of orthostatic hypotension, who presents to the ER with complaints of loss of consciousness.  On his way to the bathroom today, he felt woozy, lightheaded, short of breath, sat on the toilet, and called for his wife.  After she arrived in the bathroom, he lost consciousness.  No seizure like movements.  Of note, patient had recently undergone a right knee replacement on 12/08/2023 by Dr Melodi. He was admitted for evaluation of syncope. He was found to have orthostatic hypotension.  CT angiogram of the chest was negative for pulmonary embolism. CT head without contrast ordered yesterday and was negative for acute intracranial abnormalities.  Family requested cardiology to be consulted.  Patient seen and examined at bedside. He currently denies any complaints. He was not symptomatic with positive orthostatics when working with PT today.     Assessment & Plan    Assessment and Plan:   Syncope  Differential include vasovagal versus orthostatic hypotension from  dehydration and anemia ( hemoglobin drop from 12 to 9 today) versus probably from pain medication ( pt reports taking oxycodone  a few hours prior to the episode) Patient's orthostatic blood pressure parameters earlier this morning were orthostatic, . Fluid bolus ordered and BP meds ( spironolactone  and LIsinopril ) held. No other source of infection found. CT angio of the chest ruled out PE CT of the head is negative for acute  intracranial abnormalities.  Echocardiogram showed preserved left ventricular ejection fraction, unchanged from the previous echo. Patient currently denies any chest pain or shortness of breath. Patient's creatinine was elevated at 1.2 on admission and improved to 1.1 with IV fluids    Right knee swelling s/p TKA  Ortho consulted.  Pain control. Therapy evaluations ordered.    Mild leukocytosis Resolved.   No signs of infection found.   History of coronary artery disease status post CABG Mildly elevated troponins probably from demand ischemia from dehydration, anemia. EKG shows sinus rhythm with PVC'S  Essential hypertension Bp parameters are optimal.  Holding lisinopril  and spironolactone .  Started Coreg  6.25, recommend decreasing the dose of coreg , but will defer to cardiology.     Aortic stenosis s/p AVR   Anemia of blood loss probably  from the surgery.  Baseline hemoglobin around 12, dropped to 10.2 to 9.4  to 9.9 today.  Some component of hemodilution?  No obvious signs of bleeding.  Monitor.   Estimated body mass index is 22.34 kg/m as calculated from the following:   Height as of this encounter: 6' 1 (1.854 m).   Weight as of this encounter: 76.8 kg.  Code Status: FULL CODE.  DVT Prophylaxis:  Place TED hose Start: 12/22/23 1219 rivaroxaban  (XARELTO ) tablet 10 mg Start: 12/22/23 0800 rivaroxaban  (XARELTO ) tablet 10 mg   Level of Care: Level of care: Telemetry Family Communication: Updated patient's FAMILY at bedside.   Disposition Plan:     Remains inpatient appropriate:  pending.   Procedures:  echo  Consultants:   Cardiology   Antimicrobials:   Anti-infectives (From admission, onward)    None        Medications  Scheduled Meds:  atorvastatin   40 mg Oral Daily   carvedilol   6.25 mg Oral BID   oxybutynin   5 mg Oral BID   pantoprazole   40 mg Oral Daily   prednisoLONE  acetate  1 drop Left Eye Daily   rivaroxaban   10 mg Oral Q breakfast    timolol   1 drop Left Eye BID   Continuous Infusions:   PRN Meds:.acetaminophen , melatonin, methocarbamol , polyethylene glycol, prochlorperazine, traMADol     Subjective:   [name] [name] was seen and examined today.  No new complaints.   Objective:   Vitals:   12/22/23 2009 12/22/23 2050 12/23/23 0500 12/23/23 0505  BP: 132/79   118/86  Pulse: 67   74  Resp: 18   18  Temp: 98.3 F (36.8 C)   98.2 F (36.8 C)  TempSrc: Oral   Oral  SpO2: 96%   97%  Weight:  76.6 kg 76.8 kg   Height:        Intake/Output Summary (Last 24 hours) at 12/23/2023 1507 Last data filed at 12/23/2023 0920 Gross per 24 hour  Intake 1429.99 ml  Output 1000 ml  Net 429.99 ml   Filed Weights   12/22/23 2050 12/23/23 0500  Weight: 76.6 kg 76.8 kg     Exam General exam: Elderly gentleman, appears deconditioned, not in any distress.  Respiratory system: Clear to auscultation. Respiratory effort normal. Cardiovascular system: S1 & S2 heard, RRR.  Gastrointestinal system: Abdomen is soft bs+ Central nervous system: Alert and oriented.  Extremities: right knee  mild swelling but no tenderness Skin: No rashes Psychiatry: Mood & affect appropriate.    Data Reviewed:  I have personally reviewed following labs and imaging studies   CBC Lab Results  Component Value Date   WBC 9.0 12/23/2023   RBC 3.06 (L) 12/23/2023   HGB 9.9 (L) 12/23/2023   HCT 29.5 (L) 12/23/2023   MCV 96.4 12/23/2023   MCH 32.4 12/23/2023   PLT 420 (H) 12/23/2023   MCHC 33.6 12/23/2023   RDW 12.1 12/23/2023   LYMPHSABS 1.0 12/23/2023   MONOABS 0.9 12/23/2023   EOSABS 0.3 12/23/2023   BASOSABS 0.0 12/23/2023     Last metabolic panel Lab Results  Component Value Date   NA 137 12/23/2023   K 4.4 12/23/2023   CL 103 12/23/2023   CO2 25 12/23/2023   BUN 19 12/23/2023   CREATININE 1.10 12/23/2023   GLUCOSE 99 12/23/2023   GFRNONAA >60 12/23/2023   GFRAA 66 10/05/2019   CALCIUM  9.6 12/23/2023   PHOS 3.1  12/22/2023   PROT 6.2 (L) 12/22/2023   ALBUMIN  3.3 (L) 12/22/2023   BILITOT 1.2 12/22/2023   ALKPHOS 78 12/22/2023   AST 31 12/22/2023   ALT 36 12/22/2023   ANIONGAP 8 12/23/2023    CBG (last 3)  No results for input(s): GLUCAP in the last 72 hours.    Coagulation Profile: No results for input(s): INR, PROTIME in the last 168 hours.   Radiology Studies: CT HEAD WO CONTRAST ( )  Result Date: 12/22/2023 EXAM: CT HEAD WITHOUT 12/22/2023 02:45:16 PM TECHNIQUE: CT of the head was performed without the administration of intravenous contrast. Automated exposure control, iterative reconstruction, and/or weight based adjustment of the mA/kV was utilized to reduce the radiation dose to as low as reasonably achievable. COMPARISON: None available. CLINICAL HISTORY: syncope FINDINGS: BRAIN AND VENTRICLES: No acute intracranial hemorrhage. No mass effect or midline shift. No extra-axial fluid collection. No evidence of acute infarct. No hydrocephalus. Age related atrophy. Mild periventricular chronic small vessel ischemia. ORBITS: Post bilateral cataract resection. Left glaucoma drainage device. SINUSES AND MASTOIDS: No acute abnormality. SOFT TISSUES AND SKULL: No acute skull fracture. No acute soft tissue abnormality. Atherosclerosis of skullbase vasculature. IMPRESSION: 1. No acute intracranial abnormality 2. Age related atrophy and mild periventricular chronic small vessel ischemia. Electronically signed by: Franky Stanford MD 12/22/2023 11:53 PM EST RP Workstation: [id number]   ECHOCARDIOGRAM COMPLETE Result Date: 12/22/2023    ECHOCARDIOGRAM REPORT   Patient Name:   [name] [name] Date of Exam: 12/22/2023 Medical Rec #:  [id number]       Height:       73.0 in Accession #:    [id number]      Weight:       172.0 lb Date of Birth:  10-26-1940       BSA:          2.018 m Patient Age:    83 years        BP:           127/85 mmHg Patient Gender: M               HR:           59 bpm. Exam Location:   Inpatient Procedure: 2D Echo (Both Spectral and Color Flow Doppler were utilized during            procedure). Indications:    Syncope  History:        Patient has prior history of Echocardiogram examinations. CAD.                 Aortic Valve: 25 mm Magna bioprosthetic valve is present in the                 aortic position. Procedure Date: 09/2014.  Sonographer:    Charmaine Gaskins Referring Phys: [id number] CAROLE N HALL IMPRESSIONS  1. Left ventricular ejection fraction, by estimation, is 60 to 65%. The left ventricle has normal function. The left ventricle demonstrates regional wall motion abnormalities (see scoring diagram/findings for description). Left ventricular diastolic parameters are consistent with Grade I diastolic dysfunction (impaired relaxation). There is dyskinesis of the left ventricular, basal inferolateral wall.  2. Right ventricular systolic function is normal. The right ventricular size is normal. There is normal pulmonary artery systolic pressure.  3. Left atrial size was moderately dilated.  4. The mitral valve is normal in structure. Trivial mitral valve regurgitation. No evidence of mitral stenosis.  5. The aortic valve has been repaired/replaced. There is mild calcification of the aortic valve. Aortic valve regurgitation is not visualized. Mild to moderate aortic valve stenosis. There is a 25 mm Magna bioprosthetic valve present in the aortic position. Procedure Date: 09/2014. Aortic valve area, by VTI measures 1.50 cm. Aortic valve mean gradient measures 24.7 mmHg. Aortic valve Vmax measures 3.47 m/s.  6. The inferior vena cava is normal in size with greater than 50% respiratory variability, suggesting right atrial pressure of 3 mmHg. Comparison(s): Prior images  reviewed side by side. Conclusion(s)/Recommendation(s): AVR mean gradient slightly increased from earlier this year, but AVA and DI similar to improved. Normal EF with previously noted focal wall abnormalities. FINDINGS  Left  Ventricle: Left ventricular ejection fraction, by estimation, is 60 to 65%. The left ventricle has normal function. The left ventricle demonstrates regional wall motion abnormalities. The left ventricular internal cavity size was normal in size. There is no left ventricular hypertrophy. Left ventricular diastolic parameters are consistent with Grade I diastolic dysfunction (impaired relaxation). Right Ventricle: The right ventricular size is normal. No increase in right ventricular wall thickness. Right ventricular systolic function is normal. There is normal pulmonary artery systolic pressure. The tricuspid regurgitant velocity is 2.84 m/s, and  with an assumed right atrial pressure of 3 mmHg, the estimated right ventricular systolic pressure is 35.3 mmHg. Left Atrium: Left atrial size was moderately dilated. Right Atrium: Right atrial size was normal in size. Pericardium: There is no evidence of pericardial effusion. Mitral Valve: The mitral valve is normal in structure. Trivial mitral valve regurgitation. No evidence of mitral valve stenosis. MV peak gradient, 5.4 mmHg. The mean mitral valve gradient is 2.0 mmHg. Tricuspid Valve: The tricuspid valve is normal in structure. Tricuspid valve regurgitation is mild . No evidence of tricuspid stenosis. Aortic Valve: DI 0.39. The aortic valve has been repaired/replaced. There is mild calcification of the aortic valve. Aortic valve regurgitation is not visualized. Mild to moderate aortic stenosis is present. Aortic valve mean gradient measures 24.7 mmHg.  Aortic valve peak gradient measures 48.3 mmHg. Aortic valve area, by VTI measures 1.50 cm. There is a 25 mm Magna bioprosthetic valve present in the aortic position. Procedure Date: 09/2014. Pulmonic Valve: The pulmonic valve was not well visualized. Pulmonic valve regurgitation is mild. No evidence of pulmonic stenosis. Aorta: The aortic root and ascending aorta are structurally normal, with no evidence of dilitation.  Venous: The inferior vena cava is normal in size with greater than 50% respiratory variability, suggesting right atrial pressure of 3 mmHg. IAS/Shunts: The atrial septum is grossly normal.  LEFT VENTRICLE PLAX 2D LVIDd:         3.90 cm   Diastology LVIDs:         2.50 cm   LV e' medial:    5.98 cm/s LV PW:         1.00 cm   LV E/e' medial:  12.3 LV IVS:        1.00 cm   LV e' lateral:   10.40 cm/s LVOT diam:     2.20 cm   LV E/e' lateral: 7.0 LV SV:         98 LV SV Index:   48 LVOT Area:     3.80 cm  RIGHT VENTRICLE RV Basal diam:  2.90 cm RV Mid diam:    2.50 cm RV S prime:     16.50 cm/s LEFT ATRIUM              Index        RIGHT ATRIUM           Index LA diam:        3.00 cm  1.49 cm/m   RA Area:     18.10 cm LA Vol (A2C):   113.0 ml 55.99 ml/m  RA Volume:   44.20 ml  21.90 ml/m LA Vol (A4C):   73.5 ml  36.42 ml/m LA Biplane Vol: 92.8 ml  45.99 ml/m  AORTIC VALVE AV Area (Vmax):  1.35 cm AV Area (Vmean):   1.49 cm AV Area (VTI):     1.50 cm AV Vmax:           347.33 cm/s AV Vmean:          231.000 cm/s AV VTI:            0.651 m AV Peak Grad:      48.3 mmHg AV Mean Grad:      24.7 mmHg LVOT Vmax:         123.00 cm/s LVOT Vmean:        90.500 cm/s LVOT VTI:          0.257 m LVOT/AV VTI ratio: 0.39  AORTA Ao Root diam: 3.40 cm Ao Asc diam:  3.40 cm MITRAL VALVE                TRICUSPID VALVE MV Area (PHT): 5.84 cm     TV Peak grad:   32.7 mmHg MV Area VTI:   2.80 cm     TV Vmax:        2.86 m/s MV Peak grad:  5.4 mmHg     TR Peak grad:   32.3 mmHg MV Mean grad:  2.0 mmHg     TR Vmax:        284.00 cm/s MV Vmax:       1.16 m/s MV Vmean:      66.1 cm/s    SHUNTS MV Decel Time: 130 msec     Systemic VTI:  0.26 m MV E velocity: 73.30 cm/s   Systemic Diam: 2.20 cm MV A velocity: 102.00 cm/s MV E/A ratio:  0.72 Shelda Bruckner MD Electronically signed by Shelda Bruckner MD Signature Date/Time: 12/22/2023/11:11:58 AM    Final    CT Angio Chest PE W and/or Wo Contrast Result Date:  12/21/2023 CLINICAL DATA:  High probability for PE. EXAM: CT ANGIOGRAPHY CHEST WITH CONTRAST TECHNIQUE: Multidetector CT imaging of the chest was performed using the standard protocol during bolus administration of intravenous contrast. Multiplanar CT image reconstructions and MIPs were obtained to evaluate the vascular anatomy. RADIATION DOSE REDUCTION: This exam was performed according to the departmental dose-optimization program which includes automated exposure control, adjustment of the mA and/or kV according to patient size and/or use of iterative reconstruction technique. CONTRAST:  75mL OMNIPAQUE  IOHEXOL  350 MG/ML SOLN COMPARISON:  CT chest 07/24/2006 FINDINGS: Cardiovascular: Satisfactory opacification of the pulmonary arteries to the segmental level. No evidence of pulmonary embolism. Normal heart size. No pericardial effusion. There are atherosclerotic calcifications of the aorta. Aortic valve replacement present. Mediastinum/Nodes: No enlarged mediastinal, hilar, or axillary lymph nodes. Thyroid  gland, trachea, and esophagus demonstrate no significant findings. Lungs/Pleura: There is stable mild elevation of the left hemidiaphragm. There are atelectatic changes in the bilateral lower lobes. There is no pleural effusion or pneumothorax. There are few scattered nodular densities in the upper lobes measuring up to 4 mm. Upper Abdomen: Right renal cyst with peripheral calcifications present. Simple left renal cyst partially imaged. There are punctate calculi in the left kidney. Musculoskeletal: Sternotomy wires are present. No acute fractures are seen. Review of the MIP images confirms the above findings. IMPRESSION: 1. No evidence for pulmonary embolism. 2. Bibasilar atelectasis. 3. Multiple pulmonary nodules. Most significant: Solid pulmonary nodule within the upper lobe measuring 4 mm. No follow-up needed if patient is low-risk (and has no known or suspected primary neoplasm). Non-contrast chest CT can  be considered in 12 months if patient is high-risk.  This recommendation follows the consensus statement: Guidelines for Management of Incidental Pulmonary Nodules Detected on CT Images: From the Fleischner Society 2017; Radiology 2017; 284:228-243. 4. Aortic atherosclerosis. Aortic Atherosclerosis (ICD10-I70.0). Electronically Signed   By: Greig Pique M.D.   On: 12/21/2023 19:50   DG Chest 2 View Result Date: 12/21/2023 CLINICAL DATA:  Syncope EXAM: CHEST - 2 VIEW COMPARISON:  CHEST x-ray 07/08/2023 FINDINGS: Sternotomy wires are present. The heart size and mediastinal contours are within normal limits. Both lungs are clear. The visualized skeletal structures are unremarkable.There is stable elevation of the left hemidiaphragm. IMPRESSION: No active cardiopulmonary disease. Electronically Signed   By: Greig Pique M.D.   On: 12/21/2023 18:42       Elgie Butter M.D. [facility] 12/23/2023, 3:07 PM  Available via Epic secure chat 7am-7pm After 7 pm, please refer to night coverage provider listed on amion.

## 2023-12-24 DIAGNOSIS — I951 Orthostatic hypotension: Secondary | ICD-10-CM | POA: Diagnosis not present

## 2023-12-24 NOTE — Progress Notes (Signed)
 DAILY PROGRESS NOTE   Patient Name: [name] [name] Date of Encounter: 12/24/2023 Cardiologist: Annabella Scarce, MD  Chief Complaint   Dizziness improved  Patient Profile   [name] [name] is a 83 y.o. male with a hx of hypertension, CAD s/p CABG (LIMA-->LAD, SVG-->RI/OM1, SVG-->PDA) in 2016, aortic stenosis s/p AVR in 2016, bradycardia, and syncope  who is being seen 12/23/2023 for the evaluation of syncope at the request of Elgie Butter MD.   Subjective   Orthostatics improved today (see OT note). Currently off BP meds. Highest recorded BP 140/94.  Objective   Vitals:   12/23/23 2200 12/24/23 0500 12/24/23 0612 12/24/23 1220  BP: 134/78  135/79 (!) 133/94  Pulse: 67  69 71  Resp: 18  20 16   Temp: 98.7 F (37.1 C)  98.3 F (36.8 C) 98.2 F (36.8 C)  TempSrc: Oral  Oral Oral  SpO2: 97%  96% 100%  Weight:  75.9 kg    Height:        Intake/Output Summary (Last 24 hours) at 12/24/2023 1230 Last data filed at 12/24/2023 1000 Gross per 24 hour  Intake 1257.47 ml  Output 1175 ml  Net 82.47 ml   Filed Weights   12/22/23 2050 12/23/23 0500 12/24/23 0500  Weight: 76.6 kg 76.8 kg 75.9 kg    Physical Exam   General appearance: alert and no distress Lungs: clear to auscultation bilaterally Heart: regular rate and rhythm Extremities: extremities normal, atraumatic, no cyanosis or edema Neurologic: Grossly normal  Inpatient Medications    Scheduled Meds:  atorvastatin   40 mg Oral Daily   oxybutynin   5 mg Oral BID   pantoprazole   40 mg Oral Daily   prednisoLONE  acetate  1 drop Left Eye Daily   rivaroxaban   10 mg Oral Q breakfast   timolol   1 drop Left Eye BID    Continuous Infusions:   PRN Meds: acetaminophen , melatonin, methocarbamol , polyethylene glycol, prochlorperazine , traMADol    Labs   Results for orders placed or performed during the hospital encounter of 12/21/23 (from the past 48 hours)  Basic metabolic panel with GFR     Status: None    Collection Time: 12/23/23  4:03 AM  Result Value Ref Range   Sodium 137 135 - 145 mmol/L   Potassium 4.4 3.5 - 5.1 mmol/L   Chloride 103 98 - 111 mmol/L   CO2 25 22 - 32 mmol/L   Glucose, Bld 99 70 - 99 mg/dL    Comment: Glucose reference range applies only to samples taken after fasting for at least 8 hours.   BUN 19 8 - 23 mg/dL   Creatinine, Ser 8.89 0.61 - 1.24 mg/dL   Calcium  9.6 8.9 - 10.3 mg/dL   GFR, Estimated >39 >39 mL/min    Comment: (NOTE) Calculated using the CKD-EPI Creatinine Equation (2021)    Anion gap 8 5 - 15    Comment: Performed at [facility], [address], [place], [place] [address]  CBC with Differential/Platelet     Status: Abnormal   Collection Time: 12/23/23  4:03 AM  Result Value Ref Range   WBC 9.0 4.0 - 10.5 K/uL   RBC 3.06 (L) 4.22 - 5.81 MIL/uL   Hemoglobin 9.9 (L) 13.0 - 17.0 g/dL   HCT 70.4 (L) 60.9 - 47.9 %   MCV 96.4 80.0 - 100.0 fL   MCH 32.4 26.0 - 34.0 pg   MCHC 33.6 30.0 - 36.0 g/dL   RDW 87.8 88.4 -  15.5 %   Platelets 420 (H) 150 - 400 K/uL   nRBC 0.0 0.0 - 0.2 %   Neutrophils Relative % 75 %   Neutro Abs 6.8 1.7 - 7.7 K/uL   Lymphocytes Relative 11 %   Lymphs Abs 1.0 0.7 - 4.0 K/uL   Monocytes Relative 10 %   Monocytes Absolute 0.9 0.1 - 1.0 K/uL   Eosinophils Relative 3 %   Eosinophils Absolute 0.3 0.0 - 0.5 K/uL   Basophils Relative 0 %   Basophils Absolute 0.0 0.0 - 0.1 K/uL   Immature Granulocytes 1 %   Abs Immature Granulocytes 0.06 0.00 - 0.07 K/uL    Comment: Performed at [facility], [address], [place], [place] [address]    ECG   N/A  Telemetry   Sinus rhythm with 1st degree AVB, occasional PVC's - Personally Reviewed  Radiology    CT HEAD WO CONTRAST ( ) Result Date: 12/22/2023 EXAM: CT HEAD WITHOUT 12/22/2023 02:45:16 PM TECHNIQUE: CT of the head was performed without the administration of intravenous contrast. Automated exposure control, iterative  reconstruction, and/or weight based adjustment of the mA/kV was utilized to reduce the radiation dose to as low as reasonably achievable. COMPARISON: None available. CLINICAL HISTORY: syncope FINDINGS: BRAIN AND VENTRICLES: No acute intracranial hemorrhage. No mass effect or midline shift. No extra-axial fluid collection. No evidence of acute infarct. No hydrocephalus. Age related atrophy. Mild periventricular chronic small vessel ischemia. ORBITS: Post bilateral cataract resection. Left glaucoma drainage device. SINUSES AND MASTOIDS: No acute abnormality. SOFT TISSUES AND SKULL: No acute skull fracture. No acute soft tissue abnormality. Atherosclerosis of skullbase vasculature. IMPRESSION: 1. No acute intracranial abnormality 2. Age related atrophy and mild periventricular chronic small vessel ischemia. Electronically signed by: Franky Stanford MD 12/22/2023 11:53 PM EST RP Workstation: [id number]    Cardiac Studies   N/A  Assessment   Principal Problem:   Syncope Active Problems:   Orthostatic syncope   Plan   Mr. Nettle has had recurrent orthostatic syncope. BP meds now totally held, with max BP 140/94 today -> dropped to 121/92, but no longer orthostatic positive. He did much better ambulating. Would keep off BP meds going home. If BP starts to climb over 160 systolic, could consider restarting low dose coreg  3.125 mg BID.  Ok to d/c home today from a cardiac standpoint. Follow-up with Dr. Raford or APP.  [facility] will sign off.   Medication Recommendations:  as above Other recommendations (labs, testing, etc):  none Follow up as an outpatient:  Dr. Raford or APP   Time Spent Directly with Patient:  I have spent a total of 25 minutes with the patient reviewing hospital notes, telemetry, EKGs, labs and examining the patient as well as establishing an assessment and plan that was discussed personally with the patient.  > 50% of time was spent in direct patient  care.  Length of Stay:  LOS: 1 day   Vinie KYM Maxcy, MD, [facility], FNLA, FACP  [facility]  [facility]  Medical Director of the [facility]  [facility] Diplomate of the American Board of Clinical Lipidology Attending Cardiologist  Direct Dial: [phone]  Fax: [phone]  Website:  [email or web address] Vinie BROCKS Fidencio Duddy 12/24/2023, 12:30 PM

## 2023-12-24 NOTE — Progress Notes (Signed)
 Occupational Therapy Treatment Patient Details Name: [name] [name] MRN: [id number] DOB: 18-Sep-1940 Today's Date: 12/24/2023   History of present illness [name] [name] is an 83 yo male who presents to the ER on 12/21/2023 with complaints of loss of consciousness.  On his way to the bathroom today, he felt woozy, lightheaded, short of breath, sat on the toilet, and called for his wife.  After she arrived in the bathroom, he lost consciousness.  No seizure like movements.  Recently had a right knee replacement on 12/08/2023 by Dr Melodi.  Was in his usual state of health until today. PMH: s/p R TKA on 12/08/23, orthostatic, CAD, syncope, aortic valve replacement, GERD, L TKA 2023, and CABG.   OT comments  Pt presented in bed with Ted hose already donned and reported his wife can assist with at home if needed. At this time completed bed mobility with mod I, sit to stand transfers with supervision-CGA, and ambulation with RW with supervision to CGA. He required to complete toileting tasks in session with supervision and then want to complete ADLS while standing at the sink with no BUE support with supervision. At this time recommendation to  follow back up with OP PT and Acute Occupational Therapy to follow.   Vitals: Supine: 140/80 (98) hr 79 Sitting: 131/88 (97) hr 83 Standing: 121/92 (101) hr 108 Standing 3 mins: 139/90 (106) hr 88 Post toileting: 140/94 (107) hr 88 Post ambulation: 128/89 (101) hr 89      If plan is discharge home, recommend the following:  A little help with bathing/dressing/bathroom;Assistance with cooking/housework;Assist for transportation   Equipment Recommendations  None recommended by OT    Recommendations for Other Services      Precautions / Restrictions Precautions Precautions: Fall;Knee Recall of Precautions/Restrictions: Intact Precaution/Restrictions Comments: watch orthostatics, now with thigh high teds (on at the start of session), abdominal binder in  room but did not use Restrictions Weight Bearing Restrictions Per Provider Order: No RLE Weight Bearing Per Provider Order: Weight bearing as tolerated       Mobility Bed Mobility Overal bed mobility: Modified Independent Bed Mobility: Supine to Sit     Supine to sit: Supervision          Transfers Overall transfer level: Needs assistance Equipment used: Rolling walker (2 wheels) Transfers: Sit to/from Stand Sit to Stand: Supervision, Contact guard assist                 Balance Overall balance assessment: Needs assistance Sitting-balance support: Feet supported Sitting balance-Leahy Scale: Good     Standing balance support: Bilateral upper extremity supported, Single extremity supported, No upper extremity supported Standing balance-Leahy Scale: Fair Standing balance comment: does best ambulating with RW                           ADL either performed or assessed with clinical judgement   ADL Overall ADL's : Needs assistance/impaired Eating/Feeding: Modified independent;Sitting   Grooming: Wash/dry face;Wash/dry hands;Supervision/safety;Standing   Upper Body Bathing: Set up;Sitting   Lower Body Bathing: Contact guard assist;Minimal assistance;Sit to/from stand   Upper Body Dressing : Set up;Sitting   Lower Body Dressing: Contact guard assist;Minimal assistance;Sit to/from stand Lower Body Dressing Details (indicate cue type and reason): However, going toneed further assist with Ted hose and educated pt on wife can assist Toilet Transfer: Supervision/safety;Contact guard assist;Cueing for safety;Cueing for sequencing;Rolling walker (2 wheels)   Toileting- Clothing Manipulation and Hygiene: Supervision/safety;Contact guard assist;Sit  to/from stand       Functional mobility during ADLs: Supervision/safety;Contact guard assist;Rolling walker (2 wheels)      Extremity/Trunk Assessment Upper Extremity Assessment Upper Extremity Assessment:  Overall WFL for tasks assessed   Lower Extremity Assessment Lower Extremity Assessment: Defer to PT evaluation        Vision       Perception     Praxis     Communication Communication Communication: No apparent difficulties   Cognition Arousal: Alert Behavior During Therapy: WFL for tasks assessed/performed Cognition: No apparent impairments                               Following commands: Intact        Cueing   Cueing Techniques: Verbal cues  Exercises      Shoulder Instructions       General Comments      Pertinent Vitals/ Pain       Pain Assessment Pain Assessment: 0-10 Pain Score: 2  Pain Location: R knee Pain Descriptors / Indicators: Aching, Constant, Discomfort  Home Living                                          Prior Functioning/Environment              Frequency  Min 2X/week        Progress Toward Goals  OT Goals(current goals can now be found in the care plan section)  Progress towards OT goals: Progressing toward goals  Acute Rehab OT Goals Patient Stated Goal: to go home OT Goal Formulation: With patient Time For Goal Achievement: 01/06/24 Potential to Achieve Goals: Good ADL Goals Pt Will Perform Lower Body Bathing: with contact guard assist;with adaptive equipment;sit to/from stand Pt Will Perform Lower Body Dressing: with min assist;with adaptive equipment;sit to/from stand Pt Will Transfer to Toilet: with contact guard assist;ambulating;regular height toilet Pt Will Perform Toileting - Clothing Manipulation and hygiene: sit to/from stand;with contact guard assist Pt Will Perform Tub/Shower Transfer: with contact guard assist;ambulating;Shower transfer;shower seat Additional ADL Goal #1: Patient will teach back 4/5 ECTs and demonstrate breathing strategies with min cues for BADL's and mobility  Plan      Co-evaluation                 AM-PAC OT 6 Clicks Daily Activity      Outcome Measure   Help from another person eating meals?: None Help from another person taking care of personal grooming?: A Little Help from another person toileting, which includes using toliet, bedpan, or urinal?: A Little Help from another person bathing (including washing, rinsing, drying)?: A Little Help from another person to put on and taking off regular upper body clothing?: A Little Help from another person to put on and taking off regular lower body clothing?: A Little 6 Click Score: 19    End of Session Equipment Utilized During Treatment: Gait belt;Rolling walker (2 wheels)  OT Visit Diagnosis: Unsteadiness on feet (R26.81);Pain Pain - Right/Left: Right Pain - part of body: Knee   Activity Tolerance Patient tolerated treatment well   Patient Left in chair;with call bell/phone within reach   Nurse Communication Mobility status        Time: 0911-0950 OT Time Calculation (min): 39 min  Charges: OT General Charges $OT Visit: 1 Visit OT  Treatments $Self Care/Home Management : 38-52 mins  Warrick POUR OTR/L  Acute Rehab Services  [phone] office number   Warrick Berber 12/24/2023, 10:04 AM

## 2023-12-24 NOTE — Progress Notes (Signed)
  Progress Note   Patient: [name] [name] FMW:[id number] DOB: [birth year] DOA: 12/21/2023     1 DOS: the patient was seen and examined on 12/24/2023   Brief hospital course: 83 year old male PMH including recent elective total knee arthroplasty 11/17, CABG, CAD, aortic stenosis status post AVR, orthostatic hypotension, presented with syncope on the way to the bathroom.  Admitted for further evaluation of syncope.  [facility]   Procedures/Events None   Assessment and Plan: Syncope secondary to orthostatic hypotension Orthostatic hypotension Elevated troponin CT chest no PE, CT head no acute abnormalities, echocardiogram showed preserved LVEF, no changes noted Orthostatics improved today, patient asymptomatic.  Follow-up cardiology recommendations.  Likely home later today.  Status post elective right TKA 11/17 for osteoarthritis ABLA related to recent surgery Seen by orthopedics 12/1 in the hospital, will follow-up next week. Hemoglobin stable at 9.9 after hydration.   PMH CAD Status post CABG Chronic diastolic CHF Aortic stenosis status post AVR Stable.  Management per cardiology  CKD stage IIIa Stable  Hypovolemic hyponatremia Resolved, secondary to poor fluid intake  Multiple pulmonary nodules  Most significant: Solid pulmonary nodule within the upper lobe measuring 4 mm. No follow-up needed if patient is low-risk (and has no known or suspected primary neoplasm). Non-contrast chest CT can be considered in 12 months if patient is high-risk.    Subjective:  Feels fine, no lightheadedness  Physical Exam: Vitals:   12/23/23 0505 12/23/23 2200 12/24/23 0500 12/24/23 0612  BP: 118/86 134/78  135/79  Pulse: 74 67  69  Resp: 18 18  20   Temp: 98.2 F (36.8 C) 98.7 F (37.1 C)  98.3 F (36.8 C)  TempSrc: Oral Oral  Oral  SpO2: 97% 97%  96%  Weight:   75.9 kg   Height:       General: Appears, comfortable. Cardiovascular: Regular rate and  rhythm.  No murmur, rub or gallop.  No lower extremity edema. Respiratory:  Clear to auscultation bilaterally.  No wheezes, rales or rhonchi.  Normal respiratory effort. Psychiatric: Grossly normal mood and affect.  Speech fluent and appropriate.  Data Reviewed: BMP unremarkable yesterday Hemoglobin stable at 9.9 yesterday  Family Communication:   Disposition: Status is: Inpatient      Time spent: 35 minutes  Author: Toribio Door, MD 12/24/2023 12:14 PM  For on call review [email or web address].

## 2023-12-24 NOTE — Hospital Course (Addendum)
 83 year old male PMH including recent elective total knee arthroplasty 11/17, CABG, CAD, aortic stenosis status post AVR, orthostatic hypotension, presented with syncope on the way to the bathroom.  Admitted for further evaluation of syncope.  [facility]   Procedures/Events None

## 2023-12-24 NOTE — Discharge Summary (Signed)
 Physician Discharge Summary   Patient: [name] [name] MRN: [id number] DOB: 1940/10/29  Admit date:     12/21/2023  Discharge date: 12/24/23  Discharge Physician: Toribio Door   PCP: Loreli Kins, MD   Recommendations at discharge:   Syncope secondary to orthostatic hypotension Orthostatic hypotension Cardiology recommended holding blood pressure medications on discharge. If BP starts to climb over 160 systolic, could consider restarting low dose coreg  3.125 mg BID. Follow-up with Dr. Raford or APP.   Multiple pulmonary nodules  Most significant: Solid pulmonary nodule within the upper lobe measuring 4 mm. No follow-up needed if patient is low-risk (and has no known or suspected primary neoplasm). Non-contrast chest CT can be considered in 12 months if patient is high-risk.  Discharge Diagnoses: Principal Problem:   Syncope Active Problems:   Orthostatic syncope Elevated troponin Status post elective right TKA 11/17 for osteoarthritis ABLA related to recent surgery PMH CAD Status post CABG Chronic diastolic CHF Aortic stenosis status post AVR CKD stage IIIa Hypovolemic hyponatremia Multiple pulmonary nodules   Hospital Course: 83 year old male PMH including recent elective total knee arthroplasty 11/17, CABG, CAD, aortic stenosis status post AVR, orthostatic hypotension, presented with syncope on the way to the bathroom.  Admitted for further evaluation of syncope.  Consultants Orthopedics Cardiology   Procedures/Events None   Syncope secondary to orthostatic hypotension Orthostatic hypotension Elevated troponin CT chest no PE, CT head no acute abnormalities, echocardiogram showed preserved LVEF, no changes noted Orthostatics improved today, patient asymptomatic.   Cardiology recommended holding blood pressure medications on discharge. If BP starts to climb over 160 systolic, could consider restarting low dose coreg  3.125 mg BID. Follow-up with Dr. Raford or  APP.   Status post elective right TKA 11/17 for osteoarthritis ABLA related to recent surgery Seen by orthopedics 12/1 in the hospital, will follow-up next week. Hemoglobin stable at 9.9 after hydration.   PMH CAD Status post CABG Chronic diastolic CHF Aortic stenosis status post AVR Stable.  Management per cardiology   CKD stage IIIa Stable   Hypovolemic hyponatremia Resolved, secondary to poor fluid intake   Multiple pulmonary nodules  Most significant: Solid pulmonary nodule within the upper lobe measuring 4 mm. No follow-up needed if patient is low-risk (and has no known or suspected primary neoplasm). Non-contrast chest CT can be considered in 12 months if patient is high-risk.  Disposition: Home Diet recommendation:  Regular diet DISCHARGE MEDICATION: Allergies as of 12/24/2023       Reactions   Hydrochlorothiazide Swelling   Lips swelled   Regadenoson  Other (See Comments)   Hypotension, decreased HR after given for NM Lexi Scan. See note on 07/08/23 by RN McKenzie for more information.        Medication List     PAUSE taking these medications    carvedilol  6.25 MG tablet Wait to take this until your doctor or other care provider tells you to start again. Commonly known as: COREG  Take 1 tablet (6.25 mg total) by mouth 2 (two) times daily.       STOP taking these medications    lisinopril  20 MG tablet Commonly known as: ZESTRIL    methimazole  5 MG tablet Commonly known as: TAPAZOLE    spironolactone  25 MG tablet Commonly known as: ALDACTONE        TAKE these medications    acetaminophen  500 MG tablet Commonly known as: TYLENOL  Take 1,000 mg by mouth every 6 (six) hours as needed for headache (pain).   acyclovir  400 MG tablet Commonly known as:  ZOVIRAX  Take 1 tablet (400 mg total) by mouth daily.   amoxicillin  500 MG tablet Commonly known as: AMOXIL  TAKE 4 TABLETS 1 HOUR PRIOR TO PROCEDURES   atorvastatin  40 MG tablet Commonly known as:  LIPITOR  Take 1 tablet (40 mg total) by mouth daily.   docusate sodium  100 MG capsule Commonly known as: COLACE Take 100 mg by mouth daily as needed for mild constipation.   esomeprazole 20 MG capsule Commonly known as: NEXIUM Take 20 mg by mouth every other day. Reported on 03/01/2015   methocarbamol  500 MG tablet Commonly known as: ROBAXIN  Take 1 tablet (500 mg total) by mouth every 6 (six) hours as needed for muscle spasms.   ondansetron  4 MG tablet Commonly known as: ZOFRAN  Take 1 tablet (4 mg total) by mouth every 6 (six) hours as needed for nausea.   oxybutynin  5 MG tablet Commonly known as: DITROPAN  Take 1 tablet (5 mg total) by mouth 2 (two) times daily.   oxyCODONE  5 MG immediate release tablet Commonly known as: Oxy IR/ROXICODONE  Take 1 tablet (5 mg total) by mouth every 4 (four) hours as needed for severe pain (pain score 7-10).   prednisoLONE  acetate 1 % ophthalmic suspension Commonly known as: PRED FORTE  Place 1 drop into the left eye daily.   sildenafil 100 MG tablet Commonly known as: VIAGRA Take 100 mg by mouth daily as needed for erectile dysfunction.   timolol  0.5 % ophthalmic solution Commonly known as: TIMOPTIC  Place 1 drop into the left eye 2 (two) times daily.   traMADol  50 MG tablet Commonly known as: ULTRAM  Take 1-2 tablets (50-100 mg total) by mouth every 6 (six) hours as needed for moderate pain (pain score 4-6).   Xarelto  10 MG Tabs tablet Generic drug: rivaroxaban  Take 1 tablet (10 mg total) by mouth daily with breakfast for 20 days. Then resume one 81 mg aspirin  once a day        Follow-up Information     Raford Riggs, MD Follow up.   Specialty: Cardiology Why: Office will contact you with an appointment Contact information: [address] [place] [address] [phone]                Discharge Exam: Fredricka Weights   12/22/23 2050 12/23/23 0500 12/24/23 0500  Weight: 76.6 kg 76.8 kg 75.9 kg   See progress  note same day  Condition at discharge: good  The results of significant diagnostics from this hospitalization (including imaging, microbiology, ancillary and laboratory) are listed below for reference.   Imaging Studies: CT HEAD WO CONTRAST ( ) Result Date: 12/22/2023 EXAM: CT HEAD WITHOUT 12/22/2023 02:45:16 PM TECHNIQUE: CT of the head was performed without the administration of intravenous contrast. Automated exposure control, iterative reconstruction, and/or weight based adjustment of the mA/kV was utilized to reduce the radiation dose to as low as reasonably achievable. COMPARISON: None available. CLINICAL HISTORY: syncope FINDINGS: BRAIN AND VENTRICLES: No acute intracranial hemorrhage. No mass effect or midline shift. No extra-axial fluid collection. No evidence of acute infarct. No hydrocephalus. Age related atrophy. Mild periventricular chronic small vessel ischemia. ORBITS: Post bilateral cataract resection. Left glaucoma drainage device. SINUSES AND MASTOIDS: No acute abnormality. SOFT TISSUES AND SKULL: No acute skull fracture. No acute soft tissue abnormality. Atherosclerosis of skullbase vasculature. IMPRESSION: 1. No acute intracranial abnormality 2. Age related atrophy and mild periventricular chronic small vessel ischemia. Electronically signed by: Franky Stanford MD 12/22/2023 11:53 PM EST RP Workstation: [id number]   ECHOCARDIOGRAM COMPLETE Result Date: 12/22/2023  ECHOCARDIOGRAM REPORT   Patient Name:   [name] [name] Date of Exam: 12/22/2023 Medical Rec #:  [id number]       Height:       73.0 in Accession #:    [id number]      Weight:       172.0 lb Date of Birth:  1941-01-11       BSA:          2.018 m Patient Age:    83 years        BP:           127/85 mmHg Patient Gender: M               HR:           59 bpm. Exam Location:  Inpatient Procedure: 2D Echo (Both Spectral and Color Flow Doppler were utilized during            procedure). Indications:    Syncope  History:         Patient has prior history of Echocardiogram examinations. CAD.                 Aortic Valve: 25 mm Magna bioprosthetic valve is present in the                 aortic position. Procedure Date: 09/2014.  Sonographer:    Charmaine Gaskins Referring Phys: [id number] CAROLE N HALL IMPRESSIONS  1. Left ventricular ejection fraction, by estimation, is 60 to 65%. The left ventricle has normal function. The left ventricle demonstrates regional wall motion abnormalities (see scoring diagram/findings for description). Left ventricular diastolic parameters are consistent with Grade I diastolic dysfunction (impaired relaxation). There is dyskinesis of the left ventricular, basal inferolateral wall.  2. Right ventricular systolic function is normal. The right ventricular size is normal. There is normal pulmonary artery systolic pressure.  3. Left atrial size was moderately dilated.  4. The mitral valve is normal in structure. Trivial mitral valve regurgitation. No evidence of mitral stenosis.  5. The aortic valve has been repaired/replaced. There is mild calcification of the aortic valve. Aortic valve regurgitation is not visualized. Mild to moderate aortic valve stenosis. There is a 25 mm Magna bioprosthetic valve present in the aortic position. Procedure Date: 09/2014. Aortic valve area, by VTI measures 1.50 cm. Aortic valve mean gradient measures 24.7 mmHg. Aortic valve Vmax measures 3.47 m/s.  6. The inferior vena cava is normal in size with greater than 50% respiratory variability, suggesting right atrial pressure of 3 mmHg. Comparison(s): Prior images reviewed side by side. Conclusion(s)/Recommendation(s): AVR mean gradient slightly increased from earlier this year, but AVA and DI similar to improved. Normal EF with previously noted focal wall abnormalities. FINDINGS  Left Ventricle: Left ventricular ejection fraction, by estimation, is 60 to 65%. The left ventricle has normal function. The left ventricle demonstrates regional  wall motion abnormalities. The left ventricular internal cavity size was normal in size. There is no left ventricular hypertrophy. Left ventricular diastolic parameters are consistent with Grade I diastolic dysfunction (impaired relaxation). Right Ventricle: The right ventricular size is normal. No increase in right ventricular wall thickness. Right ventricular systolic function is normal. There is normal pulmonary artery systolic pressure. The tricuspid regurgitant velocity is 2.84 m/s, and  with an assumed right atrial pressure of 3 mmHg, the estimated right ventricular systolic pressure is 35.3 mmHg. Left Atrium: Left atrial size was moderately dilated. Right Atrium: Right atrial size was normal in size. Pericardium:  There is no evidence of pericardial effusion. Mitral Valve: The mitral valve is normal in structure. Trivial mitral valve regurgitation. No evidence of mitral valve stenosis. MV peak gradient, 5.4 mmHg. The mean mitral valve gradient is 2.0 mmHg. Tricuspid Valve: The tricuspid valve is normal in structure. Tricuspid valve regurgitation is mild . No evidence of tricuspid stenosis. Aortic Valve: DI 0.39. The aortic valve has been repaired/replaced. There is mild calcification of the aortic valve. Aortic valve regurgitation is not visualized. Mild to moderate aortic stenosis is present. Aortic valve mean gradient measures 24.7 mmHg.  Aortic valve peak gradient measures 48.3 mmHg. Aortic valve area, by VTI measures 1.50 cm. There is a 25 mm Magna bioprosthetic valve present in the aortic position. Procedure Date: 09/2014. Pulmonic Valve: The pulmonic valve was not well visualized. Pulmonic valve regurgitation is mild. No evidence of pulmonic stenosis. Aorta: The aortic root and ascending aorta are structurally normal, with no evidence of dilitation. Venous: The inferior vena cava is normal in size with greater than 50% respiratory variability, suggesting right atrial pressure of 3 mmHg. IAS/Shunts: The  atrial septum is grossly normal.  LEFT VENTRICLE PLAX 2D LVIDd:         3.90 cm   Diastology LVIDs:         2.50 cm   LV e' medial:    5.98 cm/s LV PW:         1.00 cm   LV E/e' medial:  12.3 LV IVS:        1.00 cm   LV e' lateral:   10.40 cm/s LVOT diam:     2.20 cm   LV E/e' lateral: 7.0 LV SV:         98 LV SV Index:   48 LVOT Area:     3.80 cm  RIGHT VENTRICLE RV Basal diam:  2.90 cm RV Mid diam:    2.50 cm RV S prime:     16.50 cm/s LEFT ATRIUM              Index        RIGHT ATRIUM           Index LA diam:        3.00 cm  1.49 cm/m   RA Area:     18.10 cm LA Vol (A2C):   113.0 ml 55.99 ml/m  RA Volume:   44.20 ml  21.90 ml/m LA Vol (A4C):   73.5 ml  36.42 ml/m LA Biplane Vol: 92.8 ml  45.99 ml/m  AORTIC VALVE AV Area (Vmax):    1.35 cm AV Area (Vmean):   1.49 cm AV Area (VTI):     1.50 cm AV Vmax:           347.33 cm/s AV Vmean:          231.000 cm/s AV VTI:            0.651 m AV Peak Grad:      48.3 mmHg AV Mean Grad:      24.7 mmHg LVOT Vmax:         123.00 cm/s LVOT Vmean:        90.500 cm/s LVOT VTI:          0.257 m LVOT/AV VTI ratio: 0.39  AORTA Ao Root diam: 3.40 cm Ao Asc diam:  3.40 cm MITRAL VALVE                TRICUSPID VALVE MV Area (PHT): 5.84 cm  TV Peak grad:   32.7 mmHg MV Area VTI:   2.80 cm     TV Vmax:        2.86 m/s MV Peak grad:  5.4 mmHg     TR Peak grad:   32.3 mmHg MV Mean grad:  2.0 mmHg     TR Vmax:        284.00 cm/s MV Vmax:       1.16 m/s MV Vmean:      66.1 cm/s    SHUNTS MV Decel Time: 130 msec     Systemic VTI:  0.26 m MV E velocity: 73.30 cm/s   Systemic Diam: 2.20 cm MV A velocity: 102.00 cm/s MV E/A ratio:  0.72 Shelda Bruckner MD Electronically signed by Shelda Bruckner MD Signature Date/Time: 12/22/2023/11:11:58 AM    Final    CT Angio Chest PE W and/or Wo Contrast Result Date: 12/21/2023 CLINICAL DATA:  High probability for PE. EXAM: CT ANGIOGRAPHY CHEST WITH CONTRAST TECHNIQUE: Multidetector CT imaging of the chest was performed using the  standard protocol during bolus administration of intravenous contrast. Multiplanar CT image reconstructions and MIPs were obtained to evaluate the vascular anatomy. RADIATION DOSE REDUCTION: This exam was performed according to the departmental dose-optimization program which includes automated exposure control, adjustment of the mA and/or kV according to patient size and/or use of iterative reconstruction technique. CONTRAST:  75mL OMNIPAQUE  IOHEXOL  350 MG/ML SOLN COMPARISON:  CT chest 07/24/2006 FINDINGS: Cardiovascular: Satisfactory opacification of the pulmonary arteries to the segmental level. No evidence of pulmonary embolism. Normal heart size. No pericardial effusion. There are atherosclerotic calcifications of the aorta. Aortic valve replacement present. Mediastinum/Nodes: No enlarged mediastinal, hilar, or axillary lymph nodes. Thyroid  gland, trachea, and esophagus demonstrate no significant findings. Lungs/Pleura: There is stable mild elevation of the left hemidiaphragm. There are atelectatic changes in the bilateral lower lobes. There is no pleural effusion or pneumothorax. There are few scattered nodular densities in the upper lobes measuring up to 4 mm. Upper Abdomen: Right renal cyst with peripheral calcifications present. Simple left renal cyst partially imaged. There are punctate calculi in the left kidney. Musculoskeletal: Sternotomy wires are present. No acute fractures are seen. Review of the MIP images confirms the above findings. IMPRESSION: 1. No evidence for pulmonary embolism. 2. Bibasilar atelectasis. 3. Multiple pulmonary nodules. Most significant: Solid pulmonary nodule within the upper lobe measuring 4 mm. No follow-up needed if patient is low-risk (and has no known or suspected primary neoplasm). Non-contrast chest CT can be considered in 12 months if patient is high-risk. This recommendation follows the consensus statement: Guidelines for Management of Incidental Pulmonary Nodules  Detected on CT Images: From the Fleischner Society 2017; Radiology 2017; 284:228-243. 4. Aortic atherosclerosis. Aortic Atherosclerosis (ICD10-I70.0). Electronically Signed   By: Greig Pique M.D.   On: 12/21/2023 19:50   DG Chest 2 View Result Date: 12/21/2023 CLINICAL DATA:  Syncope EXAM: CHEST - 2 VIEW COMPARISON:  CHEST x-ray 07/08/2023 FINDINGS: Sternotomy wires are present. The heart size and mediastinal contours are within normal limits. Both lungs are clear. The visualized skeletal structures are unremarkable.There is stable elevation of the left hemidiaphragm. IMPRESSION: No active cardiopulmonary disease. Electronically Signed   By: Greig Pique M.D.   On: 12/21/2023 18:42    Microbiology: Results for orders placed or performed during the hospital encounter of 11/25/23  Surgical pcr screen     Status: None   Collection Time: 11/25/23  3:04 PM   Specimen: Nasal Mucosa; Nasal Swab  Result Value Ref Range Status   MRSA, PCR NEGATIVE NEGATIVE Final   Staphylococcus aureus NEGATIVE NEGATIVE Final    Comment: (NOTE) The Xpert SA Assay (FDA approved for NASAL specimens in patients 34 years of age and older), is one component of a comprehensive surveillance program. It is not intended to diagnose infection nor to guide or monitor treatment. Performed at [facility], [address] [place], [place] [address]     Labs: CBC: Recent Labs  Lab 12/21/23 1806 12/22/23 0329 12/23/23 0403  WBC 12.9* 10.6* 9.0  NEUTROABS  --  8.4* 6.8  HGB 10.2* 9.4* 9.9*  HCT 31.1* 28.5* 29.5*  MCV 98.7 96.6 96.4  PLT 437* 417* 420*   Basic Metabolic Panel: Recent Labs  Lab 12/21/23 1806 12/22/23 0329 12/23/23 0403  NA 134* 135 137  K 4.3 4.0 4.4  CL 102 102 103  CO2 21* 26 25  GLUCOSE 153* 128* 99  BUN 26* 25* 19  CREATININE 1.28* 1.15 1.10  CALCIUM  8.9 9.1 9.6  MG  --  2.0  --   PHOS  --  3.1  --    Liver Function Tests: Recent Labs  Lab 12/21/23 1806  12/22/23 0329  AST 40 31  ALT 41 36  ALKPHOS 79 78  BILITOT 1.5* 1.2  PROT 6.5 6.2*  ALBUMIN  3.3* 3.3*   CBG: No results for input(s): GLUCAP in the last 168 hours.  Discharge time spent: greater than 30 minutes.  Signed: Toribio Door, MD [facility] 12/24/2023

## 2023-12-25 ENCOUNTER — Telehealth: Payer: Self-pay | Admitting: Cardiovascular Disease

## 2023-12-25 NOTE — Telephone Encounter (Signed)
 Spoke with wife regarding HR and blood pressure readings Blood pressure readings today below, HR readings  [phone]  116/77 137/79 130/73 2 pm  95/73 3:30 pm  134/78  Home from hospital yesterday  Wife concerned about heart rates going up, advised still within the normal range  Carvedilol  on hold until seen by cardiology  Per wife patient feels fine  F/U post hospital scheduled for 12/19 however was told needed to be seen sooner  Advised would forward to Caitlin and Dr Raford for review

## 2023-12-25 NOTE — Telephone Encounter (Signed)
 STAT if HR is under 50 or over 120 (normal HR is 60-100 beats per minute)  What is your heart rate? High hr   Do you have a log of your heart rate readings (document readings)? [phone]   Do you have any other symptoms? No   Pt had a recent ED visit after blood pressure dropped and he passed out. Pt was advised to discontinue BP medication. Today, wife has taken his BP and HR every hour and she is concerned about pulse readings. She states these are much higher than his typical and is worried. Please advise.

## 2023-12-26 NOTE — Telephone Encounter (Signed)
 Left the pt and wife a message to call the office back to endorse recommendations as indicated in this message, per Reche Finder, NP.

## 2023-12-26 NOTE — Telephone Encounter (Signed)
 Pt's wife is returning call to a nurse

## 2023-12-26 NOTE — Telephone Encounter (Signed)
 Was able to connect with the pts wife.   Advised the wife [name] [name] (on [place]) of [name] Finder NP recommendations.   [name] [name] is aware to have the pt follow-up as scheduled on 01/09/24.  [name] [name] is aware to call us  beforehand if the pts HR>100 bpm at rest OR systolic BP is 160 or greater.   Advised the wife to make sure the pt is staying plenty hydrated and eating regular meals.   [name] [name] states his BP/HR today was 131/78 HR-79.    [name] [name] that she should be monitoring his BP once per day after he has been sitting 5-10 mins.   Wife verbalized understanding and agrees with this plan.

## 2023-12-26 NOTE — Telephone Encounter (Signed)
 01/09/24 follow up is appropriate. This follow up visit was made by our cardiology partners who saw him while hospitalized.  Recommend checking BP just once per day after sitting 5-10 minutes.   Heart rate is in acceptable range. It is likely higher than they have seen previously as some as his previous blood pressure medicines also lower the heart rate.  These were stopped due to syncope while admitted and agree with this choice.  Ensure staying well hydrated, eating regular meals.   If heart rate consistently more than 100 bpm at rest or BP consistently more than 160 (top number) prior to follow up let us  know.   [name] Tabet S Channelle Bottger, NP

## 2023-12-29 DIAGNOSIS — M25561 Pain in right knee: Secondary | ICD-10-CM | POA: Diagnosis not present

## 2023-12-29 DIAGNOSIS — M1991 Primary osteoarthritis, unspecified site: Secondary | ICD-10-CM | POA: Diagnosis not present

## 2023-12-29 DIAGNOSIS — M1712 Unilateral primary osteoarthritis, left knee: Secondary | ICD-10-CM | POA: Diagnosis not present

## 2023-12-29 DIAGNOSIS — M25562 Pain in left knee: Secondary | ICD-10-CM | POA: Diagnosis not present

## 2023-12-29 DIAGNOSIS — R262 Difficulty in walking, not elsewhere classified: Secondary | ICD-10-CM | POA: Diagnosis not present

## 2023-12-29 DIAGNOSIS — M1711 Unilateral primary osteoarthritis, right knee: Secondary | ICD-10-CM | POA: Diagnosis not present

## 2023-12-31 DIAGNOSIS — M1991 Primary osteoarthritis, unspecified site: Secondary | ICD-10-CM | POA: Diagnosis not present

## 2023-12-31 DIAGNOSIS — R262 Difficulty in walking, not elsewhere classified: Secondary | ICD-10-CM | POA: Diagnosis not present

## 2023-12-31 DIAGNOSIS — M1712 Unilateral primary osteoarthritis, left knee: Secondary | ICD-10-CM | POA: Diagnosis not present

## 2023-12-31 DIAGNOSIS — M25562 Pain in left knee: Secondary | ICD-10-CM | POA: Diagnosis not present

## 2023-12-31 DIAGNOSIS — M25561 Pain in right knee: Secondary | ICD-10-CM | POA: Diagnosis not present

## 2023-12-31 DIAGNOSIS — M1711 Unilateral primary osteoarthritis, right knee: Secondary | ICD-10-CM | POA: Diagnosis not present

## 2024-01-09 ENCOUNTER — Encounter (HOSPITAL_BASED_OUTPATIENT_CLINIC_OR_DEPARTMENT_OTHER): Payer: Self-pay | Admitting: Family

## 2024-01-09 ENCOUNTER — Ambulatory Visit (HOSPITAL_BASED_OUTPATIENT_CLINIC_OR_DEPARTMENT_OTHER): Admitting: Family

## 2024-01-09 VITALS — BP 138/84 | HR 81 | Ht 73.0 in | Wt 166.0 lb

## 2024-01-09 DIAGNOSIS — R55 Syncope and collapse: Secondary | ICD-10-CM

## 2024-01-09 DIAGNOSIS — I10 Essential (primary) hypertension: Secondary | ICD-10-CM

## 2024-01-09 DIAGNOSIS — Z952 Presence of prosthetic heart valve: Secondary | ICD-10-CM

## 2024-01-09 DIAGNOSIS — E785 Hyperlipidemia, unspecified: Secondary | ICD-10-CM | POA: Diagnosis not present

## 2024-01-09 DIAGNOSIS — I25118 Atherosclerotic heart disease of native coronary artery with other forms of angina pectoris: Secondary | ICD-10-CM | POA: Diagnosis not present

## 2024-01-09 LAB — BASIC METABOLIC PANEL WITH GFR
BUN/Creatinine Ratio: 14 (ref 10–24)
BUN: 20 mg/dL (ref 8–27)
CO2: 24 mmol/L (ref 20–29)
Calcium: 10 mg/dL (ref 8.6–10.2)
Chloride: 100 mmol/L (ref 96–106)
Creatinine, Ser: 1.45 mg/dL — ABNORMAL HIGH (ref 0.76–1.27)
Glucose: 99 mg/dL (ref 70–99)
Potassium: 5.2 mmol/L (ref 3.5–5.2)
Sodium: 137 mmol/L (ref 134–144)
eGFR: 48 mL/min/1.73 — ABNORMAL LOW

## 2024-01-09 LAB — CBC
Hematocrit: 38.4 % (ref 37.5–51.0)
Hemoglobin: 12.5 g/dL — ABNORMAL LOW (ref 13.0–17.7)
MCH: 32 pg (ref 26.6–33.0)
MCHC: 32.6 g/dL (ref 31.5–35.7)
MCV: 98 fL — ABNORMAL HIGH (ref 79–97)
Platelets: 242 x10E3/uL (ref 150–450)
RBC: 3.91 x10E6/uL — ABNORMAL LOW (ref 4.14–5.80)
RDW: 13.1 % (ref 11.6–15.4)
WBC: 5.6 x10E3/uL (ref 3.4–10.8)

## 2024-01-09 NOTE — Progress Notes (Signed)
 " Cardiology Office Note:  .   Date:  01/09/2024  ID:  [name] [name], DOB 1940/02/25, MRN [id number] PCP: Loreli Kins, MD  [facility] Providers Cardiologist:  Annabella Scarce, MD    History of Present Illness: .   RAYMIR FROMMELT is a 83 y.o. male with hx of CAD s/p CABG 2016 (LIMA-LAD, SVG-R1/OM1, SVG-PDA), aortic stenosis s/p AVR 2016, bradycardia, syncope, carotid stenosis.  He did have postoperative atrial fibrillation/flutter after 2016 CABG/AVR but discharged in sinus rhythm has not required long-term anticoagulation.    Metoprolol  previously switched to carvedilol  due to bradycardia and hypertension.  Simvastatin  changed to atorvastatin  so as to not interact with amlodipine .  Amlodipine  later reduced due to edema and spironolactone  initiated.  He had episode of syncope in the setting of hypotension, working outside, poor oral intake.  Antihypertensive regimen previously reduced.    Echo 10/04/2021 normal LVEF 60 to 65%, no RWMA, moderate asymmetric LVH, grade 2 diastolic dysfunction, RV SF normal, mild MR, aortic valve replacement with mean gradient 14 mmHg with normal prosthetic function.  Carotid duplex 09/2021 left 1-39% stenosis.  At visit 03/2023 BP elevated in clinic but controlled at home.  Subsequent echo LVEF 55 to 60%, grade 1 diastolic dysfunction, RV mildly enlarged, bioprosthetic aortic valve stable, mean gradient 60 mmHg having increased from 40 mmHg.  Stress PET 07/18/2023 LVEF 39% with no LV ischemia nor infarction, cardial blood flow reserve 1.35.  11/28/23 underwent total knee arthroplasty.   Admitted 11/30-12/3/25 with syncope secondary to orthostatic hypotension.  CT chest no PE, CT head no acute abnormalities, echo 12/22/2023 preserved LVEF 60 to 65%, grade 1 diastolic dysfunction, RV normal, normal PASP, mild to moderate aortic valve stenosis with mean gradient 24.7 mmHg.  AVR mean gradient slightly increased but AVA and DI similar to improved.  Lisinopril ,  spironolactone , carvedilol  held.  Presents today for follow-up with his wife.  His average BP over the last 18 readings is 131/84.  Heart rate at home 70s-90s.  Denies palpitations.  Participating in PT twice per week.  No recurrent lightheadedness, dizziness.  He does note poor p.o. intake related to decreased appetite.  He drinks less than 32 ounces of fluid per day.  Of note has resumed spironolactone  12.5 mg daily as misunderstood hospital discharge instructions.  ROS: Please see the history of present illness.    All other systems reviewed and are negative.   Studies Reviewed: .        Cardiac Studies & Procedures   ______________________________________________________________________________________________ CARDIAC CATHETERIZATION  CARDIAC CATHETERIZATION 09/23/2014  Conclusion Images from the original result were not included.  Severe multivessel disease:  Prox LAD to Mid LAD lesion, 95% stenosed. Ramus lesion, 95% stenosed. Ost 1st Diag lesion, 90% stenosed.  Dist RCA lesion, 90% stenosed.  1st Mrg lesion, 60% stenosed. Dist LAD lesion, 40% stenosed.  There is mild left ventricular systolic dysfunction. There does appear to be a distal LAD distribution wall motion abnormality   Severe multivessel disease involving the ostial-proximal LAD and what appears to be either a bifurcating diagonal or ramus and diagonal branch. There is also focal distal RCA disease.  Based on the location of the LAD lesion, there is not adequate landing zone for a stent prior to the Left Main. Additionally, with involvement of 2 major branches this would be a very high-risk PCI. Overall best recommendation would be to pursue CABG.  The patient also has a significant aortic valve murmur with gradient noted by catheterization. Recommend echocardiogram  to better assess left ventricular outflow/aortic valve stenosis. Pending the severity of aortic valve disease, would consider potentially Performing  Aortic Valve Replacement in Addition to CABG.   Plan: The patient will be admitted to the TCU STEPDOWN Unit for close monitoring. Cardiac surgery has been consulted by Dr. Francyne Will initiate IV heparin  6 hours following TR band removal Aggressive risk factor modification. Blood pressure were tolerated, would consider IV nitroglycerin .    ANNER ALM ORN, M.D., M.S. Interventional Cardiologist  Pager # [phone]  Findings Coronary Findings Diagnostic  Dominance: Co-dominant  Left Main The vessel is large . Very Large  There is mild diffuse disease throughout the vessel. Mild calcification  Left Anterior Descending diffuse thrombotic eccentric ulcerative located at the major branch .   Involves the ostium of the D1 as well as Ramus Intermedius discrete .  First Diagonal Branch The vessel is moderate in size. discrete ulcerative located at the major branch .  Second Diagonal Branch The vessel is small in size.  Ramus Intermedius The vessel is moderate in size . thrombotic ulcerative .  Left Circumflex The vessel is large . The vessel is tortuous.  First Obtuse Marginal Branch The vessel is small in size.  Second Obtuse Marginal Branch The vessel is large in size.  Right Coronary Artery The vessel is large . discrete located proximal to major branch .  Acute Marginal Branch The vessel is small in size.  Right Posterior Atrioventricular Artery The vessel is small in size.  First Right Posterolateral Branch The vessel is small in size.  Intervention  No interventions have been documented.   STRESS TESTS  NM PET CT CARDIAC PERFUSION MULTI W/ABSOLUTE BLOODFLOW 07/08/2023  Narrative   LV perfusion is normal. There is no evidence of ischemia. There is no evidence of infarction.   Rest left ventricular function is abnormal. Rest global function is moderately reduced. There were no regional wall motion abnormalities. Rest EF: 39%. End diastolic  cavity size is normal. End systolic cavity size is mildly enlarged.   Myocardial blood flow was computed to be 0.12ml/g/min at rest and 1.69ml/g/min at stress. Global myocardial blood flow reserve was 1.35 and was abnormal.   Coronary calcium  assessment not performed due to prior revascularization. duet to prior CABG   Findings are consistent with no ischemia and no infarction. The study is low risk.   Normal perfusion. Stress EF not sent. Abnormal resting EF no RWMA suggest echo correlation. Abnormal MBFR in setting of prior CABG  Note patient has bradycardia and hypotension post procedure Caffeine  given with saline and sent to [facility]  EXAM: OVER-READ INTERPRETATION  CT CHEST  The following report is a limited chest CT over-read performed by radiologist Dr. Elsie Ko [facility] on 07/08/2023. This over-read does not include interpretation of cardiac or coronary anatomy or pathology nor does it include evaluation of the PET data. The cardiac PET-CT interpretation by the cardiologist is attached.  COMPARISON:  Chest radiographs 10/27/2016.  Abdominal CT 06/13/2005.  FINDINGS: Mediastinum/Nodes: No enlarged lymph nodes within the visualized mediastinum.Previous median sternotomy. Aortic and coronary artery atherosclerosis with an aortic valve replacement.  Lungs/Pleura: There is no pleural effusion. Chronic elevation of the left hemidiaphragm with associated left basilar atelectasis or scarring. The lungs otherwise appear clear.  Upper abdomen: No significant findings within the visualized upper abdomen. There are small cysts in the upper pole of the left kidney. There is a peripherally calcified cystic lesion in the upper pole of the right  kidney which has decreased in size from the remote abdominal CT. No specific follow-up imaging is recommended.  Musculoskeletal/Chest wall: No chest wall mass or suspicious osseous findings within the visualized chest. Bilateral  gynecomastia noted.  IMPRESSION: 1. No significant extracardiac findings. 2. Chronic elevation of the left hemidiaphragm with associated left basilar atelectasis or scarring. 3.  Aortic Atherosclerosis (ICD10-I70.0).   Electronically Signed By: Elsie Perone M.D. On: 07/08/2023 11:49   ECHOCARDIOGRAM  ECHOCARDIOGRAM COMPLETE 12/22/2023  Narrative ECHOCARDIOGRAM REPORT    Patient Name:   BYRD RUSHLOW Date of Exam: 12/22/2023 Medical Rec #:  [id number]       Height:       73.0 in Accession #:    [id number]      Weight:       172.0 lb Date of Birth:  07-17-1940       BSA:          2.018 m Patient Age:    83 years        BP:           127/85 mmHg Patient Gender: M               HR:           59 bpm. Exam Location:  Inpatient  Procedure: 2D Echo (Both Spectral and Color Flow Doppler were utilized during procedure).  Indications:    Syncope  History:        Patient has prior history of Echocardiogram examinations. CAD. Aortic Valve: 25 mm Magna bioprosthetic valve is present in the aortic position. Procedure Date: 09/2014.  Sonographer:    Charmaine Gaskins Referring Phys: [id number] CAROLE N HALL  IMPRESSIONS   1. Left ventricular ejection fraction, by estimation, is 60 to 65%. The left ventricle has normal function. The left ventricle demonstrates regional wall motion abnormalities (see scoring diagram/findings for description). Left ventricular diastolic parameters are consistent with Grade I diastolic dysfunction (impaired relaxation). There is dyskinesis of the left ventricular, basal inferolateral wall. 2. Right ventricular systolic function is normal. The right ventricular size is normal. There is normal pulmonary artery systolic pressure. 3. Left atrial size was moderately dilated. 4. The mitral valve is normal in structure. Trivial mitral valve regurgitation. No evidence of mitral stenosis. 5. The aortic valve has been repaired/replaced. There is mild calcification of  the aortic valve. Aortic valve regurgitation is not visualized. Mild to moderate aortic valve stenosis. There is a 25 mm Magna bioprosthetic valve present in the aortic position. Procedure Date: 09/2014. Aortic valve area, by VTI measures 1.50 cm. Aortic valve mean gradient measures 24.7 mmHg. Aortic valve Vmax measures 3.47 m/s. 6. The inferior vena cava is normal in size with greater than 50% respiratory variability, suggesting right atrial pressure of 3 mmHg.  Comparison(s): Prior images reviewed side by side.  Conclusion(s)/Recommendation(s): AVR mean gradient slightly increased from earlier this year, but AVA and DI similar to improved. Normal EF with previously noted focal wall abnormalities.  FINDINGS Left Ventricle: Left ventricular ejection fraction, by estimation, is 60 to 65%. The left ventricle has normal function. The left ventricle demonstrates regional wall motion abnormalities. The left ventricular internal cavity size was normal in size. There is no left ventricular hypertrophy. Left ventricular diastolic parameters are consistent with Grade I diastolic dysfunction (impaired relaxation).  Right Ventricle: The right ventricular size is normal. No increase in right ventricular wall thickness. Right ventricular systolic function is normal. There is normal pulmonary artery systolic pressure. The tricuspid regurgitant  velocity is 2.84 m/s, and with an assumed right atrial pressure of 3 mmHg, the estimated right ventricular systolic pressure is 35.3 mmHg.  Left Atrium: Left atrial size was moderately dilated.  Right Atrium: Right atrial size was normal in size.  Pericardium: There is no evidence of pericardial effusion.  Mitral Valve: The mitral valve is normal in structure. Trivial mitral valve regurgitation. No evidence of mitral valve stenosis. MV peak gradient, 5.4 mmHg. The mean mitral valve gradient is 2.0 mmHg.  Tricuspid Valve: The tricuspid valve is normal in structure.  Tricuspid valve regurgitation is mild . No evidence of tricuspid stenosis.  Aortic Valve: DI 0.39. The aortic valve has been repaired/replaced. There is mild calcification of the aortic valve. Aortic valve regurgitation is not visualized. Mild to moderate aortic stenosis is present. Aortic valve mean gradient measures 24.7 mmHg. Aortic valve peak gradient measures 48.3 mmHg. Aortic valve area, by VTI measures 1.50 cm. There is a 25 mm Magna bioprosthetic valve present in the aortic position. Procedure Date: 09/2014.  Pulmonic Valve: The pulmonic valve was not well visualized. Pulmonic valve regurgitation is mild. No evidence of pulmonic stenosis.  Aorta: The aortic root and ascending aorta are structurally normal, with no evidence of dilitation.  Venous: The inferior vena cava is normal in size with greater than 50% respiratory variability, suggesting right atrial pressure of 3 mmHg.  IAS/Shunts: The atrial septum is grossly normal.   LEFT VENTRICLE PLAX 2D LVIDd:         3.90 cm   Diastology LVIDs:         2.50 cm   LV e' medial:    5.98 cm/s LV PW:         1.00 cm   LV E/e' medial:  12.3 LV IVS:        1.00 cm   LV e' lateral:   10.40 cm/s LVOT diam:     2.20 cm   LV E/e' lateral: 7.0 LV SV:         98 LV SV Index:   48 LVOT Area:     3.80 cm   RIGHT VENTRICLE RV Basal diam:  2.90 cm RV Mid diam:    2.50 cm RV S prime:     16.50 cm/s  LEFT ATRIUM              Index        RIGHT ATRIUM           Index LA diam:        3.00 cm  1.49 cm/m   RA Area:     18.10 cm LA Vol (A2C):   113.0 ml 55.99 ml/m  RA Volume:   44.20 ml  21.90 ml/m LA Vol (A4C):   73.5 ml  36.42 ml/m LA Biplane Vol: 92.8 ml  45.99 ml/m AORTIC VALVE AV Area (Vmax):    1.35 cm AV Area (Vmean):   1.49 cm AV Area (VTI):     1.50 cm AV Vmax:           347.33 cm/s AV Vmean:          231.000 cm/s AV VTI:            0.651 m AV Peak Grad:      48.3 mmHg AV Mean Grad:      24.7 mmHg LVOT Vmax:          123.00 cm/s LVOT Vmean:        90.500 cm/s LVOT  VTI:          0.257 m LVOT/AV VTI ratio: 0.39  AORTA Ao Root diam: 3.40 cm Ao Asc diam:  3.40 cm  MITRAL VALVE                TRICUSPID VALVE MV Area (PHT): 5.84 cm     TV Peak grad:   32.7 mmHg MV Area VTI:   2.80 cm     TV Vmax:        2.86 m/s MV Peak grad:  5.4 mmHg     TR Peak grad:   32.3 mmHg MV Mean grad:  2.0 mmHg     TR Vmax:        284.00 cm/s MV Vmax:       1.16 m/s MV Vmean:      66.1 cm/s    SHUNTS MV Decel Time: 130 msec     Systemic VTI:  0.26 m MV E velocity: 73.30 cm/s   Systemic Diam: 2.20 cm MV A velocity: 102.00 cm/s MV E/A ratio:  0.72  Shelda Bruckner MD Electronically signed by Shelda Bruckner MD Signature Date/Time: 12/22/2023/11:11:58 AM    Final   TEE  ECHO TEE 09/28/2014  Narrative *[facility]* *[facility]* [address] [place], [place] [address] [phone]  ------------------------------------------------------------------- Intraoperative Transesophageal Echocardiography  Patient:    Cortlandt, Capuano MR #:       [id number] Study Date: 09/28/2014 Gender:     M Age:        74 Height: Weight: BSA: Pt. Status: Room:  REFERRING    Elspeth Millers, MD PERFORMING   Kelly Mace, MD SONOGRAPHER  Vernell Saba  cc:  ------------------------------------------------------------------- LV EF: 50% -   55%  ------------------------------------------------------------------- Indications:     Aortic valve repair/replacement CABG.  ------------------------------------------------------------------- History:   PMH:  Aortic valve stenosis.  ------------------------------------------------------------------- Study Conclusions  - Left ventricle: The cavity size was normal. Wall thickness was normal. Systolic function was normal. The estimated ejection fraction was in the range of 50% to 55%. Wall motion was normal; there were no regional wall motion  abnormalities. - Aortic valve: Normal-sized, mildly calcified annulus. Trileaflet; moderately calcified leaflets. Cusp separation was moderately reduced. Right coronary and noncoronary cusp mobility was restricted, with calcification of leaflet edges. The Left coronary cusp mobility appeared normal. There was very mild stenosis, AVA 1.25 cm2 (VTI).. There was trivial regurgitation. - Staged echo: limited post-CPB study: Good LVEF. EF 55-60%. Prosthetic aortic valve well seated in the Aortic annulus. No AI seen in LV outflow tract. Post aortic valve replacement images demonstrate no residual valvular insufficiency or perivalvular leak. No change post bypass in mitral valve function.  Impressions:  - Post aortic valve replacement surgery, the prosthetic valve appears to be functioning normally. Excellent prosthetic aortic valve function without evidence for perivalvular leak. Other valves unchanged. No other change from pre-bypass images.  Intraoperative transesophageal echocardiography.  Birthdate: Patient birthdate: 1940/05/25.  Age:  Patient is 83 yr old.  Sex: Gender: male.  Patient status:  Inpatient.  Study date:  Study date: 09/28/2014. Study time: 07:45 AM.  Location:  Operating room.  -------------------------------------------------------------------  ------------------------------------------------------------------- Left ventricle:  The cavity size was normal. Wall thickness was normal. Systolic function was normal. The estimated ejection fraction was in the range of 50% to 55%. Wall motion was normal; there were no regional wall motion abnormalities.  ------------------------------------------------------------------- Aortic valve:   Normal-sized, mildly calcified annulus. Trileaflet; moderately calcified leaflets. Cusp separation was moderately  reduced. Right coronary and noncoronary cusp mobility was restricted, with calcification of leaflet edges. The left  coronary cusp mobility appeared normal.  Doppler:  There was very mild stenosis, AVA 1.25 cm2 (VTI). There was trivial regurgitation.  ------------------------------------------------------------------- Aorta:  The aorta was minimallly calcified.  ------------------------------------------------------------------- Mitral valve:   Structurally normal valve. Normal thickness, noncalcified leaflets . Leaflet separation was normal. Mobility was not restricted. No echocardiographic evidence for prolapse. Doppler:   There was no evidence for stenosis.   There was no significant regurgitation.  ------------------------------------------------------------------- Left atrium:   No evidence of thrombus in the atrial cavity or appendage.  ------------------------------------------------------------------- Atrial septum:  No defect or patent foramen ovale was identified.  ------------------------------------------------------------------- Pulmonary veins:  Visualization of the pulmonary venous anatomy is incomplete, but a significant abnormality is unlikely.  ------------------------------------------------------------------- Right ventricle:  The cavity size was normal. Wall thickness was normal. Systolic function was normal.  ------------------------------------------------------------------- Pulmonic valve:    Structurally normal valve.   Cusp separation was normal.  Doppler:  There was trivial regurgitation around the PA catheter.  ------------------------------------------------------------------- Tricuspid valve:   Structurally normal valve.   Leaflet separation was normal.  Doppler:  There was mild regurgitation.  ------------------------------------------------------------------- Right atrium:  The atrium was normal in size.  No evidence of thrombus.  ------------------------------------------------------------------- Pre bypass:  Post  bypass:  ------------------------------------------------------------------- Post procedure conclusions Left ventricle:  limited post-CPB study: Good LVEF. EF 55-60%. Aortic valve:  - Prosthetic aortic valve well seated in the Aortic annulus. No AI seen in LV outflow tract. Post aortic valve replacement images demonstrate no residual valvular insufficiency or perivalvular leak.  Mitral valve:  - No change post bypass in mitral valve function.  Ascending Aorta:  - The aorta was minimallly calcified.  ------------------------------------------------------------------- Prepared and Electronically Authenticated by  Kelly Mace, MD 2016-09-07T18:09:13        ______________________________________________________________________________________________        Risk Assessment/Calculations:             Physical Exam:   VS:  BP 138/84 (BP Location: Left Arm, Patient Position: Sitting, Cuff Size: Normal)   Pulse 81   Ht 6' 1 (1.854 m)   Wt 166 lb (75.3 kg)   SpO2 94%   BMI 21.90 kg/m    Wt Readings from Last 3 Encounters:  01/09/24 166 lb (75.3 kg)  12/24/23 167 lb 5.3 oz (75.9 kg)  12/08/23 172 lb (78 kg)    GEN: Well nourished, well developed in no acute distress NECK: No JVD; No carotid bruits CARDIAC: RRR, no murmurs, rubs, gallops RESPIRATORY:  Clear to auscultation without rales, wheezing or rhonchi  ABDOMEN: Soft, non-tender, non-distended EXTREMITIES:  No edema; No deformity   ASSESSMENT AND PLAN: .    Syncope / Orthostatic hypotension / HTN -no recurrence.  Likely precipitated by poor p.o. intake, recent surgery, pain medication.  Advised regular meals and fluid intake of at least 64 ounces per day.  Average BP at home of 131/84 with BP well-controlled in clinic today.  There was some misunderstanding of hospital discharge instructions and he has resumed spironolactone  12.5 mg daily.  Update BMP, CBC today.  As long as stable may continue present dosing  Spironolactone  12.5mg  daily.  Remain off lisinopril , carvedilol .  If renal function decreased from previous plan to stop spironolactone .  CAD/HLD, LDL goal less than 70- Stable with no anginal symptoms. No indication for ischemic evaluation.  GDMT aspirin , atorvastatin . Recommend aiming for 150 minutes of moderate intensity activity per week and following  a heart healthy diet.    S/p AVR with bioprosthetic valve-Stable by recent echo. Continue SBE prophylaxis.       Dispo: follow up in 4 months  Signed, Reche GORMAN Finder, NP   "

## 2024-01-09 NOTE — Patient Instructions (Signed)
 Medication Instructions:  Remain off Lisinopril  and Carvedilol   Continue Spironolactone  12.5mg  daily (Half tablet) for now until lab results return  *If you need a refill on your cardiac medications before your next appointment, please call your pharmacy*  Lab Work: Your physician recommends that you return for lab work today: CBC, BMP   If you have labs (blood work) drawn today and your tests are completely normal, you will receive your results only by: MyChart Message (if you have MyChart) OR A paper copy in the mail If you have any lab test that is abnormal or we need to change your treatment, we will call you to review the results.  Follow-Up: At [facility], you and your health needs are our priority.  As part of our continuing mission to provide you with exceptional heart care, our providers are all part of one team.  This team includes your primary Cardiologist (physician) and Advanced Practice Providers or APPs (Physician Assistants and Nurse Practitioners) who all work together to provide you with the care you need, when you need it.  Your next appointment:   As scheduled in April  We recommend signing up for the patient portal called MyChart.  Sign up information is provided on this After Visit Summary.  MyChart is used to connect with patients for Virtual Visits (Telemedicine).  Patients are able to view lab/test results, encounter notes, upcoming appointments, etc.  Non-urgent messages can be sent to your provider as well.   To learn more about what you can do with MyChart, go to [email or web address].   Other Instructions  Recommend eating 3 meals per day to prevent drops in blood pressure.   Recommend aiming for 64 oz of fluid intake per day to prevent drops in blood pressure.   Check blood pressure about 3 times per week and keep a log, if BP at home is consistently more than 140 or less than 120 please call and let us  know!  A normal heart rate at rest is  55-99bpm.

## 2024-01-12 ENCOUNTER — Ambulatory Visit (HOSPITAL_BASED_OUTPATIENT_CLINIC_OR_DEPARTMENT_OTHER): Payer: Self-pay | Admitting: Family

## 2024-01-12 DIAGNOSIS — R7989 Other specified abnormal findings of blood chemistry: Secondary | ICD-10-CM

## 2024-01-12 DIAGNOSIS — Z79899 Other long term (current) drug therapy: Secondary | ICD-10-CM

## 2024-01-12 NOTE — Telephone Encounter (Signed)
 The patients wife [name] [name] (on [place]) has been notified of the pts result and verbalized understanding.  All questions (if any) were answered.  [name] [name] is aware the pt should increase his fluid intake and drink about 64 oz of water  a day.  [name] [name] is aware the pt needs to stop taking spironolactone  and come back into the lab in 1-2 weeks for repeat BMET for monitoring.   [name] [name] verbalized understanding and agrees with this plan.  She will endorse this plan to the pt when he returns home from his morning walk.

## 2024-01-12 NOTE — Telephone Encounter (Signed)
-----   Message from Reche Finder, NP sent at 01/12/2024  8:50 AM EST ----- Anemia much improved from prior. Kidney function with decline from previous likely due to dehydration. Recommend increase oral hydration to at least 64 oz of fluid per day. Recommend stop  Spironolactone  due to decrease in kidney function. Repeat BMP in 1-2 weeks for monitoring.

## 2024-01-20 LAB — BASIC METABOLIC PANEL WITH GFR
BUN/Creatinine Ratio: 13 (ref 10–24)
BUN: 19 mg/dL (ref 8–27)
CO2: 23 mmol/L (ref 20–29)
Calcium: 9 mg/dL (ref 8.6–10.2)
Chloride: 103 mmol/L (ref 96–106)
Creatinine, Ser: 1.45 mg/dL — ABNORMAL HIGH (ref 0.76–1.27)
Glucose: 112 mg/dL — ABNORMAL HIGH (ref 70–99)
Potassium: 5 mmol/L (ref 3.5–5.2)
Sodium: 140 mmol/L (ref 134–144)
eGFR: 48 mL/min/1.73 — ABNORMAL LOW

## 2024-01-21 NOTE — Telephone Encounter (Signed)
  Wife is returning call 

## 2024-01-25 ENCOUNTER — Other Ambulatory Visit (HOSPITAL_COMMUNITY): Payer: Self-pay

## 2024-02-17 ENCOUNTER — Encounter (HOSPITAL_BASED_OUTPATIENT_CLINIC_OR_DEPARTMENT_OTHER): Payer: Self-pay

## 2024-02-22 IMAGING — CT CT CERVICAL SPINE W/O CM
3 of 4 series · 13 of 33 positions shown, 16 images · non-contrast
Comparison: September 30, 2013

CLINICAL DATA: Neck trauma (Age >= 65y) after a fall with pain and
left sided facial swelling and bruising



[Series 8: sag bone · sagittal · 0.28mm/px · 5 of 92 slices shown, 6 images]
[im 31/92  bone]
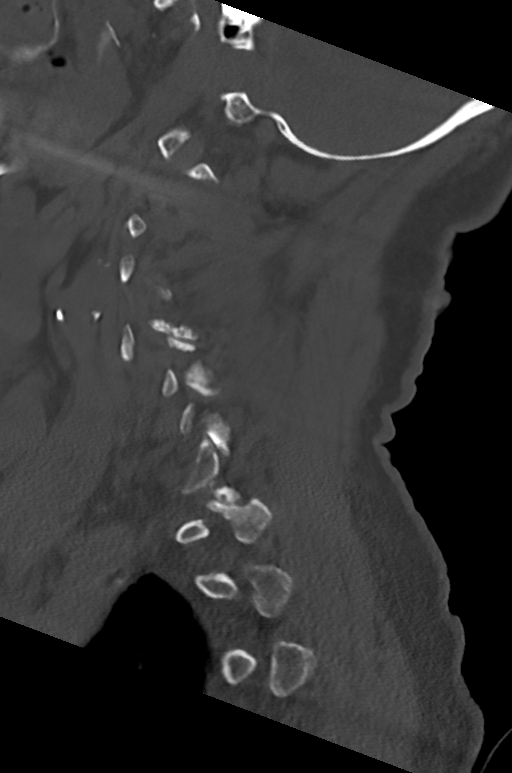
[im 38/92  bone]
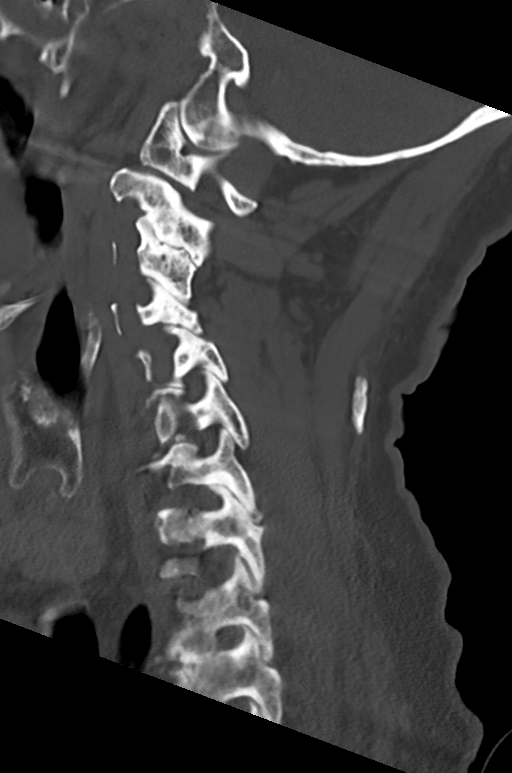
[im 46/92  soft-tissue]
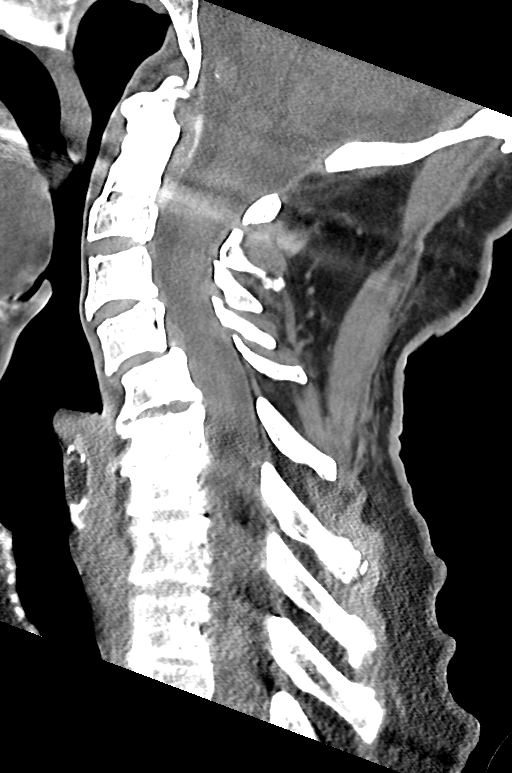
[im 46/92  bone]
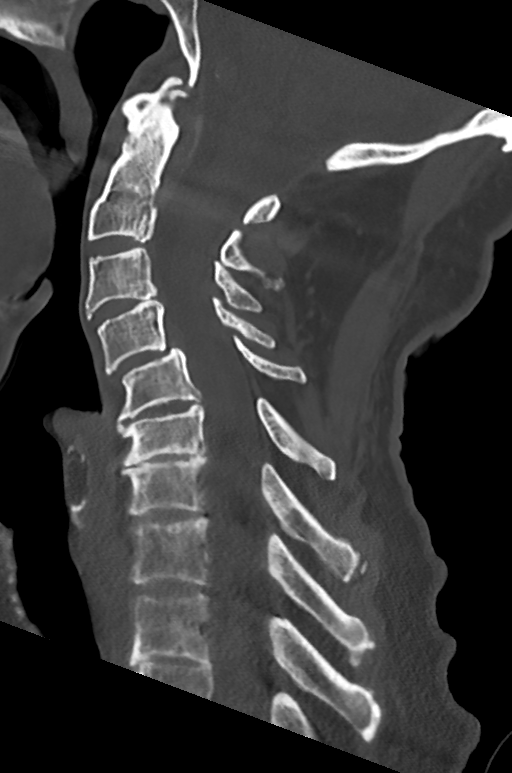
[im 54/92  bone]
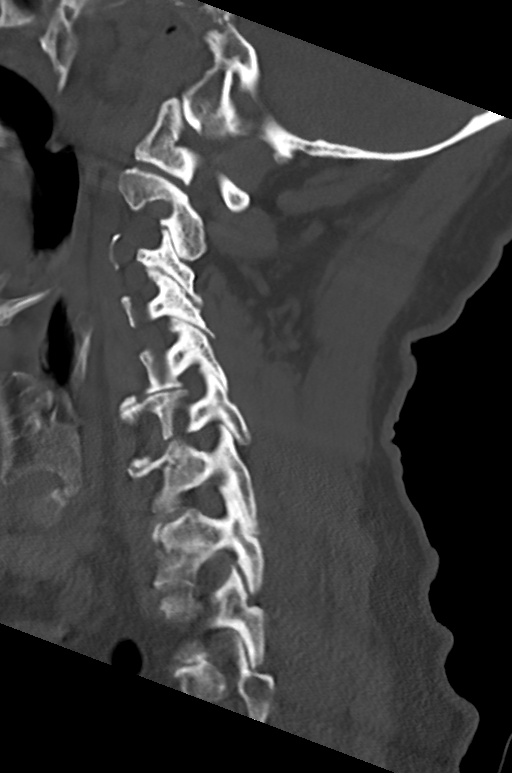
[im 61/92  bone]
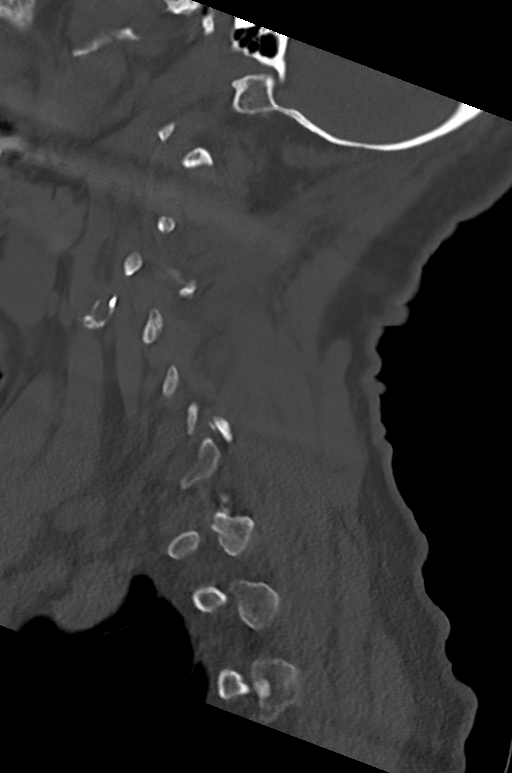

[Series 9: cor bone · coronal · 0.36mm/px · 3 of 75 slices shown]
[im 17/75  bone]
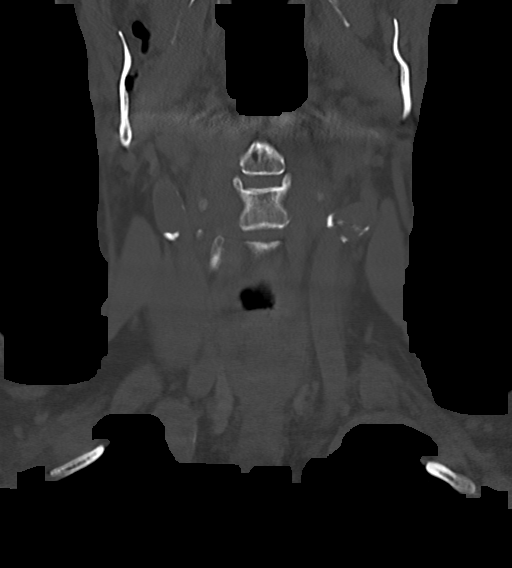
[im 31/75  bone]
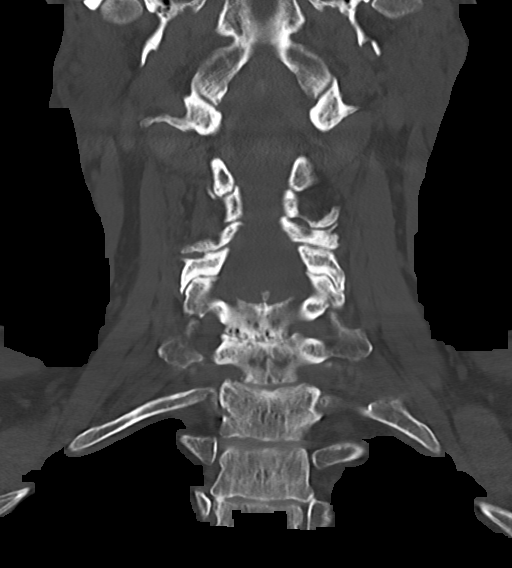
[im 45/75  bone]
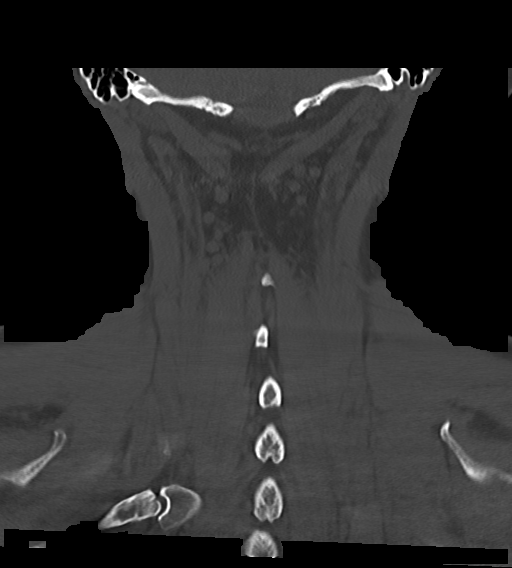

[Series 10: orthogonal axials · axial · 0.21mm/px · z∈[-234,-135]mm · 5 of 93 slices shown, 7 images]
[im 16/93  soft-tissue]
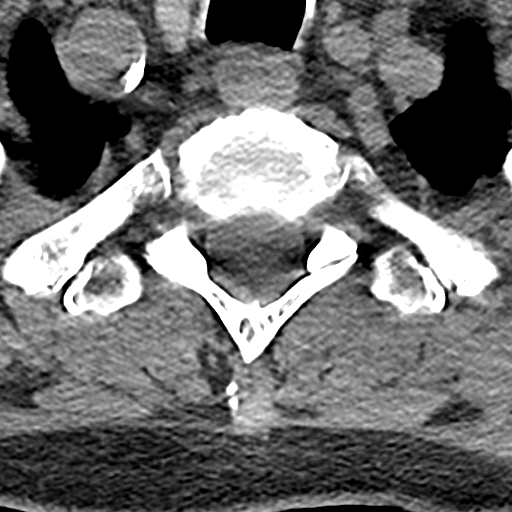
[im 16/93  bone]
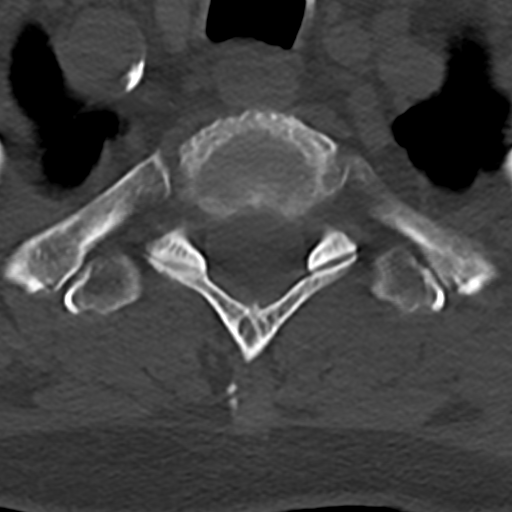
[im 31/93  bone]
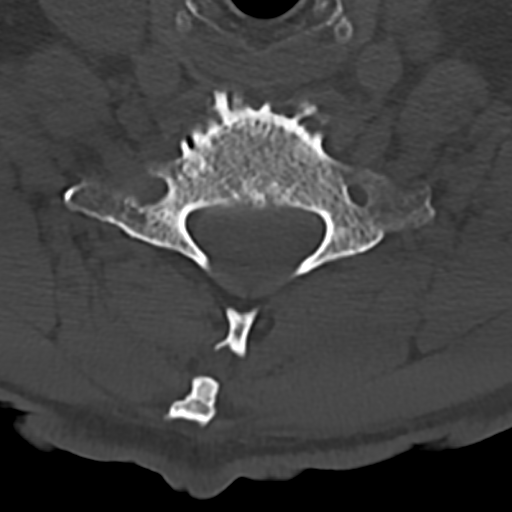
[im 47/93  bone]
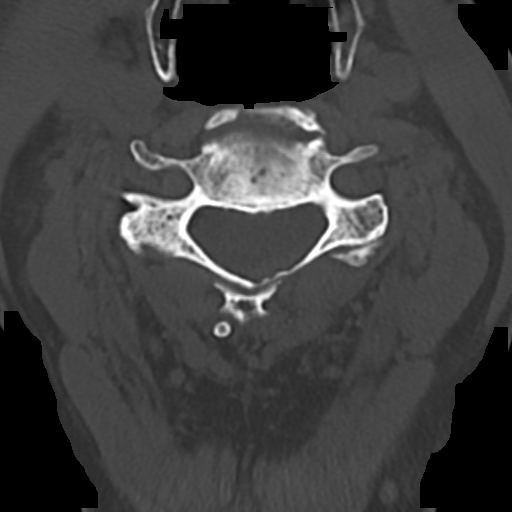
[im 62/93  bone]
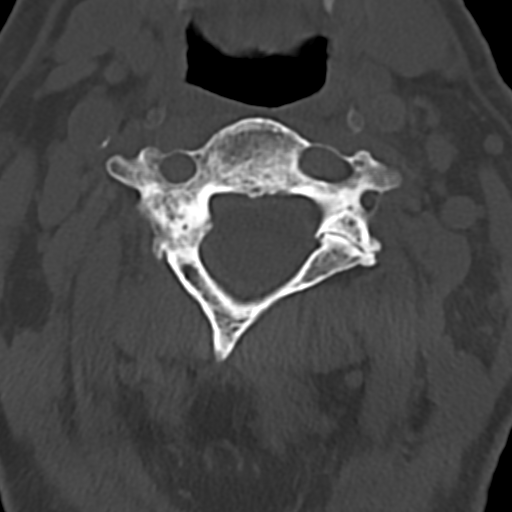
[im 77/93  soft-tissue]
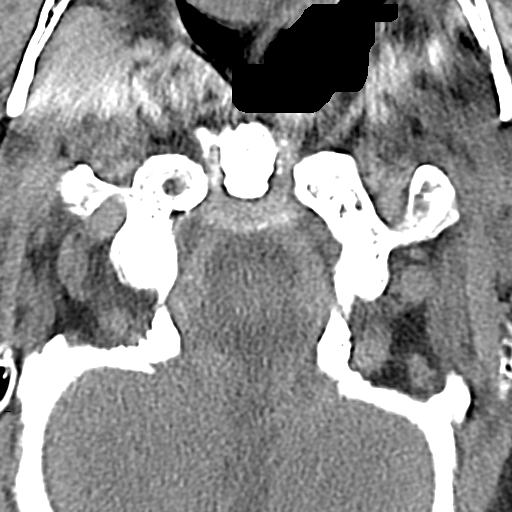
[im 77/93  bone]
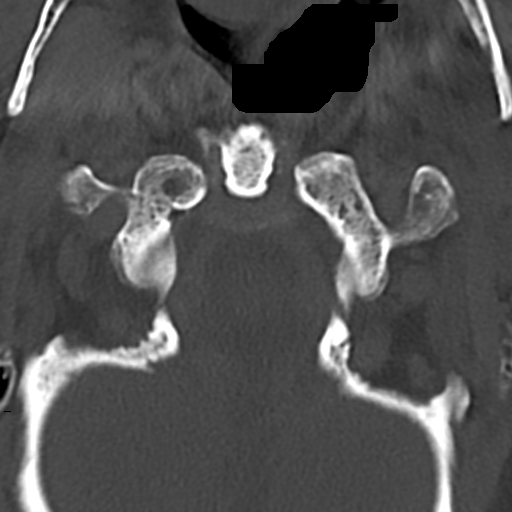

[13 of 33 positions shown; findings below may reference images not displayed]

FINDINGS: Alignment: There is 3 mm subluxation of C4 over C5 seen and has
increased in the interim.

Skull base and vertebrae: No acute fracture. No primary bone lesion
or focal pathologic process. Moderately severe degenerative changes
with prominent marginal osteophytes and facet hypertrophic changes.

Soft tissues and spinal canal: No prevertebral fluid or swelling. No
visible canal hematoma.

Disc levels: Loss of the disc space at C6-C7 anterior lesser extent
C5-C6.

Upper chest: There are couple of nodules in the left upper lobe
measuring 4 and 4.6 mm (images 84 and 85 of series 4)

Other: None.
IMPRESSION: 3 mm anterior subluxation of C4 over C5 and has increased in the
interim. No fracture or dislocation. Moderately severe degenerative
changes with prominent marginal osteophytes and facet hypertrophic
changes. Loss of the disc space at C6-C7.

Couple of 4 and 4.6 mm pulmonary nodule seen in the left upper lobe.
Recommend noncontrast CT of the chest for further evaluation of the
remainder of the lungs.

## 2024-04-23 ENCOUNTER — Ambulatory Visit (HOSPITAL_BASED_OUTPATIENT_CLINIC_OR_DEPARTMENT_OTHER): Admitting: Family
